# Patient Record
Sex: Female | Born: 1945 | Race: White | Marital: Married | Smoking: Former smoker
Health system: Southern US, Community
[De-identification: ages and names within clinical notes are randomized; demographics above are authoritative.]

## PROBLEM LIST (undated history)

## (undated) DIAGNOSIS — Z8489 Family history of other specified conditions: Secondary | ICD-10-CM

## (undated) DIAGNOSIS — F32A Depression, unspecified: Secondary | ICD-10-CM

## (undated) DIAGNOSIS — N3281 Overactive bladder: Secondary | ICD-10-CM

## (undated) DIAGNOSIS — F419 Anxiety disorder, unspecified: Secondary | ICD-10-CM

## (undated) DIAGNOSIS — K219 Gastro-esophageal reflux disease without esophagitis: Secondary | ICD-10-CM

## (undated) DIAGNOSIS — G4700 Insomnia, unspecified: Secondary | ICD-10-CM

## (undated) DIAGNOSIS — G4733 Obstructive sleep apnea (adult) (pediatric): Secondary | ICD-10-CM

## (undated) DIAGNOSIS — E039 Hypothyroidism, unspecified: Secondary | ICD-10-CM

## (undated) DIAGNOSIS — E559 Vitamin D deficiency, unspecified: Secondary | ICD-10-CM

## (undated) DIAGNOSIS — I10 Essential (primary) hypertension: Secondary | ICD-10-CM

## (undated) DIAGNOSIS — M1990 Unspecified osteoarthritis, unspecified site: Secondary | ICD-10-CM

## (undated) DIAGNOSIS — E785 Hyperlipidemia, unspecified: Secondary | ICD-10-CM

## (undated) HISTORY — DX: Anxiety disorder, unspecified: F41.9

## (undated) HISTORY — PX: EYE SURGERY: SHX253

## (undated) HISTORY — DX: Obstructive sleep apnea (adult) (pediatric): G47.33

## (undated) HISTORY — DX: Unspecified osteoarthritis, unspecified site: M19.90

## (undated) HISTORY — DX: Vitamin D deficiency, unspecified: E55.9

## (undated) HISTORY — DX: Hyperlipidemia, unspecified: E78.5

## (undated) HISTORY — DX: Essential (primary) hypertension: I10

## (undated) HISTORY — DX: Gastro-esophageal reflux disease without esophagitis: K21.9

## (undated) HISTORY — DX: Insomnia, unspecified: G47.00

## (undated) HISTORY — PX: COLONOSCOPY: SHX174

## (undated) HISTORY — PX: OTHER SURGICAL HISTORY: SHX169

---

## 1979-03-24 HISTORY — PX: TUBAL LIGATION: SHX77

## 1998-03-23 DIAGNOSIS — I10 Essential (primary) hypertension: Secondary | ICD-10-CM | POA: Insufficient documentation

## 1998-06-21 ENCOUNTER — Other Ambulatory Visit: Admission: RE | Admit: 1998-06-21 | Discharge: 1998-06-21 | Payer: Self-pay | Admitting: Obstetrics and Gynecology

## 1999-09-15 ENCOUNTER — Other Ambulatory Visit: Admission: RE | Admit: 1999-09-15 | Discharge: 1999-09-15 | Payer: Self-pay | Admitting: Obstetrics and Gynecology

## 2000-12-28 ENCOUNTER — Other Ambulatory Visit: Admission: RE | Admit: 2000-12-28 | Discharge: 2000-12-28 | Payer: Self-pay | Admitting: Family Medicine

## 2005-12-21 DIAGNOSIS — E782 Mixed hyperlipidemia: Secondary | ICD-10-CM | POA: Insufficient documentation

## 2005-12-21 DIAGNOSIS — E785 Hyperlipidemia, unspecified: Secondary | ICD-10-CM

## 2005-12-21 HISTORY — DX: Hyperlipidemia, unspecified: E78.5

## 2006-05-06 DIAGNOSIS — G4733 Obstructive sleep apnea (adult) (pediatric): Secondary | ICD-10-CM | POA: Insufficient documentation

## 2006-05-13 ENCOUNTER — Emergency Department: Payer: Self-pay | Admitting: Emergency Medicine

## 2006-05-14 ENCOUNTER — Other Ambulatory Visit: Payer: Self-pay

## 2006-05-14 IMAGING — CT CT CHEST W/ CM
2 of 3 series · 17 of 31 positions shown, 20 images · IV contrast (APPLIED)
Comparison: none

REASON FOR EXAM: Fever, Nonproductive Cough LE swelling
COMMENTS:

[Series 4: soft tissue · axial · 0.71mm/px · z∈[-317,-98]mm · 8 of 95 slices shown]
[im 11/95  mediastinal]
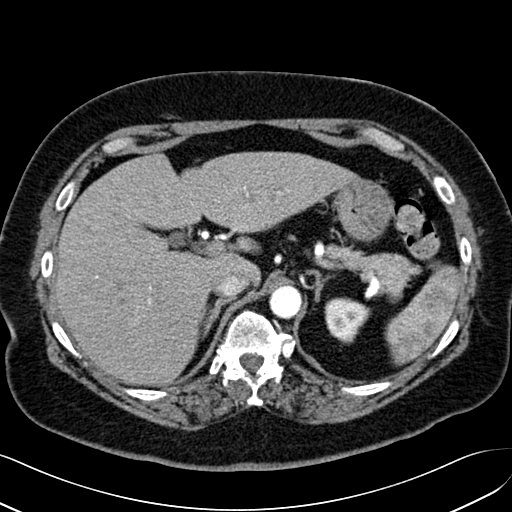
[im 21/95  mediastinal]
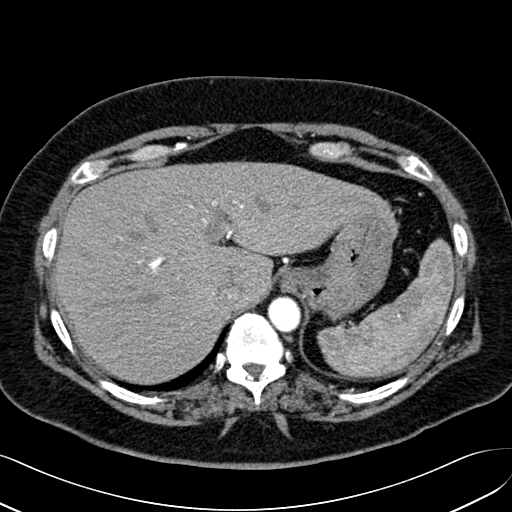
[im 32/95  mediastinal]
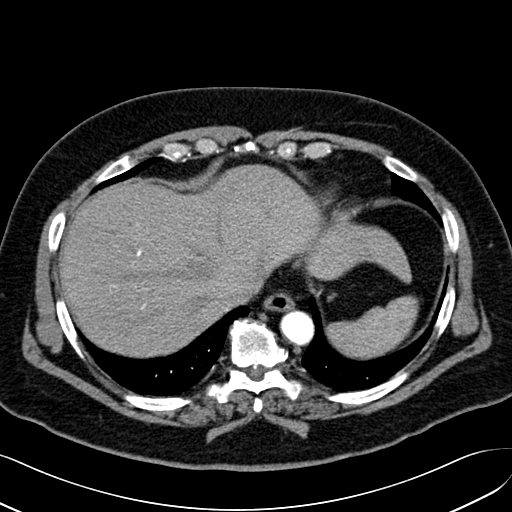
[im 42/95  mediastinal]
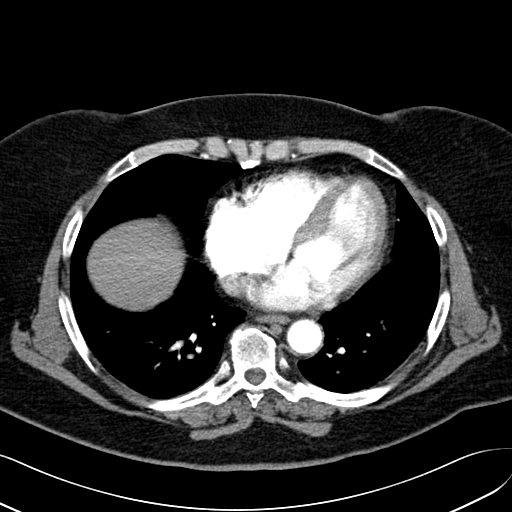
[im 53/95  mediastinal]
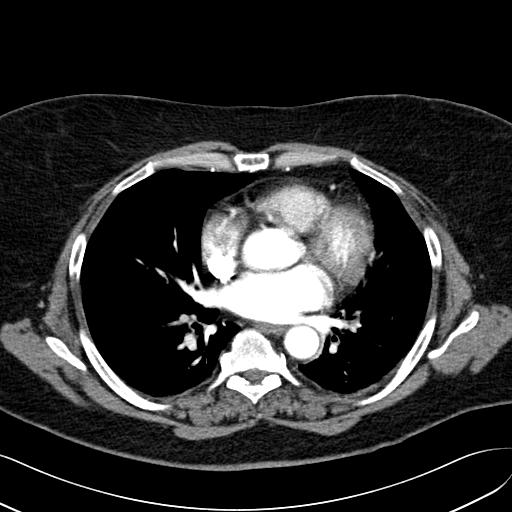
[im 63/95  mediastinal]
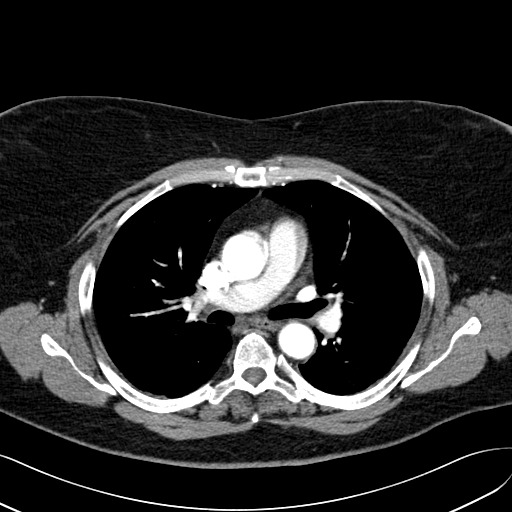
[im 74/95  mediastinal]
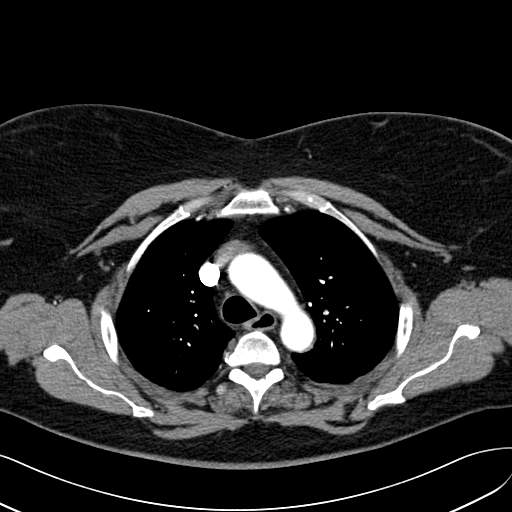
[im 84/95  mediastinal]
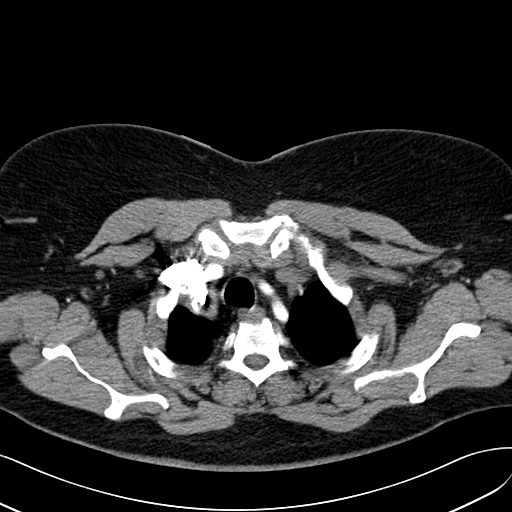

[Series 5: lung windows · axial · 0.71mm/px · z∈[-311,-98]mm · 9 of 93 slices shown, 12 images]
[im 11/93  mediastinal]
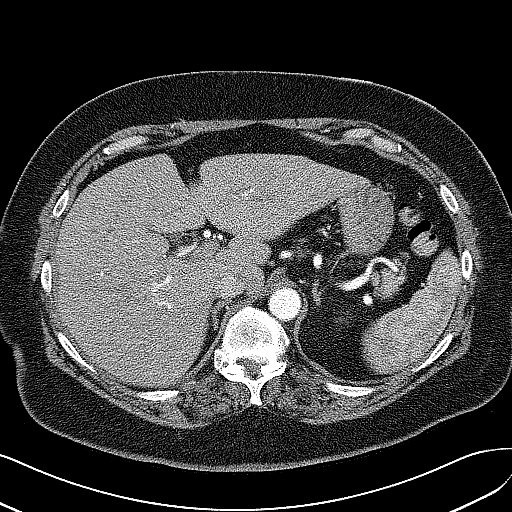
[im 11/93  lung]
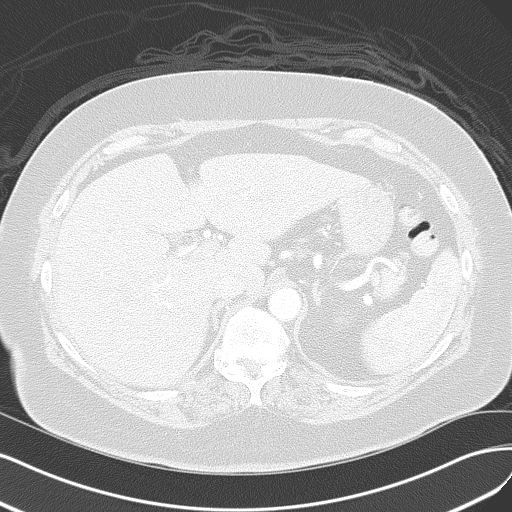
[im 21/93  lung]
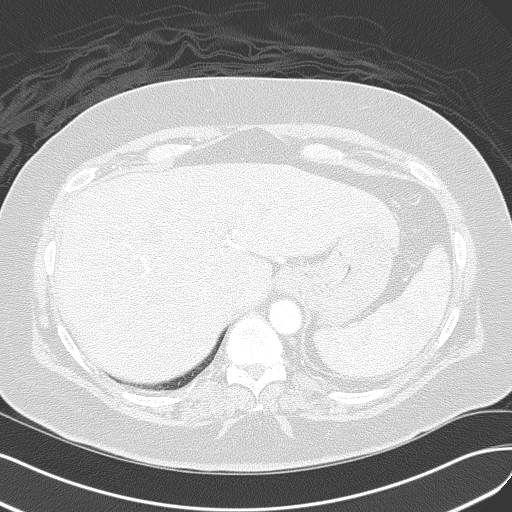
[im 31/93  lung]
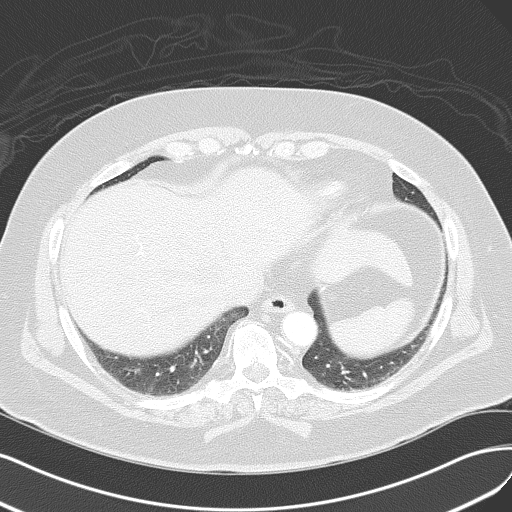
[im 41/93  lung]
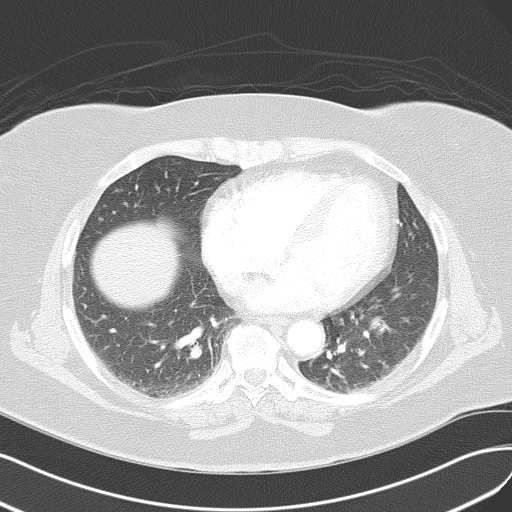
[im 43/93  mediastinal]
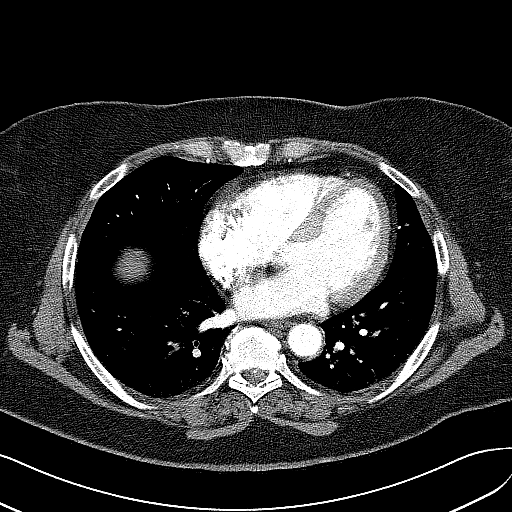
[im 43/93  lung]
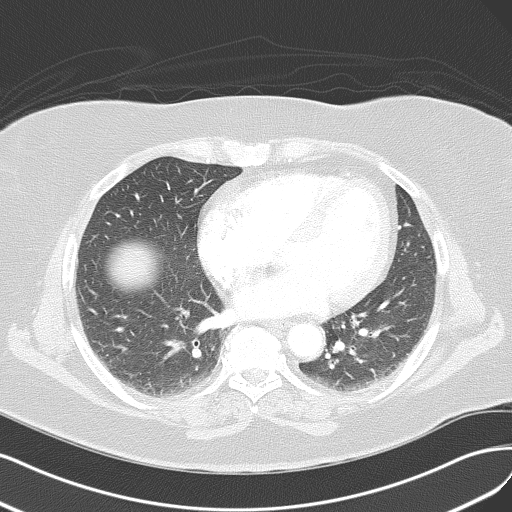
[im 52/93  lung]
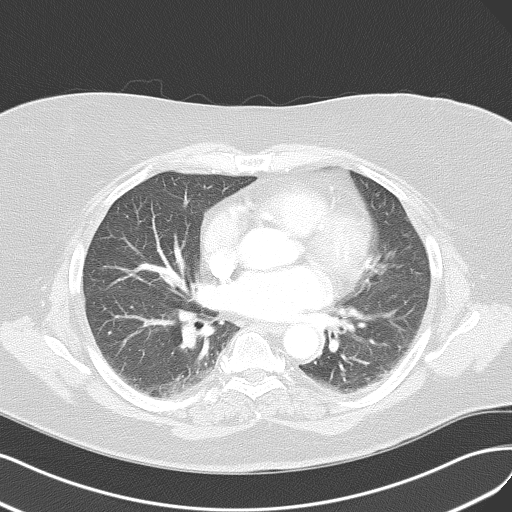
[im 62/93  lung]
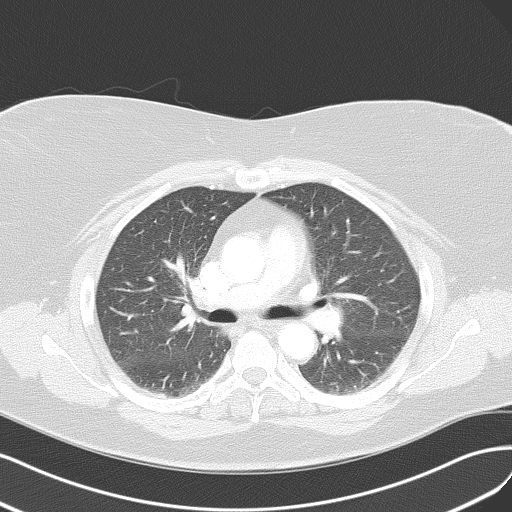
[im 72/93  lung]
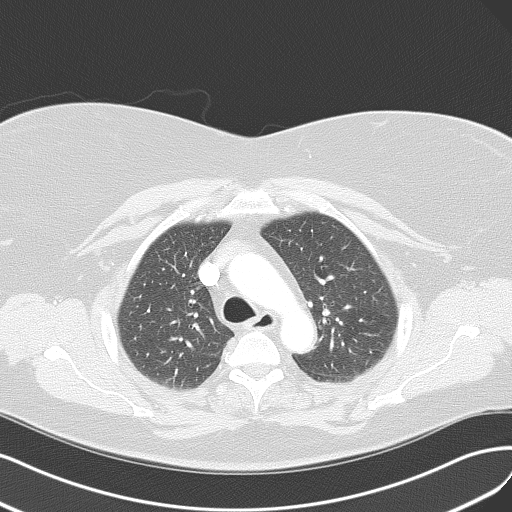
[im 82/93  mediastinal]
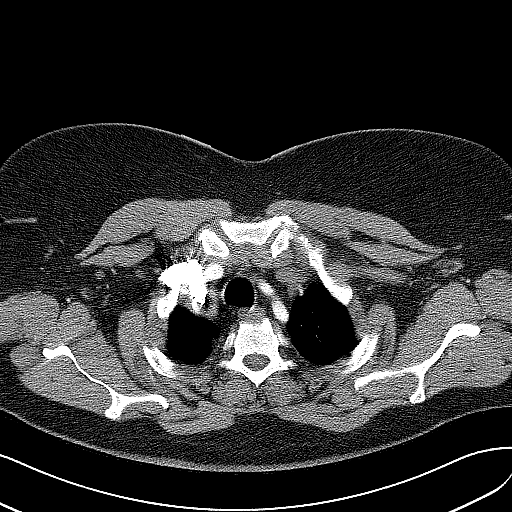
[im 82/93  lung]
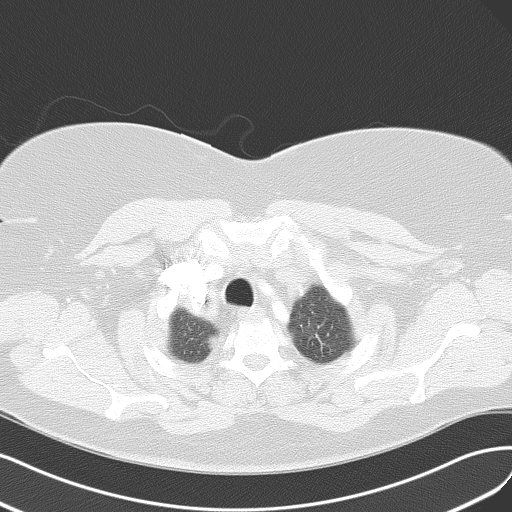

[17 of 31 positions shown; findings below may reference images not displayed]

PROCEDURE:     CT  - CT CHEST (FOR PE) W  - [DATE]  [DATE]

RESULT:     Emergent IV contrast-enhanced CT scan of the chest for pulmonary
embolism is performed. The study had to be repeated because of somewhat less
than optimal contrast bolus. The repeated images show normal opacification
of the pulmonary arteries. There is no pleural effusion. There is no
pericardial effusion. The upper abdominal viscera appear to be grossly
normal. The adrenal glands are not completely included. There is no
infiltrate, nodule or pneumothorax.
IMPRESSION: 1. No PE.
2. The thoracic aorta is unremarkable.

## 2006-05-14 IMAGING — US US EXTREM LOW VENOUS*L*
1 series · 17 of 24 positions shown · non-contrast
Comparison: none

REASON FOR EXAM: left leg pain
COMMENTS:

[Series 1: us extrem low venous*left* · 17 of 25 slices shown]
[im 1/25]
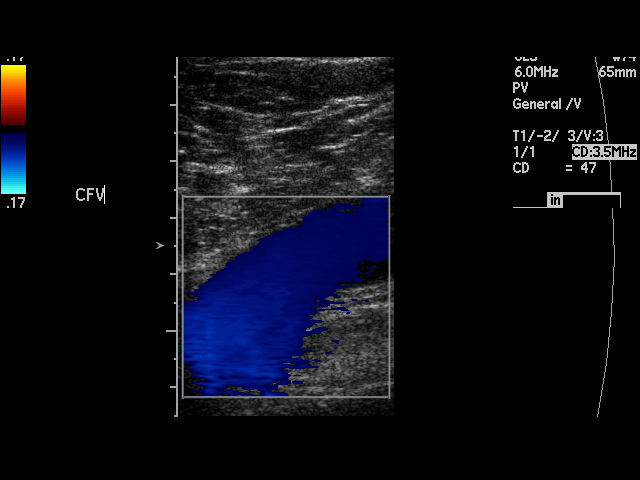
[im 3/25]
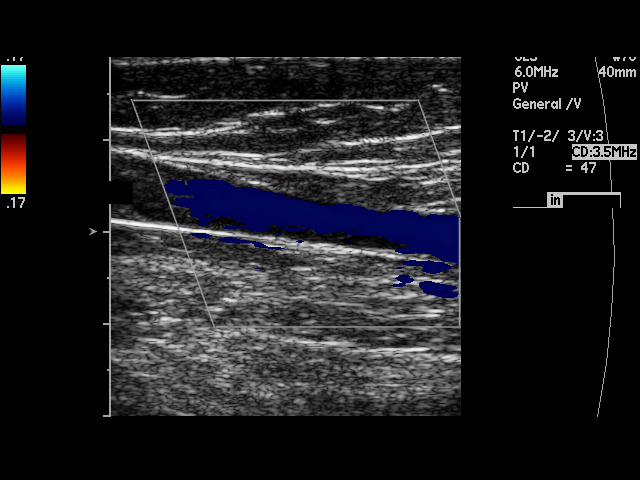
[im 4/25]
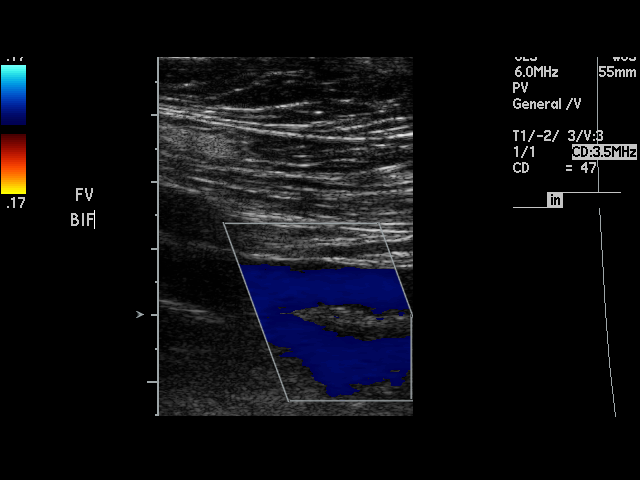
[im 5/25]
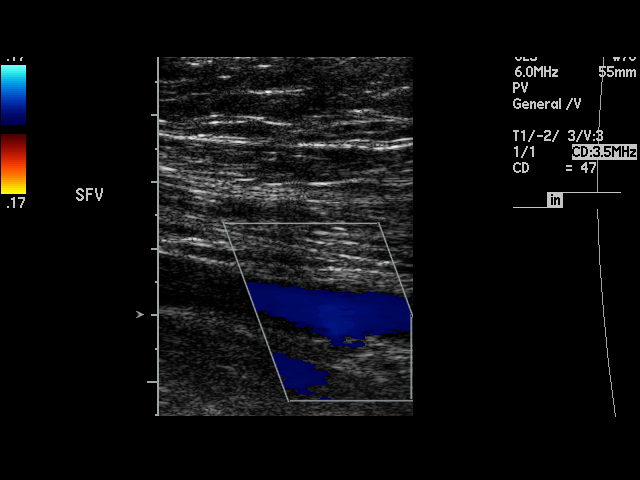
[im 7/25]
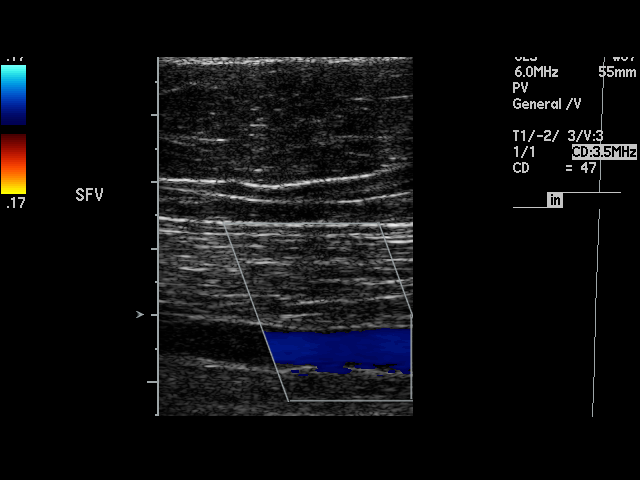
[im 8/25]
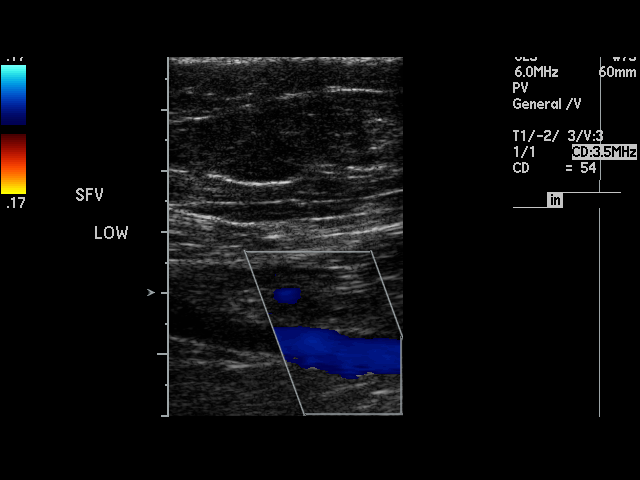
[im 10/25]
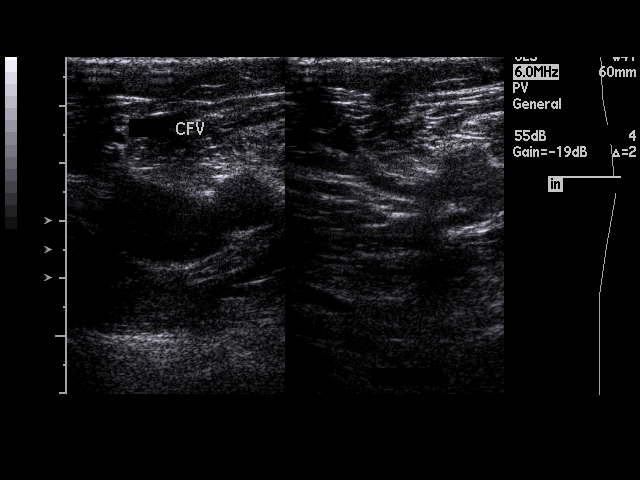
[im 11/25]
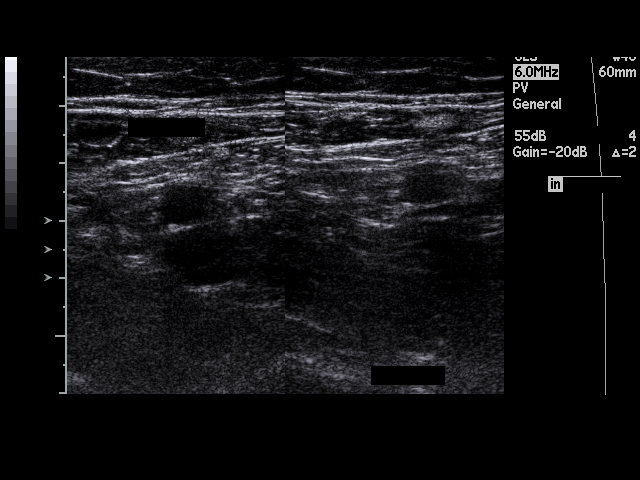
[im 13/25]
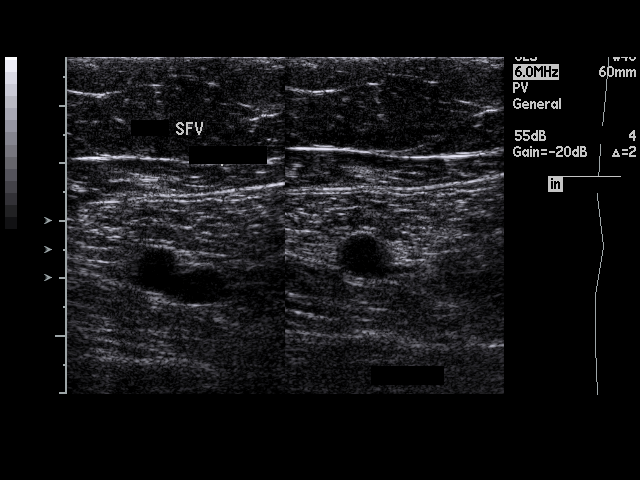
[im 14/25]
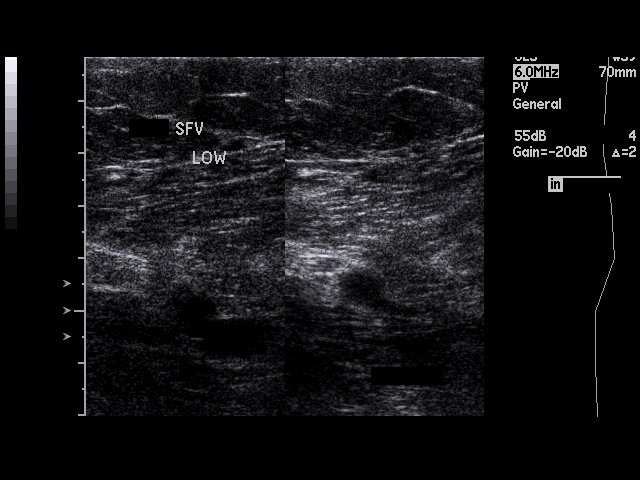
[im 15/25]
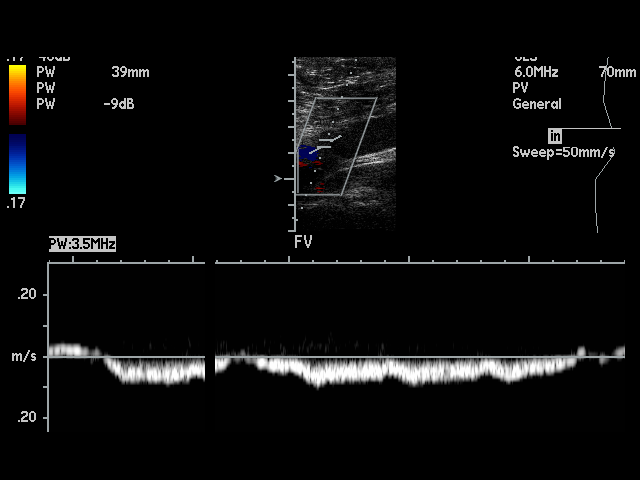
[im 17/25]
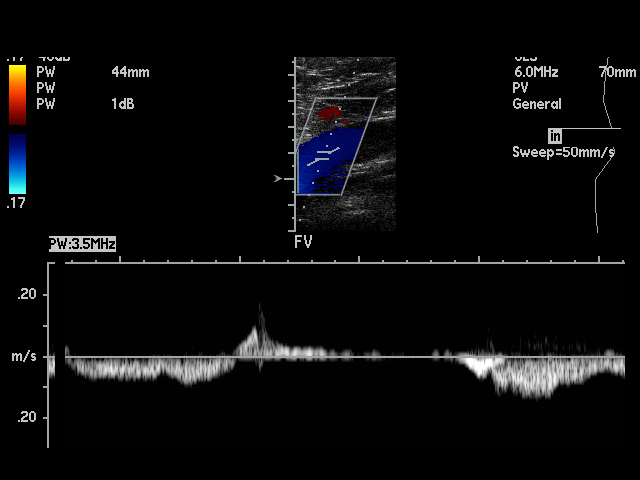
[im 18/25]
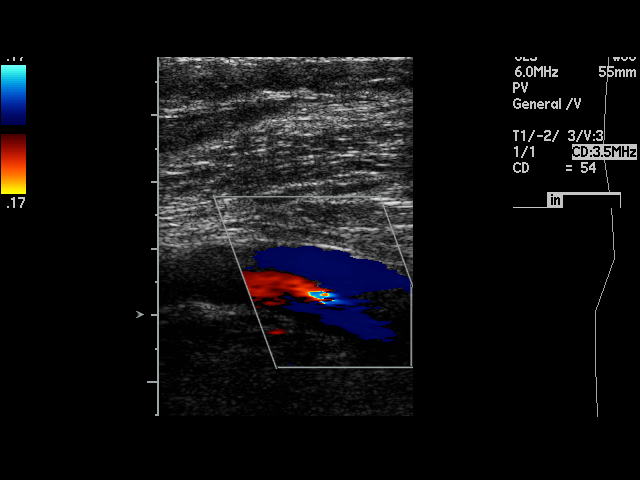
[im 20/25]
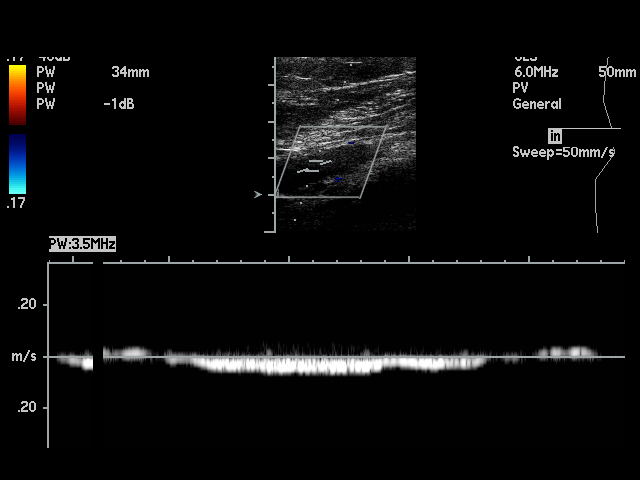
[im 21/25]
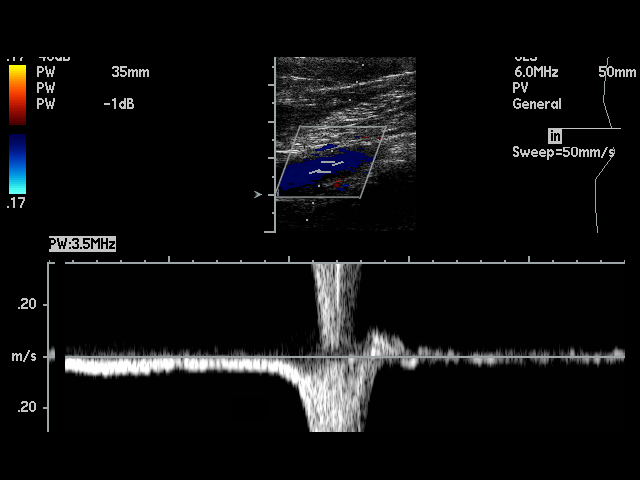
[im 22/25]
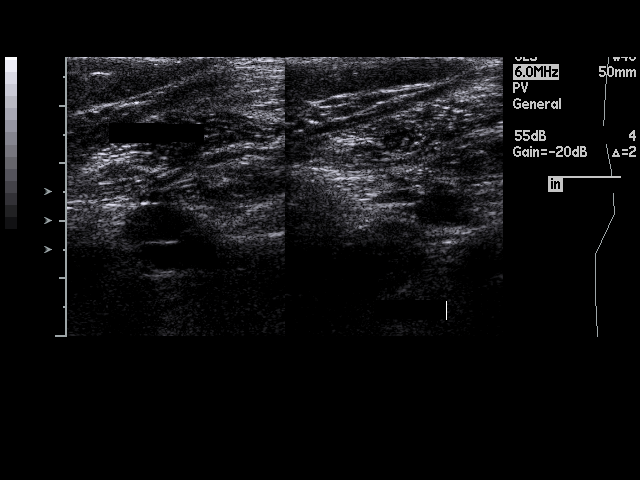
[im 25/25]
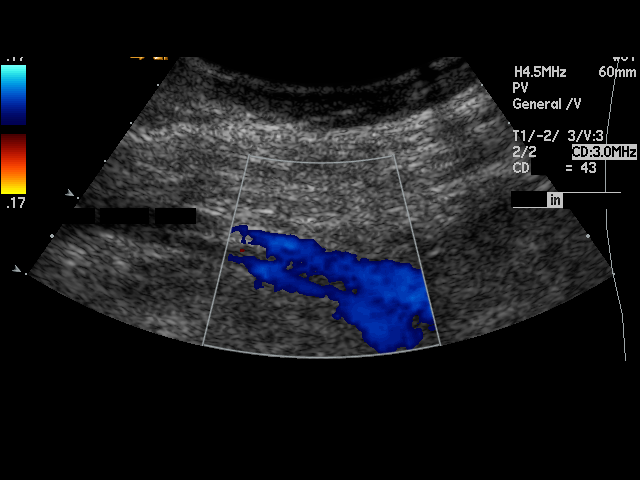

[17 of 24 positions shown; findings below may reference images not displayed]

PROCEDURE:     US  - US DOPPLER LOW EXTR LEFT  - [DATE]  [DATE]

RESULT:     Duplex Doppler interrogation of the deep venous system of the
left leg from the inguinal to the popliteal region shows normal
compressibility with a normal color and spectral Doppler appearance. There
are no findings to suggest a diagnosis of left lower extremity DVT.
IMPRESSION: 1. No DVT in the left lower extremity.

## 2007-02-09 DIAGNOSIS — E669 Obesity, unspecified: Secondary | ICD-10-CM | POA: Insufficient documentation

## 2007-02-12 DIAGNOSIS — Z8249 Family history of ischemic heart disease and other diseases of the circulatory system: Secondary | ICD-10-CM | POA: Insufficient documentation

## 2007-04-05 ENCOUNTER — Ambulatory Visit: Payer: Self-pay | Admitting: Family Medicine

## 2007-08-23 ENCOUNTER — Ambulatory Visit: Payer: Self-pay | Admitting: Family Medicine

## 2007-08-23 IMAGING — CT CT NECK-CHEST W/ CM
2 series · 10 of 14 positions shown, 12 images · non-contrast
Comparison: none

REASON FOR EXAM: right supraclavical nodule  HX axillary nodules on mammo
COMMENTS:

PROCEDURE:     CT  - CT NECK AND CHEST W  - [DATE]  [DATE]
RESULT:     Comparison: Chest CT on [DATE].
TECHNIQUE: CT examination of the neck and chest was performed after
intravenous administration of 80 ml of [UB] nonionic contrast.
Collimation is 3 mm.

[Series 2: soft tissue · axial · 0.69mm/px · z∈[+406,+748]mm · 7 of 153 slices shown, 9 images]
[im 20/153  soft-tissue]
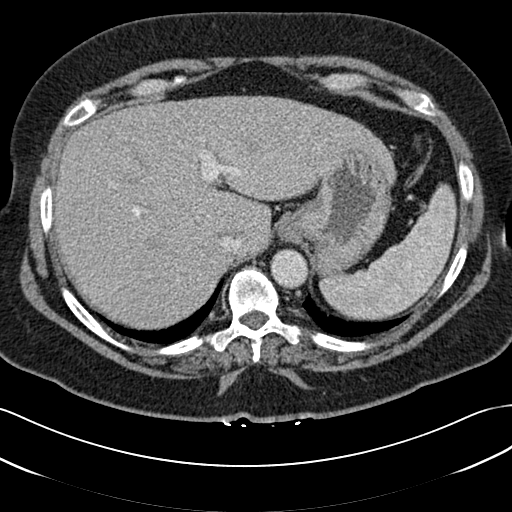
[im 20/153  bone]
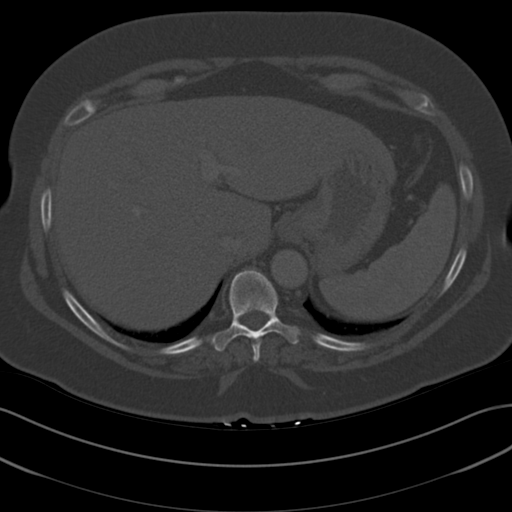
[im 39/153  bone]
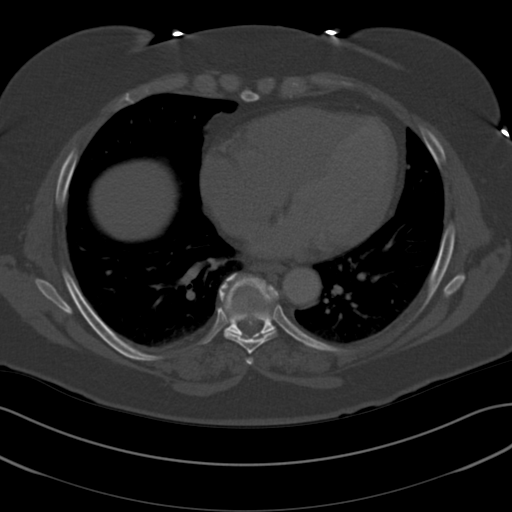
[im 58/153  bone]
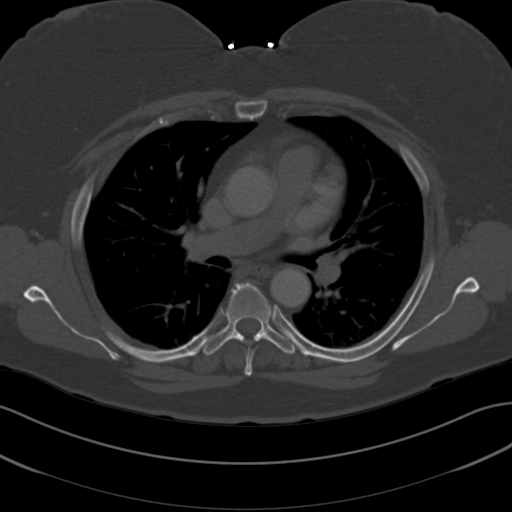
[im 77/153  bone]
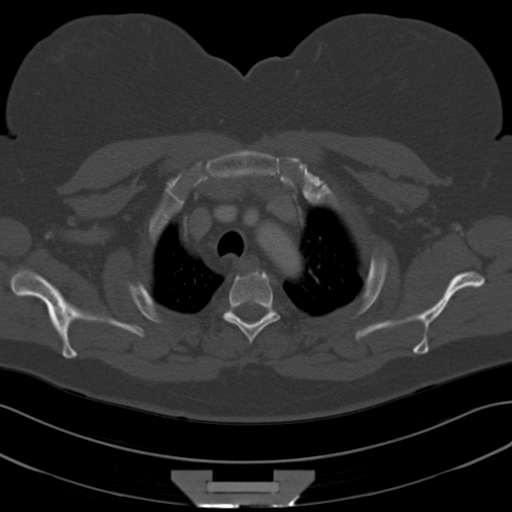
[im 96/153  soft-tissue]
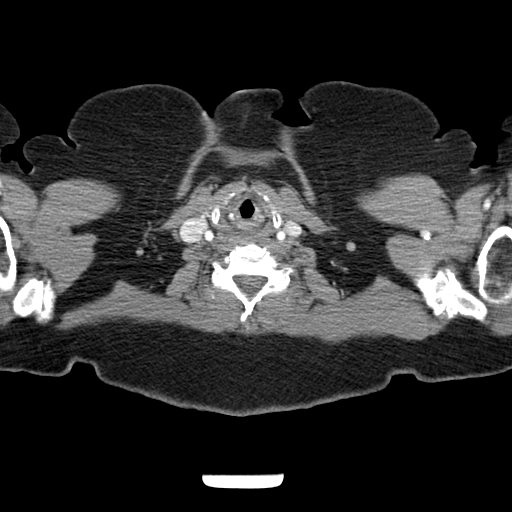
[im 96/153  bone]
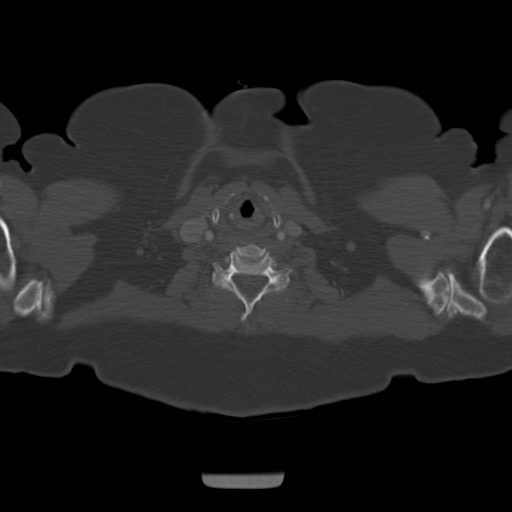
[im 115/153  bone]
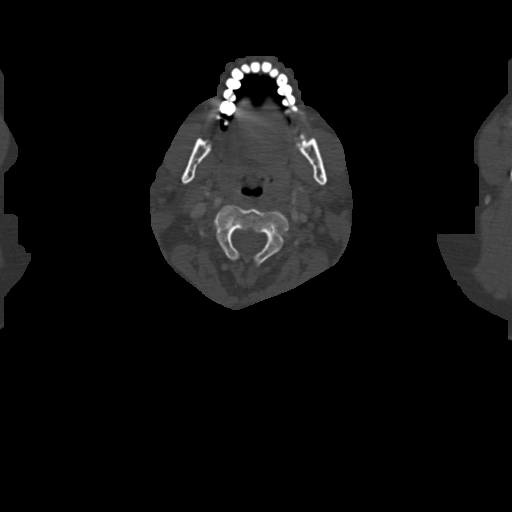
[im 134/153  bone]
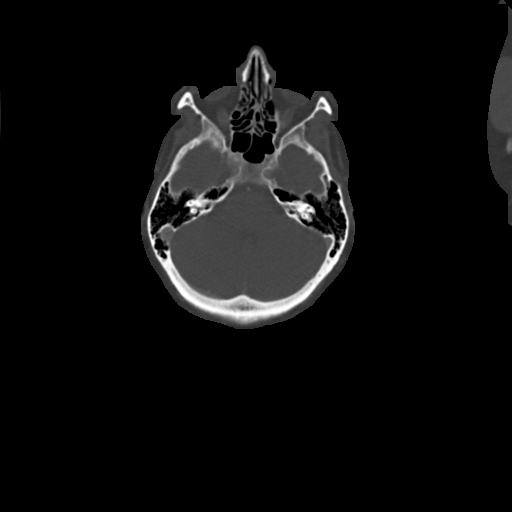

[Series 4: lung windows · axial · 0.69mm/px · z∈[+417,+537]mm · 3 of 82 slices shown]
[im 21/82  bone]
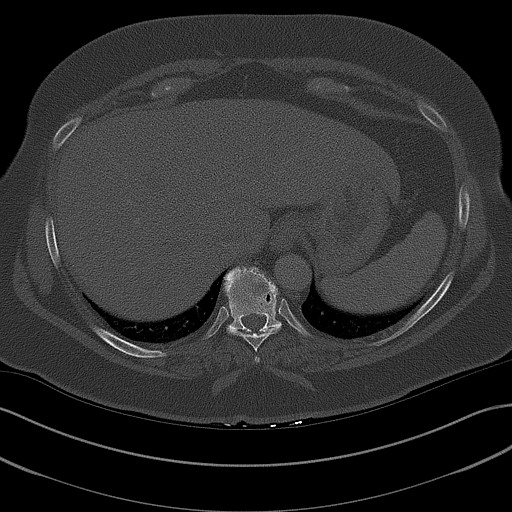
[im 41/82  bone]
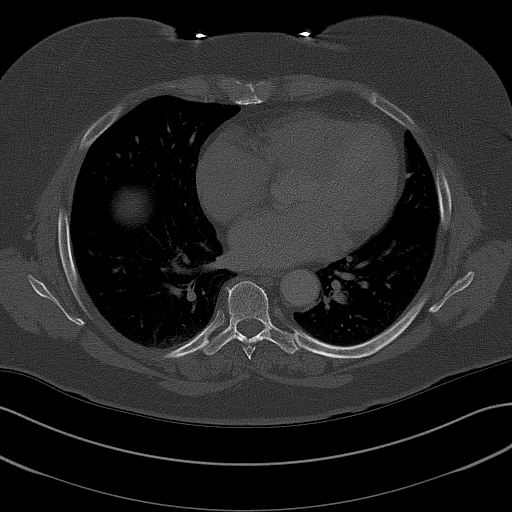
[im 61/82  bone]
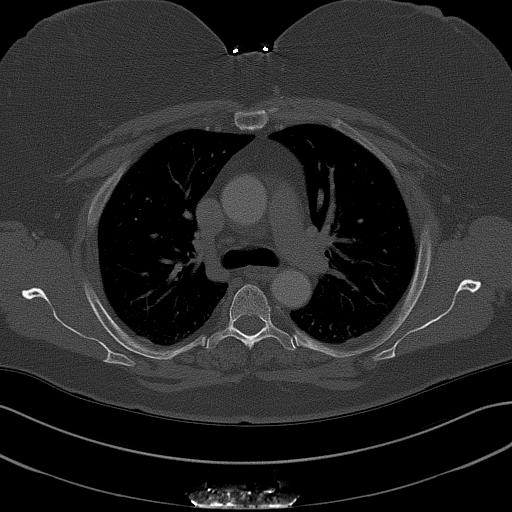

[10 of 14 positions shown; findings below may reference images not displayed]

FINDINGS: Neck:

No neck mass is seen. The naso and oropharyngeal mucosal spaces are
unremarkable.  There is no abnormal fluid collection.There are no enlarged
neck lymph nodes. The paranasal sinuses and mastoid air cells are
unremarkable.

Visualized portions of the carotid arteries and internal jugular veins are
patent.

Chest:

The lungs are clear. There is no pleural or pericardial effusion. There is
no pneumothorax. There are no enlarged mediastinal, hilar, or axillary lymph
nodes.

No grossly suspicious lytic or blastic osseous lesion is seen. Mild to
moderate degenerative changes are seen involving the visualized cervical and
thoracic spine as well as the bilateral AC joints.
IMPRESSION: 1. No neck or thoracic lymphadenopathy. No neck mass is seen.

2. Mild to moderate degenerative changes are seen involving the visualized
cervical and thoracic spine. Multilevel posterior spurring involving the mid
to lower thoracic spine likely causes some canal narrowing.

## 2009-04-17 ENCOUNTER — Ambulatory Visit: Payer: Self-pay

## 2010-06-14 ENCOUNTER — Ambulatory Visit: Payer: Self-pay | Admitting: Family Medicine

## 2010-06-14 IMAGING — CR DG SHOULDER 3+V*R*
1 series · 4 of 4 positions shown · non-contrast
Comparison: none

REASON FOR EXAM: pain in rt shld after fall
COMMENTS:

PROCEDURE:     DXR - DXR SHOULDER RIGHT COMPLETE  - [DATE] [DATE]
RESULT:     Comparison: None.

[Series 1: view not recorded · 0.17mm/px · 4 of 4 slices shown]
[im 1/4]
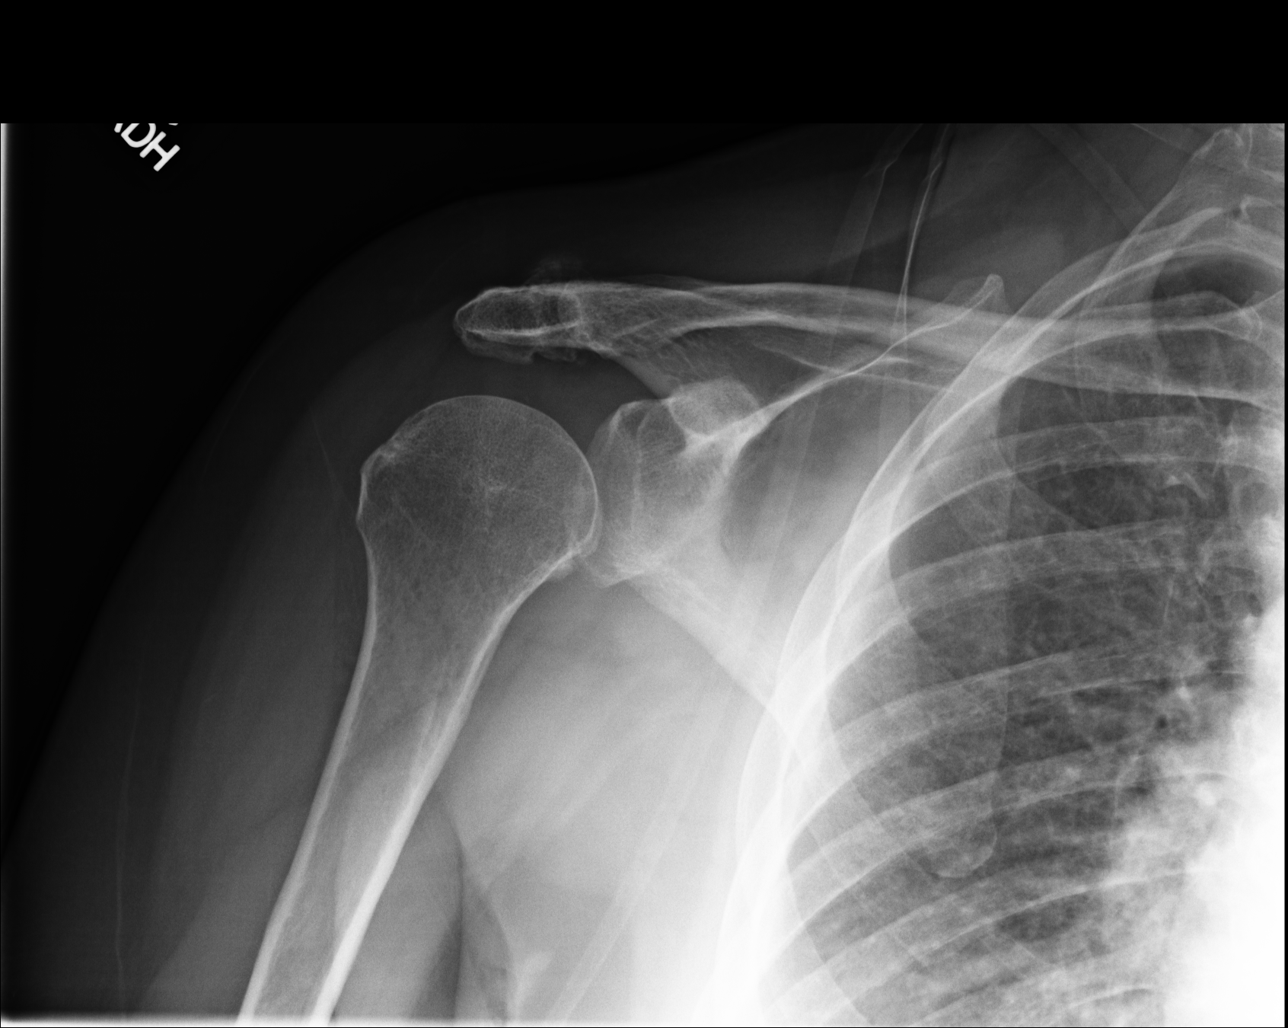
[im 2/4]
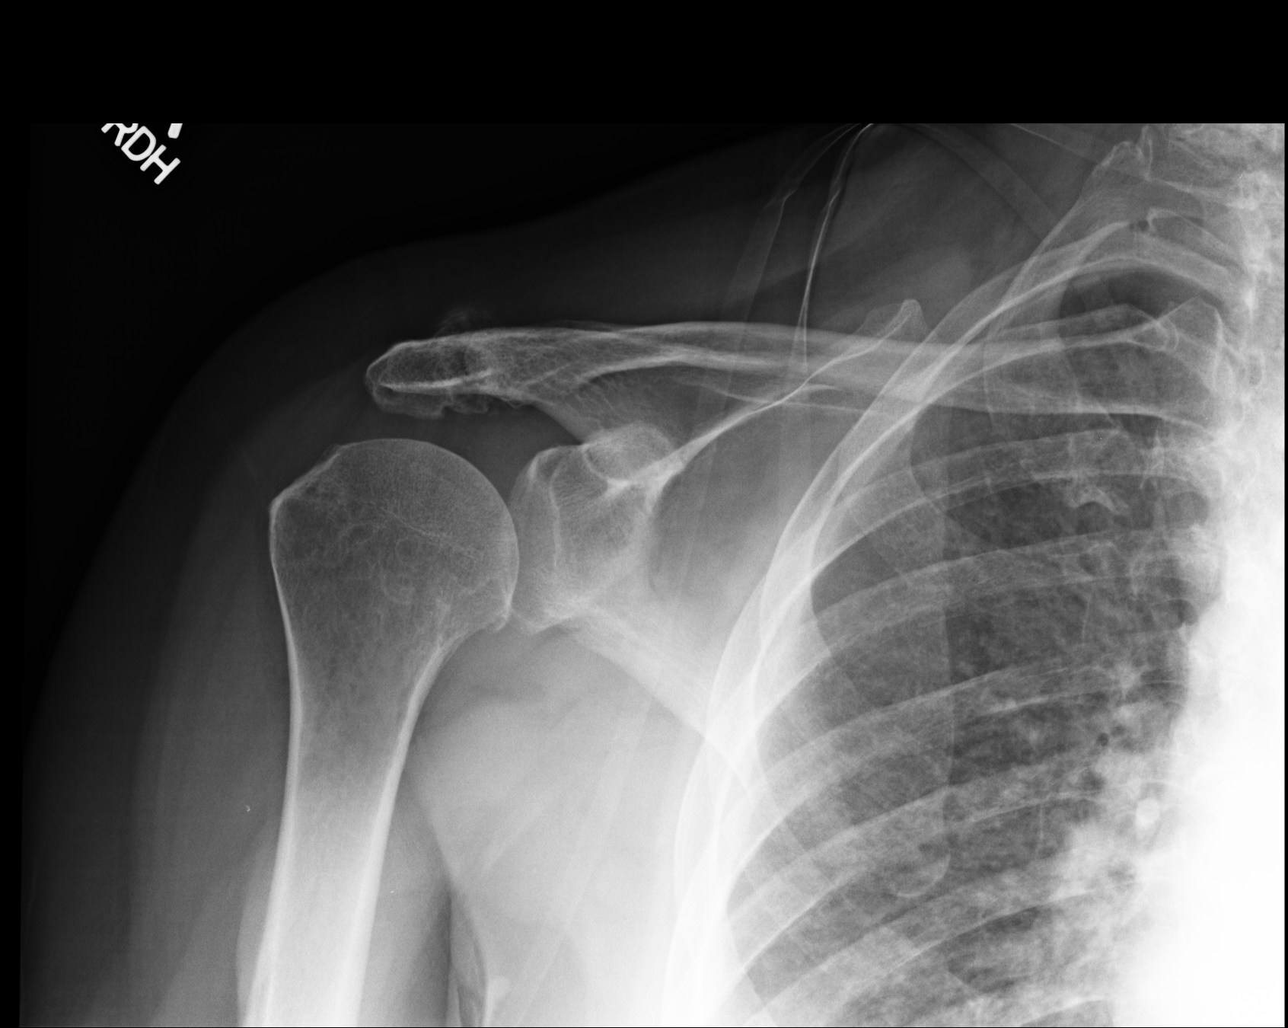
[im 3/4]
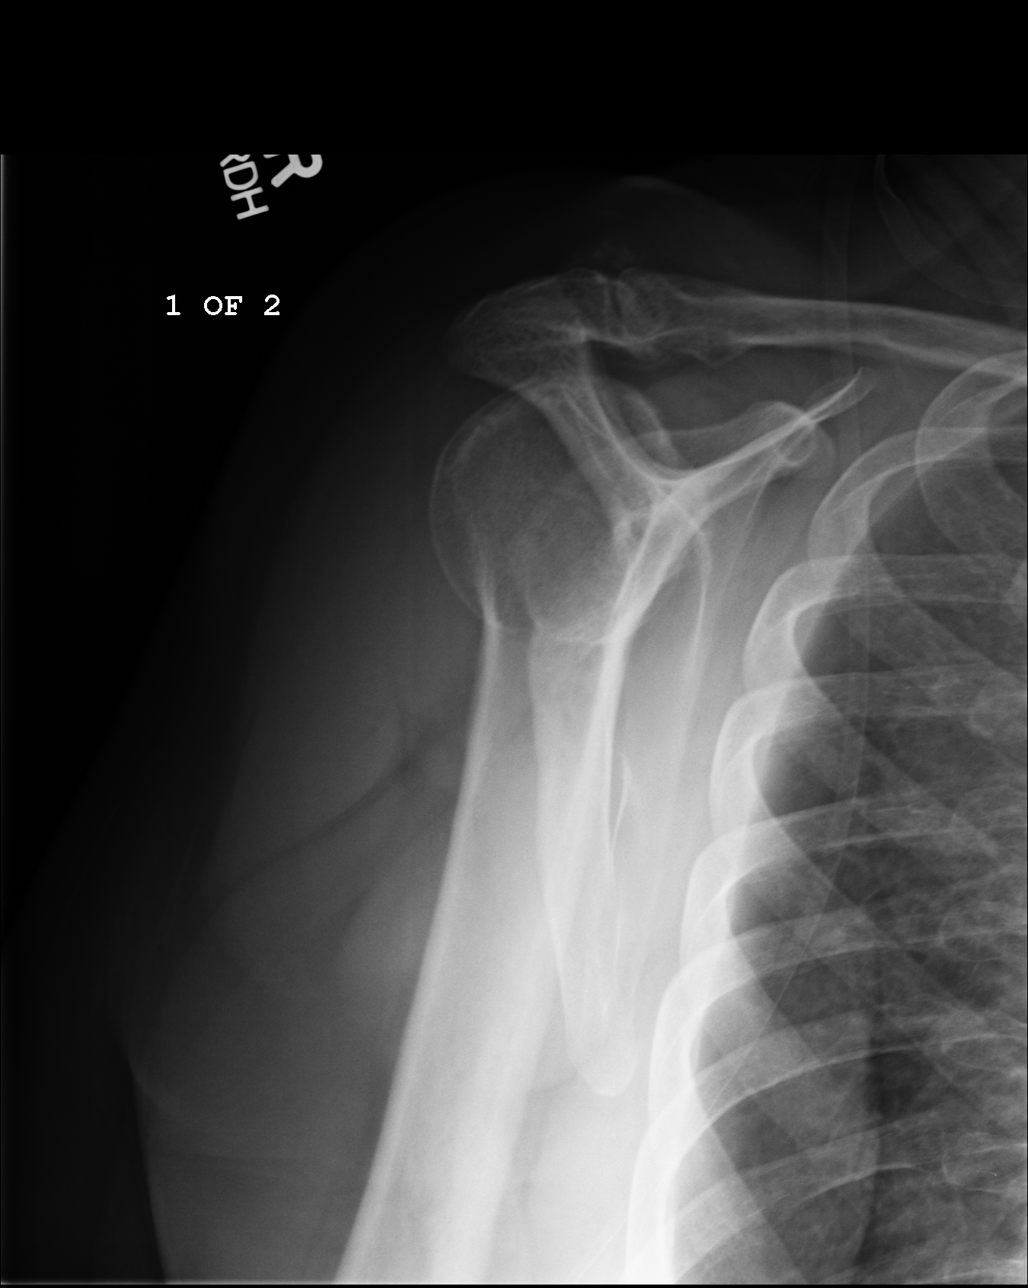
[im 4/4]
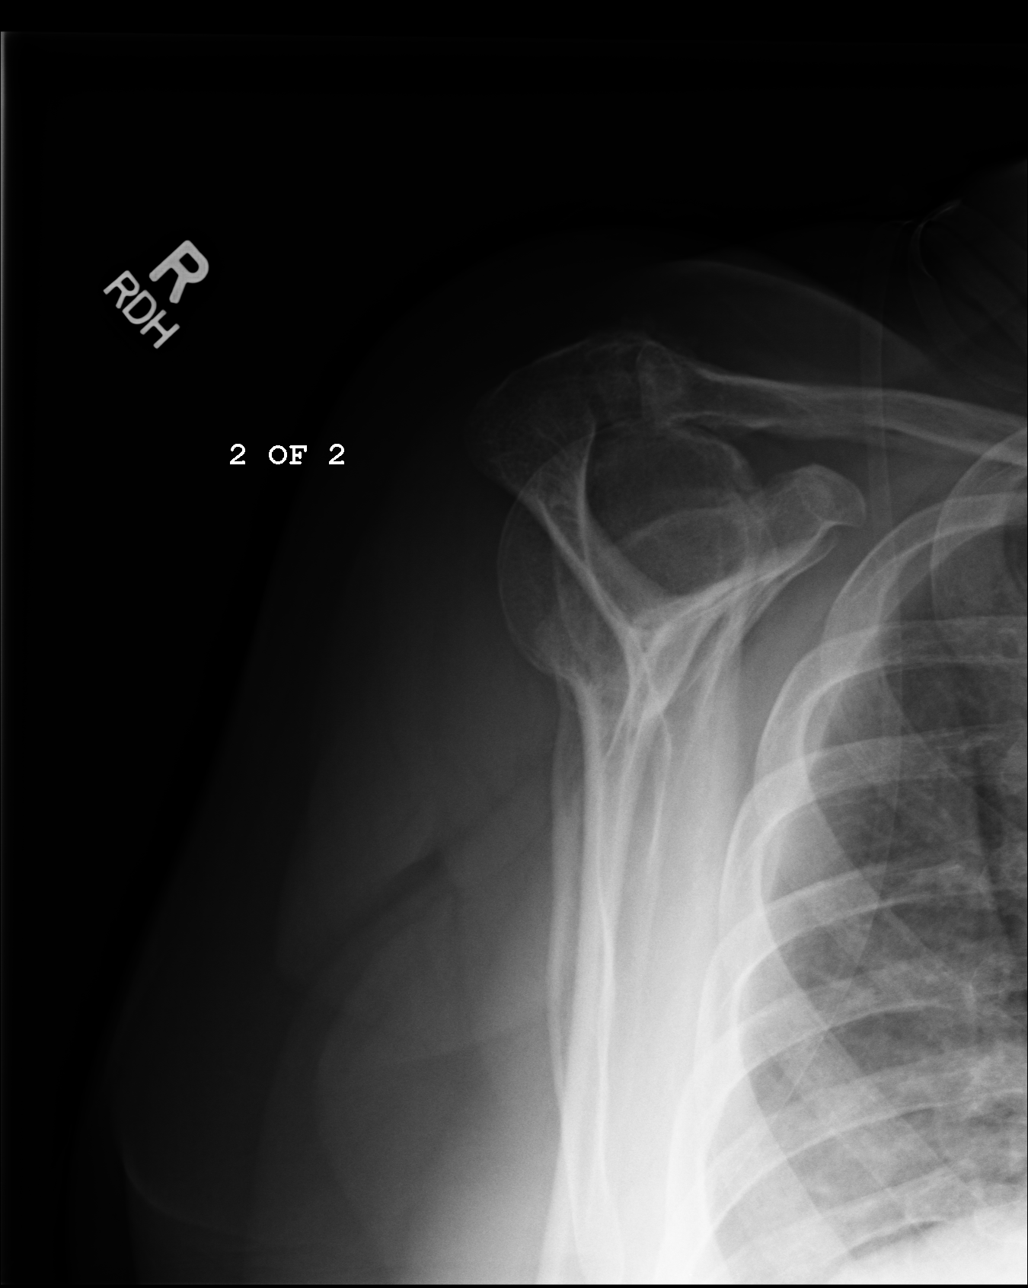

[4 of 4 positions shown; findings below may reference images not displayed]

FINDINGS: No acute fracture or dislocation. There is mild degenerative change of the
acromioclavicular and glenohumeral joints.
IMPRESSION: No acute fracture.

## 2010-07-15 ENCOUNTER — Ambulatory Visit: Payer: Self-pay | Admitting: Ophthalmology

## 2010-08-12 ENCOUNTER — Ambulatory Visit: Payer: Self-pay | Admitting: Ophthalmology

## 2010-12-11 ENCOUNTER — Ambulatory Visit: Payer: Self-pay

## 2011-05-01 ENCOUNTER — Ambulatory Visit: Payer: Self-pay | Admitting: Family Medicine

## 2012-05-02 ENCOUNTER — Inpatient Hospital Stay: Payer: Self-pay | Admitting: Internal Medicine

## 2012-05-02 LAB — COMPREHENSIVE METABOLIC PANEL
Albumin: 3.4 g/dL (ref 3.4–5.0)
Alkaline Phosphatase: 100 U/L (ref 50–136)
Anion Gap: 8 (ref 7–16)
Bilirubin,Total: 0.6 mg/dL (ref 0.2–1.0)
Creatinine: 0.69 mg/dL (ref 0.60–1.30)
EGFR (Non-African Amer.): >60
Glucose: 94 mg/dL (ref 65–99)
Osmolality: 275 (ref 275–301)
Potassium: 4 mmol/L (ref 3.5–5.1)
SGOT(AST): 27 U/L (ref 15–37)
SGPT (ALT): 28 U/L (ref 12–78)
Sodium: 137 mmol/L (ref 136–145)
Total Protein: 7.6 g/dL (ref 6.4–8.2)

## 2012-05-02 LAB — URINALYSIS, COMPLETE
Bilirubin,UR: NEGATIVE
Glucose,UR: NEGATIVE mg/dL (ref 0–75)
Ketone: NEGATIVE
RBC,UR: 385 /HPF (ref 0–5)
Squamous Epithelial: 6
WBC UR: 1596 /HPF (ref 0–5)

## 2012-05-02 LAB — CBC
MCH: 32.5 pg (ref 26.0–34.0)
MCHC: 34.2 g/dL (ref 32.0–36.0)
Platelet: 183 10*3/uL (ref 150–440)
RDW: 13.8 % (ref 11.5–14.5)

## 2012-05-02 LAB — RAPID INFLUENZA A&B ANTIGENS

## 2012-05-02 LAB — URIC ACID: Uric Acid: 3.7 mg/dL (ref 2.6–6.0)

## 2012-05-02 IMAGING — CR DG CHEST 2V
1 series · 2 of 2 positions shown · non-contrast
Comparison: none

REASON FOR EXAM: cough
COMMENTS:   May transport without cardiac monitor

PROCEDURE:     DXR - DXR CHEST PA (OR AP) AND LATERAL  - [DATE]  [DATE]
RESULT:     Comparison: None.

[Series 1: ap · 0.17mm/px · 2 of 2 slices shown]
[im 1/2]
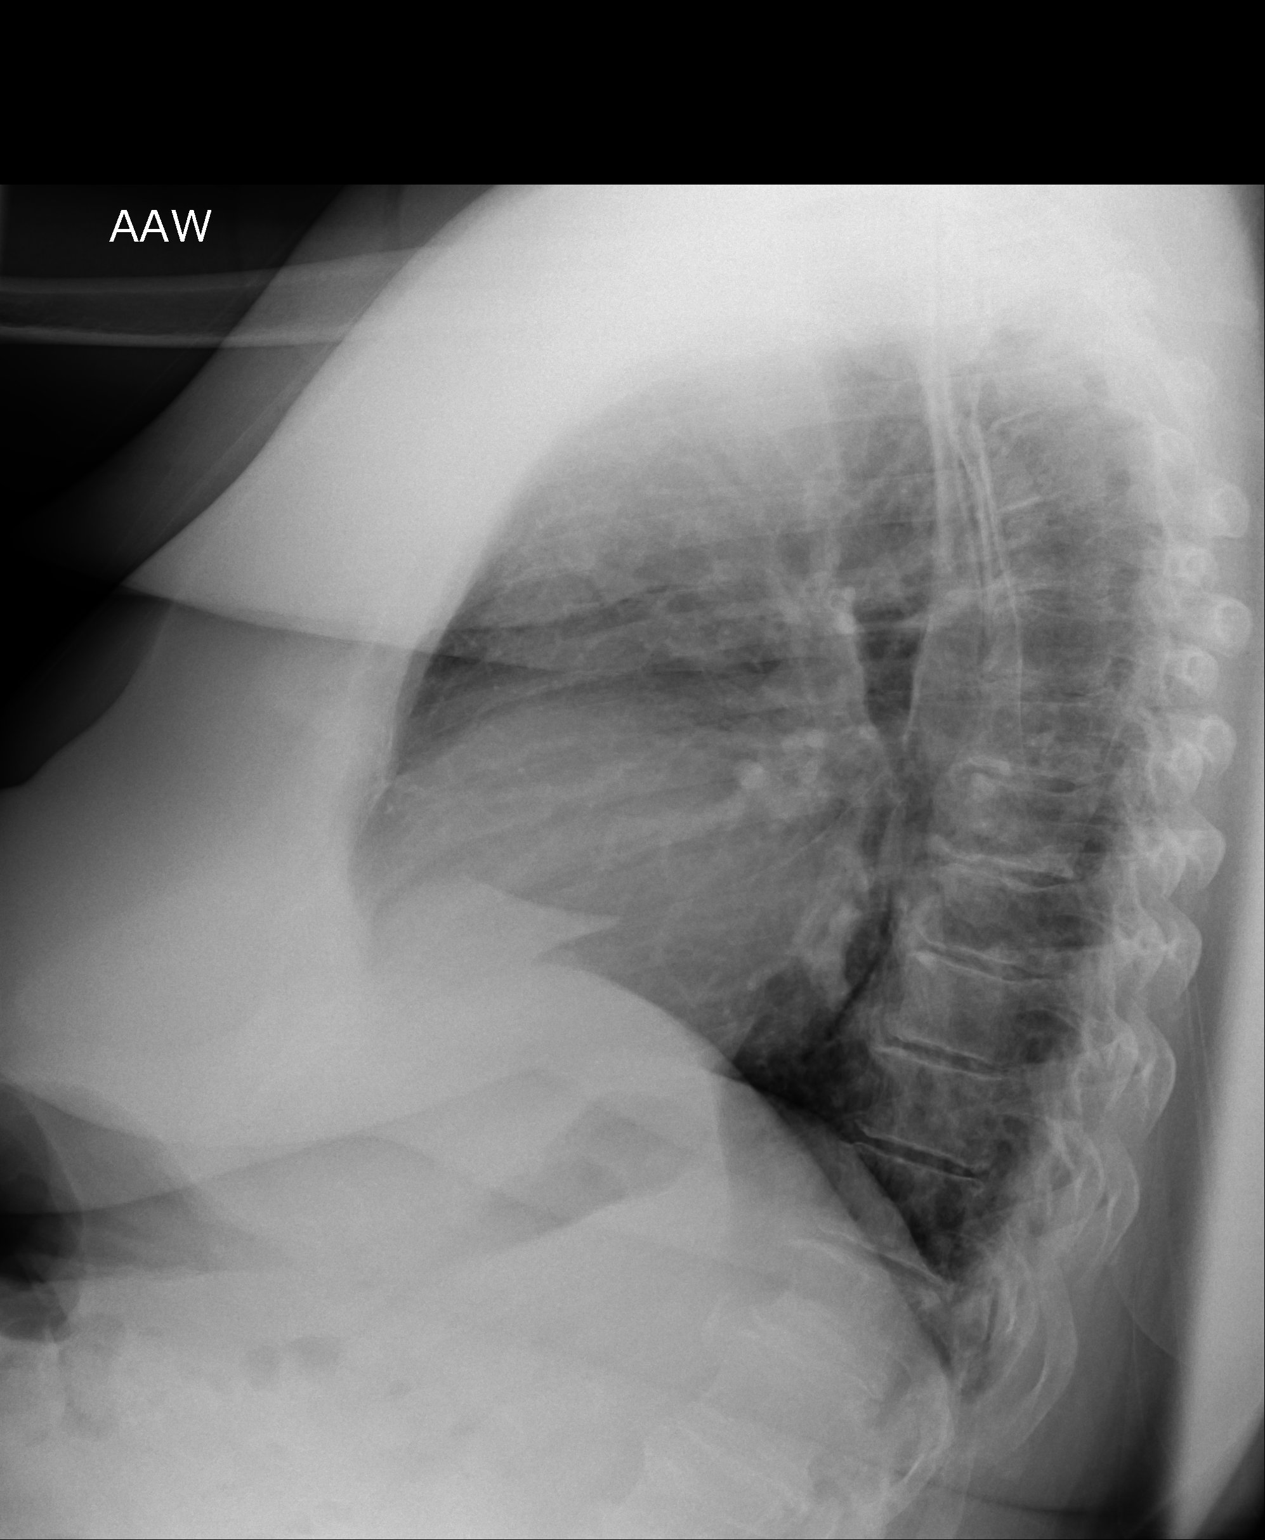
[im 2/2]
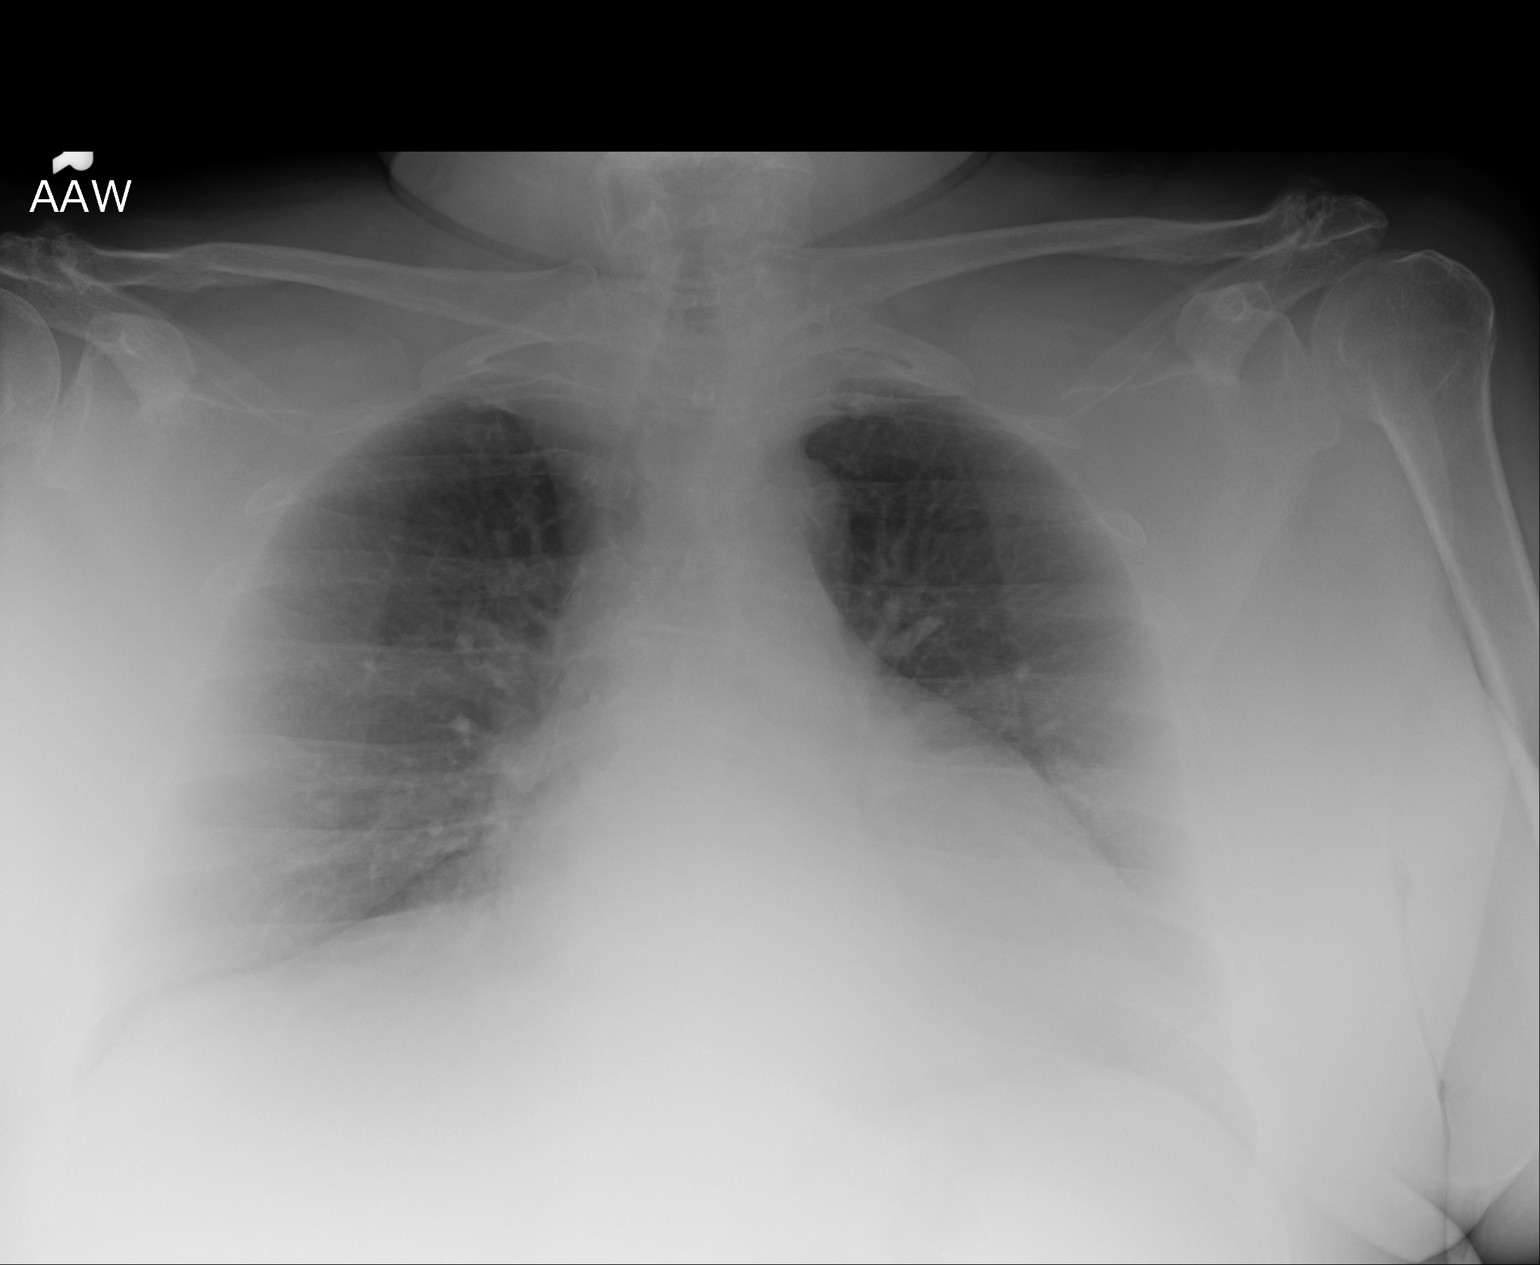

[2 of 2 positions shown; findings below may reference images not displayed]

FINDINGS: The heart is enlarged. The lung volumes are low. This is a relatively
lordotic view. No focal pulmonary opacities.
IMPRESSION: Slightly limited examination. Otherwise, no acute cardiopulmonary disease.

[REDACTED]

## 2012-05-02 IMAGING — CR DG KNEE COMPLETE 4+V*R*
1 series · 4 of 4 positions shown · non-contrast
Comparison: none

REASON FOR EXAM: severe knee pain
COMMENTS:

PROCEDURE:     DXR - DXR KNEE RT COMP WITH OBLIQUES  - [DATE]  [DATE]
RESULT:     Comparison: None.

[Series 1: oblique · 0.17mm/px · 4 of 4 slices shown]
[im 1/4]
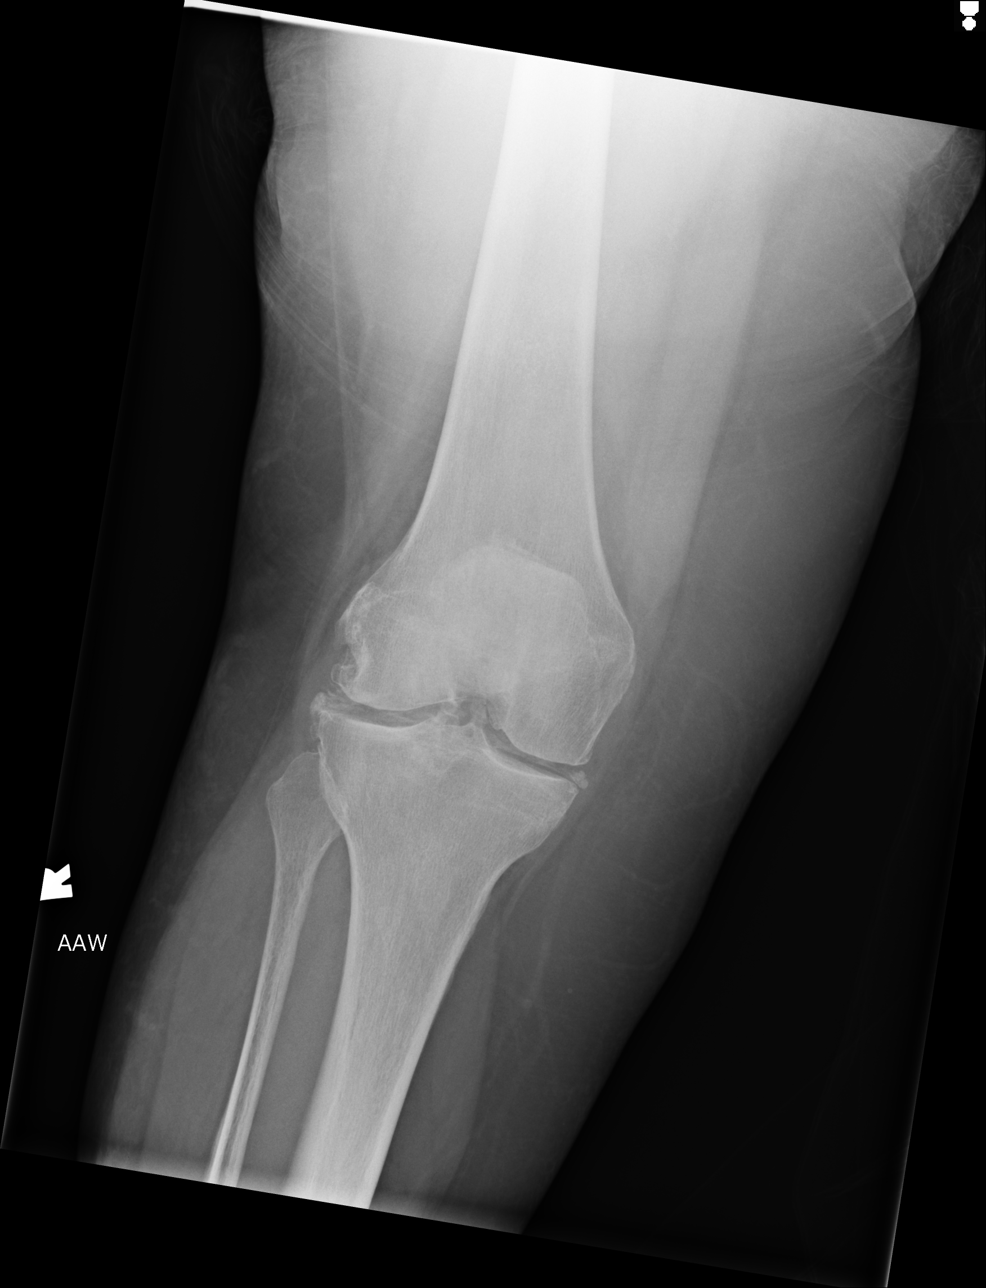
[im 2/4]
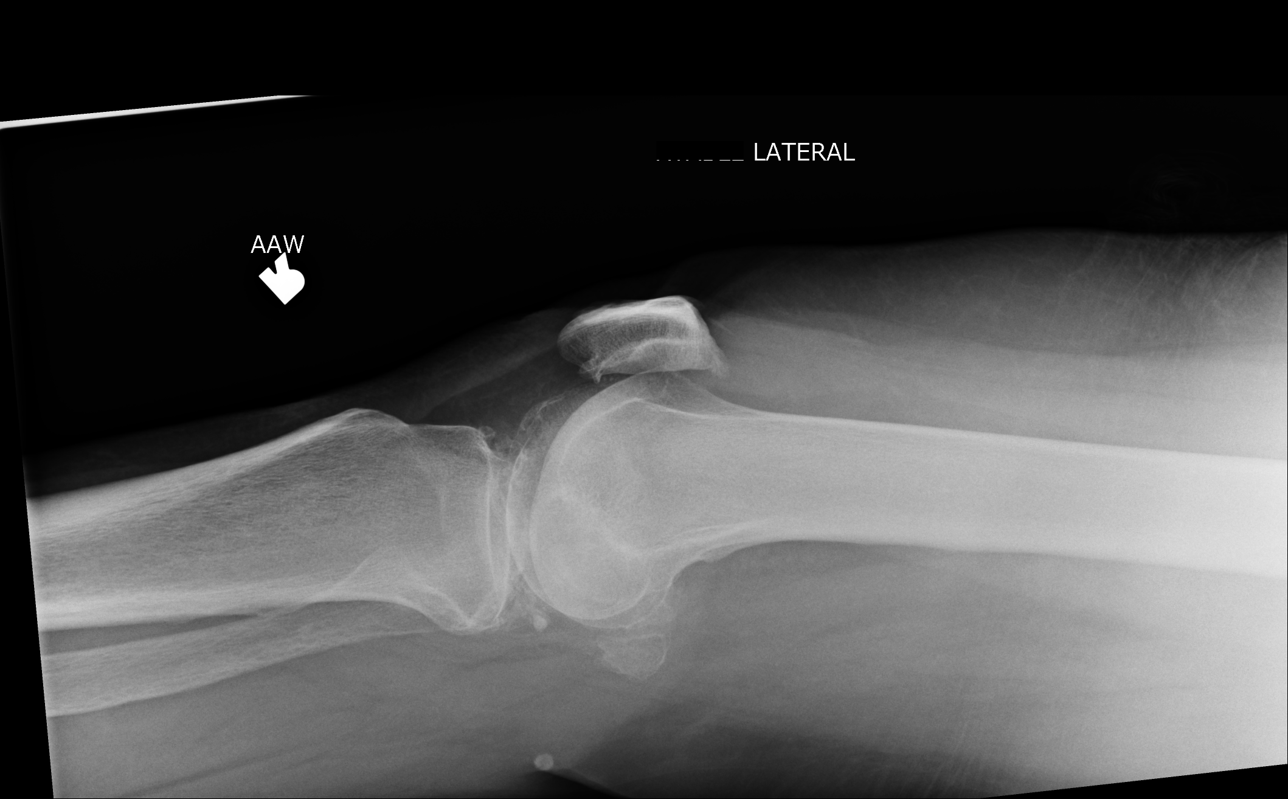
[im 3/4]
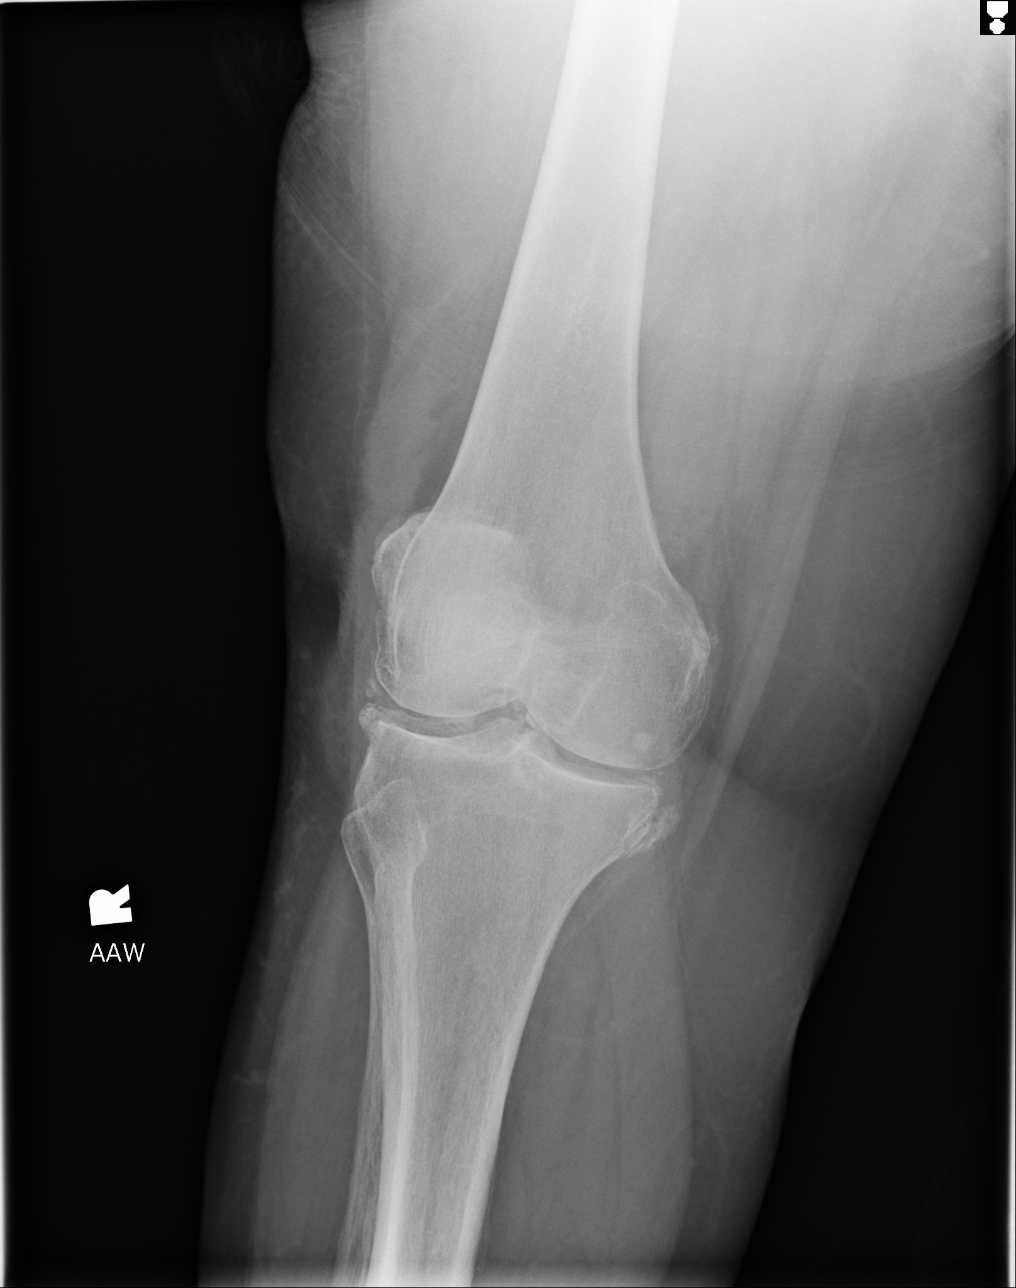
[im 4/4]
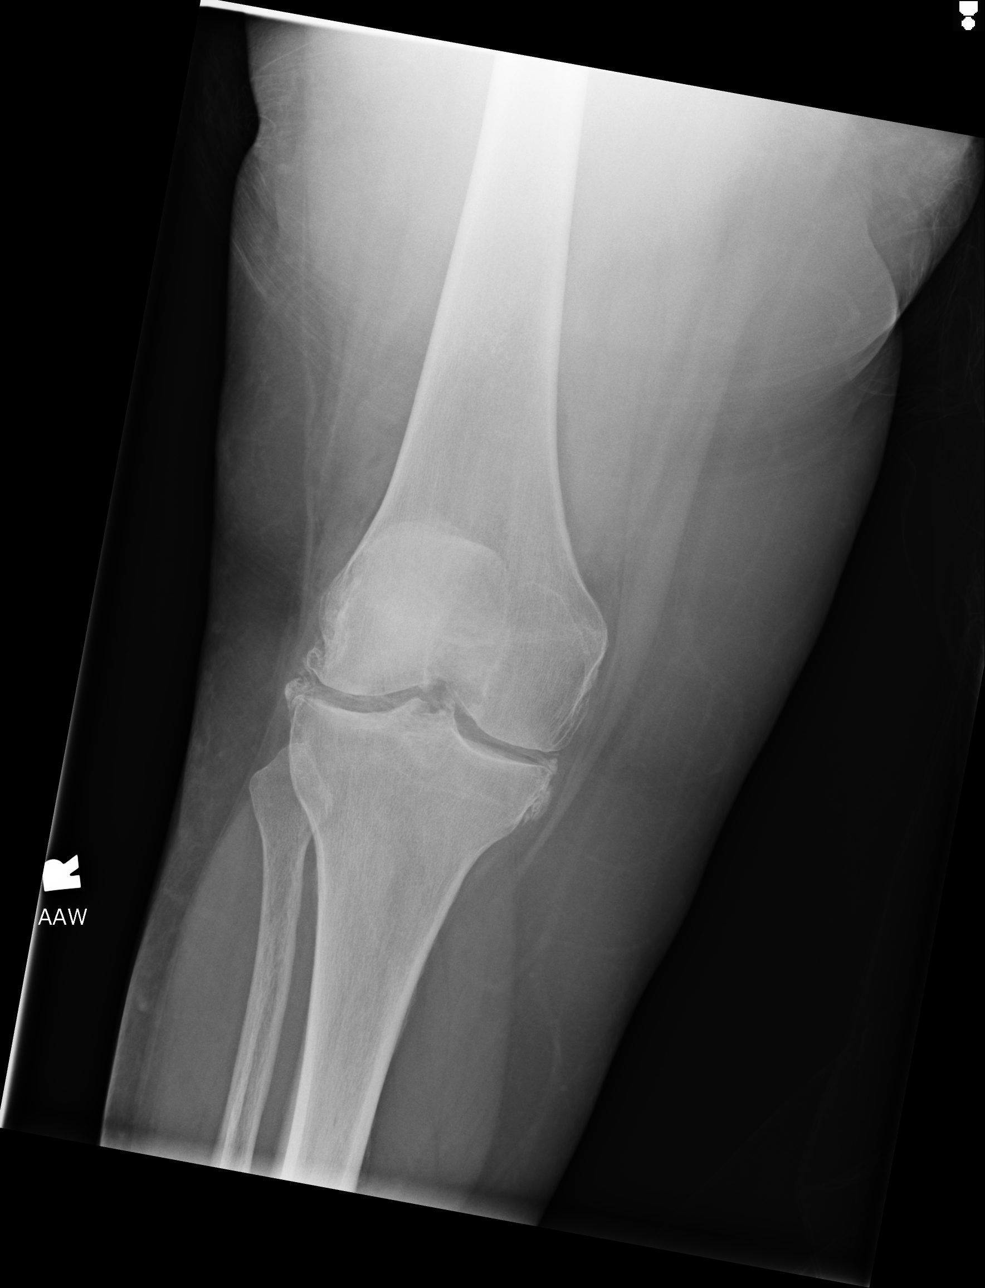

[4 of 4 positions shown; findings below may reference images not displayed]

FINDINGS: There are findings of chondrocalcinosis in the medial and lateral
compartments. Mild degenerative change in the lateral and patellofemoral
compartments. A small knee effusion is present. Ossification adjacent to the
medial tibia plateau is likely sequela of old prior trauma.
IMPRESSION: Degenerative change in the lateral and patellofemoral compartments, with
findings most commonly seen with CPPD arthropathy.

[REDACTED]

## 2012-05-02 IMAGING — US US EXTREM LOW VENOUS*R*
1 series · 14 of 24 positions shown · non-contrast
Comparison: none

REASON FOR EXAM: right leg pain
COMMENTS:

[Series 1: us extrem low venous*right* · 0.12mm/px · 14 of 26 slices shown]
[im 1/26]
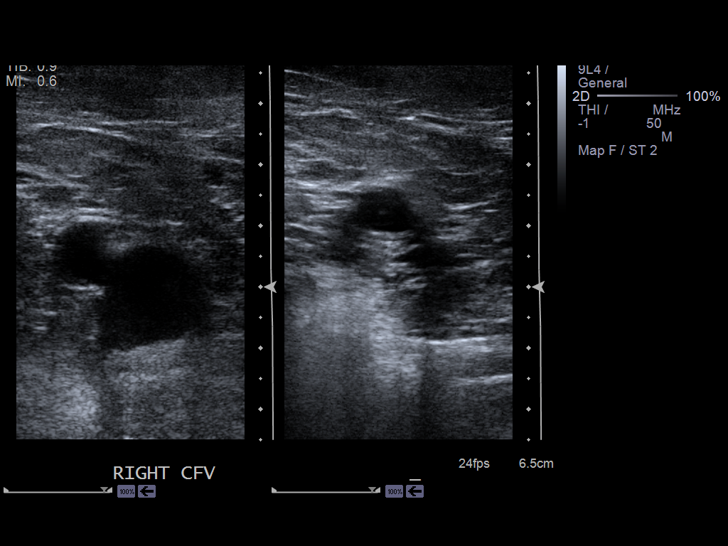
[im 3/26]
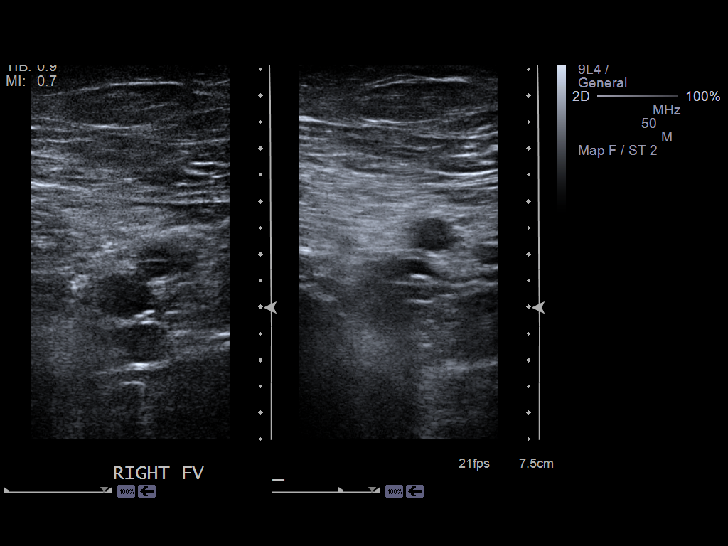
[im 5/26]
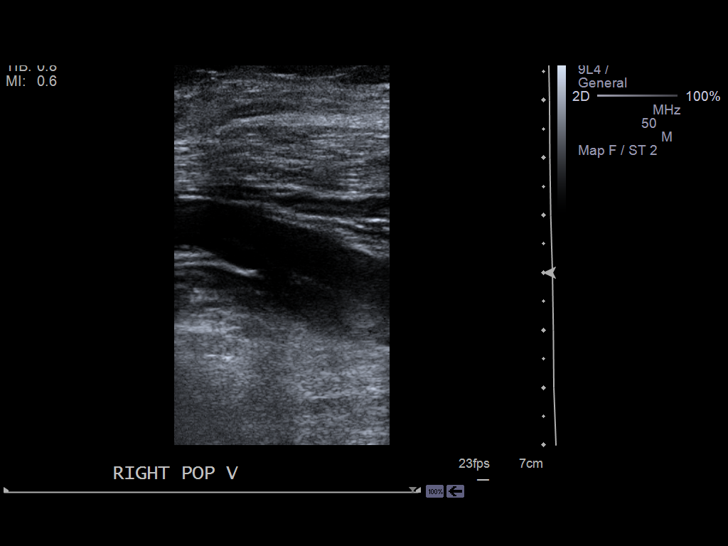
[im 7/26]
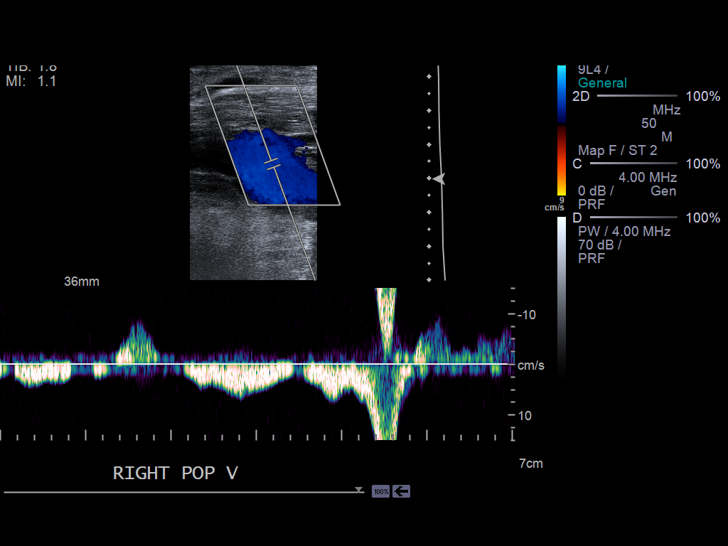
[im 8/26]
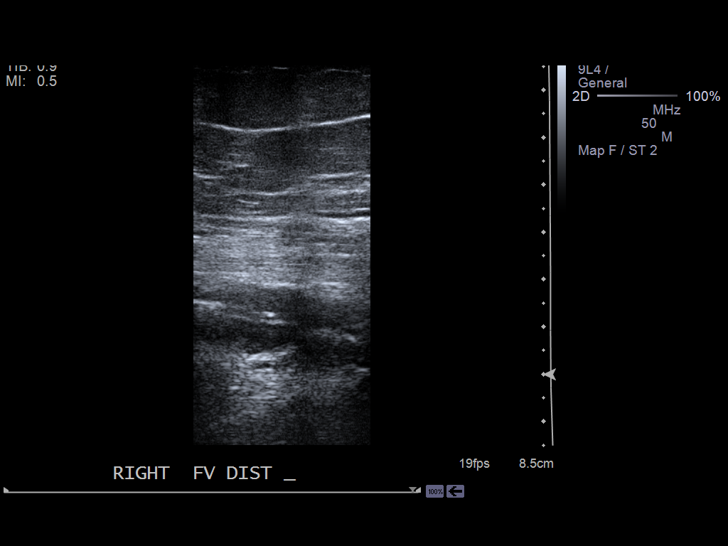
[im 10/26]
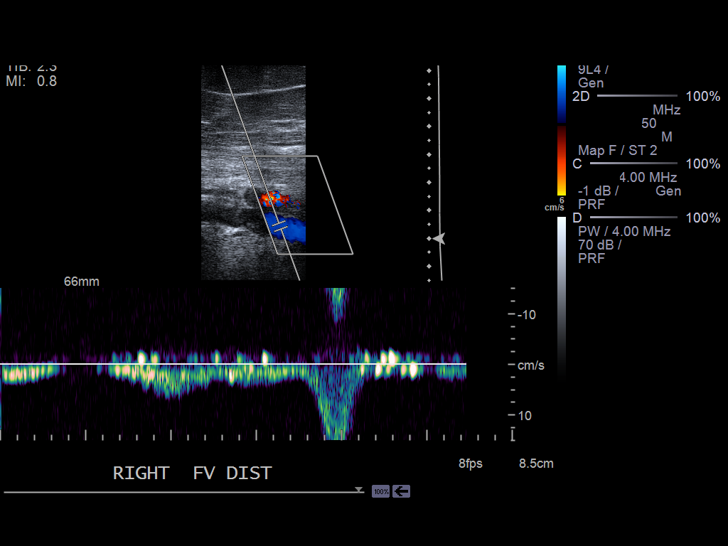
[im 12/26]
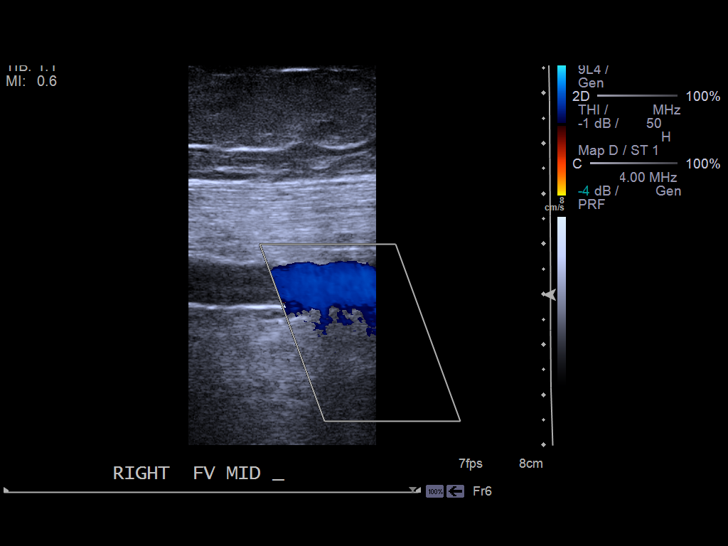
[im 14/26]
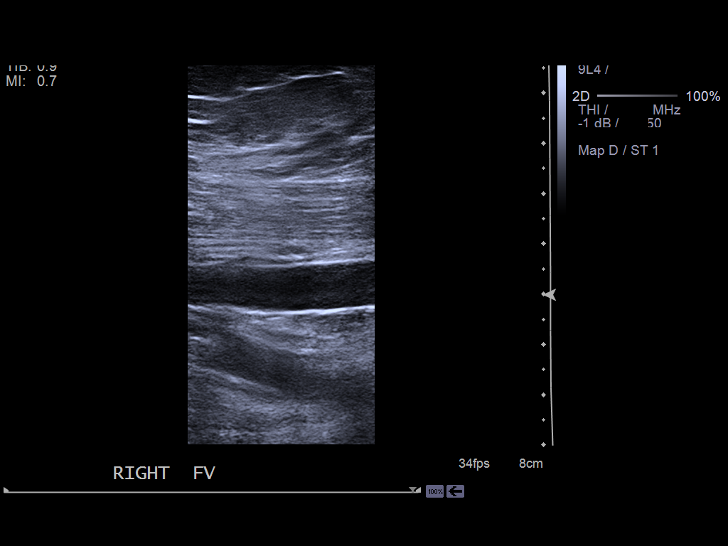
[im 16/26]
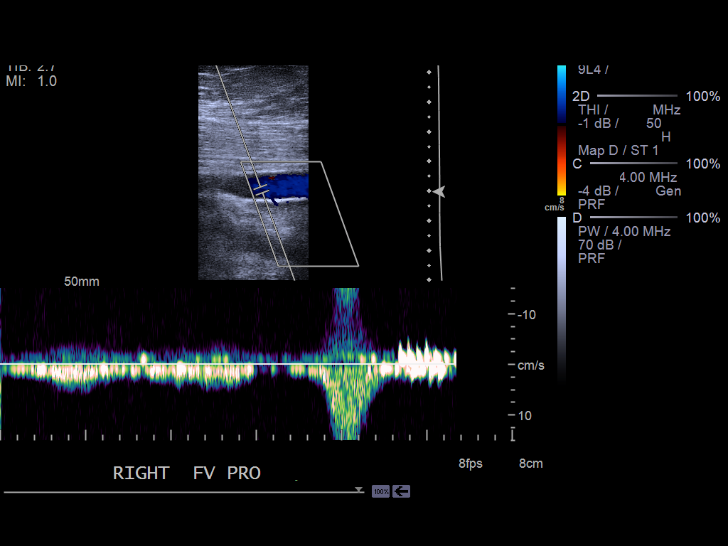
[im 18/26]
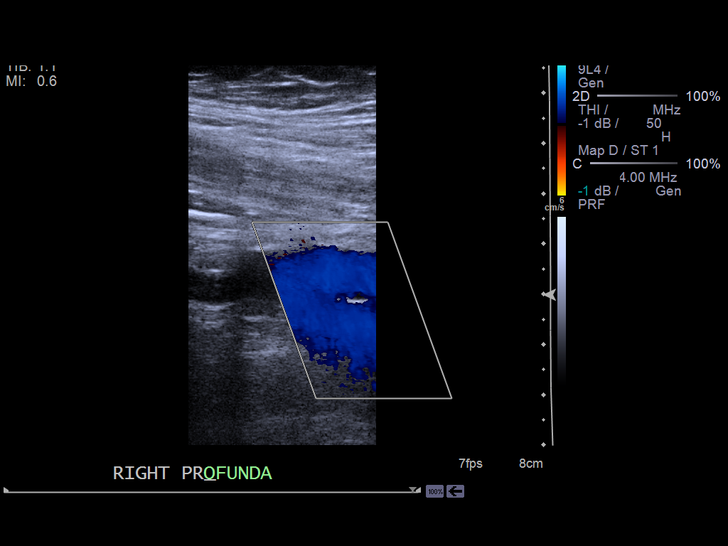
[im 20/26]
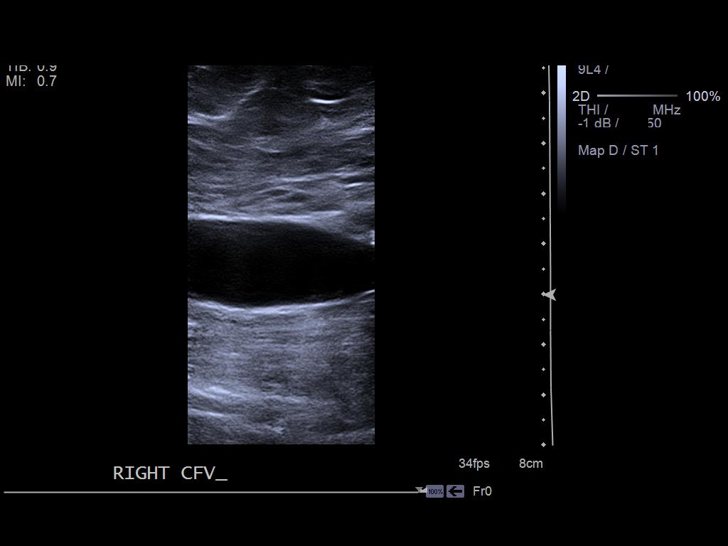
[im 21/26]
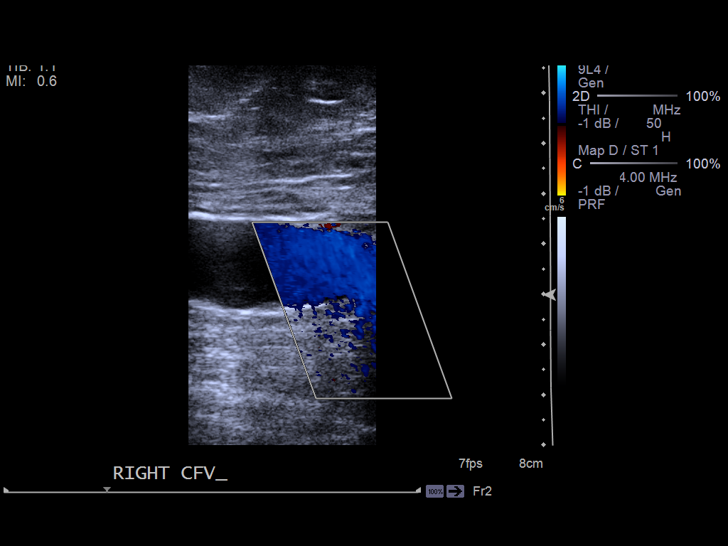
[im 23/26]
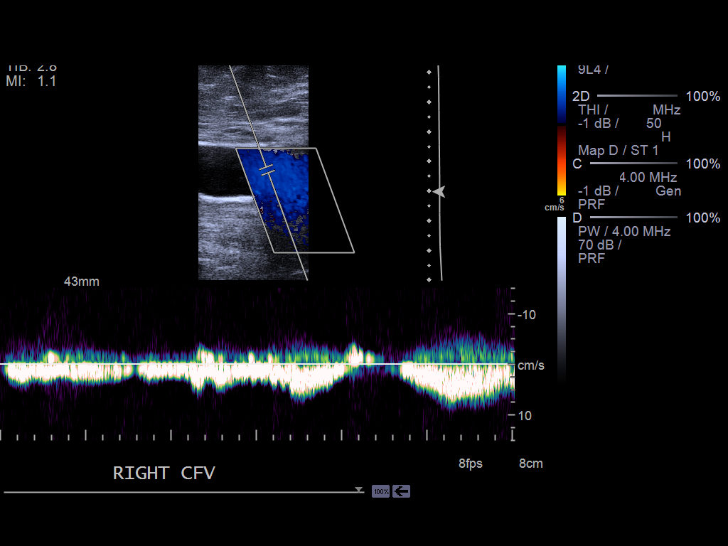
[im 26/26]
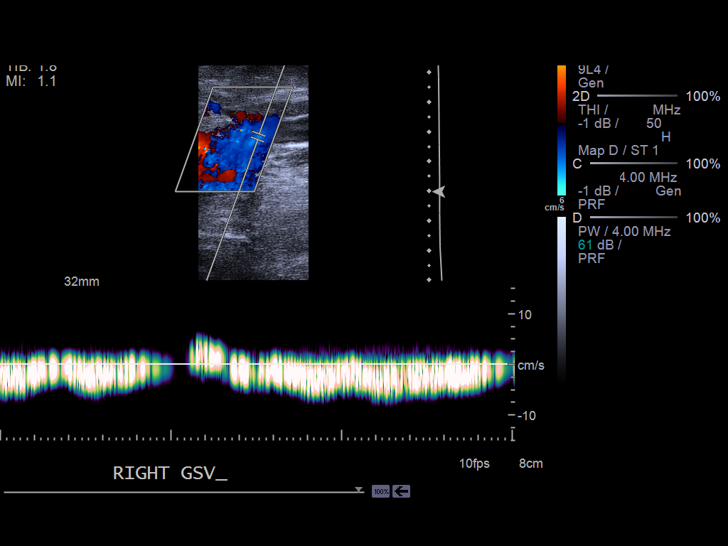

[14 of 24 positions shown; findings below may reference images not displayed]

PROCEDURE:     US  - US DOPPLER LOW EXTR RIGHT  - [DATE]  [DATE]

RESULT:     Comparison: None

Technique and findings: Multiple longitudinal and transverse grayscale as
well as color and spectral Doppler images of the right lower extremity veins
were obtained from the common femoral veins through the popliteal veins.

The right common femoral, femoral, and popliteal veins are patent,
demonstrating normal color-flow and compressibility. No intraluminal
thrombus is identified.  There is normal respiratory variation and
augmentation demonstrated at all vein levels.
IMPRESSION: No evidence of DVT in the right lower extremity.

[REDACTED]

## 2012-05-03 LAB — BASIC METABOLIC PANEL
BUN: 14 mg/dL (ref 7–18)
Calcium, Total: 9 mg/dL (ref 8.5–10.1)
Chloride: 103 mmol/L (ref 98–107)
EGFR (Non-African Amer.): >60
Glucose: 158 mg/dL — ABNORMAL HIGH (ref 65–99)
Osmolality: 279 (ref 275–301)
Potassium: 3.7 mmol/L (ref 3.5–5.1)

## 2012-05-03 LAB — CBC WITH DIFFERENTIAL/PLATELET
Basophil %: 0.1 %
Eosinophil #: 0 10*3/uL (ref 0.0–0.7)
Eosinophil %: 0 %
HCT: 42.3 % (ref 35.0–47.0)
HGB: 14.3 g/dL (ref 12.0–16.0)
Lymphocyte %: 4.7 %
MCH: 32.1 pg (ref 26.0–34.0)
MCHC: 33.8 g/dL (ref 32.0–36.0)
Monocyte #: 0.4 x10 3/mm (ref 0.2–0.9)
Monocyte %: 3 %
Platelet: 191 10*3/uL (ref 150–440)

## 2012-05-04 LAB — URINE CULTURE

## 2012-07-28 ENCOUNTER — Ambulatory Visit: Payer: Self-pay | Admitting: Family Medicine

## 2012-07-28 IMAGING — MG MM CAD SCREENING MAMMO
1 series · 4 of 4 positions shown · non-contrast
Comparison: [DATE], [DATE], [DATE].

REASON FOR EXAM: SCR MAMMO NO ORDER
COMMENTS:

PROCEDURE:     MAM - MAM DGTL SCRN MAM NO ORDER W/CAD  - [DATE]  [DATE]
RESULT:

[R CC · right · 4 of 4 slices shown]
[im 1/4]
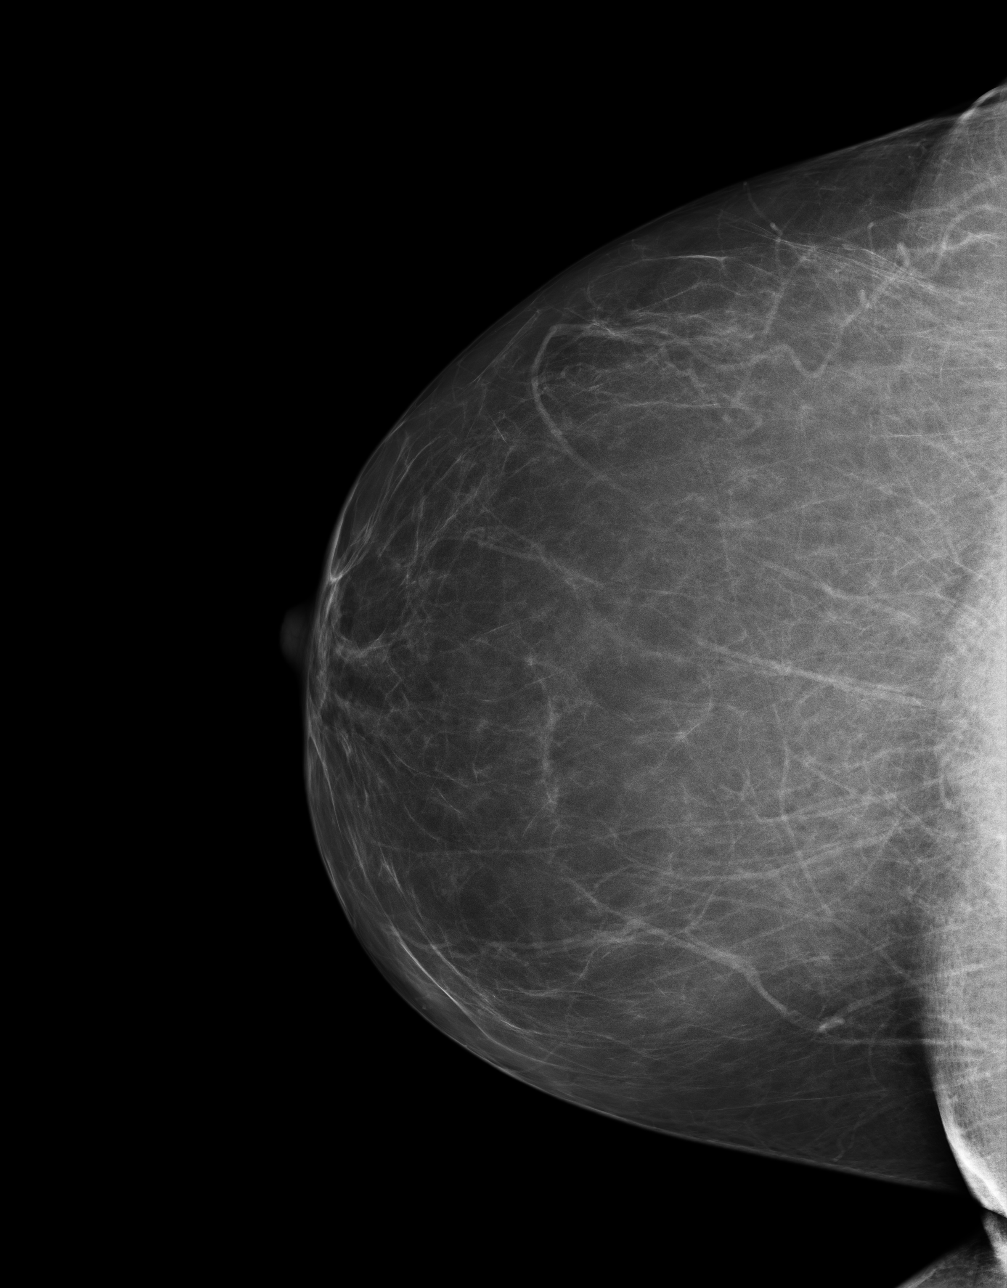
[im 2/4]
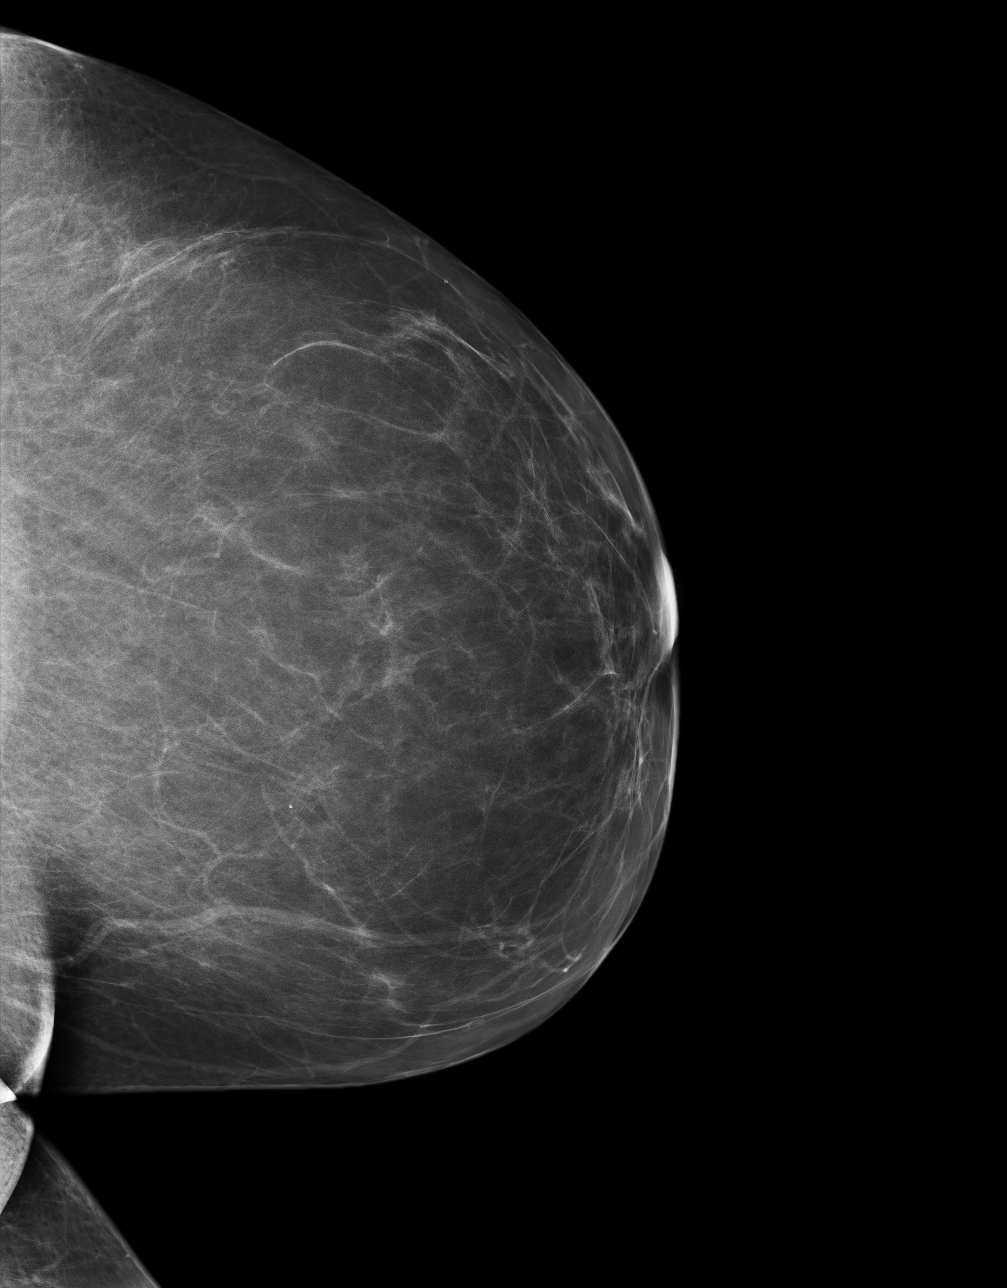
[im 3/4]
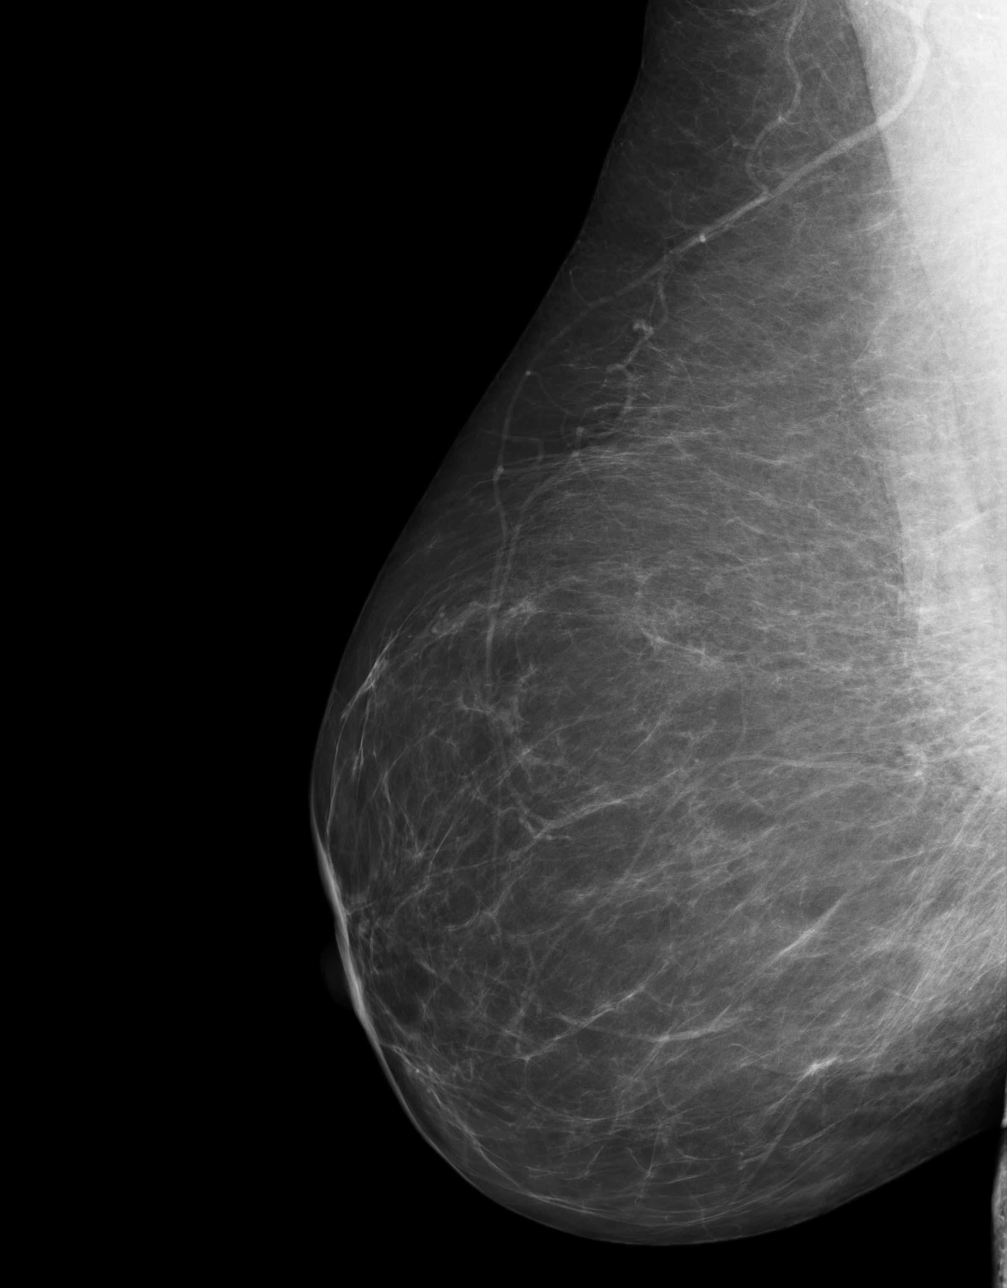
[im 4/4]
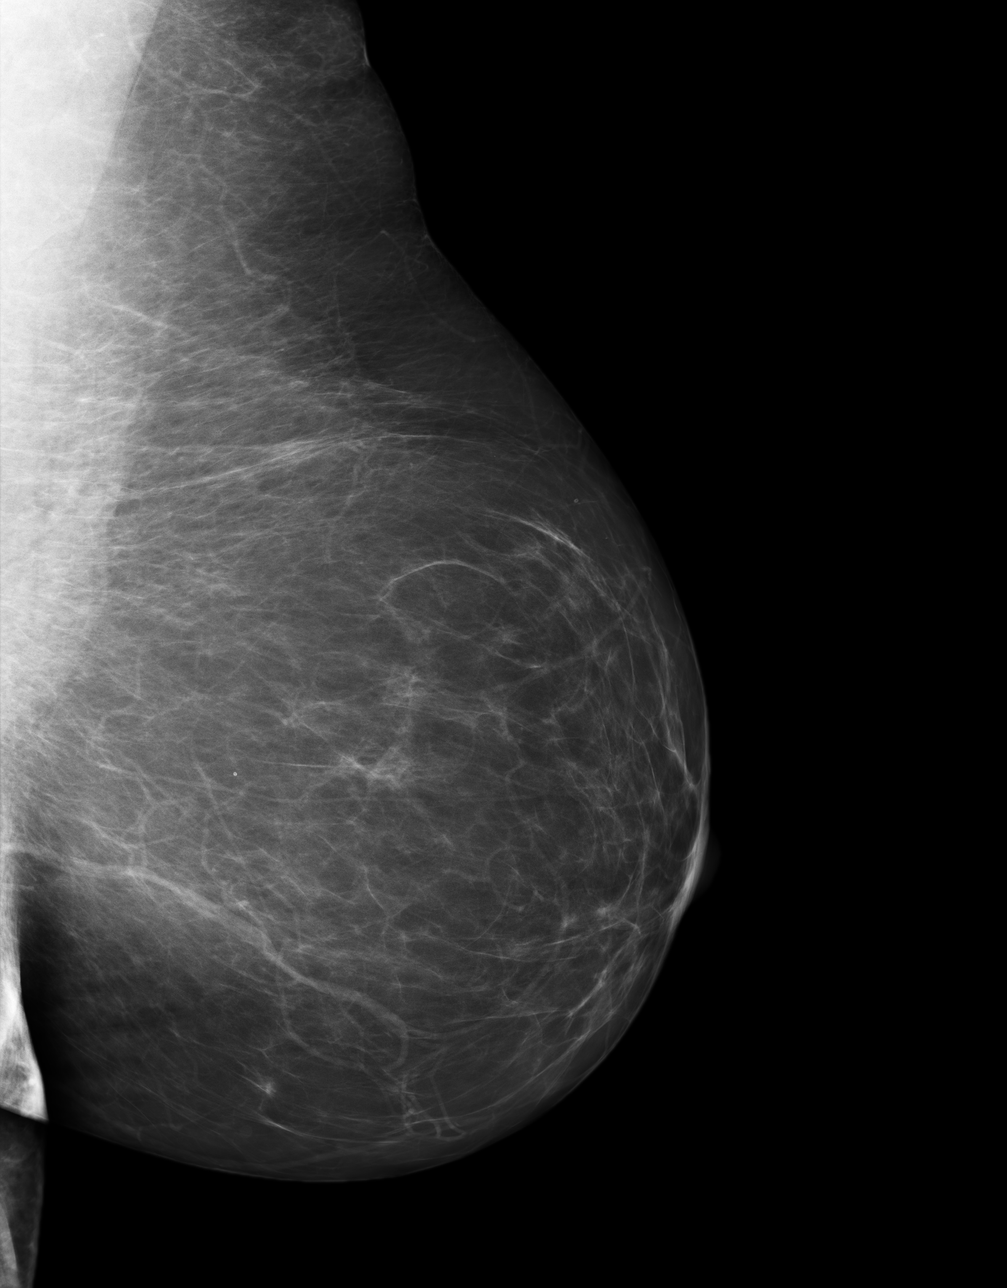

[4 of 4 positions shown; findings below may reference images not displayed]

FINDINGS: The breast tissue is almost entirely fatty. No suspicious masses or
calcifications are identified. No areas of architectural distortion.
IMPRESSION: 1.     BI-RADS: Category 1 Negative.
2.     Recommend continued annual screening mammography.

BREAST COMPOSITION:  The breast composition is ALMOST ENTIRELY FATTY
(glandular tissue is less than 25%).

Thank you for this opportunity to contribute to the care of your patient.

A NEGATIVE MAMMOGRAM REPORT DOES NOT PRECLUDE BIOPSY OR OTHER EVALUATION OF
A CLINICALLY PALPABLE OR OTHERWISE SUSPICIOUS MASS OR LESION. BREAST CANCER
MAY NOT BE DETECTED BY MAMMOGRAPHY IN UP TO 10% OF CASES.

## 2012-08-01 ENCOUNTER — Ambulatory Visit: Payer: Self-pay | Admitting: Family Medicine

## 2013-03-20 ENCOUNTER — Ambulatory Visit: Payer: Self-pay | Admitting: Unknown Physician Specialty

## 2013-03-20 LAB — HM COLONOSCOPY

## 2013-03-21 LAB — PATHOLOGY REPORT

## 2013-07-27 LAB — CBC AND DIFFERENTIAL
HCT: 42 % (ref 36–46)
HEMOGLOBIN: 13.7 g/dL (ref 12.0–16.0)
Platelets: 201 10*3/uL (ref 150–399)
WBC: 6.4 10^3/mL

## 2013-07-27 LAB — BASIC METABOLIC PANEL
BUN: 16 mg/dL (ref 4–21)
Creatinine: 0.8 mg/dL (ref 0.5–1.1)
GLUCOSE: 94 mg/dL
POTASSIUM: 4.6 mmol/L (ref 3.4–5.3)
SODIUM: 140 mmol/L (ref 137–147)

## 2013-07-27 LAB — LIPID PANEL
Cholesterol: 190 mg/dL (ref 0–200)
HDL: 41 mg/dL (ref 35–70)
LDL Cholesterol: 95 mg/dL
Triglycerides: 270 mg/dL — AB (ref 40–160)

## 2013-07-27 LAB — HEPATIC FUNCTION PANEL
ALT: 19 U/L (ref 7–35)
AST: 21 U/L (ref 13–35)

## 2014-01-18 LAB — TSH: TSH: 3.16 u[IU]/mL (ref <=5.90)

## 2014-04-04 LAB — HEMOGLOBIN A1C: HEMOGLOBIN A1C: 5.6 % (ref 4.0–6.0)

## 2014-07-13 NOTE — H&P (Signed)
PATIENT NAME:  Robin Espinoza, Robin Espinoza MR#:  [id number] DATE OF BIRTH:  1945/03/25  DATE OF ADMISSION:  05/02/2012  PRIMARY CARE PHYSICIAN: Dr. Venia Minks.   CHIEF COMPLAINT: Not feeling well.   HISTORY OF PRESENT ILLNESS: This is a 69 year old female, who states that she had the flu last week for 8 days, not feeling well with fever, cough and wheeze. Se slept for a week. She did not get any better. Yesterday, she developed right knee pain and unable to walk. Normally she takes meloxicam for arthritis and she is unable to put any weight down or move her right knee since Sunday. 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 The ER physician tried to do a joint aspiration, but no fluid was removed. The patient was also a little confused asking the same questions over and over again. The patient is not the best historian.   PAST MEDICAL HISTORY: Obesity, hypertension, hyperlipidemia, hypothyroidism and sleep apnea.   PAST SURGICAL HISTORY: Cataracts, eyelid lift and tubal ligation.   ALLERGIES: No known drug allergies.   MEDICATIONS: As per prescription writer includes Xanax 0.5 mg 1 to 2 tablets twice a day as needed for anxiety, aspirin 81 mg daily, Centrum Silver 1 tablet daily, Lexapro 10 mg daily, levothyroxine 75 mcg daily, meloxicam 15 mg daily, metoprolol succinate extended release 100 mg daily. The patient also takes pravastatin, unknown dose.   SOCIAL HISTORY: No smoking. No drug use. Occasionally drinks a wine spritzer. 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   FAMILY HISTORY: Mother 4 and healthy. Father died at 70 of an MI and diabetes and was a smoker.   REVIEW OF SYSTEMS:   CONSTITUTIONAL: Positive for fever. No chills or sweats. Positive for fatigue. No weight gain. No weight loss.  EYES: No blurry vision.  EARS, NOSE, MOUTH, AND THROAT: No hearing loss.  Positive for postnasal drip and runny nose. No sore throat. No difficulty swallowing.  CARDIOVASCULAR: No chest pain. Positive for palpitations.  RESPIRATORY: Positive for coughing, nonproductive. Wheeze. No hemoptysis.  GASTROINTESTINAL: No nausea. No vomiting. No abdominal pain. Positive for diarrhea the other day, but resolved. No constipation. No bright red blood per rectum. No melena.  GENITOURINARY: No burning on urination or hematuria.  MUSCULOSKELETAL: Positive for right knee pain.  INTEGUMENTARY: Positive for rash and itching on neck and head.  NEUROLOGIC: No fainting or blackouts.  PSYCHIATRIC: On medication for anxiety and depression.  ENDOCRINE: Positive for hypothyroidism.  HEMATOLOGIC/LYMPHATIC: No anemia.   PHYSICAL EXAMINATION:  VITAL SIGNS: Temperature 100.1, pulse 72, respirations 20, blood pressure 131/68, pulse oximetry 94% on room air.  GENERAL: No respiratory distress.  EYES: Conjunctivae and lids normal. Pupils equal, round, and reactive to light. Extraocular muscles intact. No nystagmus.  EARS, NOSE, MOUTH, AND THROAT: Tympanic membrane on the right no erythema. Tympanic membrane the left blocked by wax. Nasal mucosa: No erythema. Throat: No erythema. No exudate seen. Lips and gums no lesions.  NECK: No JVD. No bruits. No lymphadenopathy. No thyromegaly. No thyroid nodules palpated.  RESPIRATORY: Decreased breath sounds bilaterally. Positive wheeze bilateral bases.  CARDIOVASCULAR: S1, S2 normal. No gallops, rubs, or murmurs heard. Carotid upstroke 2+ bilaterally. No bruits assessed. Pulses 1+ bilaterally. Trace edema of the lower extremity.  ABDOMEN: Soft, nontender. No organomegaly/splenomegaly. Normoactive bowel sounds. No masses felt.  LYMPHATIC: No lymph nodes in the neck.  MUSCULOSKELETAL: Trace edema.  No clubbing. No cyanosis. Tried to move right knee and the patient screams in pain and resists. The patient is unable to straight leg raise with the right leg. The  patient states that the pain is more posterior leg. When I palpate, I am feeling popliteal pulses.  SKIN: Positive erythematous rash around the neck.  NEUROLOGIC: Cranial nerves II through XII grossly intact. Unable to straight leg raise. Deep tendon reflexes 1+. Sensation intact to light touch lower extremities.  PSYCHIATRIC: The patient is oriented to person and place, but does ask the same questions over and over again.   LABORATORY, DIAGNOSTIC, AND RADIOLOGICAL DATA: Urinalysis: 3+ leukocyte esterase, 3+ blood. Ultrasound of the right lower extremity, negative for DVT. White blood cell count 12.0, hemoglobin and hematocrit 13.8 and 40.3, platelet count 183. Glucose 94, BUN 16, creatinine 0.69, sodium 137, potassium 4.0, chloride 102, CO2 27, calcium 8.7. Liver function tests normal range. Sedimentation rate 38. Chest x-ray no cardiopulmonary disease. Right knee degenerative changes. 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   ASSESSMENT AND PLAN:  1.  Encephalopathy, possibly secondary to infection. Will continue to monitor. Check neuro checks q. 4 hours.  2.  Systemic inflammatory response syndrome with a fever and leukocytosis. Found to have a urinary tract infection and asthmatic bronchitis. Will give Solu-Medrol 125 mg intravenous x 1, 40 mg intravenous q. 8 hours, Rocephin and Zithromax and nebulizer treatments. Will send off a urine culture.  3.  Right knee pain, unable to ambulate. Case discussed with Dr. Joie Bimler of orthopedics, who will evaluate the patient. If this is pseudogout, steroids should help. Less likely infection of the joint with unable to aspirate by ER physician. No joint effusion. Will get physical therapy evaluation.  4.  Anxiety and depression. Continue Lexapro and Xanax.  5.  Hypothyroidism. Continue levothyroxine.  6.  Rash on the neck. Solu-Medrol should help, but can take Benadryl p.r.n.  7.  Hypertension. Continue metoprolol.  8.  Hyperlipidemia. Continue  pravastatin.  9.  Sleep apnea, on CPAP at night.   TIME SPENT ON ADMISSION: 55 minutes.   CODE STATUS: The patient is a DNR.  I will also send off for a flu swab. At this point, we will hold off on Tamiflu since the patient had symptoms for over 8 days now.   ____________________________ Tana Conch. Leslye Peer, MD rjw:aw D: 05/02/2012 12:49:38 ET T: 05/02/2012 13:23:22 ET JOB#: [id number]  cc: Tana Conch. Leslye Peer, MD, <Dictator> Jerrell Belfast, MD Marisue Brooklyn MD ELECTRONICALLY SIGNED 05/11/2012 14:41

## 2014-07-13 NOTE — Consult Note (Signed)
Brief Consult Note: Diagnosis: Right knee pain.   Comments: Patient states that right knee pain began on Saturday.  She notes that she had similar pain 8 years ago and had a UTI.  She explains that treatment for UTI resolved her knee pain.  She advised me that she currently has a UTI and feels that this may be responsible for flare.  No injury.  No change in activity.  Right Knee: No obvious effusion of knee.  No erythema.  No warmth.  No lesions other than needle stick from ED attempt at aspiration.  Patella not ballotable.  ROM 0-40 degrees with severe pain.  + medial and lateral joint line TTP.  Xrays:  DJD of right knee with evidence of chondrocalcinosis and soft tissue mineralization.  Radiograph looks like CPPD knee.  May have an acute flare of inflammation although not a large effusion.  Would recommend WBAT with PT, pain control to improve motion.  Discussed with Dr. Earleen Newport - may benefit from steroids.  Possible rheum consult as outpatient.  Electronic Signatures: Dawayne Patricia (MD)  (Signed 10-Feb-14 18:16)  Authored: Brief Consult Note   Last Updated: 10-Feb-14 18:16 by Dawayne Patricia (MD)

## 2014-07-13 NOTE — Discharge Summary (Signed)
PATIENT NAME:  Robin Espinoza, Robin Espinoza MR#:  [id number] DATE OF BIRTH:  06-25-1945  DATE OF ADMISSION:  05/02/2012 DATE OF DISCHARGE:  05/04/2012  FINAL DIAGNOSES: 1.  Encephalopathy.  2.  Systemic inflammatory response syndrome with bronchitis with asthmatic component and also urinary tract infection.  3.  Right knee pain, likely pseudogout.  4.  Anxiety and depression.  5.  Hypothyroidism.  6.  Rash.  7.  Hypertension.  8.  Hyperlipidemia.  9.  Sleep apnea.  MEDICATIONS ON DISCHARGE: Include: Levothyroxine 75 mcg daily, Xanax 0.5 mg 1 to 2 tablets twice a day, meloxicam 15 mg daily, metoprolol succinate 100 mg extended release daily, Lexapro 10 mg daily, aspirin 81 mg daily, Centrum Silver with minerals 1 tablet daily, Lovastatin 40 mg daily, prednisone 5 mg 4 tabs day one, 3 tabs day two, 2 tabs day three, 1 tab day four and five, cefuroxime 500 mg every 12 hours for 7 days, azithromycin 250 mg daily for 2 more days.   DIET: Regular consistency. Low sodium diet.   ACTIVITY: Activity as tolerated.   REFERRAL: Home health.  FOLLOW-UP:  Dr. Venia Minks in 1 to 2 weeks.   REASON FOR ADMISSION: The patient was admitted 05/02/2012 not feeling well. She had been sleeping for 8 days prior to coming in fever, cough and wheeze, right knee pain and unable to walk. She was admitted to the hospital for encephalopathy, systemic inflammatory response syndrome, fever and leukocytosis, right knee pain, anxiety and depression. She was seen in consultation by Dr. Joie Bimler of Orthopedics.  LABORATORY AND RADIOLOGICAL DATA: Included blood cultures that were negative.   Right knee x-ray:  Degenerative changes, most common CPP arthropathy.   Chest x-ray: No acute cardiopulmonary disease.   Uric acid 3.7. Sedimentation rate 38, glucose 94, BUN 16, creatinine 0.69, sodium 137, potassium 4.0, chloride 102, CO2 of 27, calcium 8.7. Liver function tests normal range. White blood cell count 12.0, hemoglobin and hematocrit  13.8 and 40.3, platelet count of 183.   Ultrasound of the right lower extremity showed no deep vein thrombosis.   Urine culture grew out E. coli, pan-sensitive. Urinalysis 3+ leukocyte esterase. Influenza A and B were negative.   HOSPITAL COURSE PER PROBLEM LIST:  1.  For the patient's encephalopathy, I believe this is secondary to infection. This has improved.  2.  Systemic inflammatory response syndrome, bronchitis with an asthmatic component and urinary tract infection. The patient was started on Rocephin and Zithromax and switched over to Ceftin and Zithromax upon discharge. The patient will complete the course. The patient's lungs were clear upon discharge. Urinary tract infection will continue to be treated as outpatient also.  3.  Right knee pain, likely this is pseudogout. An attempt was tried to aspirate in the ER by ER physician. The patient was seen in consultation by Dr. Joie Bimler. The patient did walk around with physical therapy and range of motion improved during the hospitalization. I did give the patient a prescription for a walker and we set up home health physical therapy. 4.  For the patient's anxiety and depression, she is on Xanax and Lexapro. 5.  For the patient's hypothyroidism, she is on levothyroxine. 6.  For the rash on the neck, that had improved with IV Solu-Medrol.  7.  For the patient's hypertension, blood pressure was elevated during the hospitalization likely secondary to the prednisone. She will continue her metoprolol at home. Blood pressure was variable, also based on mood, as low as 117/68 and as high as  179/92.  8.  For her hyperlipidemia, she is on lovastatin.  9.  For her sleep apnea, she is on CPAP.   TIME SPENT ON DISCHARGE: 35 minutes    ____________________________ Tana Conch. Leslye Peer, MD rjw:cc D: 05/04/2012 14:33:34 ET T: 05/04/2012 21:29:06 ET JOB#: [id number]  cc: Tana Conch. Leslye Peer, MD, <Dictator> Marisue Brooklyn MD ELECTRONICALLY SIGNED  05/11/2012 14:41

## 2014-09-03 ENCOUNTER — Other Ambulatory Visit: Payer: Self-pay | Admitting: Family Medicine

## 2014-09-03 DIAGNOSIS — E039 Hypothyroidism, unspecified: Secondary | ICD-10-CM

## 2014-09-03 DIAGNOSIS — G4700 Insomnia, unspecified: Secondary | ICD-10-CM

## 2014-09-03 NOTE — Telephone Encounter (Signed)
Last refill Synthroid 07/12/2014. Last TSH 01/18/2014- 3.160. Last refill Trazodone 06/16/2014. LOV 04/04/2014. Renaldo Fiddler, CMA

## 2014-09-21 ENCOUNTER — Other Ambulatory Visit: Payer: Self-pay | Admitting: Family Medicine

## 2014-09-21 DIAGNOSIS — G4700 Insomnia, unspecified: Secondary | ICD-10-CM | POA: Insufficient documentation

## 2014-10-14 ENCOUNTER — Other Ambulatory Visit: Payer: Self-pay | Admitting: Family Medicine

## 2014-10-14 DIAGNOSIS — F419 Anxiety disorder, unspecified: Secondary | ICD-10-CM

## 2014-10-15 DIAGNOSIS — F419 Anxiety disorder, unspecified: Secondary | ICD-10-CM | POA: Insufficient documentation

## 2014-10-15 NOTE — Telephone Encounter (Signed)
Last OV 03/2014  Thanks,   -Laura  

## 2014-11-13 ENCOUNTER — Other Ambulatory Visit: Payer: Self-pay | Admitting: Family Medicine

## 2014-11-13 DIAGNOSIS — E039 Hypothyroidism, unspecified: Secondary | ICD-10-CM | POA: Insufficient documentation

## 2014-11-13 NOTE — Telephone Encounter (Signed)
Last OV 03/2014  Thanks,   -Laura  

## 2014-12-01 ENCOUNTER — Ambulatory Visit (INDEPENDENT_AMBULATORY_CARE_PROVIDER_SITE_OTHER): Payer: PPO | Admitting: Family Medicine

## 2014-12-01 VITALS — BP 118/78 | HR 68 | Temp 98.1°F | Resp 16

## 2014-12-01 DIAGNOSIS — H6123 Impacted cerumen, bilateral: Secondary | ICD-10-CM | POA: Diagnosis not present

## 2014-12-01 DIAGNOSIS — Z23 Encounter for immunization: Secondary | ICD-10-CM

## 2014-12-01 NOTE — Progress Notes (Signed)
Patient ID: Robin Espinoza, female   DOB: 1946-03-11, 69 y.o.   MRN: [id number]        Patient: Robin Espinoza Female    DOB: 14-Aug-1945   69 y.o.   MRN: [id number] Visit Date: 12/01/2014  Today's Provider: Wilhemena Durie, MD   Chief Complaint  Patient presents with  . Cerumen Impaction   Subjective:    HPI   Cerumen Impaction: Patient presents with diminished hearing for the past 1 day.  There is a prior history of cerumen impaction.  The patient is using ear drops to loosen wax immediately prior to this visit.      Allergies not on file Previous Medications   LEVOTHYROXINE (SYNTHROID, LEVOTHROID) 100 MCG TABLET    TAKE 1 TABLET BY MOUTH EVERY DAY   TRAZODONE (DESYREL) 50 MG TABLET    TAKE 1 TABLET BY MOUTH EVERY DAY   VENLAFAXINE XR (EFFEXOR-XR) 75 MG 24 HR CAPSULE    TAKE ONE CAPSULE BY MOUTH ONCE A DAY    Review of Systems  HENT: Positive for hearing loss. Negative for congestion, dental problem, drooling, ear discharge, ear pain, facial swelling, mouth sores, nosebleeds, postnasal drip, rhinorrhea, sinus pressure, sneezing, sore throat, tinnitus, trouble swallowing and voice change.   Eyes: Negative.   Respiratory: Negative.   Cardiovascular: Negative.   Neurological: Negative.   Hematological: Negative.   Psychiatric/Behavioral: Negative.     Social History  Substance Use Topics  . Smoking status: Not on file  . Smokeless tobacco: Not on file  . Alcohol Use: Not on file   Objective:   There were no vitals taken for this visit.  Physical Exam  Constitutional: She is oriented to person, place, and time. She appears well-developed and well-nourished.  HENT:  Head: Normocephalic and atraumatic.  Right Ear: External ear normal.  Left Ear: External ear normal.  Nose: Nose normal.  Eyes: Conjunctivae are normal.  Neck: Neck supple.  Cardiovascular: Normal rate, regular rhythm and normal heart sounds.   Pulmonary/Chest: Effort normal and breath sounds  normal.  Abdominal: Soft.  Neurological: She is alert and oriented to person, place, and time.  Skin: Skin is warm and dry.  Psychiatric: She has a normal mood and affect. Her behavior is normal. Judgment and thought content normal.        Assessment & Plan:     1. Cerumen impaction, bilateral Cleared--hearing back to normal. 2.Obesity      Wilhemena Durie, MD  [facility]

## 2014-12-03 DIAGNOSIS — Z23 Encounter for immunization: Secondary | ICD-10-CM | POA: Diagnosis not present

## 2014-12-03 NOTE — Addendum Note (Signed)
Addended by: Ashley Royalty E on: 12/03/2014 09:59 AM   Modules accepted: Orders

## 2014-12-07 ENCOUNTER — Encounter: Payer: PPO | Admitting: Physician Assistant

## 2015-01-09 ENCOUNTER — Other Ambulatory Visit: Payer: Self-pay | Admitting: Family Medicine

## 2015-01-09 DIAGNOSIS — K219 Gastro-esophageal reflux disease without esophagitis: Secondary | ICD-10-CM

## 2015-02-28 ENCOUNTER — Encounter: Payer: PPO | Admitting: Physician Assistant

## 2015-02-28 DIAGNOSIS — E559 Vitamin D deficiency, unspecified: Secondary | ICD-10-CM | POA: Insufficient documentation

## 2015-02-28 DIAGNOSIS — R6889 Other general symptoms and signs: Secondary | ICD-10-CM | POA: Insufficient documentation

## 2015-02-28 DIAGNOSIS — E7800 Pure hypercholesterolemia, unspecified: Secondary | ICD-10-CM | POA: Insufficient documentation

## 2015-03-04 ENCOUNTER — Encounter: Payer: Self-pay | Admitting: Physician Assistant

## 2015-03-04 ENCOUNTER — Ambulatory Visit (INDEPENDENT_AMBULATORY_CARE_PROVIDER_SITE_OTHER): Payer: PPO | Admitting: Physician Assistant

## 2015-03-04 ENCOUNTER — Other Ambulatory Visit: Payer: Self-pay | Admitting: Family Medicine

## 2015-03-04 VITALS — BP 160/88 | HR 67 | Temp 98.9°F | Resp 16 | Ht 65.5 in | Wt 260.4 lb

## 2015-03-04 DIAGNOSIS — R7309 Other abnormal glucose: Secondary | ICD-10-CM | POA: Diagnosis not present

## 2015-03-04 DIAGNOSIS — E559 Vitamin D deficiency, unspecified: Secondary | ICD-10-CM | POA: Diagnosis not present

## 2015-03-04 DIAGNOSIS — E7800 Pure hypercholesterolemia, unspecified: Secondary | ICD-10-CM | POA: Diagnosis not present

## 2015-03-04 DIAGNOSIS — E039 Hypothyroidism, unspecified: Secondary | ICD-10-CM | POA: Diagnosis not present

## 2015-03-04 DIAGNOSIS — G4700 Insomnia, unspecified: Secondary | ICD-10-CM | POA: Diagnosis not present

## 2015-03-04 DIAGNOSIS — Z0000 Encounter for general adult medical examination without abnormal findings: Secondary | ICD-10-CM

## 2015-03-04 DIAGNOSIS — I10 Essential (primary) hypertension: Secondary | ICD-10-CM | POA: Diagnosis not present

## 2015-03-04 DIAGNOSIS — Z1239 Encounter for other screening for malignant neoplasm of breast: Secondary | ICD-10-CM | POA: Diagnosis not present

## 2015-03-04 MED ORDER — ZOLPIDEM TARTRATE ER 12.5 MG PO TBCR: 12.5000 mg | EXTENDED_RELEASE_TABLET | Freq: Every evening | ORAL | Status: DC | PRN

## 2015-03-04 MED ORDER — TRAZODONE HCL 50 MG PO TABS: 50.0000 mg | ORAL_TABLET | Freq: Every day | ORAL | Status: DC

## 2015-03-04 NOTE — Progress Notes (Addendum)
Patient: Robin Espinoza, Female    DOB: [birth year], 69 y.o.   MRN: [id number] Visit Date: 03/04/2015  Today's Provider: Mar Daring, PA-C   Chief Complaint  Patient presents with  . Medicare Wellness   Subjective:    Annual wellness visit Robin Espinoza is a 69 y.o. female. She feels fairly well, per patient feeling more tired than usual. She reports not exercising. She reports she is sleeping fairly well. Takes medication to help with the sleep. Per patient is awaking up twice during the night. She is curious if she has become used to the trazodone since she has been taking it for so long. Her last Pap smear was in March 2014 and was normal. She is not due for Pap smear until next year. She has not had any abnormal Pap smears and denies any abnormal bleeding. There is no personal or family history of cervical, ovarian or uterine cancer. Last mammogram was done last year reported normal. Does perform monthly self breast exams. There is no family history of breast cancer. She has had a colonoscopy 03/20/2013. It was reported overall normal with few diverticula found and internal hemorrhoids. This was done by Dr. Vira Agar. She is to repeat colonoscopy in 5-10 years. There is no family history of colon cancer. She is also had her bone mineral scan done 08/01/2012. This was her second bone scan. She was found to have mild osteopenia. This had worsened since her previous bone scan which was normal. May consider repeating bone scan next year if patient desires.  Last PCP: 07/20/2013 Pap: 06/17/2012 Normal, HPV Negative Colonoscopy: 03/20/2013-diverticula and internal hemorrhoids. Dr. Casimer Leek in 5-10 years BMD: 08/01/2012- Osteopenia in femur bilaterally; all other areas WNL.  Repeat in 2-3 years per Medicare protocol -----------------------------------------------------------   Review of Systems  Constitutional: Positive for activity change (decreased activity level compared  to previous) and fatigue.  HENT: Negative.   Eyes: Negative.   Respiratory: Positive for apnea (Cpap).   Cardiovascular: Negative.   Gastrointestinal: Negative.   Endocrine: Negative.   Genitourinary: Negative.   Musculoskeletal: Negative.   Skin: Negative.   Allergic/Immunologic: Negative.   Neurological: Negative.   Hematological: Negative.   Psychiatric/Behavioral: Negative.     Social History   Social History  . Marital Status: Married    Spouse Name: N/A  . Number of Children: N/A  . Years of Education: N/A   Occupational History  . Not on file.   Social History Main Topics  . Smoking status: Former Research scientist (life sciences)  . Smokeless tobacco: Not on file     Comment: in college  . Alcohol Use: Yes     Comment: Drinks a glass of wine 1-3 glasses a month  . Drug Use: No  . Sexual Activity: Not on file   Other Topics Concern  . Not on file   Social History Narrative    Patient Active Problem List   Diagnosis Date Noted  . Other general symptoms and signs 02/28/2015  . Pure hypercholesterolemia 02/28/2015  . Avitaminosis D 02/28/2015  . Need for influenza vaccination 12/03/2014  . Hypothyroidism 11/13/2014  . Anxiety disorder 10/15/2014  . Insomnia 09/21/2014  . Acid reflux 11/30/2007  . Arthritis, degenerative 09/16/2007  . Fam hx-ischem heart disease 02/12/2007  . Adiposity 02/09/2007  . Obstructive apnea 05/06/2006  . 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 BP (high blood pressure) 03/23/1998    Past Surgical History  Procedure Laterality Date  .  Tubal ligation  1981    Her family history includes Diabetes in her father.    Previous Medications   ASPIRIN 81 MG TABLET    Take by mouth.   CLOTRIMAZOLE-BETAMETHASONE (LOTRISONE) CREAM    CLOTRIMAZOLE-BETAMETHASONE, 1-0.05% (External Cream)  Apply bid to affected area for 0 days  Quantity: 30.00;  Refills: 1   Ordered :08-Dec-2013  Margarita Rana MD;  Started 08-Dec-2013 Active  Comments: DX: 112.9   CVS VITAMIN D3 1000 UNITS CAPSULE    Take 1,000 Units by mouth daily.   LEVOTHYROXINE (SYNTHROID, LEVOTHROID) 100 MCG TABLET    TAKE 1 TABLET BY MOUTH EVERY DAY   LOVASTATIN (MEVACOR) 40 MG TABLET    Take by mouth.   MELOXICAM (MOBIC) 15 MG TABLET    Take 15 mg by mouth daily.   METOPROLOL SUCCINATE (TOPROL-XL) 100 MG 24 HR TABLET    Take 100 mg by mouth daily.   MULTIPLE VITAMIN TABLET       NON FORMULARY    CPAP   OMEPRAZOLE (PRILOSEC) 20 MG CAPSULE    1 CAPSULE DR, ORAL, DAILY   TRAZODONE (DESYREL) 50 MG TABLET    Take 1 tablet (50 mg total) by mouth daily.   VENLAFAXINE XR (EFFEXOR-XR) 75 MG 24 HR CAPSULE    TAKE ONE CAPSULE BY MOUTH ONCE A DAY   Audit-C Alcohol Use Screening  Question Answer Points  How often do you have alcoholic drink? monthly 1  On days you do drink alcohol, how many drinks do you typically consume? 1-2 0  How oftey will you drink 6 or more in a total? never 0  Total Score:  1   A score of 3 or more in women, and 4 or more in men indicates increased risk for alcohol abuse, EXCEPT if all of the points are from question 1.  Patient Care Team: Margarita Rana, MD as PCP - General (Family Medicine)     Objective:   Vitals: BP 160/88 mmHg  Pulse 67  Temp(Src) 98.9 F (37.2 C) (Oral)  Resp 16  Ht 5' 5.5" (1.664 m)  Wt 260 lb 6.4 oz (118.117 kg)  BMI 42.66 kg/m2  Physical Exam  Constitutional: She is oriented to person, place, and time. She appears well-developed and well-nourished. No distress.  HENT:  Head: Normocephalic and atraumatic.  Right Ear: External ear normal.  Left Ear: External ear normal.  Nose: Nose normal.  Mouth/Throat: Oropharynx is clear and moist. No oropharyngeal exudate.  Eyes: Conjunctivae and EOM are normal. Pupils are equal, round, and reactive to light. Right eye exhibits no discharge. Left eye exhibits no discharge. No scleral icterus.  Neck: Normal range of motion. Neck supple. No JVD present. No tracheal  deviation present. No thyromegaly present.  Cardiovascular: Normal rate, regular rhythm, normal heart sounds and intact distal pulses.  Exam reveals no gallop and no friction rub.   No murmur heard. Pulmonary/Chest: Effort normal and breath sounds normal. No respiratory distress. She has no wheezes. She has no rales. She exhibits no tenderness. Right breast exhibits no inverted nipple, no mass, no nipple discharge, no skin change and no tenderness. Left breast exhibits no inverted nipple, no mass, no nipple discharge, no skin change and no tenderness. Breasts are symmetrical.  Abdominal: Soft. Bowel sounds are normal. She exhibits no distension and no mass. There is no tenderness. There is no rebound and no guarding.  Genitourinary:  Deferred until 2017  Musculoskeletal: Normal range of motion. She exhibits no edema or  tenderness.  Lymphadenopathy:    She has no cervical adenopathy.  Neurological: She is alert and oriented to person, place, and time.  Skin: Skin is warm and dry. No rash noted. She is not diaphoretic.  Psychiatric: She has a normal mood and affect. Her behavior is normal. Judgment and thought content normal.  Vitals reviewed.   Activities of Daily Living In your present state of health, do you have any difficulty performing the following activities: 03/04/2015  Hearing? N  Vision? Y  Difficulty concentrating or making decisions? N  Walking or climbing stairs? Y  Dressing or bathing? N  Doing errands, shopping? N    Fall Risk Assessment Fall Risk  03/04/2015  Falls in the past year? No     Depression Screen PHQ 2/9 Scores 03/04/2015  PHQ - 2 Score 0    Cognitive Testing - 6-CIT  Correct? Score   What year is it? yes 0 0 or 4  What month is it? yes 0 0 or 3  Memorize:    Pia Mau,  [birth year],  [place],      What time is it? (within 1 hour) yes 0 0 or 3  Count backwards from 20 yes 0 0, 2, or 4  Name the months of the year yes 0 0, 2, or 4  Repeat  name & address above yes 0 0, 2, 4, 6, 8, or 10       TOTAL SCORE  0/28   Interpretation:  Normal  Normal (0-7) Abnormal (8-28)       Assessment & Plan:     Annual Wellness Visit  Reviewed patient's Family Medical History Reviewed and updated list of patient's medical providers Assessment of cognitive impairment was done Assessed patient's functional ability Established a written schedule for health screening [place] Completed and Reviewed  1. Medicare annual wellness visit, subsequent  2. Insomnia Has been worsening while using trazodone 50 mg. She states she has been on this for a while and feels that she may be becoming used to this medication. Discussed different types of sleep aids. She has tried melatonin as well as Zoloft. Neither worked well. She is interested in trying Ambien at this time. She will call the office if she has any adverse effects with this medication or if it makes her too drowsy. If it does make her too drowsy we may decrease the dose to the 6.25 mg versus possibly changing to another sleep aid. If she does well with the Ambien she may call and we will refill as necessary. - zolpidem (AMBIEN CR) 12.5 MG CR tablet; Take 1 tablet (12.5 mg total) by mouth at bedtime as needed for sleep.  Dispense: 30 tablet; Refill: 0  3. Breast cancer screening Breast exam today in the office was normal. She does perform self breast exams monthly. Screening mammogram was ordered as below. Information for [facility] breast clinic was given to patient so that she may call to schedule her mammogram at her convenience. We'll follow-up pending mammogram results or in one year if mammogram is normal. - MM DIGITAL SCREENING BILATERAL; Future  4. Essential hypertension Blood pressure was slightly elevated today in the office but patient states that she had rush to get here. Previous blood pressure readings were normal. She is to continue her current medication  regimen. I will check labs as below and follow-up pending lab results. If labs are stable and within normal limits I will follow-up with her  in one year for her repeat annual physical exam. - CBC with Differential/Platelet - Comprehensive metabolic panel  5. Hypothyroidism, unspecified hypothyroidism type Stable on current dose of 100 g of levothyroxine. We'll check TSH level as below. I will follow-up with her pending these lab results. If labs are stable I will follow-up with her in one year for her repeat annual physical exam and she will continue her current dose of levothyroxine. - TSH  *TSH was elevated at 6.370 so we will increase levothyroxine dose to 16mcg.  We will recheck TSH in 6 months.  6. Avitaminosis D Currently stable on vitamin D supplement 1000 units daily.  7. Pure hypercholesterolemia History of this and controlled on lovastatin 40mg . I will check her cholesterol level as below and follow-up pending lab results. If labs are stable she will continue her current dose of lovastatin and I will follow-up with her in one year. - Lipid panel  8. Elevated hemoglobin A1c History of borderline elevated hemoglobin A1c of 5.6. I will recheck her hemoglobin A1c level and follow-up pending results. If labs are within normal limits and stable I will follow-up with her in one year for her repeat annual physical exam. - Hemoglobin A1c   Exercise Activities and Dietary recommendations Goals    None      Immunization History  Administered Date(s) Administered  . Influenza, High Dose Seasonal PF 12/03/2014  . Pneumococcal Polysaccharide-23 03/20/2011  . Td 03/10/2004    Health Maintenance  Topic Date Due  . Hepatitis C Screening  August 30, 1945  . MAMMOGRAM  02/18/1995  . COLONOSCOPY  02/18/1995  . ZOSTAVAX  02/17/2005  . DEXA SCAN  02/17/2010  . PNA vac Low Risk Adult (2 of 2 - PCV13) 03/19/2012  . TETANUS/TDAP  03/10/2014  . INFLUENZA VACCINE  10/22/2015        Discussed health benefits of physical activity, and encouraged her to engage in regular exercise appropriate for her age and condition.    ------------------------------------------------------------------------------------------------------------

## 2015-03-04 NOTE — Patient Instructions (Signed)

## 2015-03-04 NOTE — Addendum Note (Signed)
Addended by: Ashley Royalty E on: 03/04/2015 09:39 AM   Modules accepted: Orders

## 2015-03-05 ENCOUNTER — Telehealth: Payer: Self-pay

## 2015-03-05 LAB — COMPREHENSIVE METABOLIC PANEL
ALK PHOS: 115 IU/L (ref 39–117)
ALT: 26 IU/L (ref 0–32)
AST: 24 IU/L (ref 0–40)
Albumin/Globulin Ratio: 1.9 (ref 1.1–2.5)
Albumin: 4.3 g/dL (ref 3.5–4.8)
BILIRUBIN TOTAL: 0.4 mg/dL (ref 0.0–1.2)
BUN/Creatinine Ratio: 20 (ref 11–26)
BUN: 16 mg/dL (ref 8–27)
CHLORIDE: 98 mmol/L (ref 96–106)
CO2: 24 mmol/L (ref 18–29)
CREATININE: 0.82 mg/dL (ref 0.57–1.00)
Calcium: 9.4 mg/dL (ref 8.7–10.3)
GFR calc Af Amer: 84 mL/min/{1.73_m2} (ref >59)
GFR calc non Af Amer: 73 mL/min/{1.73_m2} (ref >59)
GLUCOSE: 89 mg/dL (ref 65–99)
Globulin, Total: 2.3 g/dL (ref 1.5–4.5)
Potassium: 4.5 mmol/L (ref 3.5–5.2)
Sodium: 141 mmol/L (ref 134–144)
Total Protein: 6.6 g/dL (ref 6.0–8.5)

## 2015-03-05 LAB — CBC WITH DIFFERENTIAL/PLATELET
BASOS ABS: 0 10*3/uL (ref 0.0–0.2)
Basos: 0 %
EOS (ABSOLUTE): 0.1 10*3/uL (ref 0.0–0.4)
Eos: 1 %
Hematocrit: 44.4 % (ref 34.0–46.6)
Hemoglobin: 15.2 g/dL (ref 11.1–15.9)
IMMATURE GRANS (ABS): 0 10*3/uL (ref 0.0–0.1)
Immature Granulocytes: 0 %
LYMPHS: 21 %
Lymphocytes Absolute: 2.1 10*3/uL (ref 0.7–3.1)
MCH: 32.8 pg (ref 26.6–33.0)
MCHC: 34.2 g/dL (ref 31.5–35.7)
MCV: 96 fL (ref 79–97)
MONOCYTES: 9 %
Monocytes Absolute: 0.9 10*3/uL (ref 0.1–0.9)
NEUTROS ABS: 6.6 10*3/uL (ref 1.4–7.0)
Neutrophils: 69 %
PLATELETS: 246 10*3/uL (ref 150–379)
RBC: 4.64 x10E6/uL (ref 3.77–5.28)
RDW: 14 % (ref 12.3–15.4)
WBC: 9.7 10*3/uL (ref 3.4–10.8)

## 2015-03-05 LAB — LIPID PANEL
Chol/HDL Ratio: 4.4 ratio units (ref 0.0–4.4)
Cholesterol, Total: 202 mg/dL — ABNORMAL HIGH (ref 100–199)
HDL: 46 mg/dL (ref >39)
LDL CALC: 108 mg/dL — AB (ref 0–99)
Triglycerides: 239 mg/dL — ABNORMAL HIGH (ref 0–149)
VLDL CHOLESTEROL CAL: 48 mg/dL — AB (ref 5–40)

## 2015-03-05 LAB — HEMOGLOBIN A1C
ESTIMATED AVERAGE GLUCOSE: 128 mg/dL
HEMOGLOBIN A1C: 6.1 % — AB (ref 4.8–5.6)

## 2015-03-05 LAB — TSH: TSH: 6.37 u[IU]/mL — AB (ref 0.450–4.500)

## 2015-03-05 MED ORDER — LEVOTHYROXINE SODIUM 112 MCG PO TABS: 112.0000 ug | ORAL_TABLET | Freq: Every day | ORAL | Status: DC

## 2015-03-05 NOTE — Telephone Encounter (Signed)
-----   Message from Mar Daring, [place] sent at 03/05/2015  9:09 AM EST ----- All labs are within normal limits and stable.  Cholesterol levels are still slightly elevated as is HgBA1c.  Cholesterol levels however have improved since previous blood work. Continue to try to eat a heart healthy diet.  You would benefit in many ways if you would try to add some physical activity.  Even starting at 10-15 minutes 4-5 days per week is better than nothing.  Also your TSH level is elevated at 6.370 which could be causing your fatigue.  I will increase levothyroxine to 129mcg.  This will be sent to your pharmacy.  We will then recheck TSH in 6 months. Thanks! -JB

## 2015-03-05 NOTE — Addendum Note (Signed)
Addended by: Mar Daring on: 03/05/2015 09:11 AM   Modules accepted: Orders, Medications

## 2015-03-05 NOTE — Telephone Encounter (Signed)
Patient advised as directed below. Patient voice understanding.  Thanks,  -Joseline

## 2015-03-10 ENCOUNTER — Other Ambulatory Visit: Payer: Self-pay | Admitting: Family Medicine

## 2015-03-10 DIAGNOSIS — M1990 Unspecified osteoarthritis, unspecified site: Secondary | ICD-10-CM

## 2015-03-10 DIAGNOSIS — I10 Essential (primary) hypertension: Secondary | ICD-10-CM

## 2015-03-31 ENCOUNTER — Other Ambulatory Visit: Payer: Self-pay | Admitting: Family Medicine

## 2015-04-11 ENCOUNTER — Other Ambulatory Visit: Payer: Self-pay | Admitting: Family Medicine

## 2015-04-11 DIAGNOSIS — F419 Anxiety disorder, unspecified: Secondary | ICD-10-CM

## 2015-06-25 ENCOUNTER — Telehealth: Payer: Self-pay | Admitting: Family Medicine

## 2015-06-25 NOTE — Telephone Encounter (Signed)
Usually needs recent ov with notes regarding compliance etc before new machine. Thanks.

## 2015-06-25 NOTE — Telephone Encounter (Signed)
Pt needs new RX for he C Pap machine.  She will use [facility].  She ask that we fax it to them.  She did not have the fax number,  Her call back is [phone]  Thanks, Con Memos

## 2015-06-25 NOTE — Telephone Encounter (Signed)
Advised pt and made OV. Renaldo Fiddler, CMA

## 2015-07-01 ENCOUNTER — Ambulatory Visit: Payer: Self-pay | Admitting: Family Medicine

## 2015-07-08 ENCOUNTER — Other Ambulatory Visit: Payer: Self-pay | Admitting: Family Medicine

## 2015-07-08 DIAGNOSIS — E559 Vitamin D deficiency, unspecified: Secondary | ICD-10-CM

## 2015-07-17 ENCOUNTER — Other Ambulatory Visit: Payer: Self-pay | Admitting: Family Medicine

## 2015-07-17 DIAGNOSIS — F419 Anxiety disorder, unspecified: Secondary | ICD-10-CM

## 2015-09-06 ENCOUNTER — Other Ambulatory Visit: Payer: Self-pay | Admitting: Physician Assistant

## 2015-09-06 DIAGNOSIS — E039 Hypothyroidism, unspecified: Secondary | ICD-10-CM

## 2015-09-09 ENCOUNTER — Other Ambulatory Visit: Payer: Self-pay | Admitting: Family Medicine

## 2015-09-09 DIAGNOSIS — I10 Essential (primary) hypertension: Secondary | ICD-10-CM

## 2015-09-09 DIAGNOSIS — G4700 Insomnia, unspecified: Secondary | ICD-10-CM

## 2015-09-09 DIAGNOSIS — M1990 Unspecified osteoarthritis, unspecified site: Secondary | ICD-10-CM

## 2015-10-11 ENCOUNTER — Other Ambulatory Visit: Payer: Self-pay | Admitting: Family Medicine

## 2015-10-11 DIAGNOSIS — K219 Gastro-esophageal reflux disease without esophagitis: Secondary | ICD-10-CM

## 2016-01-10 ENCOUNTER — Other Ambulatory Visit: Payer: Self-pay | Admitting: Family Medicine

## 2016-01-10 DIAGNOSIS — E559 Vitamin D deficiency, unspecified: Secondary | ICD-10-CM

## 2016-02-03 ENCOUNTER — Telehealth: Payer: Self-pay | Admitting: Physician Assistant

## 2016-02-03 NOTE — Telephone Encounter (Signed)
error 

## 2016-02-06 ENCOUNTER — Encounter: Payer: Self-pay | Admitting: Physician Assistant

## 2016-02-06 ENCOUNTER — Ambulatory Visit (INDEPENDENT_AMBULATORY_CARE_PROVIDER_SITE_OTHER): Payer: PPO | Admitting: Physician Assistant

## 2016-02-06 VITALS — BP 110/82 | HR 82 | Temp 98.8°F | Resp 16

## 2016-02-06 DIAGNOSIS — E782 Mixed hyperlipidemia: Secondary | ICD-10-CM

## 2016-02-06 DIAGNOSIS — E559 Vitamin D deficiency, unspecified: Secondary | ICD-10-CM

## 2016-02-06 DIAGNOSIS — G4733 Obstructive sleep apnea (adult) (pediatric): Secondary | ICD-10-CM

## 2016-02-06 DIAGNOSIS — R7309 Other abnormal glucose: Secondary | ICD-10-CM | POA: Diagnosis not present

## 2016-02-06 DIAGNOSIS — R5383 Other fatigue: Secondary | ICD-10-CM | POA: Diagnosis not present

## 2016-02-06 DIAGNOSIS — T148XXA Other injury of unspecified body region, initial encounter: Secondary | ICD-10-CM | POA: Diagnosis not present

## 2016-02-06 DIAGNOSIS — E039 Hypothyroidism, unspecified: Secondary | ICD-10-CM

## 2016-02-06 MED ORDER — CEPHALEXIN 500 MG PO CAPS: 500.0000 mg | ORAL_CAPSULE | Freq: Two times a day (BID) | ORAL | 0 refills | Status: DC

## 2016-02-06 NOTE — Patient Instructions (Signed)
Exercising to Lose Weight Introduction Exercising can help you to lose weight. In order to lose weight through exercise, you need to do vigorous-intensity exercise. You can tell that you are exercising with vigorous intensity if you are breathing very hard and fast and cannot hold a conversation while exercising. Moderate-intensity exercise helps to maintain your current weight. You can tell that you are exercising at a moderate level if you have a higher heart rate and faster breathing, but you are still able to hold a conversation. How often should I exercise? Choose an activity that you enjoy and set realistic goals. Your health care provider can help you to make an activity plan that works for you. Exercise regularly as directed by your health care provider. This may include:  Doing resistance training twice each week, such as:  Push-ups.  Sit-ups.  Lifting weights.  Using resistance bands.  Doing a given intensity of exercise for a given amount of time. Choose from these options:  150 minutes of moderate-intensity exercise every week.  75 minutes of vigorous-intensity exercise every week.  A mix of moderate-intensity and vigorous-intensity exercise every week. Children, pregnant women, people who are out of shape, people who are overweight, and older adults may need to consult a health care provider for individual recommendations. If you have any sort of medical condition, be sure to consult your health care provider before starting a new exercise program. What are some activities that can help me to lose weight?  Walking at a rate of at least 4.5 miles an hour.  Jogging or running at a rate of 5 miles per hour.  Biking at a rate of at least 10 miles per hour.  Lap swimming.  Roller-skating or in-line skating.  Cross-country skiing.  Vigorous competitive sports, such as football, basketball, and soccer.  Jumping rope.  Aerobic dancing. How can I be more active in my  day-to-day activities?  Use the stairs instead of the elevator.  Take a walk during your lunch break.  If you drive, park your car farther away from work or school.  If you take public transportation, get off one stop early and walk the rest of the way.  Make all of your phone calls while standing up and walking around.  Get up, stretch, and walk around every 30 minutes throughout the day. What guidelines should I follow while exercising?  Do not exercise so much that you hurt yourself, feel dizzy, or get very short of breath.  Consult your health care provider prior to starting a new exercise program.  Wear comfortable clothes and shoes with good support.  Drink plenty of water while you exercise to prevent dehydration or heat stroke. Body water is lost during exercise and must be replaced.  Work out until you breathe faster and your heart beats faster. This information is not intended to replace advice given to you by your health care provider. Make sure you discuss any questions you have with your health care provider. Document Released: 04/11/2010 Document Revised: 08/15/2015 Document Reviewed: 08/10/2013  2017 Elsevier  

## 2016-02-06 NOTE — Progress Notes (Signed)
Patient: Robin Espinoza Female    DOB: 1945-03-26   70 y.o.   MRN: [id number] Visit Date: 02/06/2016  Today's Provider: Mar Daring, PA-C   Chief Complaint  Patient presents with  . Follow-up    sleep apnea   Subjective:    HPI Sleep Apnea: Patient is requesting a order for a new CPAP machine. Patient reports that her pressure is set at 17. Patient reports fatigue, and worsening depression for the last few months. Patient also c/o weight gain and pain all over body. Patient reports she sleeps about 8 hours of sleep but awakes to feel unrested. Patient reports that her humidifier has been broken for several years and does have dry mouth in the morning. She is also starting to get sores on the back of her head and bridge of her nose from her mask not fitting well. She does continue to use her CPAP nightly and if she ever takes a nap.      No Known Allergies   Current Outpatient Prescriptions:  .  CVS D3 1000 units capsule, TAKE ONE CAPSULE BY MOUTH EVERY DAY, Disp: 90 capsule, Rfl: 1 .  levothyroxine (SYNTHROID, LEVOTHROID) 112 MCG tablet, TAKE 1 TABLET (112 MCG TOTAL) BY MOUTH DAILY BEFORE BREAKFAST., Disp: 90 tablet, Rfl: 3 .  lovastatin (MEVACOR) 40 MG tablet, TAKE 1 TABLET BY MOUTH AT BEDTIME, Disp: 180 tablet, Rfl: 2 .  meloxicam (MOBIC) 15 MG tablet, TAKE 1 TABLET BY MOUTH EVERY DAY, Disp: 90 tablet, Rfl: 1 .  metoprolol succinate (TOPROL-XL) 100 MG 24 hr tablet, TAKE 1 TABLET BY MOUTH EVERY DAY, Disp: 90 tablet, Rfl: 1 .  NON FORMULARY, CPAP, Disp: , Rfl:  .  omeprazole (PRILOSEC) 20 MG capsule, TAKE ONE CAPSULE BY MOUTH DAILY, Disp: 90 capsule, Rfl: 1 .  traZODone (DESYREL) 50 MG tablet, TAKE 1 TABLET BY MOUTH ONCE A DAY, Disp: 90 tablet, Rfl: 1 .  venlafaxine XR (EFFEXOR-XR) 75 MG 24 hr capsule, TAKE ONE CAPSULE BY MOUTH ONCE A DAY, Disp: 90 capsule, Rfl: 1 .  FLUZONE HIGH-DOSE 0.5 ML SUSY, TO BE ADMINISTERED BY PHARMACIST FOR IMMUNIZATION, Disp: , Rfl:  0  Review of Systems  Constitutional: Positive for activity change, fatigue and unexpected weight change.  Respiratory: Positive for apnea.   Cardiovascular: Negative for chest pain, palpitations and leg swelling.  Gastrointestinal: Negative for abdominal pain and nausea.  Musculoskeletal: Positive for arthralgias, back pain and neck pain.  Skin: Positive for wound.  Neurological: Positive for headaches.  Psychiatric/Behavioral: Positive for dysphoric mood. The patient is nervous/anxious.     Social History  Substance Use Topics  . Smoking status: Former Research scientist (life sciences)  . Smokeless tobacco: Not on file     Comment: in college  . Alcohol use Yes     Comment: Drinks a glass of wine 1-3 glasses a month   Objective:   BP 110/82 (BP Location: Left Arm, Patient Position: Sitting, Cuff Size: Large)   Pulse 82   Temp 98.8 F (37.1 C) (Oral)   Resp 16   SpO2 95%   Physical Exam  Constitutional: She appears well-developed and well-nourished. No distress.  Neck: Normal range of motion. Neck supple. No JVD present. No tracheal deviation present. No thyromegaly present.  Cardiovascular: Normal rate, regular rhythm and normal heart sounds.  Exam reveals no gallop and no friction rub.   No murmur heard. Pulmonary/Chest: Effort normal and breath sounds normal. No respiratory distress. She has no wheezes.  She has no rales.  Lymphadenopathy:    She has no cervical adenopathy.  Skin: She is not diaphoretic.     Vitals reviewed.       Assessment & Plan:      1. Obstructive apnea CPAP machine is out of date and start to malfunction. Patient is starting to notice that she is not waking up feeling well rested any longer. Humidifier has been broken for a long time per patient. Mask is starting to cause delusions on patient's neck and face. I will order a new CPAP machine and supplies. She is to call the office if she continues to have worsening depression and fatigue symptoms even after getting a new  CPAP machine.  2. Hypothyroidism, unspecified type Will check labs as below and f/u pending results. - Thyroid Panel With TSH  2. Fatigue, unspecified type Most likely secondary to not sleeping well due to malfunctioning CPAP machine. I will check labs as below to rule out any other causes as well. If labs are stable we will update CPAP machine and then I will see her back in approximate 4-6 weeks when she gets a new machine to see if symptoms are improving. - CBC w/Diff/Platelet - Comprehensive Metabolic Panel (CMET)  3. Combined fat and carbohydrate induced hyperlipemia Will check labs as below and f/u pending results. - Lipid panel - Comprehensive Metabolic Panel (CMET)  4. Avitaminosis D History of low vitamin D. Will check labs as below. - Vitamin D (25 hydroxy)  5. Elevated hemoglobin A1c Will check labs as below and f/u pending results. - HgB A1c  6. Abrasion I will give Keflex as below for the wound on the back of the head being that it is not healing and has some surrounding cellulitis. She is to call the office if this does not heal. - cephALEXin (KEFLEX) 500 MG capsule; Take 1 capsule (500 mg total) by mouth 2 (two) times daily.  Dispense: 14 capsule; Refill: 0       Mar Daring, PA-C  [facility]

## 2016-02-07 ENCOUNTER — Telehealth: Payer: Self-pay

## 2016-02-07 LAB — LIPID PANEL
CHOLESTEROL TOTAL: 167 mg/dL (ref 100–199)
Chol/HDL Ratio: 5.2 ratio units — ABNORMAL HIGH (ref 0.0–4.4)
HDL: 32 mg/dL — AB (ref >39)
LDL Calculated: 75 mg/dL (ref 0–99)
Triglycerides: 301 mg/dL — ABNORMAL HIGH (ref 0–149)
VLDL CHOLESTEROL CAL: 60 mg/dL — AB (ref 5–40)

## 2016-02-07 LAB — CBC WITH DIFFERENTIAL/PLATELET
BASOS: 0 %
Basophils Absolute: 0 10*3/uL (ref 0.0–0.2)
EOS (ABSOLUTE): 0.2 10*3/uL (ref 0.0–0.4)
Eos: 3 %
Hematocrit: 42.8 % (ref 34.0–46.6)
Hemoglobin: 14.7 g/dL (ref 11.1–15.9)
IMMATURE GRANS (ABS): 0 10*3/uL (ref 0.0–0.1)
IMMATURE GRANULOCYTES: 0 %
LYMPHS: 19 %
Lymphocytes Absolute: 1 10*3/uL (ref 0.7–3.1)
MCH: 31 pg (ref 26.6–33.0)
MCHC: 34.3 g/dL (ref 31.5–35.7)
MCV: 90 fL (ref 79–97)
MONOCYTES: 10 %
Monocytes Absolute: 0.6 10*3/uL (ref 0.1–0.9)
NEUTROS PCT: 68 %
Neutrophils Absolute: 3.6 10*3/uL (ref 1.4–7.0)
PLATELETS: 171 10*3/uL (ref 150–379)
RBC: 4.74 x10E6/uL (ref 3.77–5.28)
RDW: 13.9 % (ref 12.3–15.4)
WBC: 5.4 10*3/uL (ref 3.4–10.8)

## 2016-02-07 LAB — COMPREHENSIVE METABOLIC PANEL
A/G RATIO: 1.4 (ref 1.2–2.2)
ALBUMIN: 3.9 g/dL (ref 3.5–4.8)
ALT: 25 IU/L (ref 0–32)
AST: 28 IU/L (ref 0–40)
Alkaline Phosphatase: 106 IU/L (ref 39–117)
BILIRUBIN TOTAL: 0.3 mg/dL (ref 0.0–1.2)
BUN / CREAT RATIO: 20 (ref 12–28)
BUN: 16 mg/dL (ref 8–27)
CHLORIDE: 100 mmol/L (ref 96–106)
CO2: 23 mmol/L (ref 18–29)
Calcium: 8.9 mg/dL (ref 8.7–10.3)
Creatinine, Ser: 0.79 mg/dL (ref 0.57–1.00)
GFR calc non Af Amer: 76 mL/min/{1.73_m2} (ref >59)
GFR, EST AFRICAN AMERICAN: 88 mL/min/{1.73_m2} (ref >59)
Globulin, Total: 2.7 g/dL (ref 1.5–4.5)
Glucose: 90 mg/dL (ref 65–99)
POTASSIUM: 4.5 mmol/L (ref 3.5–5.2)
SODIUM: 139 mmol/L (ref 134–144)
TOTAL PROTEIN: 6.6 g/dL (ref 6.0–8.5)

## 2016-02-07 LAB — HEMOGLOBIN A1C
Est. average glucose Bld gHb Est-mCnc: 117 mg/dL
Hgb A1c MFr Bld: 5.7 % — ABNORMAL HIGH (ref 4.8–5.6)

## 2016-02-07 LAB — THYROID PANEL WITH TSH
Free Thyroxine Index: 2.5 (ref 1.2–4.9)
T3 UPTAKE RATIO: 29 % (ref 24–39)
T4, Total: 8.7 ug/dL (ref 4.5–12.0)
TSH: 3.64 u[IU]/mL (ref 0.450–4.500)

## 2016-02-07 LAB — VITAMIN D 25 HYDROXY (VIT D DEFICIENCY, FRACTURES): VIT D 25 HYDROXY: 25.2 ng/mL — AB (ref 30.0–100.0)

## 2016-02-07 NOTE — Telephone Encounter (Signed)
-----   Message from Mar Daring, [place] sent at 02/07/2016  1:48 PM EST ----- Thyroid is stable and WNL currently. Blood counts normal triglycerides are elevated from last year indicating dietary changes. Make sure to eat a healthy diet with limiting carbohydrates and fatty foods. Also tried to increase physical activity. Continue cholesterol-lowering medication vitamin D is borderline low at 25 if you are taking the vitamin D supplement that is listed increase vitamin D to 2000 international units daily instead of the 1000 and we have documented. Hemoglobin A1c improved to 5.7 from 6.1

## 2016-02-07 NOTE — Telephone Encounter (Signed)
Patient was advised. KW 

## 2016-02-10 ENCOUNTER — Telehealth: Payer: Self-pay | Admitting: Physician Assistant

## 2016-02-10 DIAGNOSIS — F329 Major depressive disorder, single episode, unspecified: Secondary | ICD-10-CM

## 2016-02-10 DIAGNOSIS — F32A Depression, unspecified: Secondary | ICD-10-CM

## 2016-02-10 MED ORDER — VENLAFAXINE HCL ER 75 MG PO CP24: 150.0000 mg | ORAL_CAPSULE | Freq: Every day | ORAL | 1 refills | Status: DC

## 2016-02-10 NOTE — Telephone Encounter (Signed)
Pt stated that she still isn't feeling well and she thinks it is due to her CPAP machine. Pt stated that she would like to speak to [facility] about her CPAP machine. Please advise. Thanks TNP

## 2016-02-10 NOTE — Telephone Encounter (Signed)
Pt returned call. Thanks TNP °

## 2016-02-10 NOTE — Telephone Encounter (Signed)
Called and spoke with patient. She is still having decreased energy, severe fatigue, arthralgias and myalgias. She does report having some increased depression symptoms as well. She doesn't want to do anything besides come home and sleep. Labs were fairly unremarkable. Will increase Venlafaxine to 150mg  daily. I will see her back at scheduled visit.

## 2016-02-10 NOTE — Telephone Encounter (Signed)
Called patient and left VM to return call.

## 2016-02-12 ENCOUNTER — Telehealth: Payer: Self-pay | Admitting: Physician Assistant

## 2016-02-12 NOTE — Telephone Encounter (Signed)
Please advise. KW 

## 2016-02-12 NOTE — Telephone Encounter (Signed)
Spoke with patient. She is interested in possibly testing for EBV to see if it may have been reactivated. Patient is going to wait out the weekend and call if symptoms are no better by Monday.

## 2016-02-12 NOTE — Telephone Encounter (Signed)
Pt would like Sonia Baller to call her back about her prior virus.  Would not elaborate  [phone]  Thanks Con Memos

## 2016-02-18 DIAGNOSIS — G4733 Obstructive sleep apnea (adult) (pediatric): Secondary | ICD-10-CM | POA: Diagnosis not present

## 2016-02-19 DIAGNOSIS — Z961 Presence of intraocular lens: Secondary | ICD-10-CM | POA: Diagnosis not present

## 2016-02-20 ENCOUNTER — Other Ambulatory Visit: Payer: Self-pay | Admitting: Physician Assistant

## 2016-02-20 DIAGNOSIS — T148XXA Other injury of unspecified body region, initial encounter: Secondary | ICD-10-CM

## 2016-03-04 ENCOUNTER — Other Ambulatory Visit: Payer: Self-pay | Admitting: Family Medicine

## 2016-03-04 DIAGNOSIS — I10 Essential (primary) hypertension: Secondary | ICD-10-CM

## 2016-03-05 ENCOUNTER — Other Ambulatory Visit: Payer: Self-pay | Admitting: Family Medicine

## 2016-03-05 DIAGNOSIS — M1990 Unspecified osteoarthritis, unspecified site: Secondary | ICD-10-CM

## 2016-03-06 ENCOUNTER — Other Ambulatory Visit: Payer: Self-pay | Admitting: Family Medicine

## 2016-03-06 DIAGNOSIS — G4700 Insomnia, unspecified: Secondary | ICD-10-CM

## 2016-03-06 NOTE — Telephone Encounter (Signed)
Please review-aa 

## 2016-03-10 ENCOUNTER — Ambulatory Visit (INDEPENDENT_AMBULATORY_CARE_PROVIDER_SITE_OTHER): Payer: PPO | Admitting: Physician Assistant

## 2016-03-10 ENCOUNTER — Encounter: Payer: Self-pay | Admitting: Physician Assistant

## 2016-03-10 VITALS — BP 150/88 | HR 68 | Temp 97.7°F | Resp 16 | Ht 66.0 in | Wt 263.4 lb

## 2016-03-10 DIAGNOSIS — H6123 Impacted cerumen, bilateral: Secondary | ICD-10-CM

## 2016-03-10 DIAGNOSIS — Z1231 Encounter for screening mammogram for malignant neoplasm of breast: Secondary | ICD-10-CM

## 2016-03-10 DIAGNOSIS — Z0000 Encounter for general adult medical examination without abnormal findings: Secondary | ICD-10-CM | POA: Diagnosis not present

## 2016-03-10 DIAGNOSIS — Z1239 Encounter for other screening for malignant neoplasm of breast: Secondary | ICD-10-CM

## 2016-03-10 NOTE — Progress Notes (Signed)
Patient: Robin Espinoza, Female    DOB: 1946/02/03, 70 y.o.   MRN: [id number] Visit Date: 03/10/2016  Today's Provider: Mar Daring, PA-C   No chief complaint on file.  Subjective:    Annual wellness visit Robin Espinoza is a 70 y.o. female. She feels fairly well. She reports exercising none. She reports she is sleeping fairly well.  Last CPE:03/04/15 Colonoscopy: 03/20/13-Diverticula and Internal Hemorrhoids.Repeat 5-10 years BMD:08/01/2012-Osteopenia in femur bilaterally.Repeat in 2-3 years. -----------------------------------------------------------   Review of Systems  Constitutional: Positive for fatigue.  HENT: Negative.   Eyes: Negative.   Respiratory: Negative.   Cardiovascular: Negative.   Gastrointestinal: Negative.   Endocrine: Negative.   Genitourinary: Negative.   Musculoskeletal: Negative.   Skin: Negative.   Allergic/Immunologic: Negative.   Neurological: Negative.   Hematological: Negative.   Psychiatric/Behavioral: The patient is nervous/anxious (Improving with the increased Effexor).     Social History   Social History  . Marital status: Married    Spouse name: N/A  . Number of children: N/A  . Years of education: N/A   Occupational History  . Not on file.   Social History Main Topics  . Smoking status: Former Research scientist (life sciences)  . Smokeless tobacco: Not on file     Comment: in college  . Alcohol use Yes     Comment: Drinks a glass of wine 1-3 glasses a month  . Drug use: No  . Sexual activity: Not on file   Other Topics Concern  . Not on file   Social History Narrative  . No narrative on file    Past Medical History:  Diagnosis Date  . Anxiety   . GERD (gastroesophageal reflux disease)   . Hyperlipidemia 12/21/2005  . Hypertension   . Insomnia   . Obstructive sleep apnea   . Osteoarthrosis   . Vitamin D deficiency      Patient Active Problem List   Diagnosis Date Noted  . Avitaminosis D 02/28/2015  . Hypothyroidism  11/13/2014  . Anxiety disorder 10/15/2014  . Insomnia 09/21/2014  . Acid reflux 11/30/2007  . Arthritis, degenerative 09/16/2007  . Fam hx-ischem heart disease 02/12/2007  . Adiposity 02/09/2007  . Obstructive apnea 05/06/2006  . 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 BP (high blood pressure) 03/23/1998    Past Surgical History:  Procedure Laterality Date  . TUBAL LIGATION  1981    Her family history includes Diabetes in her father.      Current Outpatient Prescriptions:  .  cephALEXin (KEFLEX) 500 MG capsule, Take 1 capsule (500 mg total) by mouth 2 (two) times daily., Disp: 14 capsule, Rfl: 0 .  [employer] D3 1000 units capsule, TAKE ONE CAPSULE BY MOUTH EVERY DAY, Disp: 90 capsule, Rfl: 1 .  levothyroxine (SYNTHROID, LEVOTHROID) 112 MCG tablet, TAKE 1 TABLET (112 MCG TOTAL) BY MOUTH DAILY BEFORE BREAKFAST., Disp: 90 tablet, Rfl: 3 .  lovastatin (MEVACOR) 40 MG tablet, TAKE 1 TABLET BY MOUTH AT BEDTIME, Disp: 180 tablet, Rfl: 2 .  meloxicam (MOBIC) 15 MG tablet, TAKE 1 TABLET BY MOUTH EVERY DAY, Disp: 90 tablet, Rfl: 1 .  metoprolol succinate (TOPROL-XL) 100 MG 24 hr tablet, TAKE 1 TABLET BY MOUTH EVERY DAY, Disp: 90 tablet, Rfl: 1 .  NON FORMULARY, CPAP, Disp: , Rfl:  .  omeprazole (PRILOSEC) 20 MG capsule, TAKE ONE CAPSULE BY MOUTH DAILY, Disp: 90 capsule, Rfl: 1 .  traZODone (DESYREL) 50 MG tablet, TAKE 1 TABLET BY MOUTH  ONCE A DAY, Disp: 90 tablet, Rfl: 1 .  venlafaxine XR (EFFEXOR-XR) 75 MG 24 hr capsule, Take 2 capsules (150 mg total) by mouth daily with breakfast., Disp: 180 capsule, Rfl: 1  Patient Care Team: Mar Daring, PA-C as PCP - General (Family Medicine)     Objective:   Vitals: BP (!) 150/88 (BP Location: Right Wrist, Patient Position: Sitting, Cuff Size: Normal)   Pulse 68   Temp 97.7 F (36.5 C) (Oral)   Resp 16   Ht 5\' 6"  (1.676 m)   Wt 263 lb 6.4 oz (119.5 kg)   BMI 42.51 kg/m   Physical Exam  Constitutional: She is  oriented to person, place, and time. She appears well-developed and well-nourished. No distress.  HENT:  Head: Normocephalic and atraumatic.  Right Ear: Hearing, tympanic membrane, external ear and ear canal normal.  Left Ear: Hearing, tympanic membrane, external ear and ear canal normal.  Nose: Nose normal.  Mouth/Throat: Uvula is midline, oropharynx is clear and moist and mucous membranes are normal. No oropharyngeal exudate.  Cerumen impaction was noted bilaterally. Ear lavage was performed bilaterally and was successful. Tympanic membranes were visualized and were normal.  Eyes: Conjunctivae and EOM are normal. Pupils are equal, round, and reactive to light. Right eye exhibits no discharge. Left eye exhibits no discharge. No scleral icterus.  Neck: Normal range of motion. Neck supple. No JVD present. No tracheal deviation present. No thyromegaly present.  Cardiovascular: Normal rate, regular rhythm, normal heart sounds and intact distal pulses.  Exam reveals no gallop and no friction rub.   No murmur heard. Pulmonary/Chest: Effort normal and breath sounds normal. No respiratory distress. She has no wheezes. She has no rales. She exhibits no tenderness.  Abdominal: Soft. Bowel sounds are normal. She exhibits no distension and no mass. There is no tenderness. There is no rebound and no guarding.  Musculoskeletal: Normal range of motion. She exhibits no edema or tenderness.  Lymphadenopathy:    She has no cervical adenopathy.  Neurological: She is alert and oriented to person, place, and time.  Skin: Skin is warm and dry. No rash noted. She is not diaphoretic.  Psychiatric: She has a normal mood and affect. Her behavior is normal. Judgment and thought content normal.  Vitals reviewed.   Activities of Daily Living In your present state of health, do you have any difficulty performing the following activities: 03/10/2016  Hearing? N  Vision? N  Difficulty concentrating or making decisions?  N  Walking or climbing stairs? Y  Dressing or bathing? N  Doing errands, shopping? N  Some recent data might be hidden    Fall Risk Assessment Fall Risk  03/10/2016 03/04/2015  Falls in the past year? No No     Depression Screen PHQ 2/9 Scores 03/10/2016 03/04/2015  PHQ - 2 Score 0 0    Cognitive Testing - 6-CIT  Correct? Score   What year is it? yes 0 0 or 4  What month is it? yes 0 0 or 3  Memorize:    Pia Mau,  41,  [place],      What time is it? (within 1 hour) yes 0 0 or 3  Count backwards from 20 yes 0 0, 2, or 4  Name the months of the year yes 0 0, 2, or 4  Repeat name & address above no 3 0, 2, 4, 6, 8, or 10       TOTAL SCORE  3/28  Interpretation:  Normal  Normal (0-7) Abnormal (8-28)   Audit-C Alcohol Use Screening  Question Answer Points  How often do you have alcoholic drink? 1 times monthly 1  On days you do drink alcohol, how many drinks do you typically consume? 1-2 0  How oftey will you drink 6 or more in a total? never 0  Total Score:  1   A score of 3 or more in women, and 4 or more in men indicates increased risk for alcohol abuse, EXCEPT if all of the points are from question 1.   Assessment & Plan:     Annual Wellness Visit  Reviewed patient's Family Medical History Reviewed and updated list of patient's medical providers Assessment of cognitive impairment was done Assessed patient's functional ability Established a written schedule for health screening [place] Completed and Reviewed  Exercise Activities and Dietary recommendations Goals    None      Immunization History  Administered Date(s) Administered  . Influenza, High Dose Seasonal PF 12/03/2014  . Influenza-Unspecified 01/11/2016  . Pneumococcal Polysaccharide-23 03/20/2011  . Td 03/10/2004    Health Maintenance  Topic Date Due  . Hepatitis C Screening  1945/12/27  . MAMMOGRAM  02/18/1995  . COLONOSCOPY  02/18/1995  .  ZOSTAVAX  02/17/2005  . PNA vac Low Risk Adult (2 of 2 - PCV13) 03/19/2012  . TETANUS/TDAP  03/10/2014  . INFLUENZA VACCINE  Completed  . DEXA SCAN  Completed     Discussed health benefits of physical activity, and encouraged her to engage in regular exercise appropriate for her age and condition.    1. Medicare annual wellness visit, subsequent Normal physical exam today. Labs were done in November and reviewed again today with the patient. I will see her back in 6 months for her chronic disease follow-up.  2. Breast cancer screening Breast exam deferred today. Mammogram was ordered as below. Information for [facility] breast clinic was given to patient so that she may call and schedule her mammogram at her convenience. - MM Digital Screening; Future  3. Bilateral impacted cerumen Ear lavage was successful. Tympanic membranes were visualized following lavage and were normal. - Ear Lavage  The entirety of the information documented in the History of Present Illness, Review of Systems and Physical Exam were personally obtained by me. Portions of this information were initially documented by Lyndel Pleasure, CMA and reviewed by me for thoroughness and accuracy.  This office note has been dictated so please excuse any grammatical or typographical errors. ------------------------------------------------------------------------------------------------------------    Mar Daring, PA-C  [facility]

## 2016-03-10 NOTE — Patient Instructions (Signed)

## 2016-03-13 ENCOUNTER — Telehealth: Payer: Self-pay | Admitting: Physician Assistant

## 2016-03-13 ENCOUNTER — Other Ambulatory Visit: Payer: Self-pay

## 2016-03-13 DIAGNOSIS — F329 Major depressive disorder, single episode, unspecified: Secondary | ICD-10-CM

## 2016-03-13 DIAGNOSIS — F32A Depression, unspecified: Secondary | ICD-10-CM

## 2016-03-13 MED ORDER — VENLAFAXINE HCL ER 75 MG PO CP24: 150.0000 mg | ORAL_CAPSULE | Freq: Every day | ORAL | 5 refills | Status: DC

## 2016-03-13 NOTE — Telephone Encounter (Signed)
Needed 30 day instead of 90 day refill of Effexor

## 2016-03-13 NOTE — Telephone Encounter (Signed)
Error

## 2016-03-13 NOTE — Telephone Encounter (Signed)
error 

## 2016-03-18 ENCOUNTER — Other Ambulatory Visit: Payer: Self-pay | Admitting: Physician Assistant

## 2016-03-18 DIAGNOSIS — K219 Gastro-esophageal reflux disease without esophagitis: Secondary | ICD-10-CM

## 2016-03-18 NOTE — Telephone Encounter (Signed)
Had wellness with you 03/10/2016. Renaldo Fiddler, CMA

## 2016-03-19 ENCOUNTER — Telehealth: Payer: Self-pay

## 2016-03-19 DIAGNOSIS — G4733 Obstructive sleep apnea (adult) (pediatric): Secondary | ICD-10-CM | POA: Diagnosis not present

## 2016-03-19 DIAGNOSIS — E559 Vitamin D deficiency, unspecified: Secondary | ICD-10-CM

## 2016-03-19 MED ORDER — CHOLECALCIFEROL 25 MCG (1000 UT) PO CAPS: 2000.0000 [IU] | ORAL_CAPSULE | Freq: Every day | ORAL | 1 refills | Status: DC

## 2016-03-19 NOTE — Telephone Encounter (Signed)
Refill sent.

## 2016-03-19 NOTE — Telephone Encounter (Signed)
Patient is requesting a new Rx for Vitamin D. Per Pharmacy comments patient states she is doubling dose. New Rx request to reflect the change in therapy.  Thanks,  -Kendyl Bissonnette

## 2016-04-10 ENCOUNTER — Other Ambulatory Visit: Payer: Self-pay | Admitting: Family Medicine

## 2016-04-10 DIAGNOSIS — F419 Anxiety disorder, unspecified: Secondary | ICD-10-CM

## 2016-04-10 NOTE — Telephone Encounter (Signed)
Please review-aa 

## 2016-04-19 DIAGNOSIS — G4733 Obstructive sleep apnea (adult) (pediatric): Secondary | ICD-10-CM | POA: Diagnosis not present

## 2016-05-20 DIAGNOSIS — G4733 Obstructive sleep apnea (adult) (pediatric): Secondary | ICD-10-CM | POA: Diagnosis not present

## 2016-05-26 DIAGNOSIS — L820 Inflamed seborrheic keratosis: Secondary | ICD-10-CM | POA: Diagnosis not present

## 2016-05-26 DIAGNOSIS — D2272 Melanocytic nevi of left lower limb, including hip: Secondary | ICD-10-CM | POA: Diagnosis not present

## 2016-05-26 DIAGNOSIS — L821 Other seborrheic keratosis: Secondary | ICD-10-CM | POA: Diagnosis not present

## 2016-05-26 DIAGNOSIS — L538 Other specified erythematous conditions: Secondary | ICD-10-CM | POA: Diagnosis not present

## 2016-05-26 DIAGNOSIS — R58 Hemorrhage, not elsewhere classified: Secondary | ICD-10-CM | POA: Diagnosis not present

## 2016-05-26 DIAGNOSIS — L570 Actinic keratosis: Secondary | ICD-10-CM | POA: Diagnosis not present

## 2016-05-26 DIAGNOSIS — D225 Melanocytic nevi of trunk: Secondary | ICD-10-CM | POA: Diagnosis not present

## 2016-05-26 DIAGNOSIS — D2261 Melanocytic nevi of right upper limb, including shoulder: Secondary | ICD-10-CM | POA: Diagnosis not present

## 2016-05-26 DIAGNOSIS — X32XXXA Exposure to sunlight, initial encounter: Secondary | ICD-10-CM | POA: Diagnosis not present

## 2016-05-26 DIAGNOSIS — D1801 Hemangioma of skin and subcutaneous tissue: Secondary | ICD-10-CM | POA: Diagnosis not present

## 2016-06-08 ENCOUNTER — Other Ambulatory Visit: Payer: Self-pay | Admitting: Physician Assistant

## 2016-06-08 DIAGNOSIS — E559 Vitamin D deficiency, unspecified: Secondary | ICD-10-CM

## 2016-06-08 NOTE — Telephone Encounter (Signed)
Last ov 03/10/16 Last filled 03/19/16 Please review. Thank you. sd

## 2016-06-17 DIAGNOSIS — G4733 Obstructive sleep apnea (adult) (pediatric): Secondary | ICD-10-CM | POA: Diagnosis not present

## 2016-07-18 DIAGNOSIS — G4733 Obstructive sleep apnea (adult) (pediatric): Secondary | ICD-10-CM | POA: Diagnosis not present

## 2016-08-17 DIAGNOSIS — G4733 Obstructive sleep apnea (adult) (pediatric): Secondary | ICD-10-CM | POA: Diagnosis not present

## 2016-08-30 ENCOUNTER — Other Ambulatory Visit: Payer: Self-pay | Admitting: Physician Assistant

## 2016-08-30 DIAGNOSIS — M1990 Unspecified osteoarthritis, unspecified site: Secondary | ICD-10-CM

## 2016-08-30 DIAGNOSIS — G4700 Insomnia, unspecified: Secondary | ICD-10-CM

## 2016-09-05 ENCOUNTER — Other Ambulatory Visit: Payer: Self-pay | Admitting: Physician Assistant

## 2016-09-05 DIAGNOSIS — F329 Major depressive disorder, single episode, unspecified: Secondary | ICD-10-CM

## 2016-09-05 DIAGNOSIS — F32A Depression, unspecified: Secondary | ICD-10-CM

## 2016-09-11 ENCOUNTER — Other Ambulatory Visit: Payer: Self-pay | Admitting: Physician Assistant

## 2016-09-11 DIAGNOSIS — E039 Hypothyroidism, unspecified: Secondary | ICD-10-CM

## 2016-09-11 DIAGNOSIS — I10 Essential (primary) hypertension: Secondary | ICD-10-CM

## 2016-09-11 DIAGNOSIS — K219 Gastro-esophageal reflux disease without esophagitis: Secondary | ICD-10-CM

## 2016-09-17 DIAGNOSIS — G4733 Obstructive sleep apnea (adult) (pediatric): Secondary | ICD-10-CM | POA: Diagnosis not present

## 2016-10-17 DIAGNOSIS — G4733 Obstructive sleep apnea (adult) (pediatric): Secondary | ICD-10-CM | POA: Diagnosis not present

## 2016-10-20 ENCOUNTER — Other Ambulatory Visit: Payer: Self-pay | Admitting: Physician Assistant

## 2016-10-20 DIAGNOSIS — E559 Vitamin D deficiency, unspecified: Secondary | ICD-10-CM

## 2016-10-20 NOTE — Telephone Encounter (Signed)
Last ov 03/10/16  Last filled 06/08/16 Please review. Thank you. sd

## 2016-11-02 DIAGNOSIS — G4733 Obstructive sleep apnea (adult) (pediatric): Secondary | ICD-10-CM | POA: Diagnosis not present

## 2016-11-06 ENCOUNTER — Encounter: Payer: Self-pay | Admitting: Physician Assistant

## 2016-11-17 DIAGNOSIS — G4733 Obstructive sleep apnea (adult) (pediatric): Secondary | ICD-10-CM | POA: Diagnosis not present

## 2016-12-18 DIAGNOSIS — G4733 Obstructive sleep apnea (adult) (pediatric): Secondary | ICD-10-CM | POA: Diagnosis not present

## 2017-01-17 DIAGNOSIS — G4733 Obstructive sleep apnea (adult) (pediatric): Secondary | ICD-10-CM | POA: Diagnosis not present

## 2017-01-19 ENCOUNTER — Other Ambulatory Visit: Payer: Self-pay | Admitting: Physician Assistant

## 2017-01-19 DIAGNOSIS — G4700 Insomnia, unspecified: Secondary | ICD-10-CM

## 2017-02-02 ENCOUNTER — Telehealth: Payer: Self-pay | Admitting: Physician Assistant

## 2017-02-02 ENCOUNTER — Telehealth: Payer: Self-pay

## 2017-02-02 NOTE — Telephone Encounter (Signed)
LMTCB and schdeule AWV with NHA and physical with PCP. -MM

## 2017-02-02 NOTE — Telephone Encounter (Signed)
Pt is requesting an order for CPAP machine sent to [facility] so they will cover the new machine she has. Please advise. Thanks TNP

## 2017-02-03 NOTE — Telephone Encounter (Signed)
LMTCB to schedule a face to face OV for new cpap order.

## 2017-02-03 NOTE — Telephone Encounter (Signed)
She is going to need a face to face appt for this if I am not mistaken. Patient needs to be seen within 6 months for face to face for prescriptions to be valid. She was last seen 03/10/16 and seen 02/06/16 for OSA.

## 2017-02-05 ENCOUNTER — Ambulatory Visit: Payer: PPO | Admitting: Physician Assistant

## 2017-02-08 ENCOUNTER — Ambulatory Visit: Payer: PPO | Admitting: Physician Assistant

## 2017-02-17 DIAGNOSIS — G4733 Obstructive sleep apnea (adult) (pediatric): Secondary | ICD-10-CM | POA: Diagnosis not present

## 2017-02-21 ENCOUNTER — Other Ambulatory Visit: Payer: Self-pay | Admitting: Physician Assistant

## 2017-02-21 DIAGNOSIS — F32A Depression, unspecified: Secondary | ICD-10-CM

## 2017-02-21 DIAGNOSIS — F329 Major depressive disorder, single episode, unspecified: Secondary | ICD-10-CM

## 2017-02-27 ENCOUNTER — Other Ambulatory Visit: Payer: Self-pay | Admitting: Physician Assistant

## 2017-02-27 DIAGNOSIS — M1990 Unspecified osteoarthritis, unspecified site: Secondary | ICD-10-CM

## 2017-03-01 ENCOUNTER — Other Ambulatory Visit: Payer: Self-pay | Admitting: Physician Assistant

## 2017-03-01 DIAGNOSIS — K219 Gastro-esophageal reflux disease without esophagitis: Secondary | ICD-10-CM

## 2017-03-01 DIAGNOSIS — I10 Essential (primary) hypertension: Secondary | ICD-10-CM

## 2017-03-01 DIAGNOSIS — E039 Hypothyroidism, unspecified: Secondary | ICD-10-CM

## 2017-03-05 ENCOUNTER — Other Ambulatory Visit: Payer: Self-pay

## 2017-03-08 ENCOUNTER — Ambulatory Visit (INDEPENDENT_AMBULATORY_CARE_PROVIDER_SITE_OTHER): Payer: PPO

## 2017-03-08 VITALS — BP 170/100 | HR 68 | Temp 98.8°F | Ht 66.0 in | Wt 278.0 lb

## 2017-03-08 DIAGNOSIS — Z23 Encounter for immunization: Secondary | ICD-10-CM | POA: Diagnosis not present

## 2017-03-08 DIAGNOSIS — Z0000 Encounter for general adult medical examination without abnormal findings: Secondary | ICD-10-CM

## 2017-03-08 NOTE — Patient Instructions (Signed)
Robin Espinoza , Thank you for taking time to come for your Medicare Wellness Visit. I appreciate your ongoing commitment to your health goals. Please review the following plan we discussed and let me know if I can assist you in the future.   Screening recommendations/referrals: Colonoscopy: up to date Mammogram: pt to schedule this by the end of 2018 or beginning of 2019 Bone Density: up to date Recommended yearly ophthalmology/optometry visit for glaucoma screening and checkup Recommended yearly dental visit for hygiene and checkup  Vaccinations: Influenza vaccine: up to date Pneumococcal vaccine: completed today Tdap vaccine: declined Shingles vaccine: pt is receiving this from pharmacy  Advanced directives: Please bring a copy of your POA (Power of [place]) and/or Living Will to your next appointment.   Conditions/risks identified: Obesity- recommend cutting portion sizes in half and eating 3 Bonini meals a day with 2 healthy snack a day.   Next appointment: 03/11/17 @ 3:00 PM   Preventive Care 65 Years and Older, Female Preventive care refers to lifestyle choices and visits with your health care provider that can promote health and wellness. What does preventive care include?  A yearly physical exam. This is also called an annual well check.  Dental exams once or twice a year.  Routine eye exams. Ask your health care provider how often you should have your eyes checked.  Personal lifestyle choices, including:  Daily care of your teeth and gums.  Regular physical activity.  Eating a healthy diet.  Avoiding tobacco and drug use.  Limiting alcohol use.  Practicing safe sex.  Taking low-dose aspirin every day.  Taking vitamin and mineral supplements as recommended by your health care provider. What happens during an annual well check? The services and screenings done by your health care provider during your annual well check will depend on your age, overall health,  lifestyle risk factors, and family history of disease. Counseling  Your health care provider may ask you questions about your:  Alcohol use.  Tobacco use.  Drug use.  Emotional well-being.  Home and relationship well-being.  Sexual activity.  Eating habits.  History of falls.  Memory and ability to understand (cognition).  Work and work Statistician.  Reproductive health. Screening  You may have the following tests or measurements:  Height, weight, and BMI.  Blood pressure.  Lipid and cholesterol levels. These may be checked every 5 years, or more frequently if you are over 48 years old.  Skin check.  Lung cancer screening. You may have this screening every year starting at age 45 if you have a 30-pack-year history of smoking and currently smoke or have quit within the past 15 years.  Fecal occult blood test (FOBT) of the stool. You may have this test every year starting at age 82.  Flexible sigmoidoscopy or colonoscopy. You may have a sigmoidoscopy every 5 years or a colonoscopy every 10 years starting at age 52.  Hepatitis C blood test.  Hepatitis B blood test.  Sexually transmitted disease (STD) testing.  Diabetes screening. This is done by checking your blood sugar (glucose) after you have not eaten for a while (fasting). You may have this done every 1-3 years.  Bone density scan. This is done to screen for osteoporosis. You may have this done starting at age 64.  Mammogram. This may be done every 1-2 years. Talk to your health care provider about how often you should have regular mammograms. Talk with your health care provider about your test results, treatment options, and  if necessary, the need for more tests. Vaccines  Your health care provider may recommend certain vaccines, such as:  Influenza vaccine. This is recommended every year.  Tetanus, diphtheria, and acellular pertussis (Tdap, Td) vaccine. You may need a Td booster every 10 years.  Zoster  vaccine. You may need this after age 60.  Pneumococcal 13-valent conjugate (PCV13) vaccine. One dose is recommended after age 9.  Pneumococcal polysaccharide (PPSV23) vaccine. One dose is recommended after age 73. Talk to your health care provider about which screenings and vaccines you need and how often you need them. This information is not intended to replace advice given to you by your health care provider. Make sure you discuss any questions you have with your health care provider. Document Released: 04/05/2015 Document Revised: 11/27/2015 Document Reviewed: 01/08/2015 Elsevier Interactive Patient Education  2017 [place] Prevention in the Home Falls can cause injuries. They can happen to people of all ages. There are many things you can do to make your home safe and to help prevent falls. What can I do on the outside of my home?  Regularly fix the edges of walkways and driveways and fix any cracks.  Remove anything that might make you trip as you walk through a door, such as a raised step or threshold.  Trim any bushes or trees on the path to your home.  Use bright outdoor lighting.  Clear any walking paths of anything that might make someone trip, such as rocks or tools.  Regularly check to see if handrails are loose or broken. Make sure that both sides of any steps have handrails.  Any raised decks and porches should have guardrails on the edges.  Have any leaves, snow, or ice cleared regularly.  Use sand or salt on walking paths during winter.  Clean up any spills in your garage right away. This includes oil or grease spills. What can I do in the bathroom?  Use night lights.  Install grab bars by the toilet and in the tub and shower. Do not use towel bars as grab bars.  Use non-skid mats or decals in the tub or shower.  If you need to sit down in the shower, use a plastic, non-slip stool.  Keep the floor dry. Clean up any water that spills on the  floor as soon as it happens.  Remove soap buildup in the tub or shower regularly.  Attach bath mats securely with double-sided non-slip rug tape.  Do not have throw rugs and other things on the floor that can make you trip. What can I do in the bedroom?  Use night lights.  Make sure that you have a light by your bed that is easy to reach.  Do not use any sheets or blankets that are too big for your bed. They should not hang down onto the floor.  Have a firm chair that has side arms. You can use this for support while you get dressed.  Do not have throw rugs and other things on the floor that can make you trip. What can I do in the kitchen?  Clean up any spills right away.  Avoid walking on wet floors.  Keep items that you use a lot in easy-to-reach places.  If you need to reach something above you, use a strong step stool that has a grab bar.  Keep electrical cords out of the way.  Do not use floor polish or wax that makes floors slippery. If you  must use wax, use non-skid floor wax.  Do not have throw rugs and other things on the floor that can make you trip. What can I do with my stairs?  Do not leave any items on the stairs.  Make sure that there are handrails on both sides of the stairs and use them. Fix handrails that are broken or loose. Make sure that handrails are as long as the stairways.  Check any carpeting to make sure that it is firmly attached to the stairs. Fix any carpet that is loose or worn.  Avoid having throw rugs at the top or bottom of the stairs. If you do have throw rugs, attach them to the floor with carpet tape.  Make sure that you have a light switch at the top of the stairs and the bottom of the stairs. If you do not have them, ask someone to add them for you. What else can I do to help prevent falls?  Wear shoes that:  Do not have high heels.  Have rubber bottoms.  Are comfortable and fit you well.  Are closed at the toe. Do not wear  sandals.  If you use a stepladder:  Make sure that it is fully opened. Do not climb a closed stepladder.  Make sure that both sides of the stepladder are locked into place.  Ask someone to hold it for you, if possible.  Clearly mark and make sure that you can see:  Any grab bars or handrails.  First and last steps.  Where the edge of each step is.  Use tools that help you move around (mobility aids) if they are needed. These include:  Canes.  Walkers.  Scooters.  Crutches.  Turn on the lights when you go into a dark area. Replace any light bulbs as soon as they burn out.  Set up your furniture so you have a clear path. Avoid moving your furniture around.  If any of your floors are uneven, fix them.  If there are any pets around you, be aware of where they are.  Review your medicines with your doctor. Some medicines can make you feel dizzy. This can increase your chance of falling. Ask your doctor what other things that you can do to help prevent falls. This information is not intended to replace advice given to you by your health care provider. Make sure you discuss any questions you have with your health care provider. Document Released: 01/03/2009 Document Revised: 08/15/2015 Document Reviewed: 04/13/2014 Elsevier Interactive Patient Education  2017 [employer].

## 2017-03-08 NOTE — Progress Notes (Signed)
Subjective:   Robin Espinoza is a 71 y.o. female who presents for Medicare Annual (Subsequent) preventive examination.  Review of Systems:  N/A  Cardiac Risk Factors include: advanced age (>27men, >13 women);hypertension;obesity (BMI >30kg/m2)     Objective:     Vitals: BP (!) 170/100 (BP Location: Left Arm)   Pulse 68   Temp 98.8 F (37.1 C) (Oral)   Ht 5\' 6"  (1.676 m)   Wt 278 lb (126.1 kg)   BMI 44.87 kg/m   Body mass index is 44.87 kg/m.  Advanced Directives 03/08/2017  Does Patient Have a Medical Advance Directive? Yes    Tobacco Social History   Tobacco Use  Smoking Status Former Smoker  Smokeless Tobacco Never Used  Tobacco Comment   in college     Counseling given: Not Answered Comment: in college   Clinical Intake:  Pre-visit preparation completed: Yes  Pain : No/denies pain Pain Score: 0-No pain     Nutritional Status: BMI > 30  Obese Nutritional Risks: None Diabetes: No  How often do you need to have someone help you when you read instructions, pamphlets, or other written materials from your doctor or pharmacy?: 1 - Never  Interpreter Needed?: No  Information entered by :: [facility], LPN  Past Medical History:  Diagnosis Date  . Anxiety   . GERD (gastroesophageal reflux disease)   . Hyperlipidemia 12/21/2005  . Hypertension   . Insomnia   . Obstructive sleep apnea   . Osteoarthrosis   . Vitamin D deficiency    Past Surgical History:  Procedure Laterality Date  . TUBAL LIGATION  1981   Family History  Problem Relation Age of Onset  . Diabetes Father    Social History   Socioeconomic History  . Marital status: Married    Spouse name: None  . Number of children: 3  . Years of education: None  . Highest education level: None  Social Needs  . Financial resource strain: Not hard at all  . Food insecurity - worry: Never true  . Food insecurity - inability: Never true  . Transportation needs - medical: No  .  Transportation needs - non-medical: No  Occupational History  . Occupation: retired  Tobacco Use  . Smoking status: Former Research scientist (life sciences)  . Smokeless tobacco: Never Used  . Tobacco comment: in college  Substance and Sexual Activity  . Alcohol use: Yes    Frequency: Never    Comment: maybe 1 drink a month  . Drug use: No  . Sexual activity: None  Other Topics Concern  . None  Social History Narrative  . None    Outpatient Encounter Medications as of 03/08/2017  Medication Sig  . FLUZONE HIGH-DOSE 0.5 ML injection TO BE ADMINISTERED BY PHARMACIST FOR IMMUNIZATION  . levothyroxine (SYNTHROID, LEVOTHROID) 112 MCG tablet TAKE 1 TABLET (112 MCG TOTAL) BY MOUTH DAILY BEFORE BREAKFAST.  Marland Kitchen lovastatin (MEVACOR) 40 MG tablet TAKE 1 TABLET BY MOUTH AT BEDTIME  . meloxicam (MOBIC) 15 MG tablet TAKE 1 TABLET BY MOUTH EVERY DAY  . metoprolol succinate (TOPROL-XL) 100 MG 24 hr tablet TAKE 1 TABLET BY MOUTH EVERY DAY  . NON FORMULARY CPAP  . omeprazole (PRILOSEC) 20 MG capsule TAKE ONE CAPSULE BY MOUTH EVERY DAY  . traZODone (DESYREL) 50 MG tablet TAKE 1 TABLET BY MOUTH ONCE A DAY  . venlafaxine XR (EFFEXOR-XR) 75 MG 24 hr capsule TAKE 2 CAPSULES (150 MG TOTAL) BY MOUTH DAILY WITH BREAKFAST.  . [facility] D3 1000  units capsule TAKE ONE CAPSULE BY MOUTH EVERY DAY (Patient not taking: Reported on 03/08/2017)   No facility-administered encounter medications on file as of 03/08/2017.     Activities of Daily Living In your present state of health, do you have any difficulty performing the following activities: 03/08/2017 03/10/2016  Hearing? N N  Vision? N N  Difficulty concentrating or making decisions? Y N  Walking or climbing stairs? N Y  Dressing or bathing? N N  Doing errands, shopping? N N  Preparing Food and eating ? N -  Using the Toilet? N -  In the past six months, have you accidently leaked urine? Y -  Comment occasionally, does wear protection occasionally -  Do you have problems with loss of  bowel control? N -  Managing your Medications? N -  Managing your Finances? N -  Housekeeping or managing your Housekeeping? N -  Some recent data might be hidden    Patient Care Team: Mar Daring, PA-C as PCP - General (Family Medicine) Birder Robson, MD as Referring Physician (Ophthalmology)    Assessment:   This is a routine wellness examination for Nabeeha.  Exercise Activities and Dietary recommendations Current Exercise Habits: The patient does not participate in regular exercise at present, Exercise limited by: orthopedic condition(s)(knee pains)  Goals    . DIET - REDUCE PORTION SIZE     Recommend cutting portion sizes in half and eating 3 [place] meals a day with 2 healthy snack a day.        Fall Risk Fall Risk  03/08/2017 03/10/2016 03/04/2015  Falls in the past year? No No No   Is the patient's home free of loose throw rugs in walkways, pet beds, electrical cords, etc? no      Grab bars in the bathroom? yes      Handrails on the stairs?   no      Adequate lighting?   yes  Timed Get Up and Go performed: N/A  Depression Screen PHQ 2/9 Scores 03/08/2017 03/10/2016 03/04/2015  PHQ - 2 Score 0 0 0     Cognitive Function     6CIT Screen 03/08/2017  What Year? 0 points  What month? 0 points  What time? 0 points  Count back from 20 0 points  Months in reverse 0 points  Repeat phrase 2 points  Total Score 2    Immunization History  Administered Date(s) Administered  . Influenza, High Dose Seasonal PF 12/03/2014  . Influenza-Unspecified 01/11/2016, 01/19/2017  . Pneumococcal Conjugate-13 03/08/2017  . Pneumococcal Polysaccharide-23 03/20/2011  . Td 03/10/2004    Qualifies for Shingles Vaccine? Pt has received one Shingrix and will receive second dose at pharmacy.   Screening Tests Health Maintenance  Topic Date Due  . Hepatitis C Screening  Feb 28, 1946  . MAMMOGRAM  02/18/1995  . TETANUS/TDAP  03/10/2014  . COLONOSCOPY  03/21/2023  .  INFLUENZA VACCINE  Completed  . DEXA SCAN  Completed  . PNA vac Low Risk Adult  Completed    Cancer Screenings: Lung: Low Dose CT Chest recommended if Age 2-80 years, 30 pack-year currently smoking OR have quit w/in 15years. Patient does not qualify. Breast:  Up to date on Mammogram? No , pt to set this up by the end of 2018 and beginning of 2019. Up to date of Bone Density/Dexa? Yes Colorectal: up to date  Additional Screenings:  Hepatitis B/HIV/Syphillis: pt declined labs  Hepatitis C Screening: pt would like this done at next OV  with PCP on 03/11/17.     Plan:  I have personally reviewed and addressed the Medicare Annual Wellness questionnaire and have noted the following in the patient's chart:  A. Medical and social history B. Use of alcohol, tobacco or illicit drugs  C. Current medications and supplements D. Functional ability and status E.  Nutritional status F.  Physical activity G. Advance directives H. List of other physicians I.  Hospitalizations, surgeries, and ER visits in previous 12 months J.  [place] such as hearing and vision if needed, cognitive and depression L. Referrals and appointments - none  In addition, I have reviewed and discussed with patient certain preventive protocols, quality metrics, and best practice recommendations. A written personalized care plan for preventive services as well as general preventive health recommendations were provided to patient.  See attached scanned questionnaire for additional information.   Signed,  Fabio Neighbors, LPN Nurse Health Advisor   Nurse Recommendations: Pt would like the Hepatitis C lab added to her additional blood work that will be ordered on 03/11/17. Pt declined the tetanus vaccine today. Pt plans to have her mammogram completed by the end of 2018 or beginning of 2019. Order still on file.

## 2017-03-11 ENCOUNTER — Ambulatory Visit (INDEPENDENT_AMBULATORY_CARE_PROVIDER_SITE_OTHER): Payer: PPO | Admitting: Physician Assistant

## 2017-03-11 ENCOUNTER — Encounter: Payer: Self-pay | Admitting: Physician Assistant

## 2017-03-11 VITALS — BP 158/100 | HR 71 | Temp 98.3°F | Resp 16 | Ht 66.0 in | Wt 278.0 lb

## 2017-03-11 DIAGNOSIS — F5101 Primary insomnia: Secondary | ICD-10-CM

## 2017-03-11 DIAGNOSIS — M170 Bilateral primary osteoarthritis of knee: Secondary | ICD-10-CM

## 2017-03-11 DIAGNOSIS — E039 Hypothyroidism, unspecified: Secondary | ICD-10-CM

## 2017-03-11 DIAGNOSIS — Z0000 Encounter for general adult medical examination without abnormal findings: Secondary | ICD-10-CM

## 2017-03-11 DIAGNOSIS — I10 Essential (primary) hypertension: Secondary | ICD-10-CM | POA: Diagnosis not present

## 2017-03-11 DIAGNOSIS — G4733 Obstructive sleep apnea (adult) (pediatric): Secondary | ICD-10-CM

## 2017-03-11 DIAGNOSIS — E782 Mixed hyperlipidemia: Secondary | ICD-10-CM | POA: Diagnosis not present

## 2017-03-11 DIAGNOSIS — Z6841 Body Mass Index (BMI) 40.0 and over, adult: Secondary | ICD-10-CM | POA: Diagnosis not present

## 2017-03-11 DIAGNOSIS — Z1159 Encounter for screening for other viral diseases: Secondary | ICD-10-CM

## 2017-03-11 DIAGNOSIS — E559 Vitamin D deficiency, unspecified: Secondary | ICD-10-CM | POA: Diagnosis not present

## 2017-03-11 MED ORDER — AMLODIPINE BESYLATE 5 MG PO TABS: 5.0000 mg | ORAL_TABLET | Freq: Every day | ORAL | 3 refills | Status: DC

## 2017-03-11 MED ORDER — TRAZODONE HCL 50 MG PO TABS: 50.0000 mg | ORAL_TABLET | Freq: Every day | ORAL | 1 refills | Status: DC

## 2017-03-11 NOTE — Patient Instructions (Signed)
Health Maintenance for Postmenopausal Women Menopause is a normal process in which your reproductive ability comes to an end. This process happens gradually over a span of months to years, usually between the ages of 22 and 9. Menopause is complete when you have missed 12 consecutive menstrual periods. It is important to talk with your health care provider about some of the most common conditions that affect postmenopausal women, such as heart disease, cancer, and bone loss (osteoporosis). Adopting a healthy lifestyle and getting preventive care can help to promote your health and wellness. Those actions can also lower your chances of developing some of these common conditions. What should I know about menopause? During menopause, you may experience a number of symptoms, such as:  Moderate-to-severe hot flashes.  Night sweats.  Decrease in sex drive.  Mood swings.  Headaches.  Tiredness.  Irritability.  Memory problems.  Insomnia.  Choosing to treat or not to treat menopausal changes is an individual decision that you make with your health care provider. What should I know about hormone replacement therapy and supplements? Hormone therapy products are effective for treating symptoms that are associated with menopause, such as hot flashes and night sweats. Hormone replacement carries certain risks, especially as you become older. If you are thinking about using estrogen or estrogen with progestin treatments, discuss the benefits and risks with your health care provider. What should I know about heart disease and stroke? Heart disease, heart attack, and stroke become more likely as you age. This may be due, in part, to the hormonal changes that your body experiences during menopause. These can affect how your body processes dietary fats, triglycerides, and cholesterol. Heart attack and stroke are both medical emergencies. There are many things that you can do to help prevent heart disease  and stroke:  Have your blood pressure checked at least every 1-2 years. High blood pressure causes heart disease and increases the risk of stroke.  If you are 53-22 years old, ask your health care provider if you should take aspirin to prevent a heart attack or a stroke.  Do not use any tobacco products, including cigarettes, chewing tobacco, or electronic cigarettes. If you need help quitting, ask your health care provider.  It is important to eat a healthy diet and maintain a healthy weight. ? Be sure to include plenty of vegetables, fruits, low-fat dairy products, and lean protein. ? Avoid eating foods that are high in solid fats, added sugars, or salt (sodium).  Get regular exercise. This is one of the most important things that you can do for your health. ? Try to exercise for at least 150 minutes each week. The type of exercise that you do should increase your heart rate and make you sweat. This is known as moderate-intensity exercise. ? Try to do strengthening exercises at least twice each week. Do these in addition to the moderate-intensity exercise.  Know your numbers.Ask your health care provider to check your cholesterol and your blood glucose. Continue to have your blood tested as directed by your health care provider.  What should I know about cancer screening? There are several types of cancer. Take the following steps to reduce your risk and to catch any cancer development as early as possible. Breast Cancer  Practice breast self-awareness. ? This means understanding how your breasts normally appear and feel. ? It also means doing regular breast self-exams. Let your health care provider know about any changes, no matter how [place].  If you are 40  or older, have a clinician do a breast exam (clinical breast exam or CBE) every year. Depending on your age, family history, and medical history, it may be recommended that you also have a yearly breast X-ray (mammogram).  If you  have a family history of breast cancer, talk with your health care provider about genetic screening.  If you are at high risk for breast cancer, talk with your health care provider about having an MRI and a mammogram every year.  Breast cancer (BRCA) gene test is recommended for women who have family members with BRCA-related cancers. Results of the assessment will determine the need for genetic counseling and BRCA1 and for BRCA2 testing. BRCA-related cancers include these types: ? Breast. This occurs in males or females. ? Ovarian. ? Tubal. This may also be called fallopian tube cancer. ? Cancer of the abdominal or pelvic lining (peritoneal cancer). ? Prostate. ? Pancreatic.  Cervical, Uterine, and Ovarian Cancer Your health care provider may recommend that you be screened regularly for cancer of the pelvic organs. These include your ovaries, uterus, and vagina. This screening involves a pelvic exam, which includes checking for microscopic changes to the surface of your cervix (Pap test).  For women ages 21-65, health care providers may recommend a pelvic exam and a Pap test every three years. For women ages 7-65, they may recommend the Pap test and pelvic exam, combined with testing for human papilloma virus (HPV), every five years. Some types of HPV increase your risk of cervical cancer. Testing for HPV may also be done on women of any age who have unclear Pap test results.  Other health care providers may not recommend any screening for nonpregnant women who are considered low risk for pelvic cancer and have no symptoms. Ask your health care provider if a screening pelvic exam is right for you.  If you have had past treatment for cervical cancer or a condition that could lead to cancer, you need Pap tests and screening for cancer for at least 20 years after your treatment. If Pap tests have been discontinued for you, your risk factors (such as having a new sexual partner) need to be  reassessed to determine if you should start having screenings again. Some women have medical problems that increase the chance of getting cervical cancer. In these cases, your health care provider may recommend that you have screening and Pap tests more often.  If you have a family history of uterine cancer or ovarian cancer, talk with your health care provider about genetic screening.  If you have vaginal bleeding after reaching menopause, tell your health care provider.  There are currently no reliable tests available to screen for ovarian cancer.  Lung Cancer Lung cancer screening is recommended for adults 8-15 years old who are at high risk for lung cancer because of a history of smoking. A yearly low-dose CT scan of the lungs is recommended if you:  Currently smoke.  Have a history of at least 30 pack-years of smoking and you currently smoke or have quit within the past 15 years. A pack-year is smoking an average of one pack of cigarettes per day for one year.  Yearly screening should:  Continue until it has been 15 years since you quit.  Stop if you develop a health problem that would prevent you from having lung cancer treatment.  Colorectal Cancer  This type of cancer can be detected and can often be prevented.  Routine colorectal cancer screening usually begins at  age 54 and continues through age 25.  If you have risk factors for colon cancer, your health care provider may recommend that you be screened at an earlier age.  If you have a family history of colorectal cancer, talk with your health care provider about genetic screening.  Your health care provider may also recommend using home test kits to check for hidden blood in your stool.  A Merta camera at the end of a tube can be used to examine your colon directly (sigmoidoscopy or colonoscopy). This is done to check for the earliest forms of colorectal cancer.  Direct examination of the colon should be repeated every  5-10 years until age 57. However, if early forms of precancerous polyps or Acklin growths are found or if you have a family history or genetic risk for colorectal cancer, you may need to be screened more often.  Skin Cancer  Check your skin from head to toe regularly.  Monitor any moles. Be sure to tell your health care provider: ? About any new moles or changes in moles, especially if there is a change in a mole's shape or color. ? If you have a mole that is larger than the size of a pencil eraser.  If any of your family members has a history of skin cancer, especially at a young age, talk with your health care provider about genetic screening.  Always use sunscreen. Apply sunscreen liberally and repeatedly throughout the day.  Whenever you are outside, protect yourself by wearing long sleeves, pants, a wide-brimmed hat, and sunglasses.  What should I know about osteoporosis? Osteoporosis is a condition in which bone destruction happens more quickly than new bone creation. After menopause, you may be at an increased risk for osteoporosis. To help prevent osteoporosis or the bone fractures that can happen because of osteoporosis, the following is recommended:  If you are 35-40 years old, get at least 1,000 mg of calcium and at least 600 mg of vitamin D per day.  If you are older than age 38 but younger than age 74, get at least 1,200 mg of calcium and at least 600 mg of vitamin D per day.  If you are older than age 25, get at least 1,200 mg of calcium and at least 800 mg of vitamin D per day.  Smoking and excessive alcohol intake increase the risk of osteoporosis. Eat foods that are rich in calcium and vitamin D, and do weight-bearing exercises several times each week as directed by your health care provider. What should I know about how menopause affects my mental health? Depression may occur at any age, but it is more common as you become older. Common symptoms of depression  include:  Low or sad mood.  Changes in sleep patterns.  Changes in appetite or eating patterns.  Feeling an overall lack of motivation or enjoyment of activities that you previously enjoyed.  Frequent crying spells.  Talk with your health care provider if you think that you are experiencing depression. What should I know about immunizations? It is important that you get and maintain your immunizations. These include:  Tetanus, diphtheria, and pertussis (Tdap) booster vaccine.  Influenza every year before the flu season begins.  Pneumonia vaccine.  Shingles vaccine.  Your health care provider may also recommend other immunizations. This information is not intended to replace advice given to you by your health care provider. Make sure you discuss any questions you have with your health care provider. Document Released: 05/01/2005  Document Revised: 09/27/2015 Document Reviewed: 12/11/2014 [employer] Interactive Patient Education  2018 [employer]  

## 2017-03-11 NOTE — Progress Notes (Signed)
Patient: Robin Espinoza, Female    DOB: April 22, 1945, 71 y.o.   MRN: [id number] Visit Date: 03/11/2017  Today's Provider: Mar Daring, PA-C   Chief Complaint  Patient presents with  . Annual Exam   Subjective:    Annual physical exam Robin Espinoza is a 71 y.o. female who presents today for health maintenance and complete physical. She feels fairly well. She reports exercising none. She reports she is sleeping well.  AWV:03/08/17 with [place] ----------------------------------------------------------------- Patient is also requesting a referral to Ortho for her knee. She reports that her knees are worsening and hurting more. She has stiffness after sitting for long periods of time. Had xrays of right knee in 2014. 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 She takes Meloxicam 15mg  daily.   She is also requesting a handicap sticker.  Review of Systems  Constitutional: Positive for activity change and fatigue.  HENT: Negative.   Eyes: Negative.   Respiratory: Positive for apnea.   Cardiovascular: Negative.   Gastrointestinal: Negative.   Endocrine: Negative.   Genitourinary: Negative.   Musculoskeletal: Positive for arthralgias, back pain, gait problem and joint swelling (occasionally knees).  Skin: Negative.   Allergic/Immunologic: Negative.   Hematological: Negative.   Psychiatric/Behavioral: Negative.     Social History      She  reports that she has quit smoking. she has never used smokeless tobacco. She reports that she drinks alcohol. She reports that she does not use drugs.       Social History   Socioeconomic History  . Marital status: Married    Spouse name: None  . Number of children: 3  . Years of education: None  . Highest education level: None  Social Needs  . Financial resource strain: Not hard at all  . Food insecurity - worry: Never true  . Food  insecurity - inability: Never true  . Transportation needs - medical: No  . Transportation needs - non-medical: No  Occupational History  . Occupation: retired  Tobacco Use  . Smoking status: Former Research scientist (life sciences)  . Smokeless tobacco: Never Used  . Tobacco comment: in college  Substance and Sexual Activity  . Alcohol use: Yes    Frequency: Never    Comment: maybe 1 drink a month  . Drug use: No  . Sexual activity: None  Other Topics Concern  . None  Social History Narrative  . None    Past Medical History:  Diagnosis Date  . Anxiety   . GERD (gastroesophageal reflux disease)   . Hyperlipidemia 12/21/2005  . Hypertension   . Insomnia   . Obstructive sleep apnea   . Osteoarthrosis   . Vitamin D deficiency      Patient Active Problem List   Diagnosis Date Noted  . Avitaminosis D 02/28/2015  . Hypothyroidism 11/13/2014  . Anxiety disorder 10/15/2014  . Insomnia 09/21/2014  . Acid reflux 11/30/2007  . Arthritis, degenerative 09/16/2007  . Fam hx-ischem heart disease 02/12/2007  . Adiposity 02/09/2007  . Obstructive apnea 05/06/2006  . 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 BP (high blood pressure) 03/23/1998    Past Surgical History:  Procedure Laterality Date  . TUBAL LIGATION  1981    Family History        Family Status  Relation Name Status  . Mother  Deceased at age 38       hypercholesterolemia  .  Father  Deceased at age 10       cause of death MI        Her family history includes Diabetes in her father.     No Known Allergies   Current Outpatient Medications:  .  [facility] D3 1000 units capsule, TAKE ONE CAPSULE BY MOUTH EVERY DAY (Patient not taking: Reported on 03/08/2017), Disp: 90 capsule, Rfl: 3 .  FLUZONE HIGH-DOSE 0.5 ML injection, TO BE ADMINISTERED BY PHARMACIST FOR IMMUNIZATION, Disp: , Rfl: 0 .  levothyroxine (SYNTHROID, LEVOTHROID) 112 MCG tablet, TAKE 1 TABLET (112 MCG TOTAL) BY MOUTH DAILY BEFORE BREAKFAST., Disp: 90  tablet, Rfl: 1 .  lovastatin (MEVACOR) 40 MG tablet, TAKE 1 TABLET BY MOUTH AT BEDTIME, Disp: 90 tablet, Rfl: 3 .  meloxicam (MOBIC) 15 MG tablet, TAKE 1 TABLET BY MOUTH EVERY DAY, Disp: 90 tablet, Rfl: 2 .  metoprolol succinate (TOPROL-XL) 100 MG 24 hr tablet, TAKE 1 TABLET BY MOUTH EVERY DAY, Disp: 90 tablet, Rfl: 1 .  NON FORMULARY, CPAP, Disp: , Rfl:  .  omeprazole (PRILOSEC) 20 MG capsule, TAKE ONE CAPSULE BY MOUTH EVERY DAY, Disp: 90 capsule, Rfl: 1 .  traZODone (DESYREL) 50 MG tablet, TAKE 1 TABLET BY MOUTH ONCE A DAY, Disp: 90 tablet, Rfl: 1 .  venlafaxine XR (EFFEXOR-XR) 75 MG 24 hr capsule, TAKE 2 CAPSULES (150 MG TOTAL) BY MOUTH DAILY WITH BREAKFAST., Disp: 60 capsule, Rfl: 5   Patient Care Team: Mar Daring, PA-C as PCP - General (Family Medicine) Birder Robson, MD as Referring Physician (Ophthalmology)      Objective:   Vitals: BP (!) 158/100 (BP Location: Left Wrist, Patient Position: Sitting, Cuff Size: Normal)   Pulse 71   Temp 98.3 F (36.8 C) (Oral)   Resp 16   Ht 5\' 6"  (1.676 m)   Wt 278 lb (126.1 kg)   BMI 44.87 kg/m    Physical Exam  Constitutional: She is oriented to person, place, and time. She appears well-developed and well-nourished. No distress.  HENT:  Head: Normocephalic and atraumatic.  Right Ear: Hearing, tympanic membrane, external ear and ear canal normal.  Left Ear: Hearing, tympanic membrane, external ear and ear canal normal.  Nose: Nose normal.  Mouth/Throat: Uvula is midline, oropharynx is clear and moist and mucous membranes are normal. No oropharyngeal exudate.  Eyes: Conjunctivae and EOM are normal. Pupils are equal, round, and reactive to light. Right eye exhibits no discharge. Left eye exhibits no discharge. No scleral icterus.  Neck: Normal range of motion. Neck supple. No JVD present. Carotid bruit is not present. No tracheal deviation present. No thyromegaly present.  Cardiovascular: Normal rate, regular rhythm, normal  heart sounds and intact distal pulses. Exam reveals no gallop and no friction rub.  No murmur heard. Pulmonary/Chest: Effort normal and breath sounds normal. No respiratory distress. She has no wheezes. She has no rales. She exhibits no tenderness.  Abdominal: Soft. Bowel sounds are normal. She exhibits no distension and no mass. There is no tenderness. There is no rebound and no guarding.  Musculoskeletal: Normal range of motion. She exhibits no edema or tenderness.  Lymphadenopathy:    She has no cervical adenopathy.  Neurological: She is alert and oriented to person, place, and time. She has normal reflexes.  Skin: Skin is warm and dry. No rash noted. She is not diaphoretic.  Psychiatric: She has a normal mood and affect. Her behavior is normal. Judgment and thought content normal.  Vitals reviewed.  Depression Screen PHQ 2/9 Scores 03/08/2017 03/10/2016 03/04/2015  PHQ - 2 Score 0 0 0      Assessment & Plan:     Routine Health Maintenance and Physical Exam  Exercise Activities and Dietary recommendations Goals    . DIET - REDUCE PORTION SIZE     Recommend cutting portion sizes in half and eating 3 Springer meals a day with 2 healthy snack a day.        Immunization History  Administered Date(s) Administered  . Influenza, High Dose Seasonal PF 12/03/2014  . Influenza-Unspecified 01/11/2016, 01/19/2017  . Pneumococcal Conjugate-13 03/08/2017  . Pneumococcal Polysaccharide-23 03/20/2011  . Td 03/10/2004    Health Maintenance  Topic Date Due  . Hepatitis C Screening  1946/01/29  . MAMMOGRAM  02/18/1995  . TETANUS/TDAP  03/10/2014  . COLONOSCOPY  03/21/2023  . INFLUENZA VACCINE  Completed  . DEXA SCAN  Completed  . PNA vac Low Risk Adult  Completed     Discussed health benefits of physical activity, and encouraged her to engage in regular exercise appropriate for her age and condition.    1. Annual physical exam Normal physical exam today. Will check labs as  below and f/u pending lab results. If labs are stable and WNL she will not need to have these rechecked for one year at her next annual physical exam. She is to call the office in the meantime if she has any acute issue, questions or concerns.  2. Primary osteoarthritis of both knees Will refer to Dr. Mack Guise as below. Suspect OA of both knees secondary to age and weight. Continue meloxicam 15mg  for now.  - Ambulatory referral to Orthopedic Surgery  3. Primary insomnia Stable. Diagnosis pulled for medication refill. Continue current medical treatment plan. - traZODone (DESYREL) 50 MG tablet; Take 1 tablet (50 mg total) by mouth daily.  Dispense: 90 tablet; Refill: 1  4. Essential hypertension Elevated today. Will add amlodipine 5mg  as below. Continue metoprolol 100mg  daily. Will check labs as below and f/u pending results. - CBC with Differential/Platelet - Comprehensive metabolic panel - Lipid panel - Hemoglobin A1c - amLODipine (NORVASC) 5 MG tablet; Take 1 tablet (5 mg total) by mouth daily.  Dispense: 90 tablet; Refill: 3  5. Hypothyroidism, unspecified type Stable on levothyroxine 165mcg. Will check labs as below and f/u pending results. - TSH  6. Combined fat and carbohydrate induced hyperlipemia Stable on lovastatin 40mg . Will check labs as below and f/u pending results. - Comprehensive metabolic panel - Lipid panel - Hemoglobin A1c  7. Avitaminosis D H/O this requiring supplementation. Not currently on supplementation. Postmenopausal.  - CBC with Differential/Platelet - Vitamin D (25 hydroxy)  8. Obstructive apnea Uses CPAP nightly with resolution of symptoms. No complaints.   9. BMI 40.0-44.9, adult [facility]) Counseled patient on healthy lifestyle modifications including dieting and exercise.  - Comprehensive metabolic panel - Lipid panel - Hemoglobin A1c  10. Need for hepatitis C screening test - Hepatitis C  antibody  --------------------------------------------------------------------    Mar Daring, PA-C  [facility]

## 2017-03-17 ENCOUNTER — Ambulatory Visit: Payer: Self-pay

## 2017-03-25 ENCOUNTER — Telehealth: Payer: Self-pay | Admitting: Physician Assistant

## 2017-03-25 NOTE — Telephone Encounter (Signed)
Pt states you had given her a handicap form she needed to take to [facility] and she has misplaced it and asking that one be left up front for her

## 2017-03-26 ENCOUNTER — Other Ambulatory Visit: Payer: Self-pay | Admitting: Physician Assistant

## 2017-03-26 DIAGNOSIS — E559 Vitamin D deficiency, unspecified: Secondary | ICD-10-CM | POA: Diagnosis not present

## 2017-03-26 DIAGNOSIS — Z6841 Body Mass Index (BMI) 40.0 and over, adult: Secondary | ICD-10-CM | POA: Diagnosis not present

## 2017-03-26 DIAGNOSIS — I10 Essential (primary) hypertension: Secondary | ICD-10-CM | POA: Diagnosis not present

## 2017-03-26 DIAGNOSIS — E039 Hypothyroidism, unspecified: Secondary | ICD-10-CM | POA: Diagnosis not present

## 2017-03-26 DIAGNOSIS — Z1159 Encounter for screening for other viral diseases: Secondary | ICD-10-CM | POA: Diagnosis not present

## 2017-03-26 NOTE — Telephone Encounter (Signed)
LM that the for is placed upfront ready for pick up.  Thanks,  -Faith Patricelli

## 2017-03-27 LAB — VITAMIN D 25 HYDROXY (VIT D DEFICIENCY, FRACTURES): Vit D, 25-Hydroxy: 24.2 ng/mL — ABNORMAL LOW (ref 30.0–100.0)

## 2017-03-27 LAB — TSH: TSH: 3.11 u[IU]/mL (ref 0.450–4.500)

## 2017-03-27 LAB — LIPID PANEL W/O CHOL/HDL RATIO
Cholesterol, Total: 171 mg/dL (ref 100–199)
HDL: 43 mg/dL (ref >39)
LDL Calculated: 89 mg/dL (ref 0–99)
TRIGLYCERIDES: 194 mg/dL — AB (ref 0–149)
VLDL CHOLESTEROL CAL: 39 mg/dL (ref 5–40)

## 2017-03-27 LAB — CBC WITH DIFFERENTIAL/PLATELET
Basophils Absolute: 0 10*3/uL (ref 0.0–0.2)
Basos: 1 %
EOS (ABSOLUTE): 0.1 10*3/uL (ref 0.0–0.4)
EOS: 2 %
HEMATOCRIT: 41.9 % (ref 34.0–46.6)
Hemoglobin: 14.3 g/dL (ref 11.1–15.9)
Immature Grans (Abs): 0 10*3/uL (ref 0.0–0.1)
Immature Granulocytes: 0 %
LYMPHS ABS: 2 10*3/uL (ref 0.7–3.1)
Lymphs: 26 %
MCH: 30.6 pg (ref 26.6–33.0)
MCHC: 34.1 g/dL (ref 31.5–35.7)
MCV: 90 fL (ref 79–97)
MONOS ABS: 0.7 10*3/uL (ref 0.1–0.9)
Monocytes: 10 %
NEUTROS ABS: 4.9 10*3/uL (ref 1.4–7.0)
Neutrophils: 61 %
Platelets: 224 10*3/uL (ref 150–379)
RBC: 4.68 x10E6/uL (ref 3.77–5.28)
RDW: 14.7 % (ref 12.3–15.4)
WBC: 7.8 10*3/uL (ref 3.4–10.8)

## 2017-03-27 LAB — COMPREHENSIVE METABOLIC PANEL
ALT: 22 IU/L (ref 0–32)
AST: 21 IU/L (ref 0–40)
Albumin/Globulin Ratio: 1.4 (ref 1.2–2.2)
Albumin: 3.9 g/dL (ref 3.5–4.8)
Alkaline Phosphatase: 117 IU/L (ref 39–117)
BUN/Creatinine Ratio: 23 (ref 12–28)
BUN: 19 mg/dL (ref 8–27)
Bilirubin Total: 0.3 mg/dL (ref 0.0–1.2)
CALCIUM: 9.1 mg/dL (ref 8.7–10.3)
CHLORIDE: 103 mmol/L (ref 96–106)
CO2: 20 mmol/L (ref 20–29)
Creatinine, Ser: 0.84 mg/dL (ref 0.57–1.00)
GFR calc non Af Amer: 70 mL/min/{1.73_m2} (ref >59)
GFR, EST AFRICAN AMERICAN: 80 mL/min/{1.73_m2} (ref >59)
Globulin, Total: 2.8 g/dL (ref 1.5–4.5)
Glucose: 108 mg/dL — ABNORMAL HIGH (ref 65–99)
Potassium: 4.8 mmol/L (ref 3.5–5.2)
Sodium: 142 mmol/L (ref 134–144)
TOTAL PROTEIN: 6.7 g/dL (ref 6.0–8.5)

## 2017-03-27 LAB — HGB A1C W/O EAG: HEMOGLOBIN A1C: 5.8 % — AB (ref 4.8–5.6)

## 2017-03-27 LAB — HEPATITIS C ANTIBODY: Hep C Virus Ab: <0.1 s/co ratio (ref 0.0–0.9)

## 2017-03-29 ENCOUNTER — Telehealth: Payer: Self-pay

## 2017-03-29 NOTE — Telephone Encounter (Signed)
-----   Message from Mar Daring, PA-C sent at 03/29/2017 10:18 AM EST ----- Cholesterol, specifically triglycerides, are much improved from labs last year. A1c is up slightly to 5.8 from 5.7 but still in prediabetic range. Limit fatty foods, carbs and sugars in diet. Vit D is still borderline low at 24. Recommend to restart OTC Vit D supplementation of 1000-2000 IU daily. All other labs are WNL and stable.

## 2017-03-29 NOTE — Telephone Encounter (Signed)
Patient advised as below. Patient verbalizes understanding and is in agreement with treatment plan.  

## 2017-04-01 DIAGNOSIS — M48061 Spinal stenosis, lumbar region without neurogenic claudication: Secondary | ICD-10-CM | POA: Diagnosis not present

## 2017-04-01 DIAGNOSIS — M170 Bilateral primary osteoarthritis of knee: Secondary | ICD-10-CM | POA: Diagnosis not present

## 2017-04-02 DIAGNOSIS — M170 Bilateral primary osteoarthritis of knee: Secondary | ICD-10-CM | POA: Diagnosis not present

## 2017-04-06 ENCOUNTER — Ambulatory Visit
Admission: RE | Admit: 2017-04-06 | Discharge: 2017-04-06 | Disposition: A | Discharge status: Home / Self Care | Payer: PPO | Source: Ambulatory Visit | Attending: Physician Assistant | Admitting: Physician Assistant

## 2017-04-06 DIAGNOSIS — Z1231 Encounter for screening mammogram for malignant neoplasm of breast: Secondary | ICD-10-CM | POA: Insufficient documentation

## 2017-04-06 DIAGNOSIS — Z1239 Encounter for other screening for malignant neoplasm of breast: Secondary | ICD-10-CM

## 2017-04-06 IMAGING — MG MM DIGITAL SCREENING BILAT W/ CAD
7 series · 7 of 7 positions shown · non-contrast
Comparison: Previous exam(s).

ACR Breast Density Category a: The breast tissue is almost entirely
fatty.

CLINICAL DATA: Screening.

EXAM:
DIGITAL SCREENING BILATERAL MAMMOGRAM WITH CAD

[L MLO (1 of 2)]
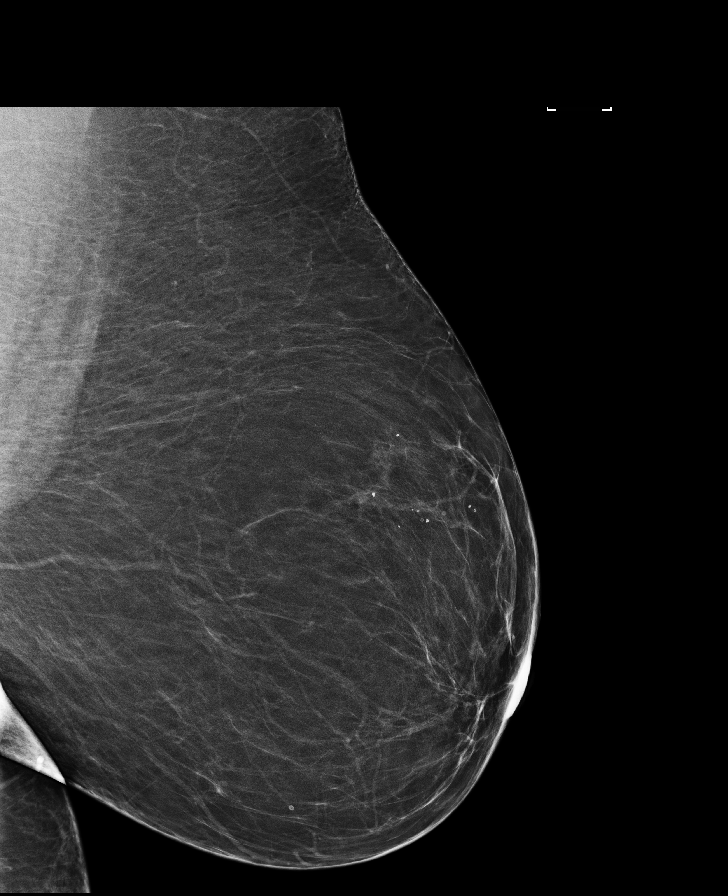

[R MLO (1 of 2)]
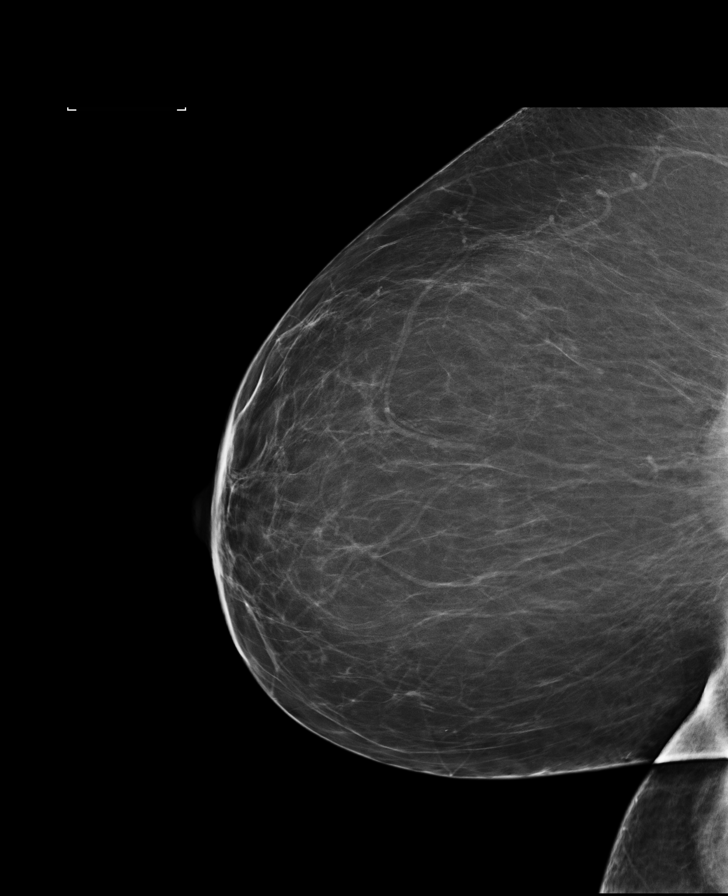

[L CC]
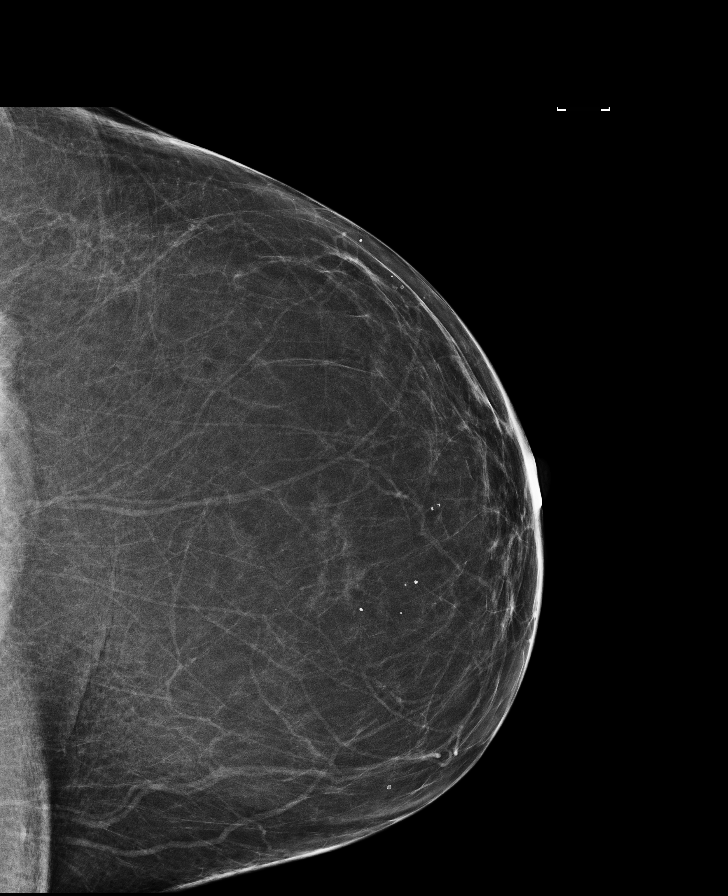

[L MLO (2 of 2)]
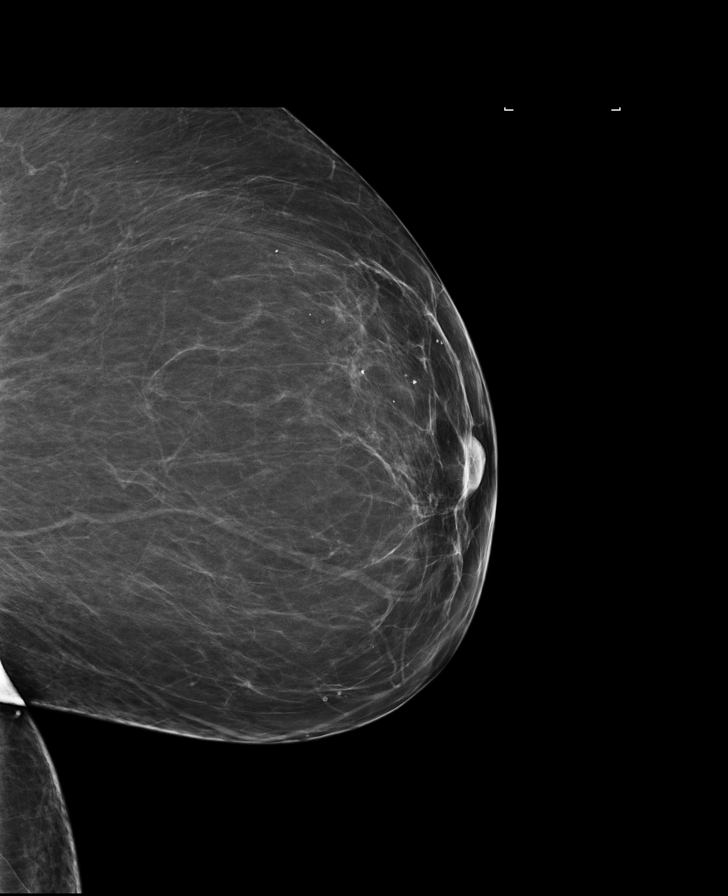

[R MLO (2 of 2)]
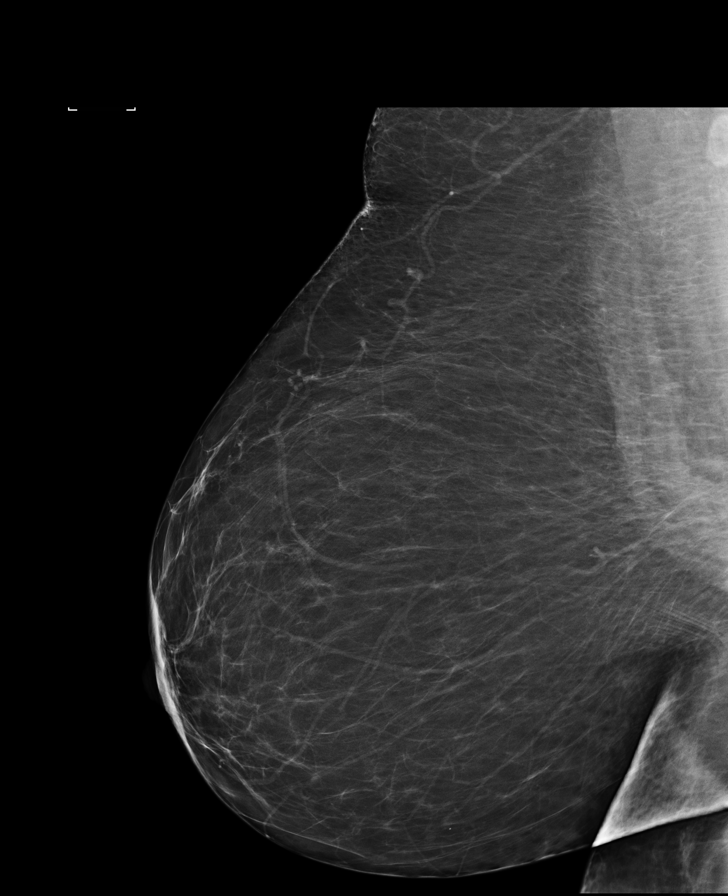

[L CV]
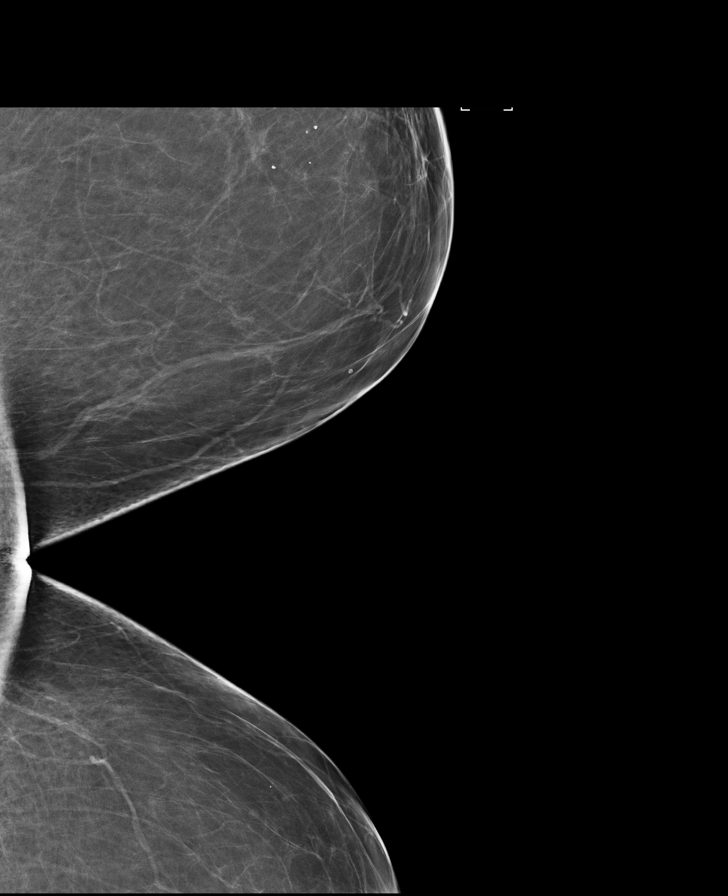

[R CC]
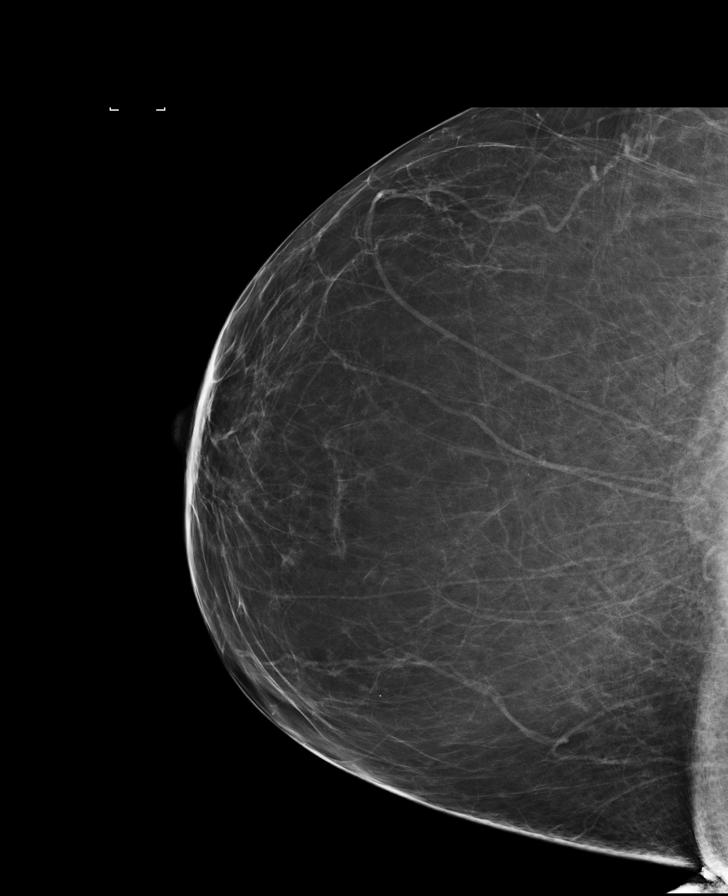

[7 of 7 positions shown; findings below may reference images not displayed]

FINDINGS: There are no findings suspicious for malignancy. Images were
processed with CAD.
IMPRESSION: No mammographic evidence of malignancy. A result letter of this
screening mammogram will be mailed directly to the patient.

RECOMMENDATION:
Screening mammogram in one year. (Code:[0V])

BI-RADS CATEGORY  1: Negative.

## 2017-04-14 ENCOUNTER — Other Ambulatory Visit: Payer: Self-pay | Admitting: Physician Assistant

## 2017-05-28 ENCOUNTER — Other Ambulatory Visit: Payer: Self-pay | Admitting: Physician Assistant

## 2017-05-28 DIAGNOSIS — I10 Essential (primary) hypertension: Secondary | ICD-10-CM

## 2017-05-28 DIAGNOSIS — E039 Hypothyroidism, unspecified: Secondary | ICD-10-CM

## 2017-05-28 DIAGNOSIS — K219 Gastro-esophageal reflux disease without esophagitis: Secondary | ICD-10-CM

## 2017-05-28 DIAGNOSIS — M1990 Unspecified osteoarthritis, unspecified site: Secondary | ICD-10-CM

## 2017-05-28 MED ORDER — METOPROLOL SUCCINATE ER 100 MG PO TB24: 100.0000 mg | ORAL_TABLET | Freq: Every day | ORAL | 3 refills | Status: DC

## 2017-05-28 MED ORDER — LEVOTHYROXINE SODIUM 112 MCG PO TABS: ORAL_TABLET | ORAL | 1 refills | Status: DC

## 2017-05-28 MED ORDER — MELOXICAM 15 MG PO TABS: 15.0000 mg | ORAL_TABLET | Freq: Every day | ORAL | 3 refills | Status: DC

## 2017-05-28 MED ORDER — OMEPRAZOLE 20 MG PO CPDR: 20.0000 mg | DELAYED_RELEASE_CAPSULE | Freq: Every day | ORAL | 3 refills | Status: DC

## 2017-05-28 NOTE — Telephone Encounter (Addendum)
[facility] for a 90-days supply for the following medications. Thanks CC  metoprolol succinate (TOPROL-XL) 100 MG 24 hr tablet   omeprazole (PRILOSEC) 20 MG capsule   meloxicam (MOBIC) 15 MG tablet

## 2017-05-28 NOTE — Telephone Encounter (Signed)
Please advise. Thanks.  

## 2017-05-28 NOTE — Telephone Encounter (Signed)
[facility] for a 90-days supply for the following medication. Thanks CC  levothyroxine (SYNTHROID, LEVOTHROID) 112 MCG tablet

## 2017-06-11 DIAGNOSIS — G4733 Obstructive sleep apnea (adult) (pediatric): Secondary | ICD-10-CM | POA: Diagnosis not present

## 2017-06-11 DIAGNOSIS — G4730 Sleep apnea, unspecified: Secondary | ICD-10-CM | POA: Diagnosis not present

## 2017-06-22 DIAGNOSIS — H26491 Other secondary cataract, right eye: Secondary | ICD-10-CM | POA: Diagnosis not present

## 2017-07-13 ENCOUNTER — Ambulatory Visit: Payer: PPO | Admitting: Physician Assistant

## 2017-07-19 ENCOUNTER — Telehealth: Payer: Self-pay | Admitting: Physician Assistant

## 2017-07-19 NOTE — Telephone Encounter (Signed)
Left message on patients personal voicemail with instructions to try mucinex dm for cough and congestion.  Tylenol prn for fever and body aches.  Also advised to try coricidin hbp if needed.  Gargle salt water for sore throat, push fluids and rest.

## 2017-07-19 NOTE — Telephone Encounter (Signed)
Cough, cong, no fever, no energy and feeling "yuck".  Pt states this has been going on for 3 days and would like to know how to treat.

## 2017-07-19 NOTE — Telephone Encounter (Signed)
Mucinex DM for cough and congestion. Tylenol prn for fever and body aches. May do coricidin HBP if needed instead of other two options. Salt water gargles for sore throat. Push fluids and rest.

## 2017-07-23 DIAGNOSIS — M170 Bilateral primary osteoarthritis of knee: Secondary | ICD-10-CM | POA: Diagnosis not present

## 2017-08-20 ENCOUNTER — Other Ambulatory Visit: Payer: Self-pay | Admitting: Physician Assistant

## 2017-08-20 DIAGNOSIS — F329 Major depressive disorder, single episode, unspecified: Secondary | ICD-10-CM

## 2017-08-20 DIAGNOSIS — F32A Depression, unspecified: Secondary | ICD-10-CM

## 2017-08-27 ENCOUNTER — Encounter: Payer: Self-pay | Admitting: Physician Assistant

## 2017-08-27 ENCOUNTER — Ambulatory Visit: Payer: PPO | Admitting: Physician Assistant

## 2017-08-27 VITALS — BP 140/80 | HR 79 | Temp 98.0°F | Resp 16

## 2017-08-27 DIAGNOSIS — H6123 Impacted cerumen, bilateral: Secondary | ICD-10-CM

## 2017-08-27 NOTE — Patient Instructions (Signed)
Earwax Buildup, Adult The ears produce a substance called earwax that helps keep bacteria out of the ear and protects the skin in the ear canal. Occasionally, earwax can build up in the ear and cause discomfort or hearing loss. What increases the risk? This condition is more likely to develop in people who:  Are female.  Are elderly.  Naturally produce more earwax.  Clean their ears often with cotton swabs.  Use earplugs often.  Use in-ear headphones often.  Wear hearing aids.  Have narrow ear canals.  Have earwax that is overly thick or sticky.  Have eczema.  Are dehydrated.  Have excess hair in the ear canal.  What are the signs or symptoms? Symptoms of this condition include:  Reduced or muffled hearing.  A feeling of fullness in the ear or feeling that the ear is plugged.  Fluid coming from the ear.  Ear pain.  Ear itch.  Ringing in the ear.  Coughing.  An obvious piece of earwax that can be seen inside the ear canal.  How is this diagnosed? This condition may be diagnosed based on:  Your symptoms.  Your medical history.  An ear exam. During the exam, your health care provider will look into your ear with an instrument called an otoscope.  You may have tests, including a hearing test. How is this treated? This condition may be treated by:  Using ear drops to soften the earwax.  Having the earwax removed by a health care provider. The health care provider may: ? Flush the ear with water. ? Use an instrument that has a loop on the end (curette). ? Use a suction device.  Surgery to remove the wax buildup. This may be done in severe cases.  Follow these instructions at home:  Take over-the-counter and prescription medicines only as told by your health care provider.  Do not put any objects, including cotton swabs, into your ear. You can clean the opening of your ear canal with a washcloth or facial tissue.  Follow instructions from your health  care provider about cleaning your ears. Do not over-clean your ears.  Drink enough fluid to keep your urine clear or pale yellow. This will help to thin the earwax.  Keep all follow-up visits as told by your health care provider. If earwax builds up in your ears often or if you use hearing aids, consider seeing your health care provider for routine, preventive ear cleanings. Ask your health care provider how often you should schedule your cleanings.  If you have hearing aids, clean them according to instructions from the manufacturer and your health care provider. Contact a health care provider if:  You have ear pain.  You develop a fever.  You have blood, pus, or other fluid coming from your ear.  You have hearing loss.  You have ringing in your ears that does not go away.  Your symptoms do not improve with treatment.  You feel like the room is spinning (vertigo). Summary  Earwax can build up in the ear and cause discomfort or hearing loss.  The most common symptoms of this condition include reduced or muffled hearing and a feeling of fullness in the ear or feeling that the ear is plugged.  This condition may be diagnosed based on your symptoms, your medical history, and an ear exam.  This condition may be treated by using ear drops to soften the earwax or by having the earwax removed by a health care provider.  Do   not put any objects, including cotton swabs, into your ear. You can clean the opening of your ear canal with a washcloth or facial tissue. This information is not intended to replace advice given to you by your health care provider. Make sure you discuss any questions you have with your health care provider. Document Released: 04/16/2004 Document Revised: 05/20/2016 Document Reviewed: 05/20/2016 Elsevier Interactive Patient Education  2018 Elsevier Inc[employer]  

## 2017-08-27 NOTE — Progress Notes (Signed)
Patient: Robin Espinoza Female    DOB: 05-01-1945   72 y.o.   MRN: [id number] Visit Date: 08/27/2017  Today's Provider: Mar Daring, PA-C   Chief Complaint  Patient presents with  . Ear Fullness   Subjective:    HPI Patient here today with c/o right ear stopped up. No pain.     No Known Allergies   Current Outpatient Medications:  .  amLODipine (NORVASC) 5 MG tablet, Take 1 tablet (5 mg total) by mouth daily., Disp: 90 tablet, Rfl: 3 .  [facility] D3 1000 units capsule, TAKE ONE CAPSULE BY MOUTH EVERY DAY, Disp: 90 capsule, Rfl: 3 .  levothyroxine (SYNTHROID, LEVOTHROID) 112 MCG tablet, TAKE 1 TABLET (112 MCG TOTAL) BY MOUTH DAILY BEFORE BREAKFAST., Disp: 90 tablet, Rfl: 1 .  lovastatin (MEVACOR) 40 MG tablet, TAKE 1 TABLET BY MOUTH AT BEDTIME, Disp: 90 tablet, Rfl: 3 .  meloxicam (MOBIC) 15 MG tablet, Take 1 tablet (15 mg total) by mouth daily., Disp: 90 tablet, Rfl: 3 .  metoprolol succinate (TOPROL-XL) 100 MG 24 hr tablet, Take 1 tablet (100 mg total) by mouth daily. Take with or immediately following a meal., Disp: 90 tablet, Rfl: 3 .  NON FORMULARY, CPAP, Disp: , Rfl:  .  omeprazole (PRILOSEC) 20 MG capsule, Take 1 capsule (20 mg total) by mouth daily., Disp: 90 capsule, Rfl: 3 .  traZODone (DESYREL) 50 MG tablet, Take 1 tablet (50 mg total) by mouth daily., Disp: 90 tablet, Rfl: 1 .  venlafaxine XR (EFFEXOR-XR) 75 MG 24 hr capsule, TAKE 2 CAPSULES (150 MG TOTAL) BY MOUTH DAILY WITH BREAKFAST., Disp: 60 capsule, Rfl: 5 .  FLUZONE HIGH-DOSE 0.5 ML injection, TO BE ADMINISTERED BY PHARMACIST FOR IMMUNIZATION, Disp: , Rfl: 0  Review of Systems  Social History   Tobacco Use  . Smoking status: Former Research scientist (life sciences)  . Smokeless tobacco: Never Used  . Tobacco comment: in college  Substance Use Topics  . Alcohol use: Yes    Frequency: Never    Comment: maybe 1 drink a month   Objective:   BP 140/80 (BP Location: Left Wrist, Patient Position: Sitting, Cuff Size: Normal)    Pulse 79   Temp 98 F (36.7 C) (Oral)   Resp 16    Physical Exam  Constitutional: She appears well-developed and well-nourished. No distress.  HENT:  Head: Normocephalic and atraumatic.  Right Ear: Hearing, tympanic membrane, external ear and ear canal normal.  Left Ear: Hearing, tympanic membrane, external ear and ear canal normal.  Nose: Nose normal.  Mouth/Throat: Uvula is midline, oropharynx is clear and moist and mucous membranes are normal. No oropharyngeal exudate.  Cerumen impaction noted bilaterally. Ear lavage performed and was successful.   Eyes: Pupils are equal, round, and reactive to light. Conjunctivae are normal. Right eye exhibits no discharge. Left eye exhibits no discharge. No scleral icterus.  Neck: Normal range of motion. Neck supple. No tracheal deviation present. No thyromegaly present.  Cardiovascular: Normal rate, regular rhythm and normal heart sounds. Exam reveals no gallop and no friction rub.  No murmur heard. Pulmonary/Chest: Effort normal and breath sounds normal. No stridor. No respiratory distress. She has no wheezes. She has no rales.  Lymphadenopathy:    She has no cervical adenopathy.  Skin: Skin is warm and dry. She is not diaphoretic.  Vitals reviewed.       Assessment & Plan:     1. Bilateral impacted cerumen Ear lavage performed bilaterally and  successful. Debrox drops in future once weekly to keep wax soft. Call if recurrence.  - Ear Lavage       Mar Daring, PA-C  [facility]

## 2017-08-30 DIAGNOSIS — L570 Actinic keratosis: Secondary | ICD-10-CM | POA: Diagnosis not present

## 2017-08-30 DIAGNOSIS — X32XXXA Exposure to sunlight, initial encounter: Secondary | ICD-10-CM | POA: Diagnosis not present

## 2017-08-30 DIAGNOSIS — D2272 Melanocytic nevi of left lower limb, including hip: Secondary | ICD-10-CM | POA: Diagnosis not present

## 2017-08-30 DIAGNOSIS — D225 Melanocytic nevi of trunk: Secondary | ICD-10-CM | POA: Diagnosis not present

## 2017-08-30 DIAGNOSIS — D2262 Melanocytic nevi of left upper limb, including shoulder: Secondary | ICD-10-CM | POA: Diagnosis not present

## 2017-08-30 DIAGNOSIS — D2261 Melanocytic nevi of right upper limb, including shoulder: Secondary | ICD-10-CM | POA: Diagnosis not present

## 2017-08-30 DIAGNOSIS — D2271 Melanocytic nevi of right lower limb, including hip: Secondary | ICD-10-CM | POA: Diagnosis not present

## 2017-09-23 ENCOUNTER — Other Ambulatory Visit: Payer: Self-pay | Admitting: Physician Assistant

## 2017-09-23 DIAGNOSIS — E559 Vitamin D deficiency, unspecified: Secondary | ICD-10-CM

## 2017-11-02 DIAGNOSIS — M170 Bilateral primary osteoarthritis of knee: Secondary | ICD-10-CM | POA: Diagnosis not present

## 2017-11-24 ENCOUNTER — Other Ambulatory Visit: Payer: Self-pay | Admitting: Physician Assistant

## 2017-11-24 DIAGNOSIS — E039 Hypothyroidism, unspecified: Secondary | ICD-10-CM

## 2018-01-05 DIAGNOSIS — M48061 Spinal stenosis, lumbar region without neurogenic claudication: Secondary | ICD-10-CM | POA: Diagnosis not present

## 2018-01-14 DIAGNOSIS — M545 Low back pain: Secondary | ICD-10-CM | POA: Diagnosis not present

## 2018-01-14 DIAGNOSIS — M25551 Pain in right hip: Secondary | ICD-10-CM | POA: Diagnosis not present

## 2018-02-12 ENCOUNTER — Other Ambulatory Visit: Payer: Self-pay | Admitting: Physician Assistant

## 2018-02-12 DIAGNOSIS — F5101 Primary insomnia: Secondary | ICD-10-CM

## 2018-02-16 ENCOUNTER — Other Ambulatory Visit: Payer: Self-pay | Admitting: Physician Assistant

## 2018-02-16 DIAGNOSIS — F32A Depression, unspecified: Secondary | ICD-10-CM

## 2018-02-16 DIAGNOSIS — F329 Major depressive disorder, single episode, unspecified: Secondary | ICD-10-CM

## 2018-02-19 ENCOUNTER — Other Ambulatory Visit: Payer: Self-pay | Admitting: Physician Assistant

## 2018-02-19 DIAGNOSIS — I10 Essential (primary) hypertension: Secondary | ICD-10-CM

## 2018-03-09 ENCOUNTER — Other Ambulatory Visit: Payer: Self-pay | Admitting: Physician Assistant

## 2018-03-09 ENCOUNTER — Encounter: Payer: PPO | Admitting: Physician Assistant

## 2018-03-09 ENCOUNTER — Ambulatory Visit: Payer: PPO

## 2018-03-09 DIAGNOSIS — E039 Hypothyroidism, unspecified: Secondary | ICD-10-CM

## 2018-03-18 ENCOUNTER — Ambulatory Visit: Payer: PPO

## 2018-03-21 ENCOUNTER — Ambulatory Visit: Payer: Self-pay

## 2018-03-28 ENCOUNTER — Ambulatory Visit (INDEPENDENT_AMBULATORY_CARE_PROVIDER_SITE_OTHER): Payer: PPO

## 2018-03-28 VITALS — BP 132/80 | HR 71 | Temp 99.6°F | Ht 66.0 in | Wt 283.6 lb

## 2018-03-28 DIAGNOSIS — Z0000 Encounter for general adult medical examination without abnormal findings: Secondary | ICD-10-CM | POA: Diagnosis not present

## 2018-03-28 NOTE — Progress Notes (Signed)
Subjective:   Robin Espinoza is a 73 y.o. female who presents for Medicare Annual (Subsequent) preventive examination.  Review of Systems:  N/A  Cardiac Risk Factors include: advanced age (>66men, >32 women);dyslipidemia;hypertension;obesity (BMI >30kg/m2)     Objective:     Vitals: BP 132/80 (BP Location: Right Arm)   Pulse 71   Temp 99.6 F (37.6 C) (Oral)   Ht 5\' 6"  (1.676 m)   Wt 283 lb 9.6 oz (128.6 kg)   BMI 45.77 kg/m   Body mass index is 45.77 kg/m.  Advanced Directives 03/28/2018 03/08/2017  Does Patient Have a Medical Advance Directive? No Yes  Would patient like information on creating a medical advance directive? Yes (MAU/Ambulatory/Procedural Areas - Information given) -    Tobacco Social History   Tobacco Use  Smoking Status Former Smoker  Smokeless Tobacco Never Used  Tobacco Comment   in college     Counseling given: Not Answered Comment: in college   Clinical Intake:  Pre-visit preparation completed: Yes  Pain : No/denies pain Pain Score: 0-No pain     Nutritional Status: BMI > 30  Obese Nutritional Risks: None Diabetes: No  How often do you need to have someone help you when you read instructions, pamphlets, or other written materials from your doctor or pharmacy?: 1 - Never  Interpreter Needed?: No  Information entered by :: [facility], LPN  Past Medical History:  Diagnosis Date  . Anxiety   . GERD (gastroesophageal reflux disease)   . Hyperlipidemia 12/21/2005  . Hypertension   . Insomnia   . Obstructive sleep apnea   . Osteoarthrosis   . Vitamin D deficiency    Past Surgical History:  Procedure Laterality Date  . Cortisone injections in back    . TUBAL LIGATION  1981   Family History  Problem Relation Age of Onset  . Diabetes Father    Social History   Socioeconomic History  . Marital status: Married    Spouse name: Not on file  . Number of children: 3  . Years of education: Not on file  . Highest education  level: Master's degree (e.g., MA, MS, MEng, MEd, MSW, MBA)  Occupational History  . Occupation: retired  Scientific laboratory technician  . Financial resource strain: Not hard at all  . Food insecurity:    Worry: Never true    Inability: Never true  . Transportation needs:    Medical: No    Non-medical: No  Tobacco Use  . Smoking status: Former Research scientist (life sciences)  . Smokeless tobacco: Never Used  . Tobacco comment: in college  Substance and Sexual Activity  . Alcohol use: Yes    Frequency: Never    Comment: rarely 1 drink  . Drug use: No  . Sexual activity: Not on file  Lifestyle  . Physical activity:    Days per week: 0 days    Minutes per session: 0 min  . Stress: To some extent  Relationships  . Social connections:    Talks on phone: More than three times a week    Gets together: More than three times a week    Attends religious service: 1 to 4 times per year    Active member of club or organization: Yes    Attends meetings of clubs or organizations: More than 4 times per year    Relationship status: Married  Other Topics Concern  . Not on file  Social History Narrative  . Not on file    Outpatient Encounter  Medications as of 03/28/2018  Medication Sig  . amLODipine (NORVASC) 5 MG tablet TAKE 1 TABLET BY MOUTH EVERY DAY  . [facility] D3 1000 units capsule TAKE ONE CAPSULE BY MOUTH EVERY DAY  . levothyroxine (SYNTHROID, LEVOTHROID) 112 MCG tablet TAKE 1 TABLET (112 MCG TOTAL) BY MOUTH DAILY BEFORE BREAKFAST.  Marland Kitchen lovastatin (MEVACOR) 40 MG tablet TAKE 1 TABLET BY MOUTH AT BEDTIME  . meloxicam (MOBIC) 15 MG tablet Take 1 tablet (15 mg total) by mouth daily.  . metoprolol succinate (TOPROL-XL) 100 MG 24 hr tablet Take 1 tablet (100 mg total) by mouth daily. Take with or immediately following a meal.  . NON FORMULARY CPAP  . omeprazole (PRILOSEC) 20 MG capsule Take 1 capsule (20 mg total) by mouth daily.  . traZODone (DESYREL) 50 MG tablet TAKE 1 TABLET BY MOUTH EVERY DAY  . venlafaxine XR (EFFEXOR-XR) 75 MG  24 hr capsule TAKE 2 CAPSULES (150 MG TOTAL) BY MOUTH DAILY WITH BREAKFAST.  Marland Kitchen FLUZONE HIGH-DOSE 0.5 ML injection TO BE ADMINISTERED BY PHARMACIST FOR IMMUNIZATION   No facility-administered encounter medications on file as of 03/28/2018.     Activities of Daily Living In your present state of health, do you have any difficulty performing the following activities: 03/28/2018  Hearing? N  Vision? N  Difficulty concentrating or making decisions? N  Walking or climbing stairs? Y  Comment Due to back pains.   Dressing or bathing? N  Doing errands, shopping? N  Preparing Food and eating ? N  Using the Toilet? N  In the past six months, have you accidently leaked urine? Y  Comment Often with urges, does not wear protection unless going out.   Do you have problems with loss of bowel control? N  Managing your Medications? N  Managing your Finances? N  Housekeeping or managing your Housekeeping? N  Some recent data might be hidden    Patient Care Team: Mar Daring, PA-C as PCP - General (Family Medicine) Birder Robson, MD as Referring Physician (Ophthalmology) Alcus Dad as Physician Assistant    Assessment:   This is a routine wellness examination for [name].  Exercise Activities and Dietary recommendations Current Exercise Habits: The patient does not participate in regular exercise at present, Exercise limited by: orthopedic condition(s)  Goals    . DIET - REDUCE PORTION SIZE     Recommend cutting portion sizes in half and eating 3 Madani meals a day with 2 healthy snack a day.        Fall Risk Fall Risk  03/28/2018 03/08/2017 03/10/2016 03/04/2015  Falls in the past year? 0 No No No  Number falls in past yr: 0 - - -  Injury with Fall? 0 - - -   FALL RISK PREVENTION PERTAINING TO THE HOME:  Any stairs in or around the home WITH handrails? No  Home free of loose throw rugs in walkways, pet beds, electrical cords, etc? Yes  Adequate lighting in your home  to reduce risk of falls? Yes   ASSISTIVE DEVICES UTILIZED TO PREVENT FALLS:  Life alert? No  Use of a cane, walker or w/c? No  Grab bars in the bathroom? Yes  Shower chair or bench in shower? No  Elevated toilet seat or a handicapped toilet? Yes    TIMED UP AND GO:  Was the test performed? No .    Depression Screen PHQ 2/9 Scores 03/28/2018 03/28/2018 03/08/2017 03/10/2016  PHQ - 2 Score 1 1 0 0  PHQ- 9  Score 7 - - -     Cognitive Function     6CIT Screen 03/28/2018 03/08/2017  What Year? 0 points 0 points  What month? 0 points 0 points  What time? 0 points 0 points  Count back from 20 0 points 0 points  Months in reverse 0 points 0 points  Repeat phrase 0 points 2 points  Total Score 0 2    Immunization History  Administered Date(s) Administered  . Influenza, High Dose Seasonal PF 12/03/2014  . Influenza-Unspecified 01/11/2016, 01/19/2017  . Pneumococcal Conjugate-13 03/08/2017  . Pneumococcal Polysaccharide-23 03/20/2011  . Td 03/10/2004    Qualifies for Shingles Vaccine? Yes . Due for Shingrix. Education has been provided regarding the importance of this vaccine. Pt has been advised to call insurance company to determine out of pocket expense. Advised may also receive vaccine at local pharmacy or Health Dept. Verbalized acceptance and understanding.  Tdap: Although this vaccine is not a covered service during a Wellness Exam, does the patient still wish to receive this vaccine today?  No .  Education has been provided regarding the importance of this vaccine. Advised may receive this vaccine at local pharmacy or Health Dept. Aware to provide a copy of the vaccination record if obtained from local pharmacy or Health Dept. Verbalized acceptance and understanding.  Flu Vaccine: Up to date  Pneumococcal Vaccine: Up to date   Screening Tests Health Maintenance  Topic Date Due  . TETANUS/TDAP  03/10/2014  . DEXA SCAN  08/01/2017  . MAMMOGRAM  04/07/2019  .  COLONOSCOPY  03/21/2023  . INFLUENZA VACCINE  Completed  . Hepatitis C Screening  Completed  . PNA vac Low Risk Adult  Completed    Cancer Screenings:  Colorectal Screening: Completed 03/20/13. Repeat every 10 years.  Mammogram: Completed 04/06/17.   Bone Density: Completed 08/01/12. Results reflect OSTEOPENIA. Repeat every 5 years. Ordered today. Pt aware the office will call re: appt.  Lung Cancer Screening: (Low Dose CT Chest recommended if Age 97-80 years, 30 pack-year currently smoking OR have quit w/in 15years.) does not qualify.    Additional Screening:  Hepatitis C Screening: Up to date  Vision Screening: Recommended annual ophthalmology exams for early detection of glaucoma and other disorders of the eye.  Dental Screening: Recommended annual dental exams for proper oral hygiene  Community Resource Referral:  CRR required this visit?  No      Plan:  I have personally reviewed and addressed the Medicare Annual Wellness questionnaire and have noted the following in the patient's chart:  A. Medical and social history B. Use of alcohol, tobacco or illicit drugs  C. Current medications and supplements D. Functional ability and status E.  Nutritional status F.  Physical activity G. Advance directives H. List of other physicians I.  Hospitalizations, surgeries, and ER visits in previous 12 months J.  [place] such as hearing and vision if needed, cognitive and depression L. Referrals and appointments - none  In addition, I have reviewed and discussed with patient certain preventive protocols, quality metrics, and best practice recommendations. A written personalized care plan for preventive services as well as general preventive health recommendations were provided to patient.  See attached scanned questionnaire for additional information.   Signed,  Fabio Neighbors, LPN Nurse Health Advisor   Nurse Recommendations: Pt declined the tetanus vaccine  today.

## 2018-03-28 NOTE — Patient Instructions (Addendum)
[name] , Thank you for taking time to come for your Medicare Wellness Visit. I appreciate your ongoing commitment to your health goals. Please review the following plan we discussed and let me know if I can assist you in the future.   Screening recommendations/referrals: Colonoscopy: Up to date, due 02/2023 Mammogram: Up to date, due 03/2019 Bone Density: Ordered today. Pt aware that office will contact her to set up apt. Recommended yearly ophthalmology/optometry visit for glaucoma screening and checkup Recommended yearly dental visit for hygiene and checkup  Vaccinations: Influenza vaccine: Up to date Pneumococcal vaccine: Completed series Tdap vaccine: Pt declines today.  Shingles vaccine: Pt declines today.     Advanced directives: Advance directive discussed with you today. I have provided a copy for you to complete at home and have notarized. Once this is complete please bring a copy in to our office so we can scan it into your chart.  Conditions/risks identified: Obesity- recommend to continue cutting portion size in half and eating 3 [place] meals a day with two healthy snacks in between.   Next appointment: 04/15/18 @ 2:00 PM with [name].    Preventive Care 85 Years and Older, Female Preventive care refers to lifestyle choices and visits with your health care provider that can promote health and wellness. What does preventive care include?  A yearly physical exam. This is also called an annual well check.  Dental exams once or twice a year.  Routine eye exams. Ask your health care provider how often you should have your eyes checked.  Personal lifestyle choices, including:  Daily care of your teeth and gums.  Regular physical activity.  Eating a healthy diet.  Avoiding tobacco and drug use.  Limiting alcohol use.  Practicing safe sex.  Taking low-dose aspirin every day.  Taking vitamin and mineral supplements as recommended by your health care  provider. What happens during an annual well check? The services and screenings done by your health care provider during your annual well check will depend on your age, overall health, lifestyle risk factors, and family history of disease. Counseling  Your health care provider may ask you questions about your:  Alcohol use.  Tobacco use.  Drug use.  Emotional well-being.  Home and relationship well-being.  Sexual activity.  Eating habits.  History of falls.  Memory and ability to understand (cognition).  Work and work Statistician.  Reproductive health. Screening  You may have the following tests or measurements:  Height, weight, and BMI.  Blood pressure.  Lipid and cholesterol levels. These may be checked every 5 years, or more frequently if you are over 32 years old.  Skin check.  Lung cancer screening. You may have this screening every year starting at age 39 if you have a 30-pack-year history of smoking and currently smoke or have quit within the past 15 years.  Fecal occult blood test (FOBT) of the stool. You may have this test every year starting at age 44.  Flexible sigmoidoscopy or colonoscopy. You may have a sigmoidoscopy every 5 years or a colonoscopy every 10 years starting at age 73.  Hepatitis C blood test.  Hepatitis B blood test.  Sexually transmitted disease (STD) testing.  Diabetes screening. This is done by checking your blood sugar (glucose) after you have not eaten for a while (fasting). You may have this done every 1-3 years.  Bone density scan. This is done to screen for osteoporosis. You may have this done starting at age 15.  Mammogram. This may be done every 1-2 years. Talk to your health care provider about how often you should have regular mammograms. Talk with your health care provider about your test results, treatment options, and if necessary, the need for more tests. Vaccines  Your health care provider may recommend certain  vaccines, such as:  Influenza vaccine. This is recommended every year.  Tetanus, diphtheria, and acellular pertussis (Tdap, Td) vaccine. You may need a Td booster every 10 years.  Zoster vaccine. You may need this after age 59.  Pneumococcal 13-valent conjugate (PCV13) vaccine. One dose is recommended after age 22.  Pneumococcal polysaccharide (PPSV23) vaccine. One dose is recommended after age 69. Talk to your health care provider about which screenings and vaccines you need and how often you need them. This information is not intended to replace advice given to you by your health care provider. Make sure you discuss any questions you have with your health care provider. Document Released: 04/05/2015 Document Revised: 11/27/2015 Document Reviewed: 01/08/2015 Elsevier Interactive Patient Education  2017 [place] Prevention in the Home Falls can cause injuries. They can happen to people of all ages. There are many things you can do to make your home safe and to help prevent falls. What can I do on the outside of my home?  Regularly fix the edges of walkways and driveways and fix any cracks.  Remove anything that might make you trip as you walk through a door, such as a raised step or threshold.  Trim any bushes or trees on the path to your home.  Use bright outdoor lighting.  Clear any walking paths of anything that might make someone trip, such as rocks or tools.  Regularly check to see if handrails are loose or broken. Make sure that both sides of any steps have handrails.  Any raised decks and porches should have guardrails on the edges.  Have any leaves, snow, or ice cleared regularly.  Use sand or salt on walking paths during winter.  Clean up any spills in your garage right away. This includes oil or grease spills. What can I do in the bathroom?  Use night lights.  Install grab bars by the toilet and in the tub and shower. Do not use towel bars as grab  bars.  Use non-skid mats or decals in the tub or shower.  If you need to sit down in the shower, use a plastic, non-slip stool.  Keep the floor dry. Clean up any water that spills on the floor as soon as it happens.  Remove soap buildup in the tub or shower regularly.  Attach bath mats securely with double-sided non-slip rug tape.  Do not have throw rugs and other things on the floor that can make you trip. What can I do in the bedroom?  Use night lights.  Make sure that you have a light by your bed that is easy to reach.  Do not use any sheets or blankets that are too big for your bed. They should not hang down onto the floor.  Have a firm chair that has side arms. You can use this for support while you get dressed.  Do not have throw rugs and other things on the floor that can make you trip. What can I do in the kitchen?  Clean up any spills right away.  Avoid walking on wet floors.  Keep items that you use a lot in easy-to-reach places.  If you need to reach  something above you, use a strong step stool that has a grab bar.  Keep electrical cords out of the way.  Do not use floor polish or wax that makes floors slippery. If you must use wax, use non-skid floor wax.  Do not have throw rugs and other things on the floor that can make you trip. What can I do with my stairs?  Do not leave any items on the stairs.  Make sure that there are handrails on both sides of the stairs and use them. Fix handrails that are broken or loose. Make sure that handrails are as long as the stairways.  Check any carpeting to make sure that it is firmly attached to the stairs. Fix any carpet that is loose or worn.  Avoid having throw rugs at the top or bottom of the stairs. If you do have throw rugs, attach them to the floor with carpet tape.  Make sure that you have a light switch at the top of the stairs and the bottom of the stairs. If you do not have them, ask someone to add them for  you. What else can I do to help prevent falls?  Wear shoes that:  Do not have high heels.  Have rubber bottoms.  Are comfortable and fit you well.  Are closed at the toe. Do not wear sandals.  If you use a stepladder:  Make sure that it is fully opened. Do not climb a closed stepladder.  Make sure that both sides of the stepladder are locked into place.  Ask someone to hold it for you, if possible.  Clearly mark and make sure that you can see:  Any grab bars or handrails.  First and last steps.  Where the edge of each step is.  Use tools that help you move around (mobility aids) if they are needed. These include:  Canes.  Walkers.  Scooters.  Crutches.  Turn on the lights when you go into a dark area. Replace any light bulbs as soon as they burn out.  Set up your furniture so you have a clear path. Avoid moving your furniture around.  If any of your floors are uneven, fix them.  If there are any pets around you, be aware of where they are.  Review your medicines with your doctor. Some medicines can make you feel dizzy. This can increase your chance of falling. Ask your doctor what other things that you can do to help prevent falls. This information is not intended to replace advice given to you by your health care provider. Make sure you discuss any questions you have with your health care provider. Document Released: 01/03/2009 Document Revised: 08/15/2015 Document Reviewed: 04/13/2014 Elsevier Interactive Patient Education  2017 [employer].

## 2018-04-13 NOTE — Progress Notes (Signed)
Patient: SHAHLA BETSILL, Female    DOB: 04-13-1945, 73 y.o.   MRN: [id number] Visit Date: 04/15/2018  Today's Provider: Mar Daring, PA-C   Chief Complaint  Patient presents with  . Annual Exam   Subjective:     Complete Physical MIAMARIE MOLL is a 73 y.o. female. She feels poorly. She reports exercising none. She reports she is sleeping well.  She reports arthralgias all over. Recently diagnosed with spinal stenosis at [facility]. 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  She also complains of worsening urge incontinence. Uses panty liners as needed. Wants to discuss treatment options.  -----------------------------------------------------------   Review of Systems  Constitutional: Negative.   HENT: Negative.   Eyes: Negative.   Respiratory: Positive for apnea.   Cardiovascular: Negative.   Gastrointestinal: Negative.   Endocrine: Positive for heat intolerance.  Genitourinary: Positive for enuresis and urgency. Negative for dysuria and frequency.  Musculoskeletal: Positive for arthralgias, back pain, myalgias and neck pain.  Skin: Negative.   Allergic/Immunologic: Negative.   Neurological: Negative.   Hematological: Negative.   Psychiatric/Behavioral: Negative.     Social History   Socioeconomic History  . Marital status: Married    Spouse name: Not on file  . Number of children: 3  . Years of education: Not on file  . Highest education level: Master's degree (e.g., MA, MS, MEng, MEd, MSW, MBA)  Occupational History  . Occupation: retired  Scientific laboratory technician  . Financial resource strain: Not hard at all  . Food insecurity:    Worry: Never true    Inability: Never true  . Transportation needs:    Medical: No    Non-medical: No  Tobacco Use  . Smoking status: Former Research scientist (life sciences)  . Smokeless tobacco: Never Used  . Tobacco comment: in college  Substance and Sexual Activity  . Alcohol use: Yes    Frequency: Never    Comment:  rarely 1 drink  . Drug use: No  . Sexual activity: Not on file  Lifestyle  . Physical activity:    Days per week: 0 days    Minutes per session: 0 min  . Stress: To some extent  Relationships  . Social connections:    Talks on phone: More than three times a week    Gets together: More than three times a week    Attends religious service: 1 to 4 times per year    Active member of club or organization: Yes    Attends meetings of clubs or organizations: More than 4 times per year    Relationship status: Married  . Intimate partner violence:    Fear of current or ex partner: No    Emotionally abused: No    Physically abused: No    Forced sexual activity: No  Other Topics Concern  . Not on file  Social History Narrative  . Not on file    Past Medical History:  Diagnosis Date  . Anxiety   . GERD (gastroesophageal reflux disease)   . Hyperlipidemia 12/21/2005  . Hypertension   . Insomnia   . Obstructive sleep apnea   . Osteoarthrosis   . Vitamin D deficiency      Patient Active Problem List   Diagnosis Date Noted  . Avitaminosis D 02/28/2015  . Hypothyroidism 11/13/2014  . Anxiety disorder 10/15/2014  . Insomnia 09/21/2014  . Acid reflux 11/30/2007  . Arthritis, degenerative 09/16/2007  . Fam hx-ischem heart disease 02/12/2007  .  Adiposity 02/09/2007  . Obstructive apnea 05/06/2006  . 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 BP (high blood pressure) 03/23/1998    Past Surgical History:  Procedure Laterality Date  . Cortisone injections in back    . TUBAL LIGATION  1981    Her family history includes Diabetes in her father.      Current Outpatient Medications:  .  amLODipine (NORVASC) 5 MG tablet, TAKE 1 TABLET BY MOUTH EVERY DAY, Disp: 90 tablet, Rfl: 2 .  [employer] D3 1000 units capsule, TAKE ONE CAPSULE BY MOUTH EVERY DAY, Disp: 90 capsule, Rfl: 3 .  levothyroxine (SYNTHROID, LEVOTHROID) 112 MCG tablet, TAKE 1 TABLET (112 MCG TOTAL) BY  MOUTH DAILY BEFORE BREAKFAST., Disp: 90 tablet, Rfl: 1 .  lovastatin (MEVACOR) 40 MG tablet, TAKE 1 TABLET BY MOUTH AT BEDTIME, Disp: 90 tablet, Rfl: 3 .  meloxicam (MOBIC) 15 MG tablet, Take 1 tablet (15 mg total) by mouth daily., Disp: 90 tablet, Rfl: 3 .  metoprolol succinate (TOPROL-XL) 100 MG 24 hr tablet, Take 1 tablet (100 mg total) by mouth daily. Take with or immediately following a meal., Disp: 90 tablet, Rfl: 3 .  NON FORMULARY, CPAP, Disp: , Rfl:  .  omeprazole (PRILOSEC) 20 MG capsule, Take 1 capsule (20 mg total) by mouth daily., Disp: 90 capsule, Rfl: 3 .  traZODone (DESYREL) 50 MG tablet, TAKE 1 TABLET BY MOUTH EVERY DAY, Disp: 90 tablet, Rfl: 1 .  venlafaxine XR (EFFEXOR-XR) 75 MG 24 hr capsule, TAKE 2 CAPSULES (150 MG TOTAL) BY MOUTH DAILY WITH BREAKFAST., Disp: 180 capsule, Rfl: 0 .  FLUZONE HIGH-DOSE 0.5 ML injection, TO BE ADMINISTERED BY PHARMACIST FOR IMMUNIZATION, Disp: , Rfl: 0  Patient Care Team: Mar Daring, PA-C as PCP - General (Family Medicine) Birder Robson, MD as Referring Physician (Ophthalmology) Genice Rouge, PA-C as Physician Assistant     Objective:   Vitals: BP (!) 143/89 (BP Location: Left Leg, Cuff Size: Large)   Pulse 68   Resp 16   SpO2 96%   Physical Exam Vitals signs reviewed.  Constitutional:      General: She is not in acute distress.    Appearance: She is well-developed. She is not diaphoretic.  HENT:     Head: Normocephalic and atraumatic.     Right Ear: Ear canal and external ear normal. There is impacted cerumen.     Left Ear: Ear canal and external ear normal. There is impacted cerumen.     Nose: Nose normal.     Mouth/Throat:     Mouth: Mucous membranes are moist.     Pharynx: No oropharyngeal exudate.  Eyes:     General: No scleral icterus.       Right eye: No discharge.        Left eye: No discharge.     Conjunctiva/sclera: Conjunctivae normal.     Pupils: Pupils are equal, round, and reactive to light.    Neck:     Musculoskeletal: Normal range of motion and neck supple.     Thyroid: No thyromegaly.     Vascular: No carotid bruit or JVD.     Trachea: No tracheal deviation.  Cardiovascular:     Rate and Rhythm: Normal rate and regular rhythm.     Heart sounds: Normal heart sounds. No murmur. No friction rub. No gallop.   Pulmonary:     Effort: Pulmonary effort is normal. No respiratory distress.     Breath sounds: Normal breath sounds.  No wheezing or rales.  Chest:     Chest wall: No tenderness.  Abdominal:     General: Bowel sounds are normal. There is no distension.     Palpations: Abdomen is soft. There is no mass.     Tenderness: There is no abdominal tenderness. There is no guarding or rebound.  Musculoskeletal: Normal range of motion.        General: No tenderness.  Lymphadenopathy:     Cervical: No cervical adenopathy.  Skin:    General: Skin is warm and dry.     Findings: No rash.  Neurological:     Mental Status: She is alert and oriented to person, place, and time.  Psychiatric:        Behavior: Behavior normal.        Thought Content: Thought content normal.        Judgment: Judgment normal.     Activities of Daily Living In your present state of health, do you have any difficulty performing the following activities: 03/28/2018  Hearing? N  Vision? N  Difficulty concentrating or making decisions? N  Walking or climbing stairs? Y  Comment Due to back pains.   Dressing or bathing? N  Doing errands, shopping? N  Preparing Food and eating ? N  Using the Toilet? N  In the past six months, have you accidently leaked urine? Y  Comment Often with urges, does not wear protection unless going out.   Do you have problems with loss of bowel control? N  Managing your Medications? N  Managing your Finances? N  Housekeeping or managing your Housekeeping? N  Some recent data might be hidden    Fall Risk Assessment Fall Risk  03/28/2018 03/08/2017 03/10/2016 03/04/2015   Falls in the past year? 0 No No No  Number falls in past yr: 0 - - -  Injury with Fall? 0 - - -     Depression Screen PHQ 2/9 Scores 03/28/2018 03/28/2018 03/08/2017 03/10/2016  PHQ - 2 Score 1 1 0 0  PHQ- 9 Score 7 - - -    6CIT Screen 03/28/2018  What Year? 0 points  What month? 0 points  What time? 0 points  Count back from 20 0 points  Months in reverse 0 points  Repeat phrase 0 points  Total Score 0     Assessment & Plan:    Annual Physical Reviewed patient's Family Medical History Reviewed and updated list of patient's medical providers Assessment of cognitive impairment was done Assessed patient's functional ability Established a written schedule for health screening [place] Completed and Reviewed  Exercise Activities and Dietary recommendations Goals    . DIET - REDUCE PORTION SIZE     Recommend cutting portion sizes in half and eating 3 Schone meals a day with 2 healthy snack a day.        Immunization History  Administered Date(s) Administered  . Influenza, High Dose Seasonal PF 12/03/2014  . Influenza-Unspecified 01/11/2016, 01/19/2017  . Pneumococcal Conjugate-13 03/08/2017  . Pneumococcal Polysaccharide-23 03/20/2011  . Td 03/10/2004    Health Maintenance  Topic Date Due  . TETANUS/TDAP  03/10/2014  . DEXA SCAN  08/01/2017  . MAMMOGRAM  04/07/2019  . COLONOSCOPY  03/21/2023  . INFLUENZA VACCINE  Completed  . Hepatitis C Screening  Completed  . PNA vac Low Risk Adult  Completed     Discussed health benefits of physical activity, and encouraged her to engage in regular exercise appropriate for  her age and condition.    1. Annual physical exam Normal physical exam today. Will check labs as below and f/u pending lab results. If labs are stable and WNL she will not need to have these rechecked for one year at her next annual physical exam. She is to call the office in the meantime if she has any acute issue, questions or  concerns.  2. Breast cancer screening There is no family history of breast cancer. She does perform regular self breast exams. Mammogram was ordered as below. Information for [facility] Breast clinic was given to patient so she may schedule her mammogram at her convenience. - MM 3D SCREEN BREAST BILATERAL; Future  3. BMI 40.0-44.9, adult [facility]) Patient refused weight today. Counseled patient on healthy lifestyle modifications including dieting and exercise.  - CBC w/Diff/Platelet - Comprehensive Metabolic Panel (CMET) - Lipid Profile - HgB A1c  4. Primary osteoarthritis, unspecified site Requesting to change meloxicam. Will try etodolac as below. Call if worsening symptoms or not as well controlled. [facility] is following her as well.  - etodolac (LODINE) 500 MG tablet; Take 1 tablet (500 mg total) by mouth 2 (two) times daily.  Dispense: 180 tablet; Refill: 1  5. Essential hypertension Stable. Continue amlodipien 5mg  and metoprolol 100mg  daily. Will check labs as below and f/u pending results. - CBC w/Diff/Platelet - Comprehensive Metabolic Panel (CMET) - Lipid Profile - HgB A1c  6. Hypothyroidism due to acquired atrophy of thyroid Stable on levothyroxine 153mcg. Will check labs as below and f/u pending results. - CBC w/Diff/Platelet - TSH  7. Combined fat and carbohydrate induced hyperlipemia Stable on lovastatin 40mg . Will check labs as below and f/u pending results. - CBC w/Diff/Platelet - Comprehensive Metabolic Panel (CMET) - Lipid Profile - HgB A1c  8. Avitaminosis D H/O this and taking Vit D3 OTC 1000 IU daily, also postmenopausal. Will check labs as below and f/u pending results. - CBC w/Diff/Platelet - Comprehensive Metabolic Panel (CMET) - Vitamin D (25 hydroxy)  9. Urge incontinence Worsening. Discussed medications, pelvic floor therapy, pessary, and surgical options. Patient declines all. Discussed timed toileting with going to the bathroom every 2 hours and  decreasing fluid intake a couple of hours before bed time. Call if worsening or if treatment desired.   ------------------------------------------------------------------------------------------------------------    Mar Daring, PA-C  [facility]

## 2018-04-15 ENCOUNTER — Encounter: Payer: Self-pay | Admitting: Physician Assistant

## 2018-04-15 ENCOUNTER — Ambulatory Visit (INDEPENDENT_AMBULATORY_CARE_PROVIDER_SITE_OTHER): Payer: PPO | Admitting: Physician Assistant

## 2018-04-15 VITALS — BP 143/89 | HR 68 | Resp 16

## 2018-04-15 DIAGNOSIS — N3941 Urge incontinence: Secondary | ICD-10-CM

## 2018-04-15 DIAGNOSIS — E034 Atrophy of thyroid (acquired): Secondary | ICD-10-CM

## 2018-04-15 DIAGNOSIS — E782 Mixed hyperlipidemia: Secondary | ICD-10-CM | POA: Diagnosis not present

## 2018-04-15 DIAGNOSIS — M1991 Primary osteoarthritis, unspecified site: Secondary | ICD-10-CM | POA: Diagnosis not present

## 2018-04-15 DIAGNOSIS — I10 Essential (primary) hypertension: Secondary | ICD-10-CM

## 2018-04-15 DIAGNOSIS — Z0000 Encounter for general adult medical examination without abnormal findings: Secondary | ICD-10-CM

## 2018-04-15 DIAGNOSIS — E559 Vitamin D deficiency, unspecified: Secondary | ICD-10-CM | POA: Diagnosis not present

## 2018-04-15 DIAGNOSIS — Z6841 Body Mass Index (BMI) 40.0 and over, adult: Secondary | ICD-10-CM | POA: Insufficient documentation

## 2018-04-15 DIAGNOSIS — Z1239 Encounter for other screening for malignant neoplasm of breast: Secondary | ICD-10-CM

## 2018-04-15 MED ORDER — ETODOLAC 500 MG PO TABS: 500.0000 mg | ORAL_TABLET | Freq: Two times a day (BID) | ORAL | 1 refills | Status: DC

## 2018-04-15 NOTE — Patient Instructions (Signed)
Health Maintenance After Age 74 After age 74, you are at a higher risk for certain long-term diseases and infections as well as injuries from falls. Falls are a major cause of broken bones and head injuries in people who are older than age 74. Getting regular preventive care can help to keep you healthy and well. Preventive care includes getting regular testing and making lifestyle changes as recommended by your health care provider. Talk with your health care provider about:  Which screenings and tests you should have. A screening is a test that checks for a disease when you have no symptoms.  A diet and exercise plan that is right for you. What should I know about screenings and tests to prevent falls? Screening and testing are the best ways to find a health problem early. Early diagnosis and treatment give you the best chance of managing medical conditions that are common after age 74. Certain conditions and lifestyle choices may make you more likely to have a fall. Your health care provider may recommend:  Regular vision checks. Poor vision and conditions such as cataracts can make you more likely to have a fall. If you wear glasses, make sure to get your prescription updated if your vision changes.  Medicine review. Work with your health care provider to regularly review all of the medicines you are taking, including over-the-counter medicines. Ask your health care provider about any side effects that may make you more likely to have a fall. Tell your health care provider if any medicines that you take make you feel dizzy or sleepy.  Osteoporosis screening. Osteoporosis is a condition that causes the bones to get weaker. This can make the bones weak and cause them to break more easily.  Blood pressure screening. Blood pressure changes and medicines to control blood pressure can make you feel dizzy.  Strength and balance checks. Your health care provider may recommend certain tests to check your  strength and balance while standing, walking, or changing positions.  Foot health exam. Foot pain and numbness, as well as not wearing proper footwear, can make you more likely to have a fall.  Depression screening. You may be more likely to have a fall if you have a fear of falling, feel emotionally low, or feel unable to do activities that you used to do.  Alcohol use screening. Using too much alcohol can affect your balance and may make you more likely to have a fall. What actions can I take to lower my risk of falls? General instructions  Talk with your health care provider about your risks for falling. Tell your health care provider if: ? You fall. Be sure to tell your health care provider about all falls, even ones that seem minor. ? You feel dizzy, sleepy, or off-balance.  Take over-the-counter and prescription medicines only as told by your health care provider. These include any supplements.  Eat a healthy diet and maintain a healthy weight. A healthy diet includes low-fat dairy products, low-fat (lean) meats, and fiber from whole grains, beans, and lots of fruits and vegetables. Home safety  Remove any tripping hazards, such as rugs, cords, and clutter.  Install safety equipment such as grab bars in bathrooms and safety rails on stairs.  Keep rooms and walkways well-lit. Activity   Follow a regular exercise program to stay fit. This will help you maintain your balance. Ask your health care provider what types of exercise are appropriate for you.  If you need a cane or   walker, use it as recommended by your health care provider.  Wear supportive shoes that have nonskid soles. Lifestyle  Do not drink alcohol if your health care provider tells you not to drink.  If you drink alcohol, limit how much you have: ? 0-1 drink a day for women. ? 0-2 drinks a day for men.  Be aware of how much alcohol is in your drink. In the [place] one drink equals one typical bottle of beer (12  oz), one-half glass of wine (5 oz), or one shot of hard liquor (1 oz).  Do not use any products that contain nicotine or tobacco, such as cigarettes and e-cigarettes. If you need help quitting, ask your health care provider. Summary  Having a healthy lifestyle and getting preventive care can help to protect your health and wellness after age 74.  Screening and testing are the best way to find a health problem early and help you avoid having a fall. Early diagnosis and treatment give you the best chance for managing medical conditions that are more common for people who are older than age 74.  Falls are a major cause of broken bones and head injuries in people who are older than age 74. Take precautions to prevent a fall at home.  Work with your health care provider to learn what changes you can make to improve your health and wellness and to prevent falls. This information is not intended to replace advice given to you by your health care provider. Make sure you discuss any questions you have with your health care provider. Document Released: 01/20/2017 Document Revised: 01/20/2017 Document Reviewed: 01/20/2017 Elsevier Interactive Patient Education  2019 Elsevier Inc.  

## 2018-04-19 DIAGNOSIS — E559 Vitamin D deficiency, unspecified: Secondary | ICD-10-CM | POA: Diagnosis not present

## 2018-04-19 DIAGNOSIS — I10 Essential (primary) hypertension: Secondary | ICD-10-CM | POA: Diagnosis not present

## 2018-04-19 DIAGNOSIS — E782 Mixed hyperlipidemia: Secondary | ICD-10-CM | POA: Diagnosis not present

## 2018-04-19 DIAGNOSIS — E034 Atrophy of thyroid (acquired): Secondary | ICD-10-CM | POA: Diagnosis not present

## 2018-04-19 DIAGNOSIS — Z6841 Body Mass Index (BMI) 40.0 and over, adult: Secondary | ICD-10-CM | POA: Diagnosis not present

## 2018-04-20 ENCOUNTER — Telehealth: Payer: Self-pay

## 2018-04-20 LAB — VITAMIN D 25 HYDROXY (VIT D DEFICIENCY, FRACTURES): Vit D, 25-Hydroxy: 20.3 ng/mL — ABNORMAL LOW (ref 30.0–100.0)

## 2018-04-20 LAB — CBC WITH DIFFERENTIAL/PLATELET
Basophils Absolute: 0 10*3/uL (ref 0.0–0.2)
Basos: 1 %
EOS (ABSOLUTE): 0.1 10*3/uL (ref 0.0–0.4)
Eos: 1 %
HEMATOCRIT: 43.1 % (ref 34.0–46.6)
Hemoglobin: 14.3 g/dL (ref 11.1–15.9)
Immature Grans (Abs): 0 10*3/uL (ref 0.0–0.1)
Immature Granulocytes: 1 %
Lymphocytes Absolute: 1.6 10*3/uL (ref 0.7–3.1)
Lymphs: 21 %
MCH: 31.5 pg (ref 26.6–33.0)
MCHC: 33.2 g/dL (ref 31.5–35.7)
MCV: 95 fL (ref 79–97)
Monocytes Absolute: 0.8 10*3/uL (ref 0.1–0.9)
Monocytes: 10 %
NEUTROS ABS: 5.1 10*3/uL (ref 1.4–7.0)
Neutrophils: 66 %
Platelets: 203 10*3/uL (ref 150–450)
RBC: 4.54 x10E6/uL (ref 3.77–5.28)
RDW: 12.9 % (ref 11.7–15.4)
WBC: 7.6 10*3/uL (ref 3.4–10.8)

## 2018-04-20 LAB — LIPID PANEL
Chol/HDL Ratio: 3.9 ratio (ref 0.0–4.4)
Cholesterol, Total: 170 mg/dL (ref 100–199)
HDL: 44 mg/dL (ref >39)
LDL Calculated: 97 mg/dL (ref 0–99)
Triglycerides: 146 mg/dL (ref 0–149)
VLDL Cholesterol Cal: 29 mg/dL (ref 5–40)

## 2018-04-20 LAB — COMPREHENSIVE METABOLIC PANEL
ALT: 15 IU/L (ref 0–32)
AST: 14 IU/L (ref 0–40)
Albumin/Globulin Ratio: 1.4 (ref 1.2–2.2)
Albumin: 3.9 g/dL (ref 3.7–4.7)
Alkaline Phosphatase: 116 IU/L (ref 39–117)
BUN / CREAT RATIO: 20 (ref 12–28)
BUN: 16 mg/dL (ref 8–27)
Bilirubin Total: 0.3 mg/dL (ref 0.0–1.2)
CO2: 21 mmol/L (ref 20–29)
Calcium: 9.1 mg/dL (ref 8.7–10.3)
Chloride: 104 mmol/L (ref 96–106)
Creatinine, Ser: 0.8 mg/dL (ref 0.57–1.00)
GFR calc Af Amer: 85 mL/min/{1.73_m2} (ref >59)
GFR calc non Af Amer: 73 mL/min/{1.73_m2} (ref >59)
Globulin, Total: 2.7 g/dL (ref 1.5–4.5)
Glucose: 96 mg/dL (ref 65–99)
Potassium: 4.4 mmol/L (ref 3.5–5.2)
Sodium: 140 mmol/L (ref 134–144)
Total Protein: 6.6 g/dL (ref 6.0–8.5)

## 2018-04-20 LAB — HEMOGLOBIN A1C
Est. average glucose Bld gHb Est-mCnc: 117 mg/dL
Hgb A1c MFr Bld: 5.7 % — ABNORMAL HIGH (ref 4.8–5.6)

## 2018-04-20 LAB — TSH: TSH: 2.17 u[IU]/mL (ref 0.450–4.500)

## 2018-04-20 MED ORDER — VITAMIN D (ERGOCALCIFEROL) 1.25 MG (50000 UNIT) PO CAPS: 50000.0000 [IU] | ORAL_CAPSULE | ORAL | 0 refills | Status: DC

## 2018-04-20 NOTE — Addendum Note (Signed)
Addended by: Mar Daring on: 04/20/2018 08:05 AM   Modules accepted: Orders

## 2018-04-20 NOTE — Telephone Encounter (Signed)
Viewed by Delaney Meigs on 04/20/2018 8:51 AM

## 2018-04-20 NOTE — Telephone Encounter (Signed)
-----   Message from Mar Daring, [place] sent at 04/20/2018  8:04 AM EST ----- Blood count is normal. Kidney and liver function normal. Cholesterol normal. Vit D is low. Will send in high dose Vit D to take once weekly. Once completed transition to OTC vit D 1000-2000 IU daily. Thyroid is normal. A1c is down to 5.7, was 5.8. Continue healthy lifestyle modifications.

## 2018-04-27 ENCOUNTER — Other Ambulatory Visit: Payer: Self-pay | Admitting: Physician Assistant

## 2018-05-04 DIAGNOSIS — M170 Bilateral primary osteoarthritis of knee: Secondary | ICD-10-CM | POA: Diagnosis not present

## 2018-05-18 ENCOUNTER — Other Ambulatory Visit: Payer: Self-pay | Admitting: Physician Assistant

## 2018-05-18 DIAGNOSIS — F32A Depression, unspecified: Secondary | ICD-10-CM

## 2018-05-18 DIAGNOSIS — F329 Major depressive disorder, single episode, unspecified: Secondary | ICD-10-CM

## 2018-05-18 DIAGNOSIS — I10 Essential (primary) hypertension: Secondary | ICD-10-CM

## 2018-05-25 ENCOUNTER — Other Ambulatory Visit: Payer: Self-pay | Admitting: Physician Assistant

## 2018-05-25 DIAGNOSIS — M1990 Unspecified osteoarthritis, unspecified site: Secondary | ICD-10-CM

## 2018-06-17 ENCOUNTER — Other Ambulatory Visit: Payer: Self-pay | Admitting: Physician Assistant

## 2018-06-17 DIAGNOSIS — K219 Gastro-esophageal reflux disease without esophagitis: Secondary | ICD-10-CM

## 2018-07-10 ENCOUNTER — Other Ambulatory Visit: Payer: Self-pay | Admitting: Physician Assistant

## 2018-07-10 DIAGNOSIS — E559 Vitamin D deficiency, unspecified: Secondary | ICD-10-CM

## 2018-08-16 ENCOUNTER — Other Ambulatory Visit: Payer: Self-pay | Admitting: Physician Assistant

## 2018-08-16 DIAGNOSIS — F5101 Primary insomnia: Secondary | ICD-10-CM

## 2018-08-16 NOTE — Telephone Encounter (Signed)
Please review

## 2018-08-24 DIAGNOSIS — M545 Low back pain: Secondary | ICD-10-CM | POA: Diagnosis not present

## 2018-08-24 DIAGNOSIS — M170 Bilateral primary osteoarthritis of knee: Secondary | ICD-10-CM | POA: Diagnosis not present

## 2018-08-25 DIAGNOSIS — H35373 Puckering of macula, bilateral: Secondary | ICD-10-CM | POA: Diagnosis not present

## 2018-08-25 DIAGNOSIS — H26492 Other secondary cataract, left eye: Secondary | ICD-10-CM | POA: Diagnosis not present

## 2018-08-26 DIAGNOSIS — M545 Low back pain: Secondary | ICD-10-CM | POA: Diagnosis not present

## 2018-08-26 DIAGNOSIS — M25562 Pain in left knee: Secondary | ICD-10-CM | POA: Diagnosis not present

## 2018-08-26 DIAGNOSIS — M25561 Pain in right knee: Secondary | ICD-10-CM | POA: Diagnosis not present

## 2018-08-31 DIAGNOSIS — D2262 Melanocytic nevi of left upper limb, including shoulder: Secondary | ICD-10-CM | POA: Diagnosis not present

## 2018-08-31 DIAGNOSIS — D2261 Melanocytic nevi of right upper limb, including shoulder: Secondary | ICD-10-CM | POA: Diagnosis not present

## 2018-08-31 DIAGNOSIS — D2272 Melanocytic nevi of left lower limb, including hip: Secondary | ICD-10-CM | POA: Diagnosis not present

## 2018-08-31 DIAGNOSIS — L821 Other seborrheic keratosis: Secondary | ICD-10-CM | POA: Diagnosis not present

## 2018-08-31 DIAGNOSIS — L304 Erythema intertrigo: Secondary | ICD-10-CM | POA: Diagnosis not present

## 2018-08-31 DIAGNOSIS — D225 Melanocytic nevi of trunk: Secondary | ICD-10-CM | POA: Diagnosis not present

## 2018-08-31 DIAGNOSIS — D2271 Melanocytic nevi of right lower limb, including hip: Secondary | ICD-10-CM | POA: Diagnosis not present

## 2018-09-01 ENCOUNTER — Telehealth: Payer: Self-pay

## 2018-09-01 DIAGNOSIS — M25561 Pain in right knee: Secondary | ICD-10-CM | POA: Diagnosis not present

## 2018-09-01 DIAGNOSIS — M25562 Pain in left knee: Secondary | ICD-10-CM | POA: Diagnosis not present

## 2018-09-01 DIAGNOSIS — M545 Low back pain: Secondary | ICD-10-CM | POA: Diagnosis not present

## 2018-09-01 NOTE — Telephone Encounter (Signed)
Patient called requesting an order for CPAP supplies. She says her old supplies are worn out and she needs them replaced.  She needs an order for full face mask, tube and reservoir faxed to [facility]. Fax # : [phone]) [phone]. She states Huey Romans has tried reaching out to our office, but hasn't heard back.  Phone number to [place]: [phone]. Please advise.

## 2018-09-01 NOTE — Telephone Encounter (Signed)
Please advise 

## 2018-09-02 NOTE — Telephone Encounter (Signed)
Written Rx given to Josie to fax 

## 2018-09-05 NOTE — Telephone Encounter (Signed)
Faxed order to [facility] today. Faxed to [phone]

## 2018-09-06 DIAGNOSIS — G4733 Obstructive sleep apnea (adult) (pediatric): Secondary | ICD-10-CM | POA: Diagnosis not present

## 2018-09-08 DIAGNOSIS — M25562 Pain in left knee: Secondary | ICD-10-CM | POA: Diagnosis not present

## 2018-09-08 DIAGNOSIS — M545 Low back pain: Secondary | ICD-10-CM | POA: Diagnosis not present

## 2018-09-08 DIAGNOSIS — M25561 Pain in right knee: Secondary | ICD-10-CM | POA: Diagnosis not present

## 2018-09-15 DIAGNOSIS — M25562 Pain in left knee: Secondary | ICD-10-CM | POA: Diagnosis not present

## 2018-09-15 DIAGNOSIS — M25561 Pain in right knee: Secondary | ICD-10-CM | POA: Diagnosis not present

## 2018-09-15 DIAGNOSIS — M545 Low back pain: Secondary | ICD-10-CM | POA: Diagnosis not present

## 2018-09-20 DIAGNOSIS — M545 Low back pain: Secondary | ICD-10-CM | POA: Diagnosis not present

## 2018-09-20 DIAGNOSIS — M25561 Pain in right knee: Secondary | ICD-10-CM | POA: Diagnosis not present

## 2018-09-20 DIAGNOSIS — M25562 Pain in left knee: Secondary | ICD-10-CM | POA: Diagnosis not present

## 2018-09-27 DIAGNOSIS — M25561 Pain in right knee: Secondary | ICD-10-CM | POA: Diagnosis not present

## 2018-09-27 DIAGNOSIS — M545 Low back pain: Secondary | ICD-10-CM | POA: Diagnosis not present

## 2018-09-27 DIAGNOSIS — M25562 Pain in left knee: Secondary | ICD-10-CM | POA: Diagnosis not present

## 2018-09-30 ENCOUNTER — Other Ambulatory Visit: Payer: Self-pay | Admitting: Physician Assistant

## 2018-09-30 DIAGNOSIS — E559 Vitamin D deficiency, unspecified: Secondary | ICD-10-CM

## 2018-10-06 ENCOUNTER — Ambulatory Visit: Payer: PPO | Admitting: Physician Assistant

## 2018-10-06 ENCOUNTER — Ambulatory Visit
Admission: RE | Admit: 2018-10-06 | Discharge: 2018-10-06 | Disposition: A | Discharge status: Home / Self Care | Payer: PPO | Source: Ambulatory Visit | Attending: Physician Assistant | Admitting: Physician Assistant

## 2018-10-06 ENCOUNTER — Encounter: Payer: Self-pay | Admitting: Physician Assistant

## 2018-10-06 ENCOUNTER — Other Ambulatory Visit: Payer: Self-pay

## 2018-10-06 ENCOUNTER — Ambulatory Visit
Admission: RE | Admit: 2018-10-06 | Discharge: 2018-10-06 | Disposition: A | Discharge status: Home / Self Care | Payer: PPO | Source: Ambulatory Visit | Attending: Oncology | Admitting: Oncology

## 2018-10-06 ENCOUNTER — Ambulatory Visit (INDEPENDENT_AMBULATORY_CARE_PROVIDER_SITE_OTHER): Payer: PPO | Admitting: Physician Assistant

## 2018-10-06 VITALS — BP 152/93 | HR 63 | Temp 98.4°F | Resp 16

## 2018-10-06 DIAGNOSIS — R238 Other skin changes: Secondary | ICD-10-CM

## 2018-10-06 DIAGNOSIS — M25572 Pain in left ankle and joints of left foot: Secondary | ICD-10-CM

## 2018-10-06 DIAGNOSIS — M7989 Other specified soft tissue disorders: Secondary | ICD-10-CM | POA: Diagnosis not present

## 2018-10-06 DIAGNOSIS — M25473 Effusion, unspecified ankle: Secondary | ICD-10-CM | POA: Diagnosis not present

## 2018-10-06 IMAGING — CR LEFT ANKLE COMPLETE - 3+ VIEW
1 series · 3 of 3 positions shown · non-contrast
Comparison: None.

CLINICAL DATA: Pain and anterior swelling, 1 week duration.
Question gout.

EXAM:
LEFT ANKLE COMPLETE - 3+ VIEW

[Series 1: dg ankle complete left · 0.14mm/px · 3 of 3 slices shown]
[im 1/3]
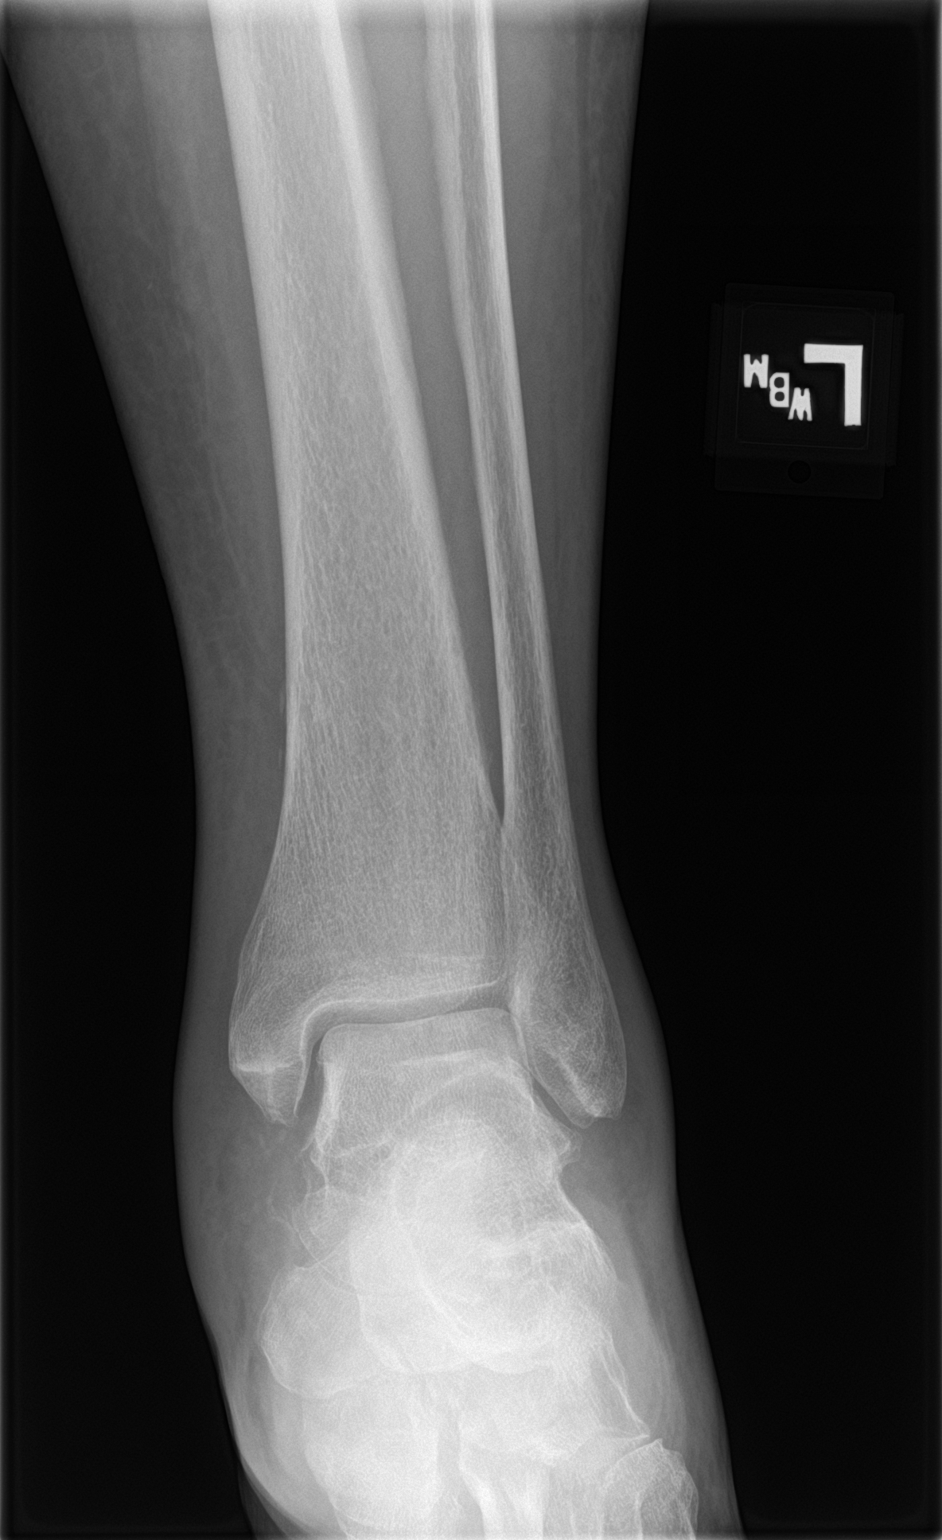
[im 2/3]
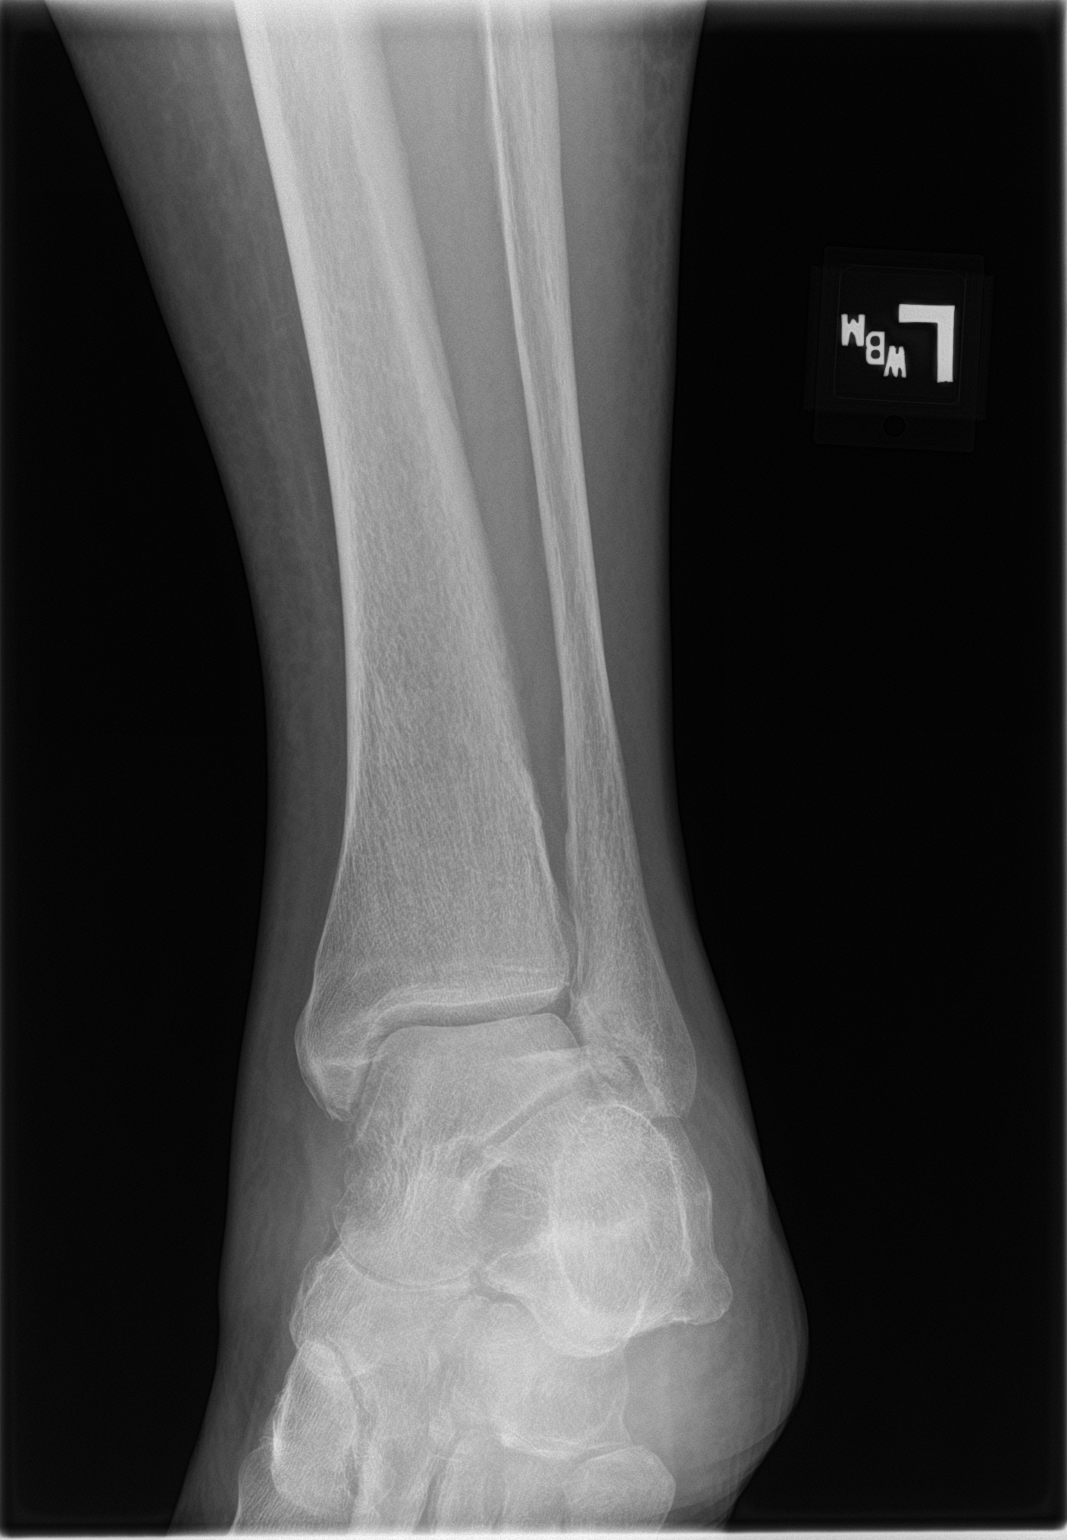
[im 3/3]
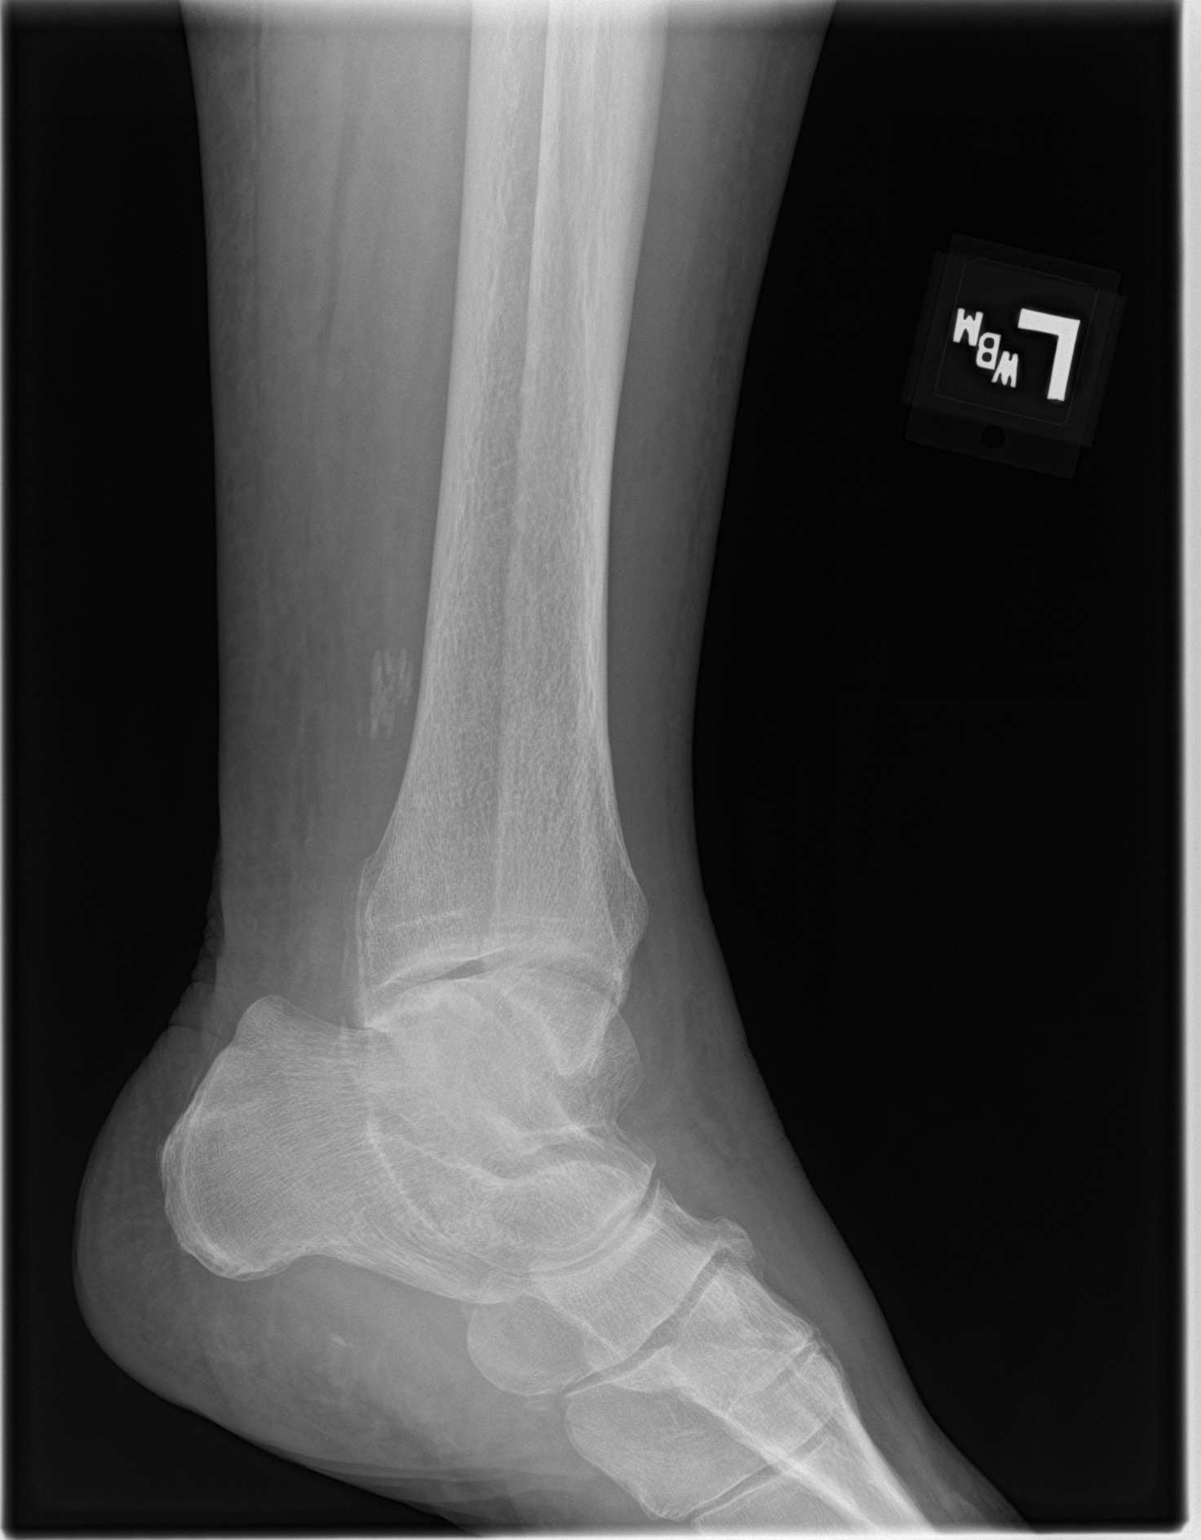

[3 of 3 positions shown; findings below may reference images not displayed]

FINDINGS: Soft tissue swelling particularly medial by radiography. No evidence
of fracture or dislocation. No sign of bone erosion to specifically
suggest gout. Soft tissue involvement could be present. Soft tissue
calcification posterior to the distal tibial diaphysis of uncertain
nature. This could be chronic muscle or tendon calcification but
does not look like ordinary vascular calcification.
IMPRESSION: No specific sign of gout. Soft tissue swelling, particularly medial.

## 2018-10-06 MED ORDER — METHYLPREDNISOLONE 4 MG PO TBPK: ORAL_TABLET | ORAL | 0 refills | Status: DC

## 2018-10-06 NOTE — Progress Notes (Signed)
Patient: Robin Espinoza Female    DOB: [birth year]   73 y.o.   MRN: [id number] Visit Date: 10/06/2018  Today's Provider: Mar Daring, PA-C   No chief complaint on file.  Subjective:    I,Joseline E. Rosas,RMA am acting as a Education administrator for [employer], PA-C.  HPI Patient here today with c/o pain, left foot/ankle. This started last Thursday but worsening daily. Reports no injury. She does have a history of gout many years ago. Does report tenderness and sensitivity to touch. No fevers or drainage.   No Known Allergies   Current Outpatient Medications:  .  amLODipine (NORVASC) 5 MG tablet, TAKE 1 TABLET BY MOUTH EVERY DAY, Disp: 90 tablet, Rfl: 2 .  etodolac (LODINE) 500 MG tablet, Take 1 tablet (500 mg total) by mouth 2 (two) times daily., Disp: 180 tablet, Rfl: 1 .  levothyroxine (SYNTHROID, LEVOTHROID) 112 MCG tablet, TAKE 1 TABLET (112 MCG TOTAL) BY MOUTH DAILY BEFORE BREAKFAST., Disp: 90 tablet, Rfl: 1 .  lovastatin (MEVACOR) 40 MG tablet, TAKE 1 TABLET BY MOUTH AT BEDTIME, Disp: 90 tablet, Rfl: 3 .  metoprolol succinate (TOPROL-XL) 100 MG 24 hr tablet, TAKE 1 TABLET (100 MG TOTAL) BY MOUTH DAILY. TAKE WITH OR IMMEDIATELY FOLLOWING A MEAL., Disp: 90 tablet, Rfl: 3 .  NON FORMULARY, CPAP, Disp: , Rfl:  .  omeprazole (PRILOSEC) 20 MG capsule, TAKE 1 CAPSULE BY MOUTH EVERY DAY, Disp: 90 capsule, Rfl: 3 .  traZODone (DESYREL) 50 MG tablet, TAKE 1 TABLET BY MOUTH EVERY DAY, Disp: 90 tablet, Rfl: 1 .  venlafaxine XR (EFFEXOR-XR) 75 MG 24 hr capsule, TAKE 2 CAPSULES (150 MG TOTAL) BY MOUTH DAILY WITH BREAKFAST., Disp: 180 capsule, Rfl: 1 .  Vitamin D, Ergocalciferol, (DRISDOL) 1.25 MG (50000 UT) CAPS capsule, TAKE 1 CAPSULE (50,000 UNITS TOTAL) BY MOUTH EVERY 7 (SEVEN) DAYS., Disp: 12 capsule, Rfl: 1 .  CVS D3 1000 units capsule, TAKE ONE CAPSULE BY MOUTH EVERY DAY (Patient not taking: Reported on 10/06/2018), Disp: 90 capsule, Rfl: 3 .  FLUZONE HIGH-DOSE 0.5 ML injection,  TO BE ADMINISTERED BY PHARMACIST FOR IMMUNIZATION, Disp: , Rfl: 0  Review of Systems  Constitutional: Negative for chills, fatigue and fever.  HENT: Negative.   Respiratory: Negative.   Cardiovascular: Negative.   Gastrointestinal: Negative.   Musculoskeletal: Positive for arthralgias and joint swelling.  Skin: Positive for color change.  Neurological: Negative for weakness and numbness.    Social History   Tobacco Use  . Smoking status: Former Research scientist (life sciences)  . Smokeless tobacco: Never Used  . Tobacco comment: in college  Substance Use Topics  . Alcohol use: Yes    Frequency: Never    Comment: rarely 1 drink      Objective:   BP (!) 152/93 (BP Location: Left Arm, Patient Position: Sitting, Cuff Size: Large)   Pulse 63   Temp 98.4 F (36.9 C) (Oral)   Resp 16  Vitals:   10/06/18 1133  BP: (!) 152/93  Pulse: 63  Resp: 16  Temp: 98.4 F (36.9 C)  TempSrc: Oral     Physical Exam Vitals signs reviewed.  Constitutional:      General: She is not in acute distress.    Appearance: Normal appearance. She is well-developed. She is obese. She is not ill-appearing or diaphoretic.  HENT:     Head: Normocephalic and atraumatic.  Neck:     Musculoskeletal: Normal range of motion and neck supple.  Thyroid: No thyromegaly.     Vascular: No JVD.     Trachea: No tracheal deviation.  Cardiovascular:     Rate and Rhythm: Normal rate and regular rhythm.     Pulses: Normal pulses.     Heart sounds: Normal heart sounds. No murmur. No friction rub. No gallop.   Pulmonary:     Effort: Pulmonary effort is normal. No respiratory distress.     Breath sounds: Normal breath sounds. No wheezing or rales.  Musculoskeletal:     Left ankle: She exhibits decreased range of motion and swelling. She exhibits normal pulse. Tenderness. Medial malleolus tenderness found. Achilles tendon normal.     Right lower leg: Edema (trace pitting edema) present.     Left lower leg: Edema (trace pitting  edema) present.       Feet:  Lymphadenopathy:     Cervical: No cervical adenopathy.  Neurological:     Mental Status: She is alert.      No results found for any visits on 10/06/18.     Assessment & Plan    1. Acute left ankle pain DDx: gout, septic arthritis, arthritic flare. Will check labs as below and f/u pending results. Also will get imaging. Medrol dose pak sent for acute symptoms. RICE conservative management advised as well. Call if worsening. - CBC w/Diff/Platelet - Basic Metabolic Panel (BMET) - Uric acid - DG Ankle Complete Left; Future - methylPREDNISolone (MEDROL) 4 MG TBPK tablet; 6 day taper; take as directed on package instructions  Dispense: 21 tablet; Refill: 0  2. Redness and swelling of ankle See above medical treatment plan. - CBC w/Diff/Platelet - Basic Metabolic Panel (BMET) - Uric acid - DG Ankle Complete Left; Future - methylPREDNISolone (MEDROL) 4 MG TBPK tablet; 6 day taper; take as directed on package instructions  Dispense: 21 tablet; Refill: 0    Mar Daring, PA-C  [facility]

## 2018-10-06 NOTE — Patient Instructions (Signed)
Gout  Gout is a condition that causes painful swelling of the joints. Gout is a type of inflammation of the joints (arthritis). This condition is caused by having too much uric acid in the body. Uric acid is a chemical that forms when the body breaks down substances called purines. Purines are important for building body proteins. When the body has too much uric acid, sharp crystals can form and build up inside the joints. This causes pain and swelling. Gout attacks can happen quickly and may be very painful (acute gout). Over time, the attacks can affect more joints and become more frequent (chronic gout). Gout can also cause uric acid to build up under the skin and inside the kidneys. What are the causes? This condition is caused by too much uric acid in your blood. This can happen because:  Your kidneys do not remove enough uric acid from your blood. This is the most common cause.  Your body makes too much uric acid. This can happen with some cancers and cancer treatments. It can also occur if your body is breaking down too many red blood cells (hemolytic anemia).  You eat too many foods that are high in purines. These foods include organ meats and some seafood. Alcohol, especially beer, is also high in purines. A gout attack may be triggered by trauma or stress. What increases the risk? You are more likely to develop this condition if you:  Have a family history of gout.  Are female and middle-aged.  Are female and have gone through menopause.  Are obese.  Frequently drink alcohol, especially beer.  Are dehydrated.  Lose weight too quickly.  Have an organ transplant.  Have lead poisoning.  Take certain medicines, including aspirin, cyclosporine, diuretics, levodopa, and niacin.  Have kidney disease.  Have a skin condition called psoriasis. What are the signs or symptoms? An attack of acute gout happens quickly. It usually occurs in just one joint. The most common place is  the big toe. Attacks often start at night. Other joints that may be affected include joints of the feet, ankle, knee, fingers, wrist, or elbow. Symptoms of this condition may include:  Severe pain.  Warmth.  Swelling.  Stiffness.  Tenderness. The affected joint may be very painful to touch.  Shiny, red, or purple skin.  Chills and fever. Chronic gout may cause symptoms more frequently. More joints may be involved. You may also have white or yellow lumps (tophi) on your hands or feet or in other areas near your joints. How is this diagnosed? This condition is diagnosed based on your symptoms, medical history, and physical exam. You may have tests, such as:  Blood tests to measure uric acid levels.  Removal of joint fluid with a thin needle (aspiration) to look for uric acid crystals.  X-rays to look for joint damage. How is this treated? Treatment for this condition has two phases: treating an acute attack and preventing future attacks. Acute gout treatment may include medicines to reduce pain and swelling, including:  NSAIDs.  Steroids. These are strong anti-inflammatory medicines that can be taken by mouth (orally) or injected into a joint.  Colchicine. This medicine relieves pain and swelling when it is taken soon after an attack. It can be given by mouth or through an IV. Preventive treatment may include:  Daily use of smaller doses of NSAIDs or colchicine.  Use of a medicine that reduces uric acid levels in your blood.  Changes to your diet. You may   need to see a dietitian about what to eat and drink to prevent gout. Follow these instructions at home: During a gout attack   If directed, put ice on the affected area: ? Put ice in a plastic bag. ? Place a towel between your skin and the bag. ? Leave the ice on for 20 minutes, 2-3 times a day.  Raise (elevate) the affected joint above the level of your heart as often as possible.  Rest the joint as much as possible.  If the affected joint is in your leg, you may be given crutches to use.  Follow instructions from your health care provider about eating or drinking restrictions. Avoiding future gout attacks  Follow a low-purine diet as told by your dietitian or health care provider. Avoid foods and drinks that are high in purines, including liver, kidney, anchovies, asparagus, herring, mushrooms, mussels, and beer.  Maintain a healthy weight or lose weight if you are overweight. If you want to lose weight, talk with your health care provider. It is important that you do not lose weight too quickly.  Start or maintain an exercise program as told by your health care provider. Eating and drinking  Drink enough fluids to keep your urine pale yellow.  If you drink alcohol: ? Limit how much you use to:  0-1 drink a day for women.  0-2 drinks a day for men. ? Be aware of how much alcohol is in your drink. In the [place] one drink equals one 12 oz bottle of beer (355 mL) one 5 oz glass of wine (148 mL), or one 1 oz glass of hard liquor (44 mL). General instructions  Take over-the-counter and prescription medicines only as told by your health care provider.  Do not drive or use heavy machinery while taking prescription pain medicine.  Return to your normal activities as told by your health care provider. Ask your health care provider what activities are safe for you.  Keep all follow-up visits as told by your health care provider. This is important. Contact a health care provider if you have:  Another gout attack.  Continuing symptoms of a gout attack after 10 days of treatment.  Side effects from your medicines.  Chills or a fever.  Burning pain when you urinate.  Pain in your lower back or belly. Get help right away if you:  Have severe or uncontrolled pain.  Cannot urinate. Summary  Gout is painful swelling of the joints caused by inflammation.  The most common site of pain is the big  toe, but it can affect other joints in the body.  Medicines and dietary changes can help to prevent and treat gout attacks. This information is not intended to replace advice given to you by your health care provider. Make sure you discuss any questions you have with your health care provider. Document Released: 03/06/2000 Document Revised: 09/29/2017 Document Reviewed: 09/29/2017 Elsevier Patient Education  2020 Elsevier Inc.  

## 2018-10-07 ENCOUNTER — Telehealth: Payer: Self-pay

## 2018-10-07 LAB — CBC WITH DIFFERENTIAL/PLATELET
Basophils Absolute: 0.1 10*3/uL (ref 0.0–0.2)
Basos: 1 %
EOS (ABSOLUTE): 0.1 10*3/uL (ref 0.0–0.4)
Eos: 1 %
Hematocrit: 43.1 % (ref 34.0–46.6)
Hemoglobin: 14.5 g/dL (ref 11.1–15.9)
Immature Grans (Abs): 0 10*3/uL (ref 0.0–0.1)
Immature Granulocytes: 0 %
Lymphocytes Absolute: 1.9 10*3/uL (ref 0.7–3.1)
Lymphs: 18 %
MCH: 31.3 pg (ref 26.6–33.0)
MCHC: 33.6 g/dL (ref 31.5–35.7)
MCV: 93 fL (ref 79–97)
Monocytes Absolute: 0.9 10*3/uL (ref 0.1–0.9)
Monocytes: 9 %
Neutrophils Absolute: 7.6 10*3/uL — ABNORMAL HIGH (ref 1.4–7.0)
Neutrophils: 71 %
Platelets: 235 10*3/uL (ref 150–450)
RBC: 4.64 x10E6/uL (ref 3.77–5.28)
RDW: 13.2 % (ref 11.7–15.4)
WBC: 10.6 10*3/uL (ref 3.4–10.8)

## 2018-10-07 LAB — BASIC METABOLIC PANEL
BUN/Creatinine Ratio: 17 (ref 12–28)
BUN: 14 mg/dL (ref 8–27)
CO2: 23 mmol/L (ref 20–29)
Calcium: 9 mg/dL (ref 8.7–10.3)
Chloride: 101 mmol/L (ref 96–106)
Creatinine, Ser: 0.83 mg/dL (ref 0.57–1.00)
GFR calc Af Amer: 81 mL/min/{1.73_m2} (ref >59)
GFR calc non Af Amer: 70 mL/min/{1.73_m2} (ref >59)
Glucose: 94 mg/dL (ref 65–99)
Potassium: 4.5 mmol/L (ref 3.5–5.2)
Sodium: 141 mmol/L (ref 134–144)

## 2018-10-07 LAB — URIC ACID: Uric Acid: 3.9 mg/dL (ref 2.5–7.1)

## 2018-10-07 NOTE — Telephone Encounter (Signed)
-----   Message from Mar Daring, [place] sent at 10/07/2018  8:30 AM EDT ----- Soft tissue swelling noted on the medial ankle where it was red and tender to touch. No fractures or bony abnormality.

## 2018-10-07 NOTE — Telephone Encounter (Signed)
Patient advised as directed below. 

## 2018-10-07 NOTE — Telephone Encounter (Signed)
-----   Message from Mar Daring, [place] sent at 10/07/2018  8:31 AM EDT ----- Labs are all normal. I do not think this is gout as your uric acid level is normal. You may have injured the area and not realized since there was soft tissue swelling noted on the xray. The prednisone should help. If symptoms worsen or fail to resolve please call the office.

## 2018-10-09 ENCOUNTER — Other Ambulatory Visit: Payer: Self-pay | Admitting: Physician Assistant

## 2018-10-09 DIAGNOSIS — M1991 Primary osteoarthritis, unspecified site: Secondary | ICD-10-CM

## 2018-10-31 ENCOUNTER — Telehealth: Payer: Self-pay

## 2018-10-31 ENCOUNTER — Telehealth: Payer: Self-pay | Admitting: Physician Assistant

## 2018-10-31 NOTE — Telephone Encounter (Signed)
Patient scheduled.

## 2018-10-31 NOTE — Telephone Encounter (Signed)
Encounter error

## 2018-10-31 NOTE — Telephone Encounter (Signed)
If she has no other symptoms she can come in

## 2018-10-31 NOTE — Telephone Encounter (Signed)
Ears popping - stopped up - about week.  Has had this in the past.  Pt wanting to come in to get them looked at and cleaned out.  Please call pt back and advise/give an appt.  Thanks, [employer]

## 2018-10-31 NOTE — Telephone Encounter (Signed)
Please advise 

## 2018-11-01 NOTE — Progress Notes (Signed)
Patient: Robin Espinoza Female    DOB: 13-Oct-1945   73 y.o.   MRN: [id number] Visit Date: 11/02/2018  Today's Provider: Mar Daring, PA-C   Chief Complaint  Patient presents with  . Ear Fullness   Subjective:     HPI   Patient states both her ears are stopped up. Also having vaginal irritation. Thinks it may be yeast infection due to sweating.  No Known Allergies   Current Outpatient Medications:  .  amLODipine (NORVASC) 5 MG tablet, TAKE 1 TABLET BY MOUTH EVERY DAY, Disp: 90 tablet, Rfl: 2 .  etodolac (LODINE) 500 MG tablet, TAKE 1 TABLET BY MOUTH TWICE A DAY, Disp: 180 tablet, Rfl: 1 .  levothyroxine (SYNTHROID, LEVOTHROID) 112 MCG tablet, TAKE 1 TABLET (112 MCG TOTAL) BY MOUTH DAILY BEFORE BREAKFAST., Disp: 90 tablet, Rfl: 1 .  lovastatin (MEVACOR) 40 MG tablet, TAKE 1 TABLET BY MOUTH AT BEDTIME, Disp: 90 tablet, Rfl: 3 .  metoprolol succinate (TOPROL-XL) 100 MG 24 hr tablet, TAKE 1 TABLET (100 MG TOTAL) BY MOUTH DAILY. TAKE WITH OR IMMEDIATELY FOLLOWING A MEAL., Disp: 90 tablet, Rfl: 3 .  NON FORMULARY, CPAP, Disp: , Rfl:  .  omeprazole (PRILOSEC) 20 MG capsule, TAKE 1 CAPSULE BY MOUTH EVERY DAY, Disp: 90 capsule, Rfl: 3 .  traZODone (DESYREL) 50 MG tablet, TAKE 1 TABLET BY MOUTH EVERY DAY, Disp: 90 tablet, Rfl: 1 .  venlafaxine XR (EFFEXOR-XR) 75 MG 24 hr capsule, TAKE 2 CAPSULES (150 MG TOTAL) BY MOUTH DAILY WITH BREAKFAST., Disp: 180 capsule, Rfl: 1 .  Vitamin D, Ergocalciferol, (DRISDOL) 1.25 MG (50000 UT) CAPS capsule, TAKE 1 CAPSULE (50,000 UNITS TOTAL) BY MOUTH EVERY 7 (SEVEN) DAYS., Disp: 12 capsule, Rfl: 1 .  [facility] D3 1000 units capsule, TAKE ONE CAPSULE BY MOUTH EVERY DAY (Patient not taking: Reported on 10/06/2018), Disp: 90 capsule, Rfl: 3 .  FLUZONE HIGH-DOSE 0.5 ML injection, TO BE ADMINISTERED BY PHARMACIST FOR IMMUNIZATION, Disp: , Rfl: 0 .  methylPREDNISolone (MEDROL) 4 MG TBPK tablet, 6 day taper; take as directed on package instructions (Patient  not taking: Reported on 11/02/2018), Disp: 21 tablet, Rfl: 0  Review of Systems  Constitutional: Negative for appetite change, chills, fatigue and fever.  HENT: Positive for hearing loss.   Respiratory: Negative for chest tightness and shortness of breath.   Cardiovascular: Negative for chest pain and palpitations.  Gastrointestinal: Negative for abdominal pain, nausea and vomiting.  Neurological: Negative for dizziness and weakness.    Social History   Tobacco Use  . Smoking status: Former Research scientist (life sciences)  . Smokeless tobacco: Never Used  . Tobacco comment: in college  Substance Use Topics  . Alcohol use: Yes    Frequency: Never    Comment: rarely 1 drink      Objective:   BP (!) 143/96 (BP Location: Right Arm, Patient Position: Sitting, Cuff Size: Large)   Pulse 96   Temp 98.2 F (36.8 C) (Oral)   Resp 16   SpO2 96%  Vitals:   11/02/18 1344  BP: (!) 143/96  Pulse: 96  Resp: 16  Temp: 98.2 F (36.8 C)  TempSrc: Oral  SpO2: 96%     Physical Exam Vitals signs reviewed.  Constitutional:      General: She is not in acute distress.    Appearance: Normal appearance. She is well-developed. She is obese. She is not ill-appearing or diaphoretic.  HENT:     Head: Normocephalic and atraumatic.  Right Ear: Hearing, tympanic membrane, ear canal and external ear normal. There is impacted cerumen.     Left Ear: Hearing, tympanic membrane, ear canal and external ear normal. There is impacted cerumen.     Ears:     Comments: Lavage performed bilaterally and was successful    Nose: Nose normal.     Mouth/Throat:     Mouth: Mucous membranes are moist.     Pharynx: Uvula midline. No oropharyngeal exudate.  Eyes:     General: No scleral icterus.       Right eye: No discharge.        Left eye: No discharge.     Extraocular Movements: Extraocular movements intact.     Conjunctiva/sclera: Conjunctivae normal.     Pupils: Pupils are equal, round, and reactive to light.  Neck:      Musculoskeletal: Normal range of motion and neck supple.     Thyroid: No thyromegaly.     Trachea: No tracheal deviation.  Cardiovascular:     Rate and Rhythm: Normal rate and regular rhythm.     Heart sounds: Normal heart sounds. No murmur. No friction rub. No gallop.   Pulmonary:     Effort: Pulmonary effort is normal. No respiratory distress.     Breath sounds: Normal breath sounds. No stridor. No wheezing or rales.  Lymphadenopathy:     Cervical: No cervical adenopathy.  Skin:    General: Skin is warm and dry.  Neurological:     Mental Status: She is alert.      No results found for any visits on 11/02/18.     Assessment & Plan    1. Bilateral impacted cerumen Lavage successful bilaterally. Advised to use debrox at home to keep wax soft. Call if recurs.  - Ear Lavage  2. Vaginal yeast infection Gets yeast infections with heat and humidity due to body habitus. Diflucan given as below. Advised to hold Lovastatin while taking Diflucan.  - fluconazole (DIFLUCAN) 150 MG tablet; Take 1 tablet (150 mg total) by mouth once for 1 dose. May repeat in 48-72 hrs if needed  Dispense: 2 tablet; Refill: 0     Mar Daring, PA-C  [facility]

## 2018-11-02 ENCOUNTER — Ambulatory Visit (INDEPENDENT_AMBULATORY_CARE_PROVIDER_SITE_OTHER): Payer: PPO | Admitting: Physician Assistant

## 2018-11-02 ENCOUNTER — Encounter: Payer: Self-pay | Admitting: Physician Assistant

## 2018-11-02 ENCOUNTER — Other Ambulatory Visit: Payer: Self-pay

## 2018-11-02 VITALS — BP 143/96 | HR 96 | Temp 98.2°F | Resp 16

## 2018-11-02 DIAGNOSIS — B373 Candidiasis of vulva and vagina: Secondary | ICD-10-CM | POA: Diagnosis not present

## 2018-11-02 DIAGNOSIS — H6123 Impacted cerumen, bilateral: Secondary | ICD-10-CM | POA: Diagnosis not present

## 2018-11-02 DIAGNOSIS — B3731 Acute candidiasis of vulva and vagina: Secondary | ICD-10-CM

## 2018-11-02 MED ORDER — FLUCONAZOLE 150 MG PO TABS: 150.0000 mg | ORAL_TABLET | Freq: Once | ORAL | 0 refills | Status: AC

## 2018-11-02 NOTE — Patient Instructions (Signed)
Earwax Buildup, Adult The ears produce a substance called earwax that helps keep bacteria out of the ear and protects the skin in the ear canal. Occasionally, earwax can build up in the ear and cause discomfort or hearing loss. What increases the risk? This condition is more likely to develop in people who:  Are female.  Are elderly.  Naturally produce more earwax.  Clean their ears often with cotton swabs.  Use earplugs often.  Use in-ear headphones often.  Wear hearing aids.  Have narrow ear canals.  Have earwax that is overly thick or sticky.  Have eczema.  Are dehydrated.  Have excess hair in the ear canal. What are the signs or symptoms? Symptoms of this condition include:  Reduced or muffled hearing.  A feeling of fullness in the ear or feeling that the ear is plugged.  Fluid coming from the ear.  Ear pain.  Ear itch.  Ringing in the ear.  Coughing.  An obvious piece of earwax that can be seen inside the ear canal. How is this diagnosed? This condition may be diagnosed based on:  Your symptoms.  Your medical history.  An ear exam. During the exam, your health care provider will look into your ear with an instrument called an otoscope. You may have tests, including a hearing test. How is this treated? This condition may be treated by:  Using ear drops to soften the earwax.  Having the earwax removed by a health care provider. The health care provider may: ? Flush the ear with water. ? Use an instrument that has a loop on the end (curette). ? Use a suction device.  Surgery to remove the wax buildup. This may be done in severe cases. Follow these instructions at home:   Take over-the-counter and prescription medicines only as told by your health care provider.  Do not put any objects, including cotton swabs, into your ear. You can clean the opening of your ear canal with a washcloth or facial tissue.  Follow instructions from your health care  provider about cleaning your ears. Do not over-clean your ears.  Drink enough fluid to keep your urine clear or pale yellow. This will help to thin the earwax.  Keep all follow-up visits as told by your health care provider. If earwax builds up in your ears often or if you use hearing aids, consider seeing your health care provider for routine, preventive ear cleanings. Ask your health care provider how often you should schedule your cleanings.  If you have hearing aids, clean them according to instructions from the manufacturer and your health care provider. Contact a health care provider if:  You have ear pain.  You develop a fever.  You have blood, pus, or other fluid coming from your ear.  You have hearing loss.  You have ringing in your ears that does not go away.  Your symptoms do not improve with treatment.  You feel like the room is spinning (vertigo). Summary  Earwax can build up in the ear and cause discomfort or hearing loss.  The most common symptoms of this condition include reduced or muffled hearing and a feeling of fullness in the ear or feeling that the ear is plugged.  This condition may be diagnosed based on your symptoms, your medical history, and an ear exam.  This condition may be treated by using ear drops to soften the earwax or by having the earwax removed by a health care provider.  Do not put any   objects, including cotton swabs, into your ear. You can clean the opening of your ear canal with a washcloth or facial tissue. This information is not intended to replace advice given to you by your health care provider. Make sure you discuss any questions you have with your health care provider. Document Released: 04/16/2004 Document Revised: 02/19/2017 Document Reviewed: 05/20/2016 Elsevier Patient Education  2020 Elsevier Inc.  

## 2018-11-09 ENCOUNTER — Telehealth: Payer: Self-pay | Admitting: Physician Assistant

## 2018-11-09 DIAGNOSIS — B372 Candidiasis of skin and nail: Secondary | ICD-10-CM

## 2018-11-09 NOTE — Telephone Encounter (Signed)
Pt only took 1 of the 2 pills.  Pt wants to know if she needs to go ahead and take it?  Pt also needs a topical vaginal cream called into:  [id number] [address], [place] [phone] (Phone) [phone] (Fax)   Please advise.  Thanks, [employer]

## 2018-11-09 NOTE — Telephone Encounter (Signed)
Please Review

## 2018-11-09 NOTE — Telephone Encounter (Signed)
Per patient she cannot remember the name she said it has been years since it was prescribed.  The only one I found in [facility] is the Clotrimazole-Betamethasone 1-0.05% but she said to just send the one that you feel is best.

## 2018-11-09 NOTE — Telephone Encounter (Signed)
Is she wanting nystatin or terazol cream for yeast? Is that the vaginal cream she means?  Yes she can take the second diflucan.

## 2018-11-10 MED ORDER — CLOTRIMAZOLE-BETAMETHASONE 1-0.05 % EX CREA: 1.0000 "application " | TOPICAL_CREAM | Freq: Two times a day (BID) | CUTANEOUS | 0 refills | Status: DC

## 2018-11-10 NOTE — Telephone Encounter (Signed)
Patient was advised.  

## 2018-11-10 NOTE — Telephone Encounter (Signed)
Sent in the clotrimazole-betamethasone since that is what was on file

## 2018-11-12 ENCOUNTER — Other Ambulatory Visit: Payer: Self-pay | Admitting: Physician Assistant

## 2018-11-12 DIAGNOSIS — I10 Essential (primary) hypertension: Secondary | ICD-10-CM

## 2018-11-12 DIAGNOSIS — E039 Hypothyroidism, unspecified: Secondary | ICD-10-CM

## 2018-11-19 ENCOUNTER — Other Ambulatory Visit: Payer: Self-pay | Admitting: Physician Assistant

## 2018-11-19 DIAGNOSIS — F32A Depression, unspecified: Secondary | ICD-10-CM

## 2018-11-19 DIAGNOSIS — F329 Major depressive disorder, single episode, unspecified: Secondary | ICD-10-CM

## 2018-12-21 DIAGNOSIS — M25562 Pain in left knee: Secondary | ICD-10-CM | POA: Diagnosis not present

## 2018-12-21 DIAGNOSIS — M25561 Pain in right knee: Secondary | ICD-10-CM | POA: Diagnosis not present

## 2018-12-21 DIAGNOSIS — M170 Bilateral primary osteoarthritis of knee: Secondary | ICD-10-CM | POA: Diagnosis not present

## 2019-02-09 ENCOUNTER — Other Ambulatory Visit: Payer: Self-pay | Admitting: Physician Assistant

## 2019-02-09 DIAGNOSIS — F5101 Primary insomnia: Secondary | ICD-10-CM

## 2019-02-21 ENCOUNTER — Telehealth: Payer: Self-pay | Admitting: Physician Assistant

## 2019-02-21 NOTE — Telephone Encounter (Signed)
Pt stated she has not been able to hold her bladder. She has to rush to the bathroom or she will have an accident. She would like to know what Robin Espinoza recommends to help with this and would prefer not to come to the office if possible.

## 2019-02-21 NOTE — Telephone Encounter (Signed)
From [facility] 

## 2019-02-21 NOTE — Telephone Encounter (Signed)
Requires appt, virtual ok

## 2019-02-22 NOTE — Progress Notes (Signed)
Patient: Robin Espinoza Female    DOB: 11-07-1945   73 y.o.   MRN: [id number] Visit Date: 02/23/2019  Today's Provider: Mar Daring, PA-C   No chief complaint on file.  Subjective:     Virtual Visit via Video Note  I connected with SHREYA GUSE on 02/23/19 at 10:40 AM EST by a video enabled telemedicine application and verified that I am speaking with the correct person using two identifiers.  Location: Patient: Home Provider: [facility]   I discussed the limitations of evaluation and management by telemedicine and the availability of in person appointments. The patient expressed understanding and agreed to proceed.  Urinary Frequency  This is a new problem. The problem has been gradually worsening. There has been no fever. Associated symptoms include frequency and urgency. Pertinent negatives include no chills, discharge, flank pain, hematuria, hesitancy, nausea, sweats or vomiting.    Patient reports her issue is more incontinence. She has issues with urine leaking when she feels she has to go to the restroom. She states she will feel the need to use the restroom and will leak as she is trying to get to the restroom. Does have a few nighttime awakenings as well.    No Known Allergies   Current Outpatient Medications:  .  amLODipine (NORVASC) 5 MG tablet, TAKE 1 TABLET BY MOUTH EVERY DAY, Disp: 90 tablet, Rfl: 2 .  clotrimazole-betamethasone (LOTRISONE) cream, Apply 1 application topically 2 (two) times daily., Disp: 30 g, Rfl: 0 .  etodolac (LODINE) 500 MG tablet, TAKE 1 TABLET BY MOUTH TWICE A DAY, Disp: 180 tablet, Rfl: 1 .  levothyroxine (SYNTHROID) 112 MCG tablet, TAKE 1 TABLET (112 MCG TOTAL) BY MOUTH DAILY BEFORE BREAKFAST., Disp: 90 tablet, Rfl: 1 .  lovastatin (MEVACOR) 40 MG tablet, TAKE 1 TABLET BY MOUTH AT BEDTIME, Disp: 90 tablet, Rfl: 3 .  metoprolol succinate (TOPROL-XL) 100 MG 24 hr tablet, TAKE 1 TABLET (100 MG TOTAL) BY MOUTH DAILY. TAKE WITH OR  IMMEDIATELY FOLLOWING A MEAL., Disp: 90 tablet, Rfl: 3 .  NON FORMULARY, CPAP, Disp: , Rfl:  .  omeprazole (PRILOSEC) 20 MG capsule, TAKE 1 CAPSULE BY MOUTH EVERY DAY, Disp: 90 capsule, Rfl: 3 .  traZODone (DESYREL) 50 MG tablet, TAKE 1 TABLET BY MOUTH EVERY DAY, Disp: 90 tablet, Rfl: 1 .  venlafaxine XR (EFFEXOR-XR) 75 MG 24 hr capsule, TAKE 2 CAPSULES (150 MG TOTAL) BY MOUTH DAILY WITH BREAKFAST., Disp: 180 capsule, Rfl: 1 .  Vitamin D, Ergocalciferol, (DRISDOL) 1.25 MG (50000 UT) CAPS capsule, TAKE 1 CAPSULE (50,000 UNITS TOTAL) BY MOUTH EVERY 7 (SEVEN) DAYS., Disp: 12 capsule, Rfl: 1 .  CVS D3 1000 units capsule, TAKE ONE CAPSULE BY MOUTH EVERY DAY (Patient not taking: Reported on 10/06/2018), Disp: 90 capsule, Rfl: 3 .  FLUZONE HIGH-DOSE 0.5 ML injection, TO BE ADMINISTERED BY PHARMACIST FOR IMMUNIZATION, Disp: , Rfl: 0 .  methylPREDNISolone (MEDROL) 4 MG TBPK tablet, 6 day taper; take as directed on package instructions (Patient not taking: Reported on 11/02/2018), Disp: 21 tablet, Rfl: 0  Review of Systems  Constitutional: Negative.  Negative for chills.  Gastrointestinal: Negative.  Negative for nausea and vomiting.  Genitourinary: Positive for enuresis, frequency and urgency. Negative for decreased urine volume, difficulty urinating, dyspareunia, dysuria, flank pain, genital sores, hematuria, hesitancy, menstrual problem, pelvic pain, vaginal bleeding, vaginal discharge and vaginal pain.    Social History   Tobacco Use  . Smoking status: Former Research scientist (life sciences)  .  Smokeless tobacco: Never Used  . Tobacco comment: in college  Substance Use Topics  . Alcohol use: Yes    Frequency: Never    Comment: rarely 1 drink      Objective:   Temp 97.6 F (36.4 C) (Oral)  Vitals:   02/23/19 1016  Temp: 97.6 F (36.4 C)  TempSrc: Oral  There is no height or weight on file to calculate BMI.   Physical Exam Vitals signs reviewed.  Constitutional:      General: She is not in acute distress.  Pulmonary:     Effort: No respiratory distress.  Neurological:     Mental Status: She is alert.      No results found for any visits on 02/23/19.     Assessment & Plan    1. OAB (overactive bladder) Discussed conservative therapy with kegel exercises, timed toileting exercises (going to the bathroom every 2 hours) and medications. She is going to try the exercises and toileting habits but would also like to try oxybutynin. This was sent in as below. Patient has CPE on 04/03/19 and can f/u then. Call if having side effects or not effective.  - oxybutynin (DITROPAN-XL) 10 MG 24 hr tablet; Take 1 tablet (10 mg total) by mouth at bedtime.  Dispense: 90 tablet; Refill: 0  2. Urge incontinence of urine See above medical treatment plan. - oxybutynin (DITROPAN-XL) 10 MG 24 hr tablet; Take 1 tablet (10 mg total) by mouth at bedtime.  Dispense: 90 tablet; Refill: 0   I discussed the assessment and treatment plan with the patient. The patient was provided an opportunity to ask questions and all were answered. The patient agreed with the plan and demonstrated an understanding of the instructions.   The patient was advised to call back or seek an in-person evaluation if the symptoms worsen or if the condition fails to improve as anticipated.  I provided 12 minutes of non-face-to-face time during this encounter.    Mar Daring, PA-C  [facility]

## 2019-02-22 NOTE — Telephone Encounter (Signed)
Patient scheduled for a My Chart visit on 02/23/2019

## 2019-02-23 ENCOUNTER — Encounter: Payer: Self-pay | Admitting: Physician Assistant

## 2019-02-23 ENCOUNTER — Telehealth (INDEPENDENT_AMBULATORY_CARE_PROVIDER_SITE_OTHER): Payer: PPO | Admitting: Physician Assistant

## 2019-02-23 VITALS — Temp 97.6°F

## 2019-02-23 DIAGNOSIS — N3941 Urge incontinence: Secondary | ICD-10-CM

## 2019-02-23 DIAGNOSIS — N3281 Overactive bladder: Secondary | ICD-10-CM | POA: Diagnosis not present

## 2019-02-23 MED ORDER — OXYBUTYNIN CHLORIDE ER 10 MG PO TB24: 10.0000 mg | ORAL_TABLET | Freq: Every day | ORAL | 0 refills | Status: DC

## 2019-02-23 NOTE — Patient Instructions (Signed)
Urinary Incontinence  Urinary incontinence refers to a condition in which a person is unable to control where and when to pass urine. A person with this condition will urinate when he or she does not mean to (involuntarily). What are the causes? This condition may be caused by:  Medicines.  Infections.  Constipation.  Overactive bladder muscles.  Weak bladder muscles.  Weak pelvic floor muscles. These muscles provide support for the bladder, intestine, and, in women, the uterus.  Enlarged prostate in men. The prostate is a gland near the bladder. When it gets too big, it can pinch the urethra. With the urethra blocked, the bladder can weaken and lose the ability to empty properly.  Surgery.  Emotional factors, such as anxiety, stress, or post-traumatic stress disorder (PTSD).  Pelvic organ prolapse. This happens in women when organs shift out of place and into the vagina. This shift can prevent the bladder and urethra from working properly. What increases the risk? The following factors may make you more likely to develop this condition:  Older age.  Obesity and physical inactivity.  Pregnancy and childbirth.  Menopause.  Diseases that affect the nerves or spinal cord (neurological diseases).  Long-term (chronic) coughing. This can increase pressure on the bladder and pelvic floor muscles. What are the signs or symptoms? Symptoms may vary depending on the type of urinary incontinence you have. They include:  A sudden urge to urinate, but passing urine involuntarily before you can get to a bathroom (urge incontinence).  Suddenly passing urine with any activity that forces urine to pass, such as coughing, laughing, exercise, or sneezing (stress incontinence).  Needing to urinate often, but urinating only a Bramblett amount, or constantly dribbling urine (overflow incontinence).  Urinating because you cannot get to the bathroom in time due to a physical disability, such as  arthritis or injury, or communication and thinking problems, such as Alzheimer disease (functional incontinence). How is this diagnosed? This condition may be diagnosed based on:  Your medical history.  A physical exam.  Tests, such as: ? Urine tests. ? X-rays of your kidney and bladder. ? Ultrasound. ? CT scan. ? Cystoscopy. In this procedure, a health care provider inserts a tube with a light and camera (cystoscope) through the urethra and into the bladder in order to check for problems. ? Urodynamic testing. These tests assess how well the bladder, urethra, and sphincter can store and release urine. There are different types of urodynamic tests, and they vary depending on what the test is measuring. To help diagnose your condition, your health care provider may recommend that you keep a log of when you urinate and how much you urinate. How is this treated? Treatment for this condition depends on the type of incontinence that you have and its cause. Treatment may include:  Lifestyle changes, such as: ? Quitting smoking. ? Maintaining a healthy weight. ? Staying active. Try to get 150 minutes of moderate-intensity exercise every week. Ask your health care provider which activities are safe for you. ? Eating a healthy diet.  Avoid high-fat foods, like fried foods.  Avoid refined carbohydrates like white bread and white rice.  Limit how much alcohol and caffeine you drink.  Increase your fiber intake. Foods such as fresh fruits, vegetables, beans, and whole grains are healthy sources of fiber.  Pelvic floor muscle exercises.  Bladder training, such as lengthening the amount of time between bathroom breaks, or using the bathroom at regular intervals.  Using techniques to suppress bladder urges.   This can include distraction techniques or controlled breathing exercises.  Medicines to relax the bladder muscles and prevent bladder spasms.  Medicines to help slow or prevent the  growth of a man's prostate.  Botox injections. These can help relax the bladder muscles.  Using pulses of electricity to help change bladder reflexes (electrical nerve stimulation).  For women, using a medical device to prevent urine leaks. This is a Kuznia, tampon-like, disposable device that is inserted into the urethra.  Injecting collagen or carbon beads (bulking agents) into the urinary sphincter. These can help thicken tissue and close the bladder opening.  Surgery. Follow these instructions at home: Lifestyle  Limit alcohol and caffeine. These can fill your bladder quickly and irritate it.  Keep yourself clean to help prevent odors and skin damage. Ask your doctor about special skin creams and cleansers that can protect the skin from urine.  Consider wearing pads or adult diapers. Make sure to change them regularly, and always change them right after experiencing incontinence. General instructions  Take over-the-counter and prescription medicines only as told by your health care provider.  Use the bathroom about every 3-4 hours, even if you do not feel the need to urinate. Try to empty your bladder completely every time. After urinating, wait a minute. Then try to urinate again.  Make sure you are in a relaxed position while urinating.  If your incontinence is caused by nerve problems, keep a log of the medicines you take and the times you go to the bathroom.  Keep all follow-up visits as told by your health care provider. This is important. Contact a health care provider if:  You have pain that gets worse.  Your incontinence gets worse. Get help right away if:  You have a fever or chills.  You are unable to urinate.  You have redness in your groin area or down your legs. Summary  Urinary incontinence refers to a condition in which a person is unable to control where and when to pass urine.  This condition may be caused by medicines, infection, weak bladder  muscles, weak pelvic floor muscles, enlargement of the prostate (in men), or surgery.  The following factors increase your risk for developing this condition: older age, obesity, pregnancy and childbirth, menopause, neurological diseases, and chronic coughing.  There are several types of urinary incontinence. They include urge incontinence, stress incontinence, overflow incontinence, and functional incontinence.  This condition is usually treated first with lifestyle and behavioral changes, such as quitting smoking, eating a healthier diet, and doing regular pelvic floor exercises. Other treatment options include medicines, bulking agents, medical devices, electrical nerve stimulation, or surgery. This information is not intended to replace advice given to you by your health care provider. Make sure you discuss any questions you have with your health care provider. Document Released: 04/16/2004 Document Revised: 03/19/2017 Document Reviewed: 06/18/2016 Elsevier Patient Education  [place].   Overactive Bladder, Adult  Overactive bladder refers to a condition in which a person has a sudden need to pass urine. The person may leak urine if he or she cannot get to the bathroom fast enough (urinary incontinence). A person with this condition may also wake up several times in the night to go to the bathroom. Overactive bladder is associated with poor nerve signals between your bladder and your brain. Your bladder may get the signal to empty before it is full. You may also have very sensitive muscles that make your bladder squeeze too soon. These symptoms  might interfere with daily work or social activities. What are the causes? This condition may be associated with or caused by:  Urinary tract infection.  Infection of nearby tissues, such as the prostate.  Prostate enlargement.  Surgery on the uterus or urethra.  Bladder stones, inflammation, or tumors.  Drinking too much caffeine or  alcohol.  Certain medicines, especially medicines that get rid of extra fluid in the body (diuretics).  Muscle or nerve weakness, especially from: ? A spinal cord injury. ? Stroke. ? Multiple sclerosis. ? Parkinson's disease.  Diabetes.  Constipation. What increases the risk? You may be at greater risk for overactive bladder if you:  Are an older adult.  Smoke.  Are going through menopause.  Have prostate problems.  Have a neurological disease, such as stroke, dementia, Parkinson's disease, or multiple sclerosis (MS).  Eat or drink things that irritate the bladder. These include alcohol, spicy food, and caffeine.  Are overweight or obese. What are the signs or symptoms? Symptoms of this condition include:  Sudden, strong urge to urinate.  Leaking urine.  Urinating 8 or more times a day.  Waking up to urinate 2 or more times a night. How is this diagnosed? Your health care provider may suspect overactive bladder based on your symptoms. He or she will diagnose this condition by:  A physical exam and medical history.  Blood or urine tests. You might need bladder or urine tests to help determine what is causing your overactive bladder. You might also need to see a health care provider who specializes in urinary tract problems (urologist). How is this treated? Treatment for overactive bladder depends on the cause of your condition and whether it is mild or severe. You can also make lifestyle changes at home. Options include:  Bladder training. This may include: ? Learning to control the urge to urinate by following a schedule that directs you to urinate at regular intervals (timed voiding). ? Doing Kegel exercises to strengthen your pelvic floor muscles, which support your bladder. Toning these muscles can help you control urination, even if your bladder muscles are overactive.  Special devices. This may include: ? Biofeedback, which uses sensors to help you become  aware of your body's signals. ? Electrical stimulation, which uses electrodes placed inside the body (implanted) or outside the body. These electrodes send gentle pulses of electricity to strengthen the nerves or muscles that control the bladder. ? Women may use a plastic device that fits into the vagina and supports the bladder (pessary).  Medicines. ? Antibiotics to treat bladder infection. ? Antispasmodics to stop the bladder from releasing urine at the wrong time. ? Tricyclic antidepressants to relax bladder muscles. ? Injections of botulinum toxin type A directly into the bladder tissue to relax bladder muscles.  Lifestyle changes. This may include: ? Weight loss. Talk to your health care provider about weight loss methods that would work best for you. ? Diet changes. This may include reducing how much alcohol and caffeine you consume, or drinking fluids at different times of the day. ? Not smoking. Do not use any products that contain nicotine or tobacco, such as cigarettes and e-cigarettes. If you need help quitting, ask your health care provider.  Surgery. ? A device may be implanted to help manage the nerve signals that control urination. ? An electrode may be implanted to stimulate electrical signals in the bladder. ? A procedure may be done to change the shape of the bladder. This is done only in very  severe cases. Follow these instructions at home: Lifestyle  Make any diet or lifestyle changes that are recommended by your health care provider. These may include: ? Drinking less fluid or drinking fluids at different times of the day. ? Cutting down on caffeine or alcohol. ? Doing Kegel exercises. ? Losing weight if needed. ? Eating a healthy and balanced diet to prevent constipation. This may include:  Eating foods that are high in fiber, such as fresh fruits and vegetables, whole grains, and beans.  Limiting foods that are high in fat and processed sugars, such as fried and  sweet foods. General instructions  Take over-the-counter and prescription medicines only as told by your health care provider.  If you were prescribed an antibiotic medicine, take it as told by your health care provider. Do not stop taking the antibiotic even if you start to feel better.  Use any implants or pessary as told by your health care provider.  If needed, wear pads to absorb urine leakage.  Keep a journal or log to track how much and when you drink and when you feel the need to urinate. This will help your health care provider monitor your condition.  Keep all follow-up visits as told by your health care provider. This is important. Contact a health care provider if:  You have a fever.  Your symptoms do not get better with treatment.  Your pain and discomfort get worse.  You have more frequent urges to urinate. Get help right away if:  You are not able to control your bladder. Summary  Overactive bladder refers to a condition in which a person has a sudden need to pass urine.  Several conditions may lead to an overactive bladder.  Treatment for overactive bladder depends on the cause and severity of your condition.  Follow your health care provider's instructions about lifestyle changes, doing Kegel exercises, keeping a journal, and taking medicines. This information is not intended to replace advice given to you by your health care provider. Make sure you discuss any questions you have with your health care provider. Document Released: 01/03/2009 Document Revised: 06/30/2018 Document Reviewed: 03/25/2017 Elsevier Patient Education  [place].  Oxybutynin extended-release tablets What is this medicine? OXYBUTYNIN (ox i BYOO ti nin) is used to treat overactive bladder. This medicine reduces the amount of bathroom visits. It may also help to control wetting accidents. This medicine may be used for other purposes; ask your health care provider or pharmacist  if you have questions. COMMON BRAND NAME(S): Ditropan XL What should I tell my health care provider before I take this medicine? They need to know if you have any of these conditions:  autonomic neuropathy  dementia  difficulty passing urine  glaucoma  intestinal obstruction  kidney disease  liver disease  myasthenia gravis  Parkinson's disease  an unusual or allergic reaction to oxybutynin, other medicines, foods, dyes, or preservatives  pregnant or trying to get pregnant  breast-feeding How should I use this medicine? Take this medicine by mouth with a glass of water. Swallow whole, do not crush, cut, or chew. Follow the directions on the prescription label. You can take this medicine with or without food. Take your doses at regular intervals. Do not take your medicine more often than directed. Talk to your pediatrician regarding the use of this medicine in children. Special care may be needed. While this drug may be prescribed for children as young as 6 years for selected conditions, precautions do apply. Overdosage:  If you think you have taken too much of this medicine contact a poison control center or emergency room at once. NOTE: This medicine is only for you. Do not share this medicine with others. What if I miss a dose? If you miss a dose, take it as soon as you can. If it is almost time for your next dose, take only that dose. Do not take double or extra doses. What may interact with this medicine?  antihistamines for allergy, cough and cold  atropine  certain medicines for bladder problems like oxybutynin, tolterodine  certain medicines for Parkinson's disease like benztropine, trihexyphenidyl  certain medicines for stomach problems like dicyclomine, hyoscyamine  certain medicines for travel sickness like scopolamine  clarithromycin  erythromycin  ipratropium  medicines for fungal infections, like fluconazole, itraconazole, ketoconazole or  voriconazole This list may not describe all possible interactions. Give your health care provider a list of all the medicines, herbs, non-prescription drugs, or dietary supplements you use. Also tell them if you smoke, drink alcohol, or use illegal drugs. Some items may interact with your medicine. What should I watch for while using this medicine? It may take a few weeks to notice the full benefit from this medicine. You may need to limit your intake tea, coffee, caffeinated sodas, and alcohol. These drinks may make your symptoms worse. You may get drowsy or dizzy. Do not drive, use machinery, or do anything that needs mental alertness until you know how this medicine affects you. Do not stand or sit up quickly, especially if you are an older patient. This reduces the risk of dizzy or fainting spells. Alcohol may interfere with the effect of this medicine. Avoid alcoholic drinks. Your mouth may get dry. Chewing sugarless gum or sucking hard candy, and drinking plenty of water may help. Contact your doctor if the problem does not go away or is severe. This medicine may cause dry eyes and blurred vision. If you wear contact lenses, you may feel some discomfort. Lubricating drops may help. See your eyecare professional if the problem does not go away or is severe. You may notice the shells of the tablets in your stool from time to time. This is normal. Avoid extreme heat. This medicine can cause you to sweat less than normal. Your body temperature could increase to dangerous levels, which may lead to heat stroke. What side effects may I notice from receiving this medicine? Side effects that you should report to your doctor or health care professional as soon as possible:  allergic reactions like skin rash, itching or hives, swelling of the face, lips, or tongue  agitation  breathing problems  confusion  fever  flushing (reddening of the skin)  hallucinations  memory loss  pain or  difficulty passing urine  palpitations  unusually weak or tired Side effects that usually do not require medical attention (report to your doctor or health care professional if they continue or are bothersome):  constipation  headache  sexual difficulties (impotence) This list may not describe all possible side effects. Call your doctor for medical advice about side effects. You may report side effects to FDA at 1-800-FDA-1088. Where should I keep my medicine? Keep out of the reach of children. Store at room temperature between 15 and 30 degrees C (59 and 86 degrees F). Protect from moisture and humidity. Throw away any unused medicine after the expiration date. NOTE: This sheet is a summary. It may not cover all possible information. If you have questions about  this medicine, talk to your doctor, pharmacist, or health care provider.  2020 Elsevier/Gold Standard (2013-05-25 10:57:06)

## 2019-03-26 ENCOUNTER — Other Ambulatory Visit: Payer: Self-pay | Admitting: Physician Assistant

## 2019-03-26 DIAGNOSIS — E559 Vitamin D deficiency, unspecified: Secondary | ICD-10-CM

## 2019-03-26 NOTE — Telephone Encounter (Signed)
Requested medication (s) are due for refill today: yes  Requested medication (s) are on the active medication list: yes  Last refill:  01/02/2019  Future visit scheduled: yes  Notes to clinic:  50,000 IU strengths are not delegated   Requested Prescriptions  Pending Prescriptions Disp Refills   Vitamin D, Ergocalciferol, (DRISDOL) 1.25 MG (50000 UT) CAPS capsule [Pharmacy Med Name: VITAMIN D2 1.25MG (50,000 UNIT)] 12 capsule 1    Sig: TAKE 1 CAPSULE (50,000 UNITS TOTAL) BY MOUTH EVERY 7 (SEVEN) DAYS.      Endocrinology:  Vitamins - Vitamin D Supplementation Failed - 03/26/2019  1:06 AM      Failed - 50,000 IU strengths are not delegated      Failed - Phosphate in normal range and within 360 days    No results found for: PHOS        Failed - Vitamin D in normal range and within 360 days    Vit D, 25-Hydroxy  Date Value Ref Range Status  04/19/2018 20.3 (L) 30.0 - 100.0 ng/mL Final    Comment:    Vitamin D deficiency has been defined by the Institute of Medicine and an Endocrine Society practice guideline as a level of serum 25-OH vitamin D less than 20 ng/mL (1,2). The Endocrine Society went on to further define vitamin D insufficiency as a level between 21 and 29 ng/mL (2). 1. IOM (Institute of Medicine). 2010. Dietary reference    intakes for calcium and D. [place]: The    [employer]. 2. Holick MF, Binkley Katherine, Bischoff-Ferrari HA, et al.    Evaluation, treatment, and prevention of vitamin D    deficiency: an Endocrine Society clinical practice    guideline. JCEM. 2011 Jul; 96(7):1911-30.           Passed - Ca in normal range and within 360 days    Calcium  Date Value Ref Range Status  10/06/2018 9.0 8.7 - 10.3 mg/dL Final   Calcium, Total  Date Value Ref Range Status  05/03/2012 9.0 8.5 - 10.1 mg/dL Final          Passed - Valid encounter within last 12 months    Recent Outpatient Visits           1 month ago OAB (overactive bladder)   [place], [place]   4 months ago Bilateral impacted cerumen   [place], [place]   5 months ago Acute left ankle pain   [facility] [name] [facility], [place]   11 months ago Annual physical exam   [place], [place], [place]   1 year ago Bilateral impacted cerumen   [place], [place], [place]       Future Appointments             In 1 week [employer], [place]

## 2019-03-27 DIAGNOSIS — M170 Bilateral primary osteoarthritis of knee: Secondary | ICD-10-CM | POA: Diagnosis not present

## 2019-03-27 NOTE — Telephone Encounter (Signed)
Please review.  Do you still want her to take RX Vitamin D or change to OTC?  Thanks,   -Mickel Baas

## 2019-03-30 NOTE — Progress Notes (Signed)
Subjective:   Robin Espinoza is a 74 y.o. female who presents for Medicare Annual (Subsequent) preventive examination.    This visit is being conducted through telemedicine due to the COVID-19 pandemic. This patient has given me verbal consent via doximity to conduct this visit, patient states they are participating from their home address. Some vital signs may be absent or patient reported.    Patient identification: identified by name, DOB, and current address  Review of Systems:  N/A  Cardiac Risk Factors include: advanced age (>63men, >53 women);obesity (BMI >30kg/m2);hypertension;dyslipidemia     Objective:     Vitals: There were no vitals taken for this visit.  There is no height or weight on file to calculate BMI. Unable to obtain vitals due to visit being conducted via telephonically.   Advanced Directives 04/03/2019 03/28/2018 03/08/2017  Does Patient Have a Medical Advance Directive? No No Yes  Would patient like information on creating a medical advance directive? Yes (ED - Information included in AVS) Yes (MAU/Ambulatory/Procedural Areas - Information given) -    Tobacco Social History   Tobacco Use  Smoking Status Former Smoker  Smokeless Tobacco Never Used  Tobacco Comment   in college     Counseling given: Not Answered Comment: in college   Clinical Intake:  Pre-visit preparation completed: Yes  Pain : 0-10 Pain Score: 1  Pain Type: Chronic pain(arthritis) Pain Location: Knee Pain Orientation: Right, Left Pain Descriptors / Indicators: Aching Pain Frequency: Intermittent     Nutritional Risks: None Diabetes: No  How often do you need to have someone help you when you read instructions, pamphlets, or other written materials from your doctor or pharmacy?: 1 - Never  Interpreter Needed?: No  Information entered by :: [facility], LPN  Past Medical History:  Diagnosis Date  . Anxiety   . GERD (gastroesophageal reflux disease)   .  Hyperlipidemia 12/21/2005  . Hypertension   . Insomnia   . Obstructive sleep apnea   . Osteoarthrosis   . Vitamin D deficiency    Past Surgical History:  Procedure Laterality Date  . Cortisone injections in back    . TUBAL LIGATION  1981   Family History  Problem Relation Age of Onset  . Diabetes Father    Social History   Socioeconomic History  . Marital status: Married    Spouse name: Not on file  . Number of children: 3  . Years of education: Not on file  . Highest education level: Master's degree (e.g., MA, MS, MEng, MEd, MSW, MBA)  Occupational History  . Occupation: retired  Tobacco Use  . Smoking status: Former Research scientist (life sciences)  . Smokeless tobacco: Never Used  . Tobacco comment: in college  Substance and Sexual Activity  . Alcohol use: Yes    Comment: rarely 1 drink  . Drug use: No  . Sexual activity: Not on file  Other Topics Concern  . Not on file  Social History Narrative  . Not on file   Social Determinants of Health   Financial Resource Strain: Low Risk   . Difficulty of Paying Living Expenses: Not hard at all  Food Insecurity: No Food Insecurity  . Worried About Charity fundraiser in the Last Year: Never true  . Ran Out of Food in the Last Year: Never true  Transportation Needs: No Transportation Needs  . Lack of Transportation (Medical): No  . Lack of Transportation (Non-Medical): No  Physical Activity: Inactive  . Days of Exercise per Week: 0  days  . Minutes of Exercise per Session: 0 min  Stress: Stress Concern Present  . Feeling of Stress : To some extent  Social Connections: Not Isolated  . Frequency of Communication with Friends and Family: More than three times a week  . Frequency of Social Gatherings with Friends and Family: More than three times a week  . Attends Religious Services: More than 4 times per year  . Active Member of Clubs or Organizations: Yes  . Attends Archivist Meetings: More than 4 times per year  . Marital Status:  Married    Outpatient Encounter Medications as of 04/03/2019  Medication Sig  . amLODipine (NORVASC) 5 MG tablet TAKE 1 TABLET BY MOUTH EVERY DAY  . clotrimazole-betamethasone (LOTRISONE) cream Apply 1 application topically 2 (two) times daily. (Patient taking differently: Apply 1 application topically 2 (two) times daily. As needed)  . etodolac (LODINE) 500 MG tablet TAKE 1 TABLET BY MOUTH TWICE A DAY  . levothyroxine (SYNTHROID) 112 MCG tablet TAKE 1 TABLET (112 MCG TOTAL) BY MOUTH DAILY BEFORE BREAKFAST.  Marland Kitchen lovastatin (MEVACOR) 40 MG tablet TAKE 1 TABLET BY MOUTH AT BEDTIME  . metoprolol succinate (TOPROL-XL) 100 MG 24 hr tablet TAKE 1 TABLET (100 MG TOTAL) BY MOUTH DAILY. TAKE WITH OR IMMEDIATELY FOLLOWING A MEAL.  . Multiple Vitamin (MULTIVITAMIN) capsule Take 1 capsule by mouth daily.  . NON FORMULARY CPAP  . omeprazole (PRILOSEC) 20 MG capsule TAKE 1 CAPSULE BY MOUTH EVERY DAY  . oxybutynin (DITROPAN-XL) 10 MG 24 hr tablet Take 1 tablet (10 mg total) by mouth at bedtime.  . phentermine (ADIPEX-P) 37.5 MG tablet Take 1 tablet (37.5 mg total) by mouth daily before breakfast.  . traZODone (DESYREL) 50 MG tablet Take 2 tablets (100 mg total) by mouth daily.  Marland Kitchen venlafaxine XR (EFFEXOR-XR) 75 MG 24 hr capsule TAKE 2 CAPSULES (150 MG TOTAL) BY MOUTH DAILY WITH BREAKFAST.  Marland Kitchen Vitamin D, Ergocalciferol, (DRISDOL) 1.25 MG (50000 UT) CAPS capsule TAKE 1 CAPSULE (50,000 UNITS TOTAL) BY MOUTH EVERY 7 (SEVEN) DAYS.  Marland Kitchen FLUZONE HIGH-DOSE 0.5 ML injection TO BE ADMINISTERED BY PHARMACIST FOR IMMUNIZATION  . [DISCONTINUED] traZODone (DESYREL) 50 MG tablet TAKE 1 TABLET BY MOUTH EVERY DAY   No facility-administered encounter medications on file as of 04/03/2019.    Activities of Daily Living In your present state of health, do you have any difficulty performing the following activities: 04/03/2019  Hearing? N  Vision? N  Difficulty concentrating or making decisions? N  Walking or climbing stairs? Y    Comment Due to knee pains.  Dressing or bathing? N  Doing errands, shopping? N  Preparing Food and eating ? N  Using the Toilet? N  In the past six months, have you accidently leaked urine? Y  Comment Currently on treatment for leakage.  Do you have problems with loss of bowel control? N  Managing your Medications? N  Managing your Finances? N  Housekeeping or managing your Housekeeping? Y  Comment Has assistance cleaning home.  Some recent data might be hidden    Patient Care Team: Mar Daring, PA-C as PCP - General (Family Medicine) Birder Robson, MD as Referring Physician (Ophthalmology) Alcus Dad as Physician Assistant Thornton Park, MD as Referring Physician (Orthopedic Surgery)    Assessment:   This is a routine wellness examination for [place].  Exercise Activities and Dietary recommendations Current Exercise Habits: The patient does not participate in regular exercise at present, Exercise limited by: orthopedic condition(s)  Goals    . DIET - REDUCE PORTION SIZE     Recommend cutting portion sizes in half and eating 3 [place] meals a day with 2 healthy snack a day.        Fall Risk: Fall Risk  04/03/2019 03/28/2018 03/08/2017 03/10/2016 03/04/2015  Falls in the past year? 1 0 No No No  Number falls in past yr: 1 0 - - -  Injury with Fall? 0 0 - - -  Risk for fall due to : Other (Comment);Impaired mobility - - - -  Risk for fall due to: Comment due to knees buckling from arthritis - - - -    FALL RISK PREVENTION PERTAINING TO THE HOME:  Any stairs in or around the home? No If so, are there any without handrails? N/A  Home free of loose throw rugs in walkways, pet beds, electrical cords, etc? Yes  Adequate lighting in your home to reduce risk of falls? Yes   ASSISTIVE DEVICES UTILIZED TO PREVENT FALLS:  Life alert? No  Use of a cane, walker or w/c? Yes  Grab bars in the bathroom? Yes  Shower chair or bench in shower? No  Elevated  toilet seat or a handicapped toilet? Yes    TIMED UP AND GO:  Was the test performed? No .    Depression Screen PHQ 2/9 Scores 04/03/2019 04/03/2019 04/03/2019 03/28/2018  PHQ - 2 Score 1 1 0 1  PHQ- 9 Score - - - 7     Cognitive Function: Declined today.      6CIT Screen 03/28/2018 03/08/2017  What Year? 0 points 0 points  What month? 0 points 0 points  What time? 0 points 0 points  Count back from 20 0 points 0 points  Months in reverse 0 points 0 points  Repeat phrase 0 points 2 points  Total Score 0 2    Immunization History  Administered Date(s) Administered  . Influenza, High Dose Seasonal PF 12/03/2014, 01/28/2018  . Influenza-Unspecified 01/11/2016, 01/19/2017, 01/06/2019  . Pneumococcal Conjugate-13 03/08/2017  . Pneumococcal Polysaccharide-23 03/20/2011  . Td 03/10/2004    Qualifies for Shingles Vaccine? Yes . Due for Shingrix. Pt has been advised to call insurance company to determine out of pocket expense. Advised may also receive vaccine at local pharmacy or Health Dept. Verbalized acceptance and understanding.  Tdap: Although this vaccine is not a covered service during a Wellness Exam, does the patient still wish to receive this vaccine today?  No . Advised may receive this vaccine at local pharmacy or Health Dept. Aware to provide a copy of the vaccination record if obtained from local pharmacy or Health Dept. Verbalized acceptance and understanding.  Flu Vaccine: Up to date  Pneumococcal Vaccine: Completed series  Screening Tests Health Maintenance  Topic Date Due  . DEXA SCAN  04/16/2019 (Originally 08/01/2017)  . TETANUS/TDAP  04/16/2019 (Originally 03/10/2014)  . MAMMOGRAM  04/07/2019  . COLONOSCOPY  03/21/2023  . INFLUENZA VACCINE  Completed  . Hepatitis C Screening  Completed  . PNA vac Low Risk Adult  Completed    Cancer Screenings:  Colorectal Screening: Completed 03/20/13. Repeat every 10 years.  Mammogram: Completed 04/06/17. Repeat every  1-2 years as advised. Ordered 04/15/18. Pt to call and set up apt before expiration date.   Bone Density: Completed 08/01/12. Results reflect OSTEOPENIA. Repeat every 5 years. Ordered today. Pt provided with contact info and advised to call to schedule appt. Pt aware the office will call re: appt.  Lung Cancer Screening: (Low Dose CT Chest recommended if Age 69-80 years, 30 pack-year currently smoking OR have quit w/in 15years.) does not qualify.   Additional Screening:  Hepatitis C Screening: Up to date  Vision Screening: Recommended annual ophthalmology exams for early detection of glaucoma and other disorders of the eye.  Dental Screening: Recommended annual dental exams for proper oral hygiene  Community Resource Referral:  CRR required this visit?  No       Plan:  I have personally reviewed and addressed the Medicare Annual Wellness questionnaire and have noted the following in the patient's chart:  A. Medical and social history B. Use of alcohol, tobacco or illicit drugs  C. Current medications and supplements D. Functional ability and status E.  Nutritional status F.  Physical activity G. Advance directives H. List of other physicians I.  Hospitalizations, surgeries, and ER visits in previous 12 months J.  [place] such as hearing and vision if needed, cognitive and depression L. Referrals and appointments   In addition, I have reviewed and discussed with patient certain preventive protocols, quality metrics, and best practice recommendations. A written personalized care plan for preventive services as well as general preventive health recommendations were provided to patient. Nurse Health Advisor  Signed,    Pallas Wahlert [place], [place]  [id number] Nurse Health Advisor   Nurse Notes: Pt to call and schedule her mammogram this year.

## 2019-03-31 ENCOUNTER — Telehealth: Payer: Self-pay | Admitting: *Deleted

## 2019-03-31 NOTE — Telephone Encounter (Signed)
Please PEC message?

## 2019-03-31 NOTE — Telephone Encounter (Signed)
May need to notify patient that she is only seeing McKenzie on Monday. If she wants to also see me can we schedule her to my schedule please.

## 2019-03-31 NOTE — Telephone Encounter (Signed)
Message from [place], Robin Espinoza (Patient) Diamantina Monks, Robin Espinoza (Patient) General - Other  Reason for CRM: Pt stated she needs to have knee surgery but in order for her to get the surgery she has to lose weight. Pt requests a medication to assist her with losing weight. Pt stated she would like to discuss this when she comes in on Monday 04/03/19

## 2019-03-31 NOTE — Telephone Encounter (Signed)
Phone visit scheduled for 04/03/2019 at 9:40am  Thanks,   -Mickel Baas

## 2019-04-03 ENCOUNTER — Encounter: Payer: Self-pay | Admitting: Physician Assistant

## 2019-04-03 ENCOUNTER — Ambulatory Visit (INDEPENDENT_AMBULATORY_CARE_PROVIDER_SITE_OTHER): Payer: PPO | Admitting: Physician Assistant

## 2019-04-03 ENCOUNTER — Other Ambulatory Visit: Payer: Self-pay

## 2019-04-03 ENCOUNTER — Ambulatory Visit (INDEPENDENT_AMBULATORY_CARE_PROVIDER_SITE_OTHER): Payer: PPO

## 2019-04-03 DIAGNOSIS — Z6841 Body Mass Index (BMI) 40.0 and over, adult: Secondary | ICD-10-CM

## 2019-04-03 DIAGNOSIS — E2839 Other primary ovarian failure: Secondary | ICD-10-CM

## 2019-04-03 DIAGNOSIS — E782 Mixed hyperlipidemia: Secondary | ICD-10-CM

## 2019-04-03 DIAGNOSIS — E034 Atrophy of thyroid (acquired): Secondary | ICD-10-CM | POA: Diagnosis not present

## 2019-04-03 DIAGNOSIS — F5101 Primary insomnia: Secondary | ICD-10-CM | POA: Diagnosis not present

## 2019-04-03 DIAGNOSIS — Z0000 Encounter for general adult medical examination without abnormal findings: Secondary | ICD-10-CM

## 2019-04-03 DIAGNOSIS — I10 Essential (primary) hypertension: Secondary | ICD-10-CM | POA: Diagnosis not present

## 2019-04-03 DIAGNOSIS — E559 Vitamin D deficiency, unspecified: Secondary | ICD-10-CM | POA: Diagnosis not present

## 2019-04-03 MED ORDER — TRAZODONE HCL 50 MG PO TABS: 100.0000 mg | ORAL_TABLET | Freq: Every day | ORAL | 1 refills | Status: DC

## 2019-04-03 MED ORDER — PHENTERMINE HCL 37.5 MG PO TABS: 37.5000 mg | ORAL_TABLET | Freq: Every day | ORAL | 2 refills | Status: DC

## 2019-04-03 NOTE — Patient Instructions (Addendum)
[name] , Thank you for taking time to come for your Medicare Wellness Visit. I appreciate your ongoing commitment to your health goals. Please review the following plan we discussed and let me know if I can assist you in the future.   Screening recommendations/referrals: Colonoscopy: Up to date, due 02/2023 Mammogram: Order on file. Pt to all and schedule apt before expiration.  Bone Density: Ordered today. Pt provided with contact info and advised to call to schedule appt. Pt aware the office will call re: appt. Recommended yearly ophthalmology/optometry visit for glaucoma screening and checkup Recommended yearly dental visit for hygiene and checkup  Vaccinations: Influenza vaccine: Up to date Pneumococcal vaccine: Completed series Tdap vaccine: Pt declines today.  Shingles vaccine: Pt declines today.     Advanced directives: Please bring a copy of your POA (Power of Attorney) and/or Living Will to your next appointment once completed.   Conditions/risks identified: Pt to continue to work on eating smaller portion sizes and healthier options.   Next appointment: 04/27/19 @ 2:00 PM with [name].    Preventive Care 28 Years and Older, Female Preventive care refers to lifestyle choices and visits with your health care provider that can promote health and wellness. What does preventive care include?  A yearly physical exam. This is also called an annual well check.  Dental exams once or twice a year.  Routine eye exams. Ask your health care provider how often you should have your eyes checked.  Personal lifestyle choices, including:  Daily care of your teeth and gums.  Regular physical activity.  Eating a healthy diet.  Avoiding tobacco and drug use.  Limiting alcohol use.  Practicing safe sex.  Taking low-dose aspirin every day.  Taking vitamin and mineral supplements as recommended by your health care provider. What happens during an annual well check? The  services and screenings done by your health care provider during your annual well check will depend on your age, overall health, lifestyle risk factors, and family history of disease. Counseling  Your health care provider may ask you questions about your:  Alcohol use.  Tobacco use.  Drug use.  Emotional well-being.  Home and relationship well-being.  Sexual activity.  Eating habits.  History of falls.  Memory and ability to understand (cognition).  Work and work Statistician.  Reproductive health. Screening  You may have the following tests or measurements:  Height, weight, and BMI.  Blood pressure.  Lipid and cholesterol levels. These may be checked every 5 years, or more frequently if you are over 59 years old.  Skin check.  Lung cancer screening. You may have this screening every year starting at age 90 if you have a 30-pack-year history of smoking and currently smoke or have quit within the past 15 years.  Fecal occult blood test (FOBT) of the stool. You may have this test every year starting at age 62.  Flexible sigmoidoscopy or colonoscopy. You may have a sigmoidoscopy every 5 years or a colonoscopy every 10 years starting at age 51.  Hepatitis C blood test.  Hepatitis B blood test.  Sexually transmitted disease (STD) testing.  Diabetes screening. This is done by checking your blood sugar (glucose) after you have not eaten for a while (fasting). You may have this done every 1-3 years.  Bone density scan. This is done to screen for osteoporosis. You may have this done starting at age 53.  Mammogram. This may be done every 1-2 years. Talk to your health care  provider about how often you should have regular mammograms. Talk with your health care provider about your test results, treatment options, and if necessary, the need for more tests. Vaccines  Your health care provider may recommend certain vaccines, such as:  Influenza vaccine. This is recommended  every year.  Tetanus, diphtheria, and acellular pertussis (Tdap, Td) vaccine. You may need a Td booster every 10 years.  Zoster vaccine. You may need this after age 46.  Pneumococcal 13-valent conjugate (PCV13) vaccine. One dose is recommended after age 84.  Pneumococcal polysaccharide (PPSV23) vaccine. One dose is recommended after age 4. Talk to your health care provider about which screenings and vaccines you need and how often you need them. This information is not intended to replace advice given to you by your health care provider. Make sure you discuss any questions you have with your health care provider. Document Released: 04/05/2015 Document Revised: 11/27/2015 Document Reviewed: 01/08/2015 Elsevier Interactive Patient Education  2017 [place] Prevention in the Home Falls can cause injuries. They can happen to people of all ages. There are many things you can do to make your home safe and to help prevent falls. What can I do on the outside of my home?  Regularly fix the edges of walkways and driveways and fix any cracks.  Remove anything that might make you trip as you walk through a door, such as a raised step or threshold.  Trim any bushes or trees on the path to your home.  Use bright outdoor lighting.  Clear any walking paths of anything that might make someone trip, such as rocks or tools.  Regularly check to see if handrails are loose or broken. Make sure that both sides of any steps have handrails.  Any raised decks and porches should have guardrails on the edges.  Have any leaves, snow, or ice cleared regularly.  Use sand or salt on walking paths during winter.  Clean up any spills in your garage right away. This includes oil or grease spills. What can I do in the bathroom?  Use night lights.  Install grab bars by the toilet and in the tub and shower. Do not use towel bars as grab bars.  Use non-skid mats or decals in the tub or shower.  If  you need to sit down in the shower, use a plastic, non-slip stool.  Keep the floor dry. Clean up any water that spills on the floor as soon as it happens.  Remove soap buildup in the tub or shower regularly.  Attach bath mats securely with double-sided non-slip rug tape.  Do not have throw rugs and other things on the floor that can make you trip. What can I do in the bedroom?  Use night lights.  Make sure that you have a light by your bed that is easy to reach.  Do not use any sheets or blankets that are too big for your bed. They should not hang down onto the floor.  Have a firm chair that has side arms. You can use this for support while you get dressed.  Do not have throw rugs and other things on the floor that can make you trip. What can I do in the kitchen?  Clean up any spills right away.  Avoid walking on wet floors.  Keep items that you use a lot in easy-to-reach places.  If you need to reach something above you, use a strong step stool that has a grab bar.  Keep electrical cords out of the way.  Do not use floor polish or wax that makes floors slippery. If you must use wax, use non-skid floor wax.  Do not have throw rugs and other things on the floor that can make you trip. What can I do with my stairs?  Do not leave any items on the stairs.  Make sure that there are handrails on both sides of the stairs and use them. Fix handrails that are broken or loose. Make sure that handrails are as long as the stairways.  Check any carpeting to make sure that it is firmly attached to the stairs. Fix any carpet that is loose or worn.  Avoid having throw rugs at the top or bottom of the stairs. If you do have throw rugs, attach them to the floor with carpet tape.  Make sure that you have a light switch at the top of the stairs and the bottom of the stairs. If you do not have them, ask someone to add them for you. What else can I do to help prevent falls?  Wear shoes  that:  Do not have high heels.  Have rubber bottoms.  Are comfortable and fit you well.  Are closed at the toe. Do not wear sandals.  If you use a stepladder:  Make sure that it is fully opened. Do not climb a closed stepladder.  Make sure that both sides of the stepladder are locked into place.  Ask someone to hold it for you, if possible.  Clearly mark and make sure that you can see:  Any grab bars or handrails.  First and last steps.  Where the edge of each step is.  Use tools that help you move around (mobility aids) if they are needed. These include:  Canes.  Walkers.  Scooters.  Crutches.  Turn on the lights when you go into a dark area. Replace any light bulbs as soon as they burn out.  Set up your furniture so you have a clear path. Avoid moving your furniture around.  If any of your floors are uneven, fix them.  If there are any pets around you, be aware of where they are.  Review your medicines with your doctor. Some medicines can make you feel dizzy. This can increase your chance of falling. Ask your doctor what other things that you can do to help prevent falls. This information is not intended to replace advice given to you by your health care provider. Make sure you discuss any questions you have with your health care provider. Document Released: 01/03/2009 Document Revised: 08/15/2015 Document Reviewed: 04/13/2014 Elsevier Interactive Patient Education  2017 [employer].

## 2019-04-03 NOTE — Patient Instructions (Signed)
Phentermine tablets or capsules What is this medicine? PHENTERMINE (FEN ter meen) decreases your appetite. It is used with a reduced calorie diet and exercise to help you lose weight. This medicine may be used for other purposes; ask your health care provider or pharmacist if you have questions. COMMON BRAND NAME(S): Adipex-P, Atti-Plex P, Atti-Plex P Spansule, Fastin, Lomaira, Pro-Fast, Tara-8 What should I tell my health care provider before I take this medicine? They need to know if you have any of these conditions:  agitation or nervousness  diabetes  glaucoma  heart disease  high blood pressure  history of drug abuse or addiction  history of stroke  kidney disease  lung disease called Primary Pulmonary Hypertension (PPH)  taken an MAOI like Carbex, Eldepryl, Marplan, Nardil, or Parnate in last 14 days  taking stimulant medicines for attention disorders, weight loss, or to stay awake  thyroid disease  an unusual or allergic reaction to phentermine, other medicines, foods, dyes, or preservatives  pregnant or trying to get pregnant  breast-feeding How should I use this medicine? Take this medicine by mouth with a glass of water. Follow the directions on the prescription label. Take your medicine at regular intervals. Do not take it more often than directed. Do not stop taking except on your doctor's advice. Talk to your pediatrician regarding the use of this medicine in children. While this drug may be prescribed for children 17 years or older for selected conditions, precautions do apply. Overdosage: If you think you have taken too much of this medicine contact a poison control center or emergency room at once. NOTE: This medicine is only for you. Do not share this medicine with others. What if I miss a dose? If you miss a dose, take it as soon as you can. If it is almost time for your next dose, take only that dose. Do not take double or extra doses. What may interact  with this medicine? Do not take this medicine with any of the following medications:  MAOIs like Carbex, Eldepryl, Marplan, Nardil, and Parnate This medicine may also interact with the following medications:  alcohol  certain medicines for depression, anxiety, or psychotic disorders  certain medicines for high blood pressure  linezolid  medicines for colds or breathing difficulties like pseudoephedrine or phenylephrine  medicines for diabetes  sibutramine  stimulant medicines for attention disorders, weight loss, or to stay awake This list may not describe all possible interactions. Give your health care provider a list of all the medicines, herbs, non-prescription drugs, or dietary supplements you use. Also tell them if you smoke, drink alcohol, or use illegal drugs. Some items may interact with your medicine. What should I watch for while using this medicine? Visit your doctor or health care provider for regular checks on your progress. Do not stop taking except on your health care provider's advice. You may develop a severe reaction. Your health care provider will tell you how much medicine to take. Do not take this medicine close to bedtime. It may prevent you from sleeping. You may get drowsy or dizzy. Do not drive, use machinery, or do anything that needs mental alertness until you know how this medicine affects you. Do not stand or sit up quickly, especially if you are an older patient. This reduces the risk of dizzy or fainting spells. Alcohol may increase dizziness and drowsiness. Avoid alcoholic drinks. This medicine may affect blood sugar levels. Ask your healthcare provider if changes in diet or medicines are needed  if you have diabetes. Women should inform their health care provider if they wish to become pregnant or think they might be pregnant. Losing weight while pregnant is not advised and may cause harm to the unborn child. Talk to your health care provider for more  information. What side effects may I notice from receiving this medicine? Side effects that you should report to your doctor or health care professional as soon as possible:  allergic reactions like skin rash, itching or hives, swelling of the face, lips, or tongue  breathing problems  changes in emotions or moods  changes in vision  chest pain or chest tightness  fast, irregular heartbeat  feeling faint or lightheaded  increased blood pressure  irritable  restlessness  tremors  seizures  signs and symptoms of a stroke like changes in vision; confusion; trouble speaking or understanding; severe headaches; sudden numbness or weakness of the face, arm or leg; trouble walking; dizziness; loss of balance or coordination  unusually weak or tired Side effects that usually do not require medical attention (report to your doctor or health care professional if they continue or are bothersome):  changes in taste  constipation or diarrhea  dizziness  dry mouth  headache  trouble sleeping  upset stomach This list may not describe all possible side effects. Call your doctor for medical advice about side effects. You may report side effects to FDA at 1-800-FDA-1088. Where should I keep my medicine? Keep out of the reach of children. This medicine can be abused. Keep your medicine in a safe place to protect it from theft. Do not share this medicine with anyone. Selling or giving away this medicine is dangerous and against the law. This medicine may cause harm and death if it is taken by other adults, children, or pets. Return medicine that has not been used to an official disposal site. Contact the DEA at 1-800-882-9539 or your city/county government to find a site. If you cannot return the medicine, mix any unused medicine with a substance like cat litter or coffee grounds. Then throw the medicine away in a sealed container like a sealed bag or coffee can with a lid. Do not use the  medicine after the expiration date. Store at room temperature between 20 and 25 degrees C (68 and 77 degrees F). Keep container tightly closed. NOTE: This sheet is a summary. It may not cover all possible information. If you have questions about this medicine, talk to your doctor, pharmacist, or health care provider.  2020 [employer]vier/Gold Standard (2019-01-13 12:54:20)  

## 2019-04-03 NOTE — Progress Notes (Signed)
Name: Robin Espinoza   MRN: [id number]    DOB: 1945-05-28   Date:04/03/2019       Progress Note  Subjective  Chief Complaint Weight  No chief complaint on file.  Virtual Visit via Telephone Note  I connected with Robin Espinoza on 04/03/19 at  9:40 AM EST by telephone and verified that I am speaking with the correct person using two identifiers.  Location: Patient: Home Provider: [facility]   I discussed the limitations, risks, security and privacy concerns of performing an evaluation and management service by telephone and the availability of in person appointments. I also discussed with the patient that there may be a patient responsible charge related to this service. The patient expressed understanding and agreed to proceed.  HPI  Robin Espinoza is a 74 yr old female that presents via telephone visit today for weight loss counseling. 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 Mack Guise) she needed to lose weight before they would consider a TKR. She reports due to knee pain she cannot ambulate well, and has gotten to where she does not even drive. She admits to sitting and lying around more. Also having trouble sleeping more so now than previous (does have baseline insomnia) due to not being able to get comfortable. She did increase her trazodone to 100mg  from 50mg  and slept better.    No problem-specific Assessment & Plan notes found for this encounter.   Past Medical History:  Diagnosis Date  . Anxiety   . GERD (gastroesophageal reflux disease)   . Hyperlipidemia 12/21/2005  . Hypertension   . Insomnia   . Obstructive sleep apnea   . Osteoarthrosis   . Vitamin D deficiency     Social History   Tobacco Use  . Smoking status: Former Research scientist (life sciences)  . Smokeless tobacco: Never Used  . Tobacco comment: in college  Substance Use Topics  . Alcohol use: Yes    Comment: rarely 1 drink     Current Outpatient Medications:  .  amLODipine (NORVASC) 5 MG tablet, TAKE 1  TABLET BY MOUTH EVERY DAY, Disp: 90 tablet, Rfl: 2 .  clotrimazole-betamethasone (LOTRISONE) cream, Apply 1 application topically 2 (two) times daily. (Patient taking differently: Apply 1 application topically 2 (two) times daily. As needed), Disp: 30 g, Rfl: 0 .  etodolac (LODINE) 500 MG tablet, TAKE 1 TABLET BY MOUTH TWICE A DAY, Disp: 180 tablet, Rfl: 1 .  FLUZONE HIGH-DOSE 0.5 ML injection, TO BE ADMINISTERED BY PHARMACIST FOR IMMUNIZATION, Disp: , Rfl: 0 .  levothyroxine (SYNTHROID) 112 MCG tablet, TAKE 1 TABLET (112 MCG TOTAL) BY MOUTH DAILY BEFORE BREAKFAST., Disp: 90 tablet, Rfl: 1 .  lovastatin (MEVACOR) 40 MG tablet, TAKE 1 TABLET BY MOUTH AT BEDTIME, Disp: 90 tablet, Rfl: 3 .  metoprolol succinate (TOPROL-XL) 100 MG 24 hr tablet, TAKE 1 TABLET (100 MG TOTAL) BY MOUTH DAILY. TAKE WITH OR IMMEDIATELY FOLLOWING A MEAL., Disp: 90 tablet, Rfl: 3 .  Multiple Vitamin (MULTIVITAMIN) capsule, Take 1 capsule by mouth daily., Disp: , Rfl:  .  NON FORMULARY, CPAP, Disp: , Rfl:  .  omeprazole (PRILOSEC) 20 MG capsule, TAKE 1 CAPSULE BY MOUTH EVERY DAY, Disp: 90 capsule, Rfl: 3 .  oxybutynin (DITROPAN-XL) 10 MG 24 hr tablet, Take 1 tablet (10 mg total) by mouth at bedtime., Disp: 90 tablet, Rfl: 0 .  phentermine (ADIPEX-P) 37.5 MG tablet, Take 1 tablet (37.5 mg total) by mouth daily before breakfast., Disp: 30 tablet, Rfl:  2 .  traZODone (DESYREL) 50 MG tablet, Take 2 tablets (100 mg total) by mouth daily., Disp: 180 tablet, Rfl: 1 .  venlafaxine XR (EFFEXOR-XR) 75 MG 24 hr capsule, TAKE 2 CAPSULES (150 MG TOTAL) BY MOUTH DAILY WITH BREAKFAST., Disp: 180 capsule, Rfl: 1 .  Vitamin D, Ergocalciferol, (DRISDOL) 1.25 MG (50000 UT) CAPS capsule, TAKE 1 CAPSULE (50,000 UNITS TOTAL) BY MOUTH EVERY 7 (SEVEN) DAYS., Disp: 12 capsule, Rfl: 1  No Known Allergies  Review of Systems  Constitutional: Negative.   HENT: Negative.   Eyes: Negative.   Respiratory: Negative.   Cardiovascular: Negative.    Gastrointestinal: Negative.   Genitourinary: Negative.   Musculoskeletal: Positive for joint pain and myalgias.  Skin: Negative.   Neurological: Negative.   Psychiatric/Behavioral: Positive for depression. The patient has insomnia.     Objective  There were no vitals filed for this visit.   Physical Exam Vitals reviewed.  Constitutional:      General: She is not in acute distress.    Appearance: She is obese.  Pulmonary:     Effort: No respiratory distress.  Neurological:     Mental Status: She is alert.  Psychiatric:        Attention and Perception: Attention and perception normal.        Mood and Affect: Mood is anxious and depressed.        Speech: Speech normal.        Behavior: Behavior normal. Behavior is cooperative.     No results found for this or any previous visit (from the past 2160 hour(s)).   Assessment & Plan  1. Primary insomnia Increase trazodone to 100mg  as this helped her sleep better than the 50mg . Pain and not being able to get comfortable are triggers for worsening insomnia.  - traZODone (DESYREL) 50 MG tablet; Take 2 tablets (100 mg total) by mouth daily.  Dispense: 180 tablet; Refill: 1  2. Class 3 severe obesity due to excess calories with serious comorbidity and body mass index (BMI) of 40.0 to 44.9 in adult [facility]) Counseled patient on healthy lifestyle modifications including dieting and exercise.  Will use phentermine as below to help suppress appetite and help with weight loss so she can be a candidate for surgery. Discussed adverse effects and she is to call if any occur.  - CBC w/Diff/Platelet - Lipid Profile - HgB A1c - phentermine (ADIPEX-P) 37.5 MG tablet; Take 1 tablet (37.5 mg total) by mouth daily before breakfast.  Dispense: 30 tablet; Refill: 2  3. Essential hypertension Stable. Continue amlodipine 5mg , metoprolol XR 100mg  daily. Will check labs as below and f/u pending results. - Lipid Profile - HgB A1c  4. Hypothyroidism due  to acquired atrophy of thyroid Continue levothyroxine 118mcg. Will check labs as below and f/u pending results. 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 Continue Lovastatin 40mg . Will check labs as below and f/u pending results. - CBC w/Diff/Platelet - Lipid Profile - HgB A1c  6. Avitaminosis D H/O this and postmenopausal. Will check labs as below and f/u pending results. - CBC w/Diff/Platelet - Vitamin D (25 hydroxy)

## 2019-04-04 DIAGNOSIS — M170 Bilateral primary osteoarthritis of knee: Secondary | ICD-10-CM | POA: Diagnosis not present

## 2019-04-07 DIAGNOSIS — I10 Essential (primary) hypertension: Secondary | ICD-10-CM | POA: Diagnosis not present

## 2019-04-07 DIAGNOSIS — E034 Atrophy of thyroid (acquired): Secondary | ICD-10-CM | POA: Diagnosis not present

## 2019-04-07 DIAGNOSIS — E559 Vitamin D deficiency, unspecified: Secondary | ICD-10-CM | POA: Diagnosis not present

## 2019-04-07 DIAGNOSIS — Z6841 Body Mass Index (BMI) 40.0 and over, adult: Secondary | ICD-10-CM | POA: Diagnosis not present

## 2019-04-07 DIAGNOSIS — E782 Mixed hyperlipidemia: Secondary | ICD-10-CM | POA: Diagnosis not present

## 2019-04-08 LAB — CBC WITH DIFFERENTIAL/PLATELET
Basophils Absolute: 0 10*3/uL (ref 0.0–0.2)
Basos: 0 %
EOS (ABSOLUTE): 0 10*3/uL (ref 0.0–0.4)
Eos: 0 %
Hematocrit: 45.9 % (ref 34.0–46.6)
Hemoglobin: 15.4 g/dL (ref 11.1–15.9)
Immature Grans (Abs): 0 10*3/uL (ref 0.0–0.1)
Immature Granulocytes: 0 %
Lymphocytes Absolute: 2.5 10*3/uL (ref 0.7–3.1)
Lymphs: 23 %
MCH: 32.8 pg (ref 26.6–33.0)
MCHC: 33.6 g/dL (ref 31.5–35.7)
MCV: 98 fL — ABNORMAL HIGH (ref 79–97)
Monocytes Absolute: 1.3 10*3/uL — ABNORMAL HIGH (ref 0.1–0.9)
Monocytes: 11 %
Neutrophils Absolute: 7.3 10*3/uL — ABNORMAL HIGH (ref 1.4–7.0)
Neutrophils: 66 %
Platelets: 250 10*3/uL (ref 150–450)
RBC: 4.69 x10E6/uL (ref 3.77–5.28)
RDW: 13.1 % (ref 11.7–15.4)
WBC: 11.2 10*3/uL — ABNORMAL HIGH (ref 3.4–10.8)

## 2019-04-08 LAB — LIPID PANEL
Chol/HDL Ratio: 3.2 ratio (ref 0.0–4.4)
Cholesterol, Total: 177 mg/dL (ref 100–199)
HDL: 56 mg/dL (ref >39)
LDL Chol Calc (NIH): 93 mg/dL (ref 0–99)
Triglycerides: 161 mg/dL — ABNORMAL HIGH (ref 0–149)
VLDL Cholesterol Cal: 28 mg/dL (ref 5–40)

## 2019-04-08 LAB — HEMOGLOBIN A1C
Est. average glucose Bld gHb Est-mCnc: 114 mg/dL
Hgb A1c MFr Bld: 5.6 % (ref 4.8–5.6)

## 2019-04-08 LAB — TSH: TSH: 3.1 u[IU]/mL (ref 0.450–4.500)

## 2019-04-08 LAB — VITAMIN D 25 HYDROXY (VIT D DEFICIENCY, FRACTURES): Vit D, 25-Hydroxy: 26 ng/mL — ABNORMAL LOW (ref 30.0–100.0)

## 2019-04-10 ENCOUNTER — Telehealth: Payer: Self-pay

## 2019-04-10 NOTE — Telephone Encounter (Signed)
Pt advised.   Thanks,   -Stefan Markarian  

## 2019-04-10 NOTE — Telephone Encounter (Signed)
-----   Message from Mar Daring, PA-C sent at 04/10/2019  7:25 AM EST ----- WBC count is borderline high. Any symptoms of upper respiratory or urinary infection present? Thyroid is normal. Cholesterol is doing good and stable. A1c is down to 5.6. Vit D is borderline low. Recommend OTC VIT D of 2000 IU daily.

## 2019-04-10 NOTE — Telephone Encounter (Signed)
Yes she seems to need both. Have heer do 1000IU ITC instead of 2000 though

## 2019-04-10 NOTE — Telephone Encounter (Signed)
Pt advised.  She does not have any symptoms of infection.  Pt also states she is taking Vitamin D 50,000 once a week, do you want her to take the OTC and RX together?   Thanks,   -Mickel Baas

## 2019-04-17 ENCOUNTER — Other Ambulatory Visit: Payer: Self-pay | Admitting: Physician Assistant

## 2019-04-17 DIAGNOSIS — M1991 Primary osteoarthritis, unspecified site: Secondary | ICD-10-CM

## 2019-04-24 NOTE — Progress Notes (Signed)
Patient: Robin Espinoza, Female    DOB: 22-Dec-1945, 74 y.o.   MRN: [id number] Visit Date: 04/27/2019  Today's Provider: Mar Daring, PA-C   Chief Complaint  Patient presents with  . Annual Exam   Subjective:     Complete Physical Robin Espinoza is a 74 y.o. female. She feels well. She reports exercising none. She reports she is sleeping fairly well. 04/06/2017 Mammogram-BI-RADS 1 08/01/2017 Osteopenia 03/20/2013 Colonoscopy-Normal repeat in 10 years; was done by Dr. Vira Agar. -----------------------------------------------------------   Review of Systems  Constitutional: Positive for fatigue.  HENT: Positive for tinnitus.   Eyes: Negative.   Respiratory: Negative.   Cardiovascular: Negative.   Gastrointestinal: Negative.   Endocrine: Negative.   Genitourinary: Positive for urgency.  Musculoskeletal: Positive for arthralgias, back pain and joint swelling.  Skin: Positive for rash.  Allergic/Immunologic: Negative.   Neurological: Negative.   Hematological: Negative.   Psychiatric/Behavioral: Negative.     Social History   Socioeconomic History  . Marital status: Married    Spouse name: Not on file  . Number of children: 3  . Years of education: Not on file  . Highest education level: Master's degree (e.g., MA, MS, MEng, MEd, MSW, MBA)  Occupational History  . Occupation: retired  Tobacco Use  . Smoking status: Former Research scientist (life sciences)  . Smokeless tobacco: Never Used  . Tobacco comment: in college  Substance and Sexual Activity  . Alcohol use: Yes    Comment: rarely 1 drink  . Drug use: No  . Sexual activity: Not on file  Other Topics Concern  . Not on file  Social History Narrative  . Not on file   Social Determinants of Health   Financial Resource Strain: Low Risk   . Difficulty of Paying Living Expenses: Not hard at all  Food Insecurity: No Food Insecurity  . Worried About Charity fundraiser in the Last Year: Never true  . Ran Out of Food in the  Last Year: Never true  Transportation Needs: No Transportation Needs  . Lack of Transportation (Medical): No  . Lack of Transportation (Non-Medical): No  Physical Activity: Inactive  . Days of Exercise per Week: 0 days  . Minutes of Exercise per Session: 0 min  Stress: Stress Concern Present  . Feeling of Stress : To some extent  Social Connections: Not Isolated  . Frequency of Communication with Friends and Family: More than three times a week  . Frequency of Social Gatherings with Friends and Family: More than three times a week  . Attends Religious Services: More than 4 times per year  . Active Member of Clubs or Organizations: Yes  . Attends Archivist Meetings: More than 4 times per year  . Marital Status: Married  Human resources officer Violence: Not At Risk  . Fear of Current or Ex-Partner: No  . Emotionally Abused: No  . Physically Abused: No  . Sexually Abused: No    Past Medical History:  Diagnosis Date  . Anxiety   . GERD (gastroesophageal reflux disease)   . Hyperlipidemia 12/21/2005  . Hypertension   . Insomnia   . Obstructive sleep apnea   . Osteoarthrosis   . Vitamin D deficiency      Patient Active Problem List   Diagnosis Date Noted  . BMI 40.0-44.9, adult ([place]) 04/15/2018  . Avitaminosis D 02/28/2015  . Hypothyroidism 11/13/2014  . Anxiety disorder 10/15/2014  . Insomnia 09/21/2014  . Acid reflux 11/30/2007  . Arthritis,  degenerative 09/16/2007  . Fam hx-ischem heart disease 02/12/2007  . Adiposity 02/09/2007  . Obstructive apnea 05/06/2006  . 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 BP (high blood pressure) 03/23/1998    Past Surgical History:  Procedure Laterality Date  . Cortisone injections in back    . TUBAL LIGATION  1981    Her family history includes Diabetes in her father.   Current Outpatient Medications:  .  amLODipine (NORVASC) 5 MG tablet, TAKE 1 TABLET BY MOUTH EVERY DAY, Disp: 90 tablet, Rfl:  2 .  clotrimazole-betamethasone (LOTRISONE) cream, Apply 1 application topically 2 (two) times daily. (Patient taking differently: Apply 1 application topically 2 (two) times daily. As needed), Disp: 30 g, Rfl: 0 .  etodolac (LODINE) 500 MG tablet, TAKE 1 TABLET BY MOUTH TWICE A DAY, Disp: 180 tablet, Rfl: 1 .  FLUZONE HIGH-DOSE 0.5 ML injection, TO BE ADMINISTERED BY PHARMACIST FOR IMMUNIZATION, Disp: , Rfl: 0 .  levothyroxine (SYNTHROID) 112 MCG tablet, TAKE 1 TABLET (112 MCG TOTAL) BY MOUTH DAILY BEFORE BREAKFAST., Disp: 90 tablet, Rfl: 1 .  lovastatin (MEVACOR) 40 MG tablet, TAKE 1 TABLET BY MOUTH AT BEDTIME, Disp: 90 tablet, Rfl: 3 .  metoprolol succinate (TOPROL-XL) 100 MG 24 hr tablet, TAKE 1 TABLET (100 MG TOTAL) BY MOUTH DAILY. TAKE WITH OR IMMEDIATELY FOLLOWING A MEAL., Disp: 90 tablet, Rfl: 3 .  Multiple Vitamin (MULTIVITAMIN) capsule, Take 1 capsule by mouth daily., Disp: , Rfl:  .  NON FORMULARY, CPAP, Disp: , Rfl:  .  omeprazole (PRILOSEC) 20 MG capsule, TAKE 1 CAPSULE BY MOUTH EVERY DAY, Disp: 90 capsule, Rfl: 3 .  oxybutynin (DITROPAN-XL) 10 MG 24 hr tablet, Take 1 tablet (10 mg total) by mouth at bedtime., Disp: 90 tablet, Rfl: 0 .  phentermine (ADIPEX-P) 37.5 MG tablet, Take 1 tablet (37.5 mg total) by mouth daily before breakfast., Disp: 30 tablet, Rfl: 2 .  traZODone (DESYREL) 50 MG tablet, Take 2 tablets (100 mg total) by mouth daily., Disp: 180 tablet, Rfl: 1 .  venlafaxine XR (EFFEXOR-XR) 75 MG 24 hr capsule, TAKE 2 CAPSULES (150 MG TOTAL) BY MOUTH DAILY WITH BREAKFAST., Disp: 180 capsule, Rfl: 1 .  Vitamin D, Ergocalciferol, (DRISDOL) 1.25 MG (50000 UT) CAPS capsule, TAKE 1 CAPSULE (50,000 UNITS TOTAL) BY MOUTH EVERY 7 (SEVEN) DAYS., Disp: 12 capsule, Rfl: 1  Patient Care Team: Mar Daring, PA-C as PCP - General (Family Medicine) Birder Robson, MD as Referring Physician (Ophthalmology) Genice Rouge, PA-C as Physician Assistant Thornton Park, MD as  Referring Physician (Orthopedic Surgery)     Objective:    Vitals: BP 134/86 (BP Location: Left Arm, Patient Position: Sitting, Cuff Size: Large)   Pulse 71   Temp (!) 97.1 F (36.2 C) (Temporal)   Resp 16   Ht 5' 5.5" (1.664 m)   Wt 275 lb 3.2 oz (124.8 kg)   BMI 45.10 kg/m   Physical Exam Vitals reviewed.  Constitutional:      General: She is not in acute distress.    Appearance: Normal appearance. She is well-developed. She is obese. She is not ill-appearing or diaphoretic.  HENT:     Head: Normocephalic and atraumatic.     Right Ear: Tympanic membrane, ear canal and external ear normal.     Left Ear: Tympanic membrane, ear canal and external ear normal.  Eyes:     General: No scleral icterus.       Right eye: No discharge.  Left eye: No discharge.     Extraocular Movements: Extraocular movements intact.     Conjunctiva/sclera: Conjunctivae normal.     Pupils: Pupils are equal, round, and reactive to light.  Neck:     Thyroid: No thyromegaly.     Vascular: No carotid bruit or JVD.     Trachea: No tracheal deviation.  Cardiovascular:     Rate and Rhythm: Normal rate and regular rhythm.     Pulses: Normal pulses.     Heart sounds: Normal heart sounds. No murmur. No friction rub. No gallop.   Pulmonary:     Effort: Pulmonary effort is normal. No respiratory distress.     Breath sounds: Normal breath sounds. No wheezing or rales.  Chest:     Chest wall: No tenderness.  Abdominal:     General: Abdomen is protuberant. Bowel sounds are normal. There is no distension.     Palpations: Abdomen is soft. There is no mass.     Tenderness: There is no abdominal tenderness. There is no guarding or rebound.  Musculoskeletal:        General: No tenderness. Normal range of motion.     Cervical back: Normal range of motion and neck supple.  Lymphadenopathy:     Cervical: No cervical adenopathy.  Skin:    General: Skin is warm and dry.     Capillary Refill: Capillary  refill takes less than 2 seconds.     Findings: No rash.  Neurological:     General: No focal deficit present.     Mental Status: She is alert and oriented to person, place, and time. Mental status is at baseline.  Psychiatric:        Mood and Affect: Mood normal.        Behavior: Behavior normal.        Thought Content: Thought content normal.        Judgment: Judgment normal.     Activities of Daily Living In your present state of health, do you have any difficulty performing the following activities: 04/03/2019  Hearing? N  Vision? N  Difficulty concentrating or making decisions? N  Walking or climbing stairs? Y  Comment Due to knee pains.  Dressing or bathing? N  Doing errands, shopping? N  Preparing Food and eating ? N  Using the Toilet? N  In the past six months, have you accidently leaked urine? Y  Comment Currently on treatment for leakage.  Do you have problems with loss of bowel control? N  Managing your Medications? N  Managing your Finances? N  Housekeeping or managing your Housekeeping? Y  Comment Has assistance cleaning home.  Some recent data might be hidden    Fall Risk Assessment Fall Risk  04/03/2019 03/28/2018 03/08/2017 03/10/2016 03/04/2015  Falls in the past year? 1 0 No No No  Number falls in past yr: 1 0 - - -  Injury with Fall? 0 0 - - -  Risk for fall due to : Other (Comment);Impaired mobility - - - -  Risk for fall due to: Comment due to knees buckling from arthritis - - - -     Depression Screen PHQ 2/9 Scores 04/27/2019 04/03/2019 04/03/2019 04/03/2019  PHQ - 2 Score 0 1 1 0  PHQ- 9 Score 4 - - -    6CIT Screen 04/27/2019  What Year? 0 points  What month? 0 points  What time? 0 points  Count back from 20 0 points  Months in reverse 0  points  Repeat phrase 0 points  Total Score 0   Audit-C Alcohol Use Screening   Alcohol Use Disorder Test (AUDIT) 04/03/2019  1. How often do you have a drink containing alcohol? 1  2. How many drinks  containing alcohol do you have on a typical day when you are drinking? 0  3. How often do you have six or more drinks on one occasion? 0  AUDIT-C Score 1    A score of 3 or more in women, and 4 or more in men indicates increased risk for alcohol abuse, EXCEPT if all of the points are from question 1     Assessment & Plan:    Annual Physical Reviewed patient's Family Medical History Reviewed and updated list of patient's medical providers Assessment of cognitive impairment was done Assessed patient's functional ability Established a written schedule for health screening [place] Completed and Reviewed  Exercise Activities and Dietary recommendations Goals    . DIET - REDUCE PORTION SIZE     Recommend cutting portion sizes in half and eating 3 Ebrahim meals a day with 2 healthy snack a day.        Immunization History  Administered Date(s) Administered  . Influenza, High Dose Seasonal PF 12/03/2014, 01/28/2018  . Influenza-Unspecified 01/11/2016, 01/19/2017, 01/06/2019  . Pneumococcal Conjugate-13 03/08/2017  . Pneumococcal Polysaccharide-23 03/20/2011  . Td 03/10/2004    Health Maintenance  Topic Date Due  . TETANUS/TDAP  03/10/2014  . DEXA SCAN  08/01/2017  . MAMMOGRAM  04/07/2019  . COLONOSCOPY  03/21/2023  . INFLUENZA VACCINE  Completed  . Hepatitis C Screening  Completed  . PNA vac Low Risk Adult  Completed     Discussed health benefits of physical activity, and encouraged her to engage in regular exercise appropriate for her age and condition.    1. Annual physical exam Normal exam. Mammogram and BMD have been ordered. Otherwise up to date on all other screenings and vaccinations.   2. Encounter for breast cancer screening using non-mammogram modality Patient advised to continue self breast exams. Mammogram ordered.   3. Class 3 severe obesity due to excess calories with serious comorbidity and body mass index (BMI) of 45.0 to 49.9 in  adult [facility]) Doing well. Has lost 10 pounds since 04/03/19 on phentermine. She is tolerating well.   ------------------------------------------------------------------------------------------------------------    Mar Daring, PA-C  [place]

## 2019-04-27 ENCOUNTER — Ambulatory Visit (INDEPENDENT_AMBULATORY_CARE_PROVIDER_SITE_OTHER): Payer: PPO | Admitting: Physician Assistant

## 2019-04-27 ENCOUNTER — Encounter: Payer: Self-pay | Admitting: Physician Assistant

## 2019-04-27 ENCOUNTER — Other Ambulatory Visit: Payer: Self-pay

## 2019-04-27 VITALS — BP 134/86 | HR 71 | Temp 97.1°F | Resp 16 | Ht 65.5 in | Wt 275.2 lb

## 2019-04-27 DIAGNOSIS — Z0000 Encounter for general adult medical examination without abnormal findings: Secondary | ICD-10-CM | POA: Diagnosis not present

## 2019-04-27 DIAGNOSIS — Z1239 Encounter for other screening for malignant neoplasm of breast: Secondary | ICD-10-CM

## 2019-04-27 DIAGNOSIS — Z6841 Body Mass Index (BMI) 40.0 and over, adult: Secondary | ICD-10-CM

## 2019-04-27 NOTE — Patient Instructions (Signed)
Can take to lessen severity: Vit C 500mg twice daily Quercertin 250-500mg twice daily Zinc 75-100mg daily Melatonin 3-6 mg at bedtime Vit D3 1000-2000 IU daily Aspirin 81 mg daily with food Optional: Famotidine 20mg daily Also can add tylenol/ibuprofen as needed for fevers and body aches May add Mucinex or Mucinex DM as needed for cough/congestion  

## 2019-05-02 ENCOUNTER — Encounter: Payer: Self-pay | Admitting: Physician Assistant

## 2019-05-04 ENCOUNTER — Other Ambulatory Visit: Payer: Self-pay | Admitting: Physician Assistant

## 2019-05-04 DIAGNOSIS — Z1231 Encounter for screening mammogram for malignant neoplasm of breast: Secondary | ICD-10-CM

## 2019-05-12 ENCOUNTER — Other Ambulatory Visit: Payer: Self-pay | Admitting: Physician Assistant

## 2019-05-12 DIAGNOSIS — E039 Hypothyroidism, unspecified: Secondary | ICD-10-CM

## 2019-05-12 DIAGNOSIS — I10 Essential (primary) hypertension: Secondary | ICD-10-CM

## 2019-05-15 ENCOUNTER — Telehealth: Payer: Self-pay

## 2019-05-15 NOTE — Telephone Encounter (Signed)
Has she tried Miralax?   If not she should take 1-2 capfuls daily until soft BM achieved. May also take OTC stool softener with miralax as well.

## 2019-05-15 NOTE — Telephone Encounter (Signed)
Copied from [place] [id number]. Topic: General - Other >> May 15, 2019 10:51 AM Leward Quan A wrote: Reason for CRM: Patient called to inform [name] that she is having terrible constipation and is in need of something strong sent to the pharmacy to help her get some relief. States that it is also causing her to have flare up of hemorrhoids.  Patient can be reached at Ph# [phone] [facility] #[id number] Lorina Rabon, [place]  Phone:  [phone] Fax:  [phone]

## 2019-05-15 NOTE — Telephone Encounter (Signed)
[id number] 

## 2019-05-16 NOTE — Telephone Encounter (Signed)
Patient called back and was informed.

## 2019-05-21 ENCOUNTER — Other Ambulatory Visit: Payer: Self-pay | Admitting: Physician Assistant

## 2019-05-21 DIAGNOSIS — F329 Major depressive disorder, single episode, unspecified: Secondary | ICD-10-CM

## 2019-05-21 DIAGNOSIS — F32A Depression, unspecified: Secondary | ICD-10-CM

## 2019-05-22 ENCOUNTER — Other Ambulatory Visit: Payer: Self-pay | Admitting: Physician Assistant

## 2019-05-22 DIAGNOSIS — M170 Bilateral primary osteoarthritis of knee: Secondary | ICD-10-CM | POA: Diagnosis not present

## 2019-05-22 DIAGNOSIS — N3281 Overactive bladder: Secondary | ICD-10-CM

## 2019-05-22 DIAGNOSIS — N3941 Urge incontinence: Secondary | ICD-10-CM

## 2019-05-22 NOTE — Telephone Encounter (Signed)
Requested Prescriptions  Pending Prescriptions Disp Refills  . oxybutynin (DITROPAN-XL) 10 MG 24 hr tablet [Pharmacy Med Name: OXYBUTYNIN CL ER 10 MG TABLET] 90 tablet 0    Sig: TAKE 1 TABLET BY MOUTH EVERYDAY AT BEDTIME     Urology:  Bladder Agents Passed - 05/22/2019 12:57 AM      Passed - Valid encounter within last 12 months    Recent Outpatient Visits          3 weeks ago Annual physical exam   [facility] [place], [place] M, PA-C   1 month ago Class 3 severe obesity due to excess calories with serious comorbidity and body mass index (BMI) of 40.0 to 44.9 in adult [facility])   [place], [place]   2 months ago OAB (overactive bladder)   [place], [place]   6 months ago Bilateral impacted cerumen   [place], [place]   7 months ago Acute left ankle pain   [facility] [place], [place], [place]

## 2019-05-30 DIAGNOSIS — M170 Bilateral primary osteoarthritis of knee: Secondary | ICD-10-CM | POA: Diagnosis not present

## 2019-06-06 DIAGNOSIS — M170 Bilateral primary osteoarthritis of knee: Secondary | ICD-10-CM | POA: Diagnosis not present

## 2019-06-18 ENCOUNTER — Other Ambulatory Visit: Payer: Self-pay | Admitting: Physician Assistant

## 2019-06-18 DIAGNOSIS — I10 Essential (primary) hypertension: Secondary | ICD-10-CM

## 2019-06-18 NOTE — Telephone Encounter (Signed)
Requested Prescriptions  Pending Prescriptions Disp Refills  . metoprolol succinate (TOPROL-XL) 100 MG 24 hr tablet [Pharmacy Med Name: METOPROLOL SUCC ER 100 MG TAB] 90 tablet 3    Sig: TAKE 1 TABLET (100 MG TOTAL) BY MOUTH DAILY. TAKE WITH OR IMMEDIATELY FOLLOWING A MEAL.     Cardiovascular:  Beta Blockers Passed - 06/18/2019  1:44 PM      Passed - Last BP in normal range    BP Readings from Last 1 Encounters:  04/27/19 134/86         Passed - Last Heart Rate in normal range    Pulse Readings from Last 1 Encounters:  04/27/19 71         Passed - Valid encounter within last 6 months    Recent Outpatient Visits          1 month ago Annual physical exam   [facility] [name] M, PA-C   2 months ago Class 3 severe obesity due to excess calories with serious comorbidity and body mass index (BMI) of 40.0 to 44.9 in adult [facility])   [place], [place]   3 months ago OAB (overactive bladder)   [place], [place]   7 months ago Bilateral impacted cerumen   [place], [place]   8 months ago Acute left ankle pain   [facility] [place], [place], [place]

## 2019-06-22 ENCOUNTER — Encounter: Payer: Self-pay | Admitting: Physician Assistant

## 2019-06-22 ENCOUNTER — Ambulatory Visit (INDEPENDENT_AMBULATORY_CARE_PROVIDER_SITE_OTHER): Payer: PPO | Admitting: Physician Assistant

## 2019-06-22 ENCOUNTER — Other Ambulatory Visit: Payer: Self-pay

## 2019-06-22 VITALS — BP 143/90 | HR 90 | Temp 96.6°F | Wt 263.2 lb

## 2019-06-22 DIAGNOSIS — H6123 Impacted cerumen, bilateral: Secondary | ICD-10-CM | POA: Diagnosis not present

## 2019-06-22 NOTE — Progress Notes (Signed)
Patient: Robin Espinoza Female    DOB: [birth year]   74 y.o.   MRN: [id number] Visit Date: 06/22/2019  Today's Provider: Mar Daring, PA-C   Chief Complaint  Patient presents with  . Cerumen Impaction   Subjective:     Patient here with c/o wanting ear wax removed. Patient states that ears are stopped up and popping. Patient wakes up in the morning and her ears are stopped up. She also has ringing in her ears and both ears will be cleaned today.    Wt Readings from Last 3 Encounters:  06/22/19 263 lb 3.2 oz (119.4 kg)  04/27/19 275 lb 3.2 oz (124.8 kg)  03/28/18 283 lb 9.6 oz (128.6 kg)    No Known Allergies   Current Outpatient Medications:  .  amLODipine (NORVASC) 5 MG tablet, TAKE 1 TABLET BY MOUTH EVERY DAY, Disp: 90 tablet, Rfl: 2 .  clotrimazole-betamethasone (LOTRISONE) cream, Apply 1 application topically 2 (two) times daily. (Patient taking differently: Apply 1 application topically 2 (two) times daily. As needed), Disp: 30 g, Rfl: 0 .  etodolac (LODINE) 500 MG tablet, TAKE 1 TABLET BY MOUTH TWICE A DAY, Disp: 180 tablet, Rfl: 1 .  levothyroxine (SYNTHROID) 112 MCG tablet, TAKE 1 TABLET (112 MCG TOTAL) BY MOUTH DAILY BEFORE BREAKFAST., Disp: 90 tablet, Rfl: 1 .  lovastatin (MEVACOR) 40 MG tablet, TAKE 1 TABLET BY MOUTH EVERYDAY AT BEDTIME, Disp: 90 tablet, Rfl: 3 .  metoprolol succinate (TOPROL-XL) 100 MG 24 hr tablet, TAKE 1 TABLET (100 MG TOTAL) BY MOUTH DAILY. TAKE WITH OR IMMEDIATELY FOLLOWING A MEAL., Disp: 90 tablet, Rfl: 3 .  Multiple Vitamin (MULTIVITAMIN) capsule, Take 1 capsule by mouth daily., Disp: , Rfl:  .  NON FORMULARY, CPAP, Disp: , Rfl:  .  omeprazole (PRILOSEC) 20 MG capsule, TAKE 1 CAPSULE BY MOUTH EVERY DAY, Disp: 90 capsule, Rfl: 3 .  oxybutynin (DITROPAN-XL) 10 MG 24 hr tablet, TAKE 1 TABLET BY MOUTH EVERYDAY AT BEDTIME, Disp: 90 tablet, Rfl: 0 .  traZODone (DESYREL) 50 MG tablet, Take 2 tablets (100 mg total) by mouth daily., Disp:  180 tablet, Rfl: 1 .  venlafaxine XR (EFFEXOR-XR) 75 MG 24 hr capsule, TAKE 2 CAPSULES (150 MG TOTAL) BY MOUTH DAILY WITH BREAKFAST., Disp: 180 capsule, Rfl: 1 .  Vitamin D, Ergocalciferol, (DRISDOL) 1.25 MG (50000 UT) CAPS capsule, TAKE 1 CAPSULE (50,000 UNITS TOTAL) BY MOUTH EVERY 7 (SEVEN) DAYS., Disp: 12 capsule, Rfl: 1 .  FLUZONE HIGH-DOSE 0.5 ML injection, TO BE ADMINISTERED BY PHARMACIST FOR IMMUNIZATION, Disp: , Rfl: 0 .  phentermine (ADIPEX-P) 37.5 MG tablet, Take 1 tablet (37.5 mg total) by mouth daily before breakfast. (Patient not taking: Reported on 06/22/2019), Disp: 30 tablet, Rfl: 2  Review of Systems  Constitutional: Negative.   HENT: Positive for ear pain, hearing loss and tinnitus.   Respiratory: Negative.   Cardiovascular: Negative.   Neurological: Negative for dizziness and headaches.    Social History   Tobacco Use  . Smoking status: Former Research scientist (life sciences)  . Smokeless tobacco: Never Used  . Tobacco comment: in college  Substance Use Topics  . Alcohol use: Yes    Comment: rarely 1 drink      Objective:   BP (!) 143/90 (BP Location: Left Arm, Patient Position: Sitting, Cuff Size: Large)   Pulse 90   Temp (!) 96.6 F (35.9 C) (Temporal)   Wt 263 lb 3.2 oz (119.4 kg)   SpO2 99%  BMI 43.13 kg/m  Vitals:   06/22/19 1713  BP: (!) 143/90  Pulse: 90  Temp: (!) 96.6 F (35.9 C)  TempSrc: Temporal  SpO2: 99%  Weight: 263 lb 3.2 oz (119.4 kg)  Body mass index is 43.13 kg/m.   Physical Exam Vitals reviewed.  Constitutional:      Appearance: Normal appearance. She is obese.  HENT:     Head: Normocephalic and atraumatic.     Right Ear: There is impacted cerumen.     Left Ear: There is impacted cerumen.  Neurological:     Mental Status: She is alert.     No results found for any visits on 06/22/19.     Assessment & Plan    1. Bilateral impacted cerumen Ear lavage successful bilateral. Provider was not required for appointment but was available for  questions if any arose.  - Ear Lavage     Mar Daring, PA-C  [facility]

## 2019-06-23 ENCOUNTER — Other Ambulatory Visit: Payer: Self-pay | Admitting: Physician Assistant

## 2019-06-23 DIAGNOSIS — K219 Gastro-esophageal reflux disease without esophagitis: Secondary | ICD-10-CM

## 2019-07-03 ENCOUNTER — Other Ambulatory Visit: Payer: PPO

## 2019-07-13 ENCOUNTER — Ambulatory Visit
Admission: RE | Admit: 2019-07-13 | Discharge: 2019-07-13 | Disposition: A | Discharge status: Home / Self Care | Payer: PPO | Source: Ambulatory Visit | Attending: Physician Assistant | Admitting: Physician Assistant

## 2019-07-13 DIAGNOSIS — E2839 Other primary ovarian failure: Secondary | ICD-10-CM

## 2019-07-13 DIAGNOSIS — M85852 Other specified disorders of bone density and structure, left thigh: Secondary | ICD-10-CM | POA: Diagnosis not present

## 2019-07-13 DIAGNOSIS — Z780 Asymptomatic menopausal state: Secondary | ICD-10-CM | POA: Diagnosis not present

## 2019-07-13 DIAGNOSIS — Z1231 Encounter for screening mammogram for malignant neoplasm of breast: Secondary | ICD-10-CM | POA: Insufficient documentation

## 2019-07-13 IMAGING — MG DIGITAL SCREENING BILAT W/ TOMO W/ CAD
8 series · 8 of 24 positions shown · non-contrast
Comparison: Previous exam(s).

CLINICAL DATA: Screening.

EXAM:
DIGITAL SCREENING BILATERAL MAMMOGRAM WITH TOMO AND CAD

[R MLO synth-2D]
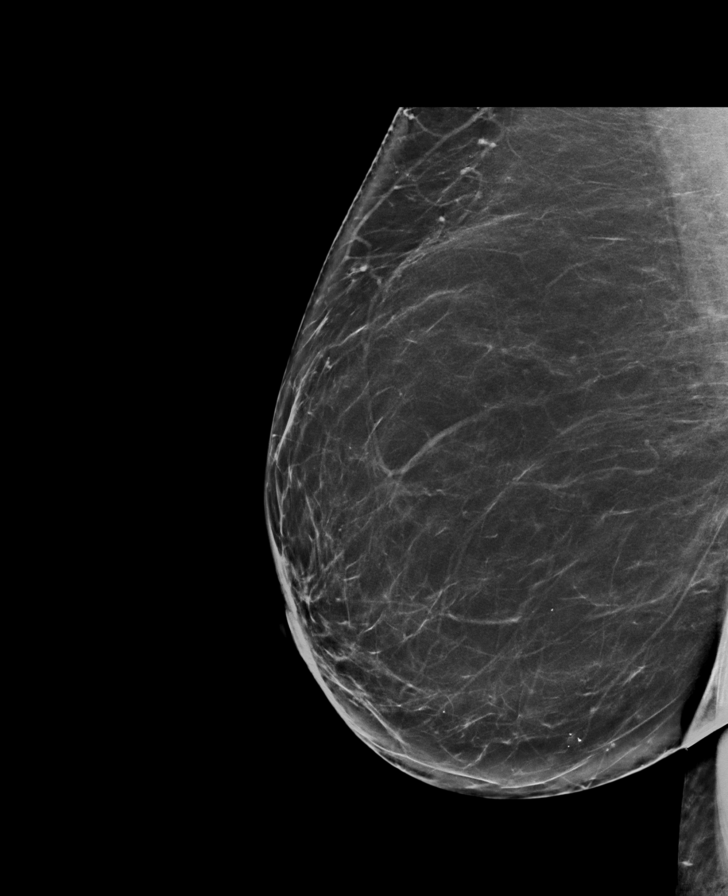

[L CC synth-2D]
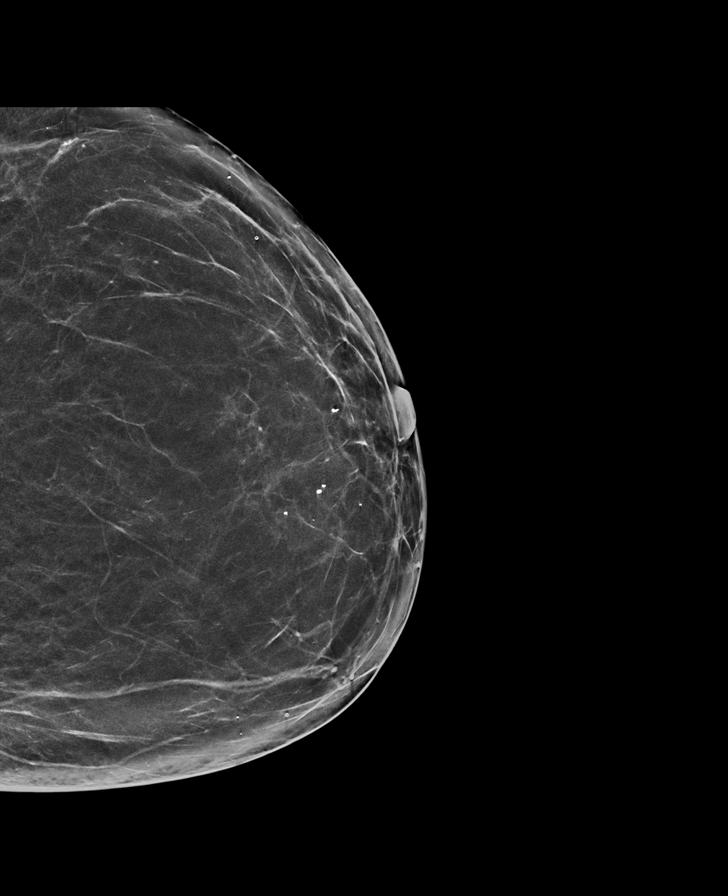

[L MLO synth-2D]
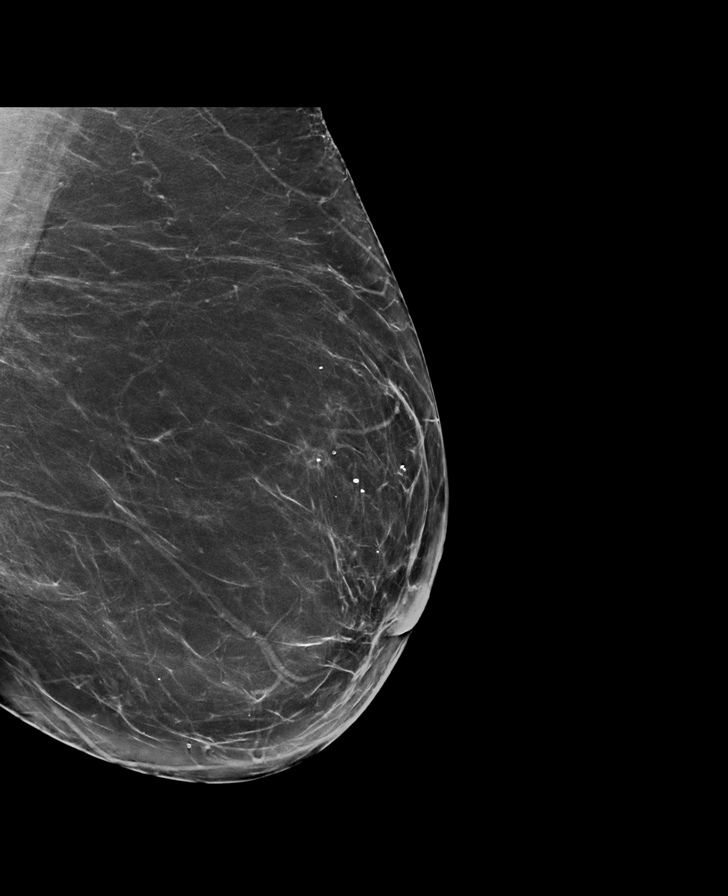

[R CC synth-2D]
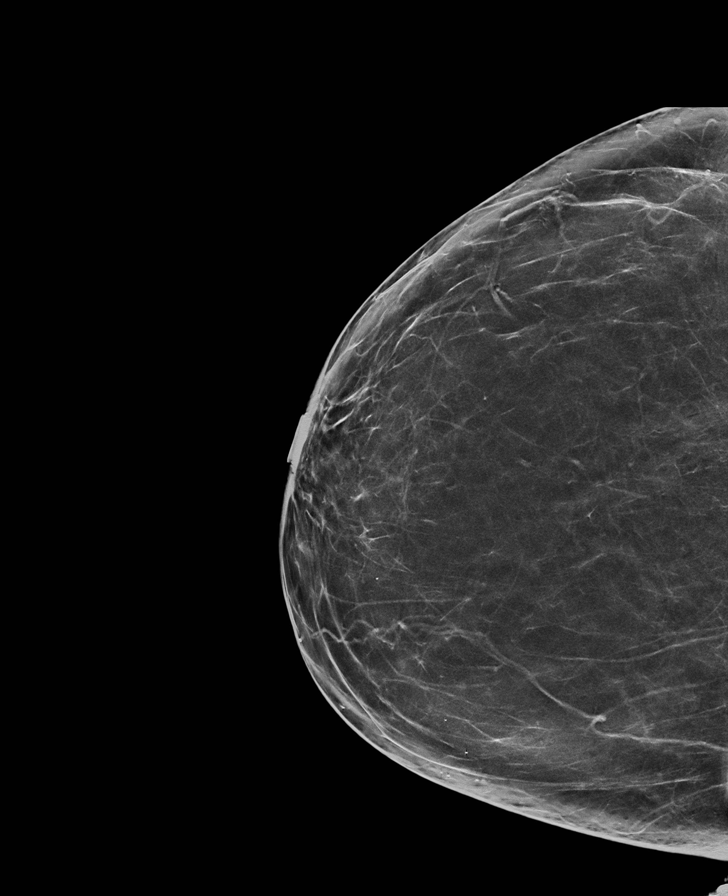

[L CC tomo · tomo slice 35/68.0]
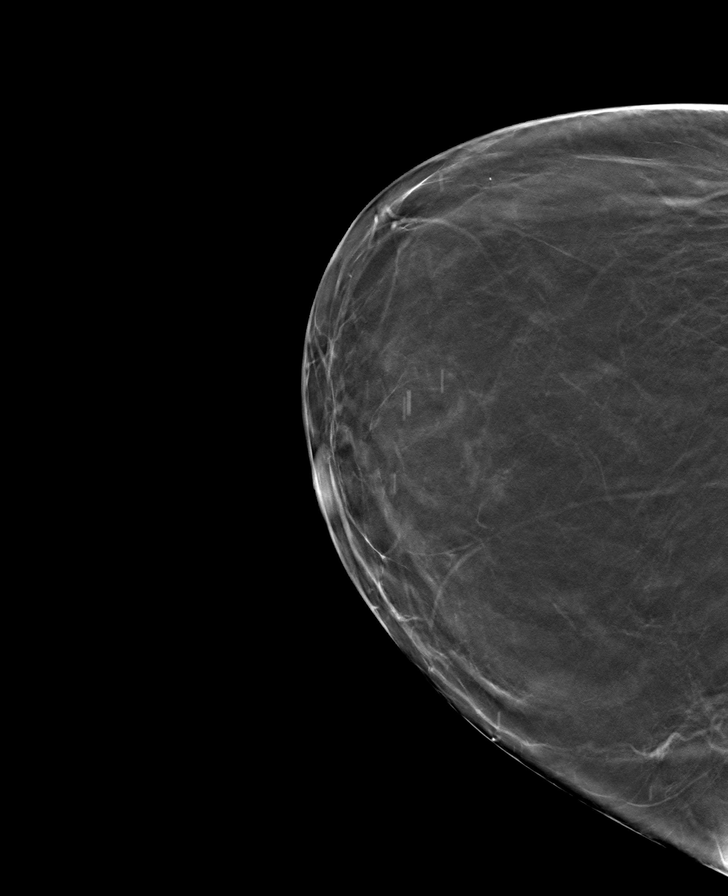

[R MLO tomo · tomo slice 42/83.0]
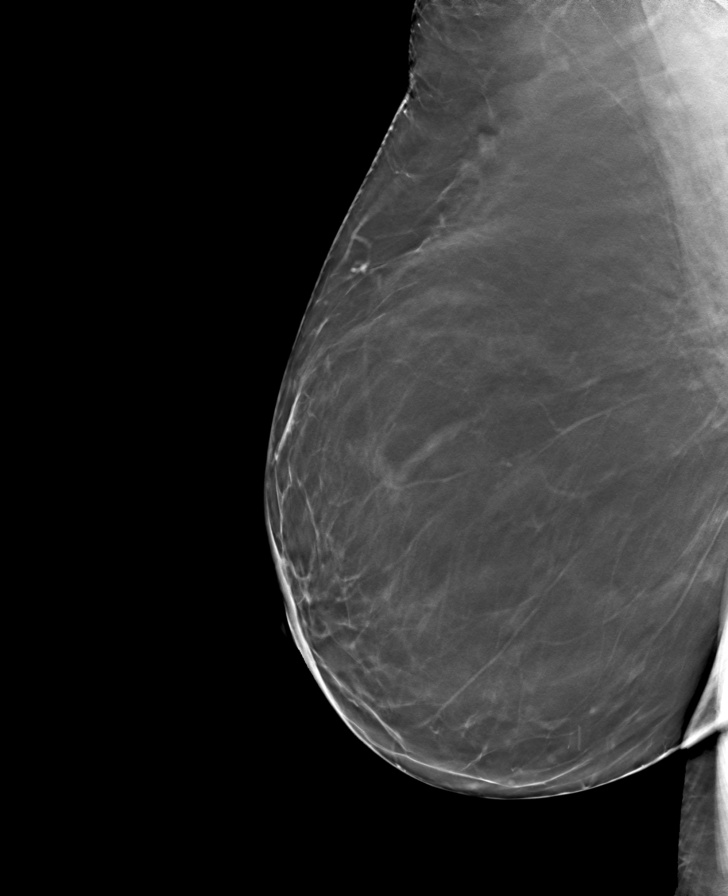

[L MLO tomo · tomo slice 41/81.0]
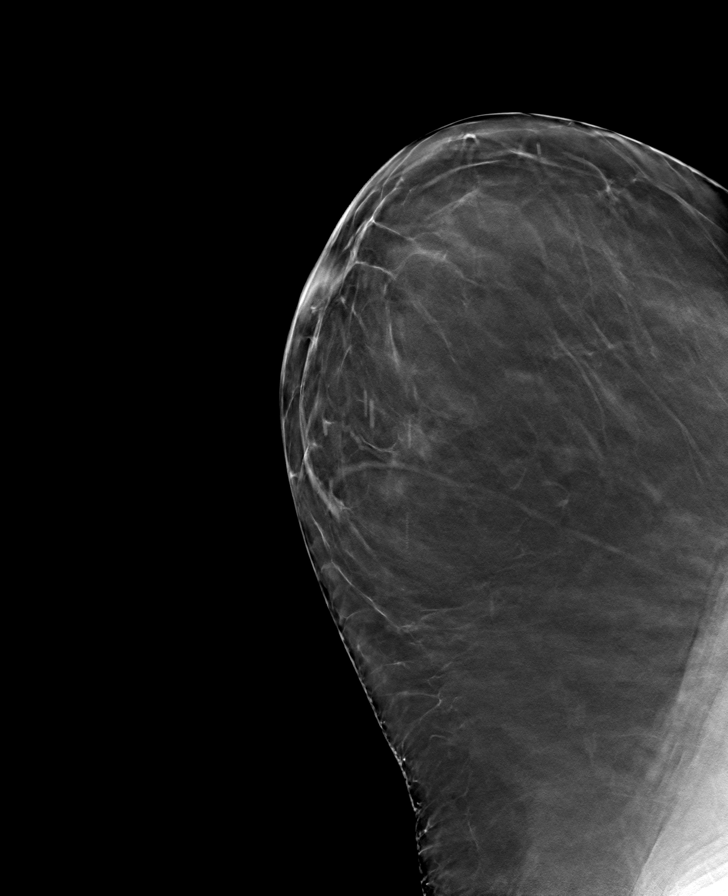

[R CC tomo · tomo slice 37/74.0]
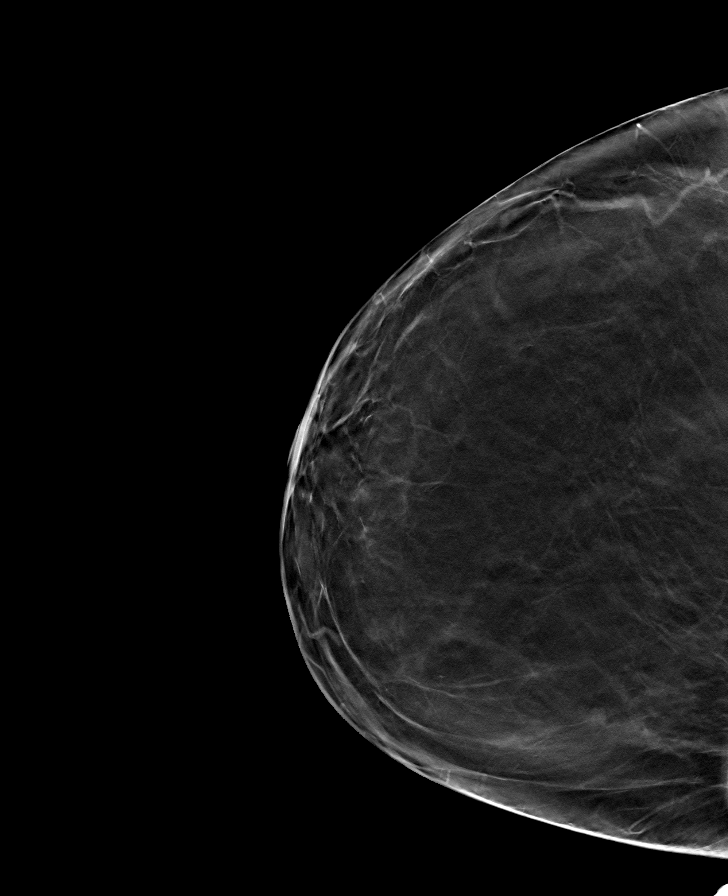

[8 of 24 positions shown; findings below may reference images not displayed]

ACR Breast Density Category b: There are scattered areas of
fibroglandular density.
FINDINGS: There are no findings suspicious for malignancy. Images were
processed with CAD.
IMPRESSION: No mammographic evidence of malignancy. A result letter of this
screening mammogram will be mailed directly to the patient.

RECOMMENDATION:
Screening mammogram in one year. (Code:[TQ])

BI-RADS CATEGORY  1: Negative.

## 2019-07-14 ENCOUNTER — Telehealth: Payer: Self-pay

## 2019-07-14 NOTE — Telephone Encounter (Signed)
-----   Message from Mar Daring, [place] sent at 07/14/2019  7:46 AM EDT ----- Bone density did decrease ever so slightly over the last 7 years. Still only osteopenic, have not progressed. Can repeat BMD, if desired, in 5 years.

## 2019-07-14 NOTE — Telephone Encounter (Signed)
Copied from [place] [id number]. Topic: General - Inquiry >> Jul 14, 2019 11:23 AM Richardo Priest, NT wrote: Reason for CRM: Patient called in stating she would like someone to call her and go over the bone density test results with her. Please advise.

## 2019-07-14 NOTE — Telephone Encounter (Signed)
LM to call back or to view bone density results in [place] and providers message. If patient calls back OK for the [facility] to give results.

## 2019-07-17 NOTE — Telephone Encounter (Signed)
LMTCB if patient calls OK for the [facility] nurse to give Results of the bone density

## 2019-07-20 NOTE — Telephone Encounter (Signed)
Attempted to call pt.  Left vm to return call to office to get results of Bone Density test.

## 2019-07-20 NOTE — Telephone Encounter (Signed)
LMTCB if patient calls back OK for [facility]/nurse to give bone density results/

## 2019-07-21 NOTE — Telephone Encounter (Signed)
Bone density did decrease ever so slightly over the last 7 years. Still only osteopenic, have not progressed. Can repeat BMD, if desired, in 5 years.  Written by Mar Daring, PA-C on 07/14/2019 7:46 AM EDT Seen by patient Robin Espinoza on 07/14/2019 11:17 AM EDT

## 2019-08-04 DIAGNOSIS — M25562 Pain in left knee: Secondary | ICD-10-CM | POA: Diagnosis not present

## 2019-08-04 DIAGNOSIS — M1711 Unilateral primary osteoarthritis, right knee: Secondary | ICD-10-CM | POA: Diagnosis not present

## 2019-08-04 DIAGNOSIS — M25561 Pain in right knee: Secondary | ICD-10-CM | POA: Diagnosis not present

## 2019-08-07 ENCOUNTER — Ambulatory Visit: Payer: Self-pay

## 2019-08-07 NOTE — Telephone Encounter (Signed)
Pt. Reports she had had swelling to both feet and ankles "for awhile, probably months." States she is having a total knee replacement in July and her orthopedic doctor recommended having her swelling addressed with her primary care office. Denies any pain, no chest pain or shortness of breath. Skin is "a little pink." No fever. Spoke with Jiles Garter in the practice and appointment made.   Reason for Disposition . [1] MILD swelling of both ankles (i.e., pedal edema) AND [2] new onset or worsening  Answer Assessment - Initial Assessment Questions 1. ONSET: "When did the swelling start?" (e.g., minutes, hours, days)     Started several months ago 2. LOCATION: "What part of the leg is swollen?"  "Are both legs swollen or just one leg?"     Both feet 3. SEVERITY: "How bad is the swelling?" (e.g., localized; mild, moderate, severe)  - Localized - [place] area of swelling localized to one leg  - MILD pedal edema - swelling limited to foot and ankle, pitting edema < 1/4 inch (6 mm) deep, rest and elevation eliminate most or all swelling  - MODERATE edema - swelling of lower leg to knee, pitting edema > 1/4 inch (6 mm) deep, rest and elevation only partially reduce swelling  - SEVERE edema - swelling extends above knee, facial or hand swelling present      Both feet and ankles 4. REDNESS: "Does the swelling look red or infected?"     Pink 5. PAIN: "Is the swelling painful to touch?" If so, ask: "How painful is it?"   (Scale 1-10; mild, moderate or severe)     No 6. FEVER: "Do you have a fever?" If so, ask: "What is it, how was it measured, and when did it start?"      No 7. CAUSE: "What do you think is causing the leg swelling?"     Unsure 8. MEDICAL HISTORY: "Do you have a history of heart failure, kidney disease, liver failure, or cancer?"     No 9. RECURRENT SYMPTOM: "Have you had leg swelling before?" If so, ask: "When was the last time?" "What happened that time?"     Yes 10. OTHER SYMPTOMS: "Do you  have any other symptoms?" (e.g., chest pain, difficulty breathing)       No 11. PREGNANCY: "Is there any chance you are pregnant?" "When was your last menstrual period?"       No  Protocols used: LEG SWELLING AND EDEMA-A-AH

## 2019-08-08 ENCOUNTER — Ambulatory Visit (INDEPENDENT_AMBULATORY_CARE_PROVIDER_SITE_OTHER): Payer: PPO | Admitting: Physician Assistant

## 2019-08-08 ENCOUNTER — Other Ambulatory Visit: Payer: Self-pay

## 2019-08-08 ENCOUNTER — Encounter: Payer: Self-pay | Admitting: Physician Assistant

## 2019-08-08 VITALS — BP 134/74 | HR 72 | Temp 96.8°F | Wt 254.0 lb

## 2019-08-08 DIAGNOSIS — N3941 Urge incontinence: Secondary | ICD-10-CM

## 2019-08-08 DIAGNOSIS — M7989 Other specified soft tissue disorders: Secondary | ICD-10-CM | POA: Diagnosis not present

## 2019-08-08 DIAGNOSIS — N3281 Overactive bladder: Secondary | ICD-10-CM | POA: Diagnosis not present

## 2019-08-08 MED ORDER — OXYBUTYNIN CHLORIDE ER 10 MG PO TB24: ORAL_TABLET | ORAL | 0 refills | Status: DC

## 2019-08-08 NOTE — Patient Instructions (Addendum)
Stop taking Amlodipine.

## 2019-08-08 NOTE — Progress Notes (Signed)
Established patient visit   Patient: Robin Espinoza   DOB: 1945/05/27   74 y.o. Female  MRN: [id number] Visit Date: 08/08/2019  Today's healthcare provider: Trinna Post, PA-C   Chief Complaint  Patient presents with  . Foot Swelling  I,Porsha C McClurkin,acting as a scribe for Trinna Post, PA-C.,have documented all relevant documentation on the behalf of Trinna Post, PA-C,as directed by  Trinna Post, PA-C while in the presence of Trinna Post, PA-C.  Subjective    HPI  Foot and Ankle Swelling  Patient states she had swelling in bilateral ankle/ foot for 2 months now. Patient states she has total knee replacement surgery on 09/26/2019 at [facility] and was wanting to have her ankles/foot looked at before surgery. Patient denies SOB, chest pain, irregular heartbeats. Patient reports father passed due to a heart attack. She is currently on amlodipine and metoprolol for HTN.      Medications: Outpatient Medications Prior to Visit  Medication Sig  . amLODipine (NORVASC) 5 MG tablet TAKE 1 TABLET BY MOUTH EVERY DAY  . clotrimazole-betamethasone (LOTRISONE) cream Apply 1 application topically 2 (two) times daily. (Patient taking differently: Apply 1 application topically 2 (two) times daily. As needed)  . etodolac (LODINE) 500 MG tablet TAKE 1 TABLET BY MOUTH TWICE A DAY  . FLUZONE HIGH-DOSE 0.5 ML injection TO BE ADMINISTERED BY PHARMACIST FOR IMMUNIZATION  . levothyroxine (SYNTHROID) 112 MCG tablet TAKE 1 TABLET (112 MCG TOTAL) BY MOUTH DAILY BEFORE BREAKFAST.  Marland Kitchen lovastatin (MEVACOR) 40 MG tablet TAKE 1 TABLET BY MOUTH EVERYDAY AT BEDTIME  . metoprolol succinate (TOPROL-XL) 100 MG 24 hr tablet TAKE 1 TABLET (100 MG TOTAL) BY MOUTH DAILY. TAKE WITH OR IMMEDIATELY FOLLOWING A MEAL.  . Multiple Vitamin (MULTIVITAMIN) capsule Take 1 capsule by mouth daily.  . NON FORMULARY CPAP  . omeprazole (PRILOSEC) 20 MG capsule TAKE 1 CAPSULE BY MOUTH EVERY DAY  . oxybutynin  (DITROPAN-XL) 10 MG 24 hr tablet TAKE 1 TABLET BY MOUTH EVERYDAY AT BEDTIME  . phentermine (ADIPEX-P) 37.5 MG tablet Take 1 tablet (37.5 mg total) by mouth daily before breakfast.  . traZODone (DESYREL) 50 MG tablet Take 2 tablets (100 mg total) by mouth daily.  Marland Kitchen venlafaxine XR (EFFEXOR-XR) 75 MG 24 hr capsule TAKE 2 CAPSULES (150 MG TOTAL) BY MOUTH DAILY WITH BREAKFAST.  Marland Kitchen Vitamin D, Ergocalciferol, (DRISDOL) 1.25 MG (50000 UT) CAPS capsule TAKE 1 CAPSULE (50,000 UNITS TOTAL) BY MOUTH EVERY 7 (SEVEN) DAYS.   No facility-administered medications prior to visit.    Review of Systems  Constitutional: Negative.   Cardiovascular: Positive for leg swelling.  Musculoskeletal: Positive for joint swelling.  Neurological: Negative.   Hematological: Negative.       Objective    BP 134/74 (BP Location: Left Arm, Patient Position: Sitting, Cuff Size: Normal)   Pulse 72   Temp (!) 96.8 F (36 C) (Temporal)   Wt 254 lb (115.2 kg)   SpO2 96%   BMI 41.62 kg/m    Physical Exam Constitutional:      Appearance: Normal appearance.  Cardiovascular:     Rate and Rhythm: Normal rate and regular rhythm.     Pulses: Normal pulses.     Heart sounds: Normal heart sounds.  Pulmonary:     Effort: Pulmonary effort is normal.     Breath sounds: Normal breath sounds.  Musculoskeletal:     Right lower leg: Edema present.     Left lower leg:  Edema present.     Right ankle: Swelling present.     Left ankle: Swelling present.     Right foot: Swelling present.     Left foot: Swelling present.     Comments: Mild pedal edema.  Skin:    General: Skin is warm and dry.  Neurological:     General: No focal deficit present.     Mental Status: She is alert and oriented to person, place, and time.  Psychiatric:        Mood and Affect: Mood normal.        Behavior: Behavior normal.       No results found for any visits on 08/08/19.  Assessment & Plan    1. Foot swelling Patient has bilateral foot  and ankle swelling. Patient was advised to stop taking Amlodipine and follow-up with Tawanna Sat in 2 weeks.  2. OAB (overactive bladder)  - oxybutynin (DITROPAN-XL) 10 MG 24 hr tablet; TAKE 1 TABLET BY MOUTH EVERYDAY AT BEDTIME  Dispense: 90 tablet; Refill: 0  3. Urge incontinence of urine  - oxybutynin (DITROPAN-XL) 10 MG 24 hr tablet; TAKE 1 TABLET BY MOUTH EVERYDAY AT BEDTIME  Dispense: 90 tablet; Refill: 0       I, Trinna Post, PA-C, have reviewed all documentation for this visit. The documentation on 08/08/19 for the exam, diagnosis, procedures, and orders are all accurate and complete.    Paulene Floor  [facility] [phone] (phone) [phone] (fax)  [place]

## 2019-08-10 ENCOUNTER — Other Ambulatory Visit: Payer: Self-pay | Admitting: Physician Assistant

## 2019-08-10 DIAGNOSIS — I10 Essential (primary) hypertension: Secondary | ICD-10-CM

## 2019-08-14 ENCOUNTER — Telehealth: Payer: Self-pay

## 2019-08-14 NOTE — Telephone Encounter (Signed)
LMTCB -If patient calls back OK for [facility] nurse to schedule patient for preop appointment 40 minutes.

## 2019-08-18 NOTE — Progress Notes (Signed)
Established patient visit   Patient: Robin Espinoza   DOB: Apr 26, 1945   74 y.o. Female  MRN: [id number] Visit Date: 08/22/2019  Today's healthcare provider: Mar Daring, PA-C   Chief Complaint  Patient presents with  . Follow-up  . Pre-op Exam   Subjective    Robin Espinoza is a 74 yr old female that presents today for pre-operative evaluation. She is scheduled for a Right total knee replacement on 09/29/19. She has been doing intermittent fasting for weight loss and has lost approx 25 pounds. She has never had issues with anesthesia. She does have sleep apnea.   Follow up for foot swelling  The patient was last seen for this 2 weeks ago. Changes made at last visit include discontinue Amlodipine.  She reports excellent compliance with treatment. She feels that condition is Improved. She is not having side effects.   -----------------------------------------------------------------------------------------    Patient Active Problem List   Diagnosis Date Noted  . BMI 40.0-44.9, adult ([place]) 04/15/2018  . Avitaminosis D 02/28/2015  . Hypothyroidism 11/13/2014  . Anxiety disorder 10/15/2014  . Insomnia 09/21/2014  . Acid reflux 11/30/2007  . Arthritis, degenerative 09/16/2007  . Fam hx-ischem heart disease 02/12/2007  . Adiposity 02/09/2007  . Obstructive apnea 05/06/2006  . 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 BP (high blood pressure) 03/23/1998   Past Surgical History:  Procedure Laterality Date  . Cortisone injections in back    . TUBAL LIGATION  1981   Social History   Socioeconomic History  . Marital status: Married    Spouse name: Not on file  . Number of children: 3  . Years of education: Not on file  . Highest education level: Master's degree (e.g., MA, MS, MEng, MEd, MSW, MBA)  Occupational History  . Occupation: retired  Tobacco Use  . Smoking status: Former Research scientist (life sciences)  . Smokeless tobacco: Never Used  . Tobacco  comment: in college  Substance and Sexual Activity  . Alcohol use: Yes    Comment: rarely 1 drink  . Drug use: No  . Sexual activity: Not on file  Other Topics Concern  . Not on file  Social History Narrative  . Not on file   Social Determinants of Health   Financial Resource Strain: Low Risk   . Difficulty of Paying Living Expenses: Not hard at all  Food Insecurity: No Food Insecurity  . Worried About Charity fundraiser in the Last Year: Never true  . Ran Out of Food in the Last Year: Never true  Transportation Needs: No Transportation Needs  . Lack of Transportation (Medical): No  . Lack of Transportation (Non-Medical): No  Physical Activity: Inactive  . Days of Exercise per Week: 0 days  . Minutes of Exercise per Session: 0 min  Stress: Stress Concern Present  . Feeling of Stress : To some extent  Social Connections: Not Isolated  . Frequency of Communication with Friends and Family: More than three times a week  . Frequency of Social Gatherings with Friends and Family: More than three times a week  . Attends Religious Services: More than 4 times per year  . Active Member of Clubs or Organizations: Yes  . Attends Archivist Meetings: More than 4 times per year  . Marital Status: Married  Human resources officer Violence: Not At Risk  . Fear of Current or Ex-Partner: No  . Emotionally Abused: No  . Physically Abused: No  . Sexually Abused: No  Medications: Outpatient Medications Prior to Visit  Medication Sig  . amLODipine (NORVASC) 5 MG tablet TAKE 1 TABLET BY MOUTH EVERY DAY  . clotrimazole-betamethasone (LOTRISONE) cream Apply 1 application topically 2 (two) times daily. (Patient taking differently: Apply 1 application topically 2 (two) times daily. As needed)  . etodolac (LODINE) 500 MG tablet TAKE 1 TABLET BY MOUTH TWICE A DAY  . FLUZONE HIGH-DOSE 0.5 ML injection TO BE ADMINISTERED BY PHARMACIST FOR IMMUNIZATION  . levothyroxine (SYNTHROID) 112 MCG  tablet TAKE 1 TABLET (112 MCG TOTAL) BY MOUTH DAILY BEFORE BREAKFAST.  Marland Kitchen lovastatin (MEVACOR) 40 MG tablet TAKE 1 TABLET BY MOUTH EVERYDAY AT BEDTIME  . metoprolol succinate (TOPROL-XL) 100 MG 24 hr tablet TAKE 1 TABLET (100 MG TOTAL) BY MOUTH DAILY. TAKE WITH OR IMMEDIATELY FOLLOWING A MEAL.  . Multiple Vitamin (MULTIVITAMIN) capsule Take 1 capsule by mouth daily.  . NON FORMULARY CPAP  . omeprazole (PRILOSEC) 20 MG capsule TAKE 1 CAPSULE BY MOUTH EVERY DAY  . oxybutynin (DITROPAN-XL) 10 MG 24 hr tablet TAKE 1 TABLET BY MOUTH EVERYDAY AT BEDTIME  . phentermine (ADIPEX-P) 37.5 MG tablet Take 1 tablet (37.5 mg total) by mouth daily before breakfast.  . traZODone (DESYREL) 50 MG tablet Take 2 tablets (100 mg total) by mouth daily.  Marland Kitchen venlafaxine XR (EFFEXOR-XR) 75 MG 24 hr capsule TAKE 2 CAPSULES (150 MG TOTAL) BY MOUTH DAILY WITH BREAKFAST.  Marland Kitchen Vitamin D, Ergocalciferol, (DRISDOL) 1.25 MG (50000 UT) CAPS capsule TAKE 1 CAPSULE (50,000 UNITS TOTAL) BY MOUTH EVERY 7 (SEVEN) DAYS.   No facility-administered medications prior to visit.    Review of Systems  Constitutional: Negative.   Respiratory: Negative.   Cardiovascular: Negative.   Genitourinary: Negative.   Musculoskeletal: Positive for arthralgias and gait problem.  Neurological: Positive for weakness.    Last CBC Lab Results  Component Value Date   WBC 11.2 (H) 04/07/2019   HGB 15.4 04/07/2019   HCT 45.9 04/07/2019   MCV 98 (H) 04/07/2019   MCH 32.8 04/07/2019   RDW 13.1 04/07/2019   PLT 250 [id number]   Last metabolic panel Lab Results  Component Value Date   GLUCOSE 94 10/06/2018   NA 141 10/06/2018   K 4.5 10/06/2018   CL 101 10/06/2018   CO2 23 10/06/2018   BUN 14 10/06/2018   CREATININE 0.83 10/06/2018   GFRNONAA 70 10/06/2018   GFRAA 81 10/06/2018   CALCIUM 9.0 10/06/2018   PROT 6.6 04/19/2018   ALBUMIN 3.9 04/19/2018   LABGLOB 2.7 04/19/2018   AGRATIO 1.4 04/19/2018   BILITOT 0.3 04/19/2018   ALKPHOS 116  04/19/2018   AST 14 04/19/2018   ALT 15 04/19/2018   ANIONGAP 7 05/03/2012      Objective    BP 134/86 (BP Location: Left Arm, Patient Position: Sitting, Cuff Size: Large)   Pulse 61   Temp (!) 96.9 F (36.1 C) (Temporal)   Resp 16   Ht 5' 4.5" (1.638 m)   Wt 249 lb 12.8 oz (113.3 kg)   BMI 42.22 kg/m  BP Readings from Last 3 Encounters:  08/22/19 134/86  08/08/19 134/74  06/22/19 (!) 143/90   Wt Readings from Last 3 Encounters:  08/22/19 249 lb 12.8 oz (113.3 kg)  08/08/19 254 lb (115.2 kg)  06/22/19 263 lb 3.2 oz (119.4 kg)     Physical Exam Vitals reviewed.  Constitutional:      General: She is not in acute distress.    Appearance: Normal appearance. She is well-developed.  She is obese. She is not ill-appearing or diaphoretic.  Neck:     Thyroid: No thyromegaly.     Vascular: No carotid bruit or JVD.     Trachea: No tracheal deviation.  Cardiovascular:     Rate and Rhythm: Normal rate and regular rhythm.     Pulses: Normal pulses.     Heart sounds: Normal heart sounds. No murmur. No friction rub. No gallop.   Pulmonary:     Effort: Pulmonary effort is normal. No respiratory distress.     Breath sounds: Normal breath sounds. No wheezing or rales.  Musculoskeletal:     Cervical back: Normal range of motion and neck supple.  Lymphadenopathy:     Cervical: No cervical adenopathy.  Neurological:     General: No focal deficit present.     Mental Status: She is alert. Mental status is at baseline.  Psychiatric:        Mood and Affect: Mood normal.       No results found for any visits on 08/22/19.  Assessment & Plan     1. Pre-op exam EKG was changed compared to previous EKG from 2008. Now showing a poor R wave progression most often seen with pulmonary issues. Patient has no SOB or chronic cough. Does have known sleep apnea. Rate was 64 and there were no ST segment changes. UA did show pyuria, but patient is asymptomatic. Will send for culture and f/u  pending results. Labs have been ordered and will be obtained next week to be in the 30 day window. If labs are normal and at baseline, current surgical risk is low with a 0.20% risk of mortality within 30 days of surgery using the SORT calculator. Risk of cardiovascular incident intra-operatively and up to 30 days post op is low at 0.2% as well using the [facility] Perioperative risk calculator.  - EKG 12-Lead - CBC w/Diff/Platelet - Basic Metabolic Panel (BMET) - HgB A1c - POCT Urinalysis Dipstick  2. Essential hypertension Stable. Continue Metoprolol 100mg  XR daily. Will check labs as below and f/u pending results. - CBC w/Diff/Platelet - Basic Metabolic Panel (BMET) - HgB A1c  3. Hypothyroidism due to acquired atrophy of thyroid Stable. Continue Levothyroxine 1108mcg. Will check labs as below and f/u pending results. - CBC w/Diff/Platelet - Basic Metabolic Panel (BMET) - HgB A1c  4. Obstructive apnea Uses CPAP nightly.  - CBC w/Diff/Platelet - Basic Metabolic Panel (BMET) - HgB A1c  5. Primary osteoarthritis of right knee Surgery scheduled for 09/29/19. - CBC w/Diff/Platelet - Basic Metabolic Panel (BMET) - HgB A1c  6. BMI 40.0-44.9, adult ([facility]) Has lost around 25 pounds using intermittent fasting.  - CBC w/Diff/Platelet - Basic Metabolic Panel (BMET) - HgB A1c  7. Pyuria Urine culture sent to confirm infection so she may be treated prior to surgery.  - Urine Culture   No follow-ups on file.      Reynolds Bowl, PA-C, have reviewed all documentation for this visit. The documentation on 08/23/19 for the exam, diagnosis, procedures, and orders are all accurate and complete.   Rubye Beach  [facility] [phone] (phone) [phone] (fax)  [place]

## 2019-08-22 ENCOUNTER — Other Ambulatory Visit: Payer: Self-pay

## 2019-08-22 ENCOUNTER — Encounter: Payer: Self-pay | Admitting: Physician Assistant

## 2019-08-22 ENCOUNTER — Ambulatory Visit (INDEPENDENT_AMBULATORY_CARE_PROVIDER_SITE_OTHER): Payer: PPO | Admitting: Physician Assistant

## 2019-08-22 VITALS — BP 134/86 | HR 61 | Temp 96.9°F | Resp 16 | Ht 64.5 in | Wt 249.8 lb

## 2019-08-22 DIAGNOSIS — G4733 Obstructive sleep apnea (adult) (pediatric): Secondary | ICD-10-CM | POA: Diagnosis not present

## 2019-08-22 DIAGNOSIS — Z01818 Encounter for other preprocedural examination: Secondary | ICD-10-CM

## 2019-08-22 DIAGNOSIS — R8281 Pyuria: Secondary | ICD-10-CM | POA: Diagnosis not present

## 2019-08-22 DIAGNOSIS — I10 Essential (primary) hypertension: Secondary | ICD-10-CM | POA: Diagnosis not present

## 2019-08-22 DIAGNOSIS — M1711 Unilateral primary osteoarthritis, right knee: Secondary | ICD-10-CM | POA: Diagnosis not present

## 2019-08-22 DIAGNOSIS — E034 Atrophy of thyroid (acquired): Secondary | ICD-10-CM | POA: Diagnosis not present

## 2019-08-22 DIAGNOSIS — Z6841 Body Mass Index (BMI) 40.0 and over, adult: Secondary | ICD-10-CM | POA: Diagnosis not present

## 2019-08-22 LAB — POCT URINALYSIS DIPSTICK
Bilirubin, UA: POSITIVE
Blood, UA: NEGATIVE
Glucose, UA: NEGATIVE
Ketones, UA: NEGATIVE
Nitrite, UA: NEGATIVE
Protein, UA: NEGATIVE
Spec Grav, UA: 1.015 (ref 1.010–1.025)
Urobilinogen, UA: 0.2 E.U./dL
pH, UA: 6 (ref 5.0–8.0)

## 2019-08-22 NOTE — Patient Instructions (Signed)
Total Knee Replacement, Care After This sheet gives you information about how to care for yourself after your procedure. Your doctor may also give you more specific instructions. If you have problems or questions, contact your doctor. What can I expect after the procedure? After the procedure, it is common to have:  Pain.  Swelling.  A Barrell amount of blood coming from your cut from surgery (incision).  Clear fluid coming from your cut from surgery.  Limited movement of your knee. Follow these instructions at home: Medicines  Take over-the-counter and prescription medicines only as told by your doctor.  If you were prescribed a blood thinner (anticoagulant), take it as told by your doctor.  Ask your doctor if the medicine prescribed to you: ? Requires you to avoid driving or using heavy machinery. ? Can cause trouble pooping (constipation). You may need to take steps to prevent or treat trouble pooping:  Drink enough fluid to keep your pee (urine) pale yellow.  Take over-the-counter or prescription medicines.  Eat foods that are high in fiber. These include beans, whole grains, and fresh fruits and vegetables.  Limit foods that are high in fat and sugar. These include fried or sweet foods. Bathing  Do not take baths, swim, or use a hot tub until your doctor approves. Ask your doctor if you may take showers. You may only be allowed to take sponge baths.  Keep your bandage (dressing) dry until your doctor says it can be taken off. Incision care and drain care   Follow instructions from your doctor about how to take care of your cut from surgery. Make sure you: ? Wash your hands with soap and water before and after you change your bandage. If you cannot use soap and water, use hand sanitizer. ? Change your bandage as told by your doctor. ? Leave stitches (sutures), skin glue, or skin tape (adhesive) strips in place. They may need to stay in place for 2 weeks or longer. If tape  strips get loose and curl up, you may trim the loose edges. Do not remove tape strips completely unless your doctor says it is okay.  Check your cut from surgery and your drain site every day for signs of infection. Check for: ? More redness, swelling, or pain. ? More fluid or blood. ? Warmth. ? Pus or a bad smell.  If you have a drain, follow instructions from your doctor about caring for it. Managing pain, stiffness, and swelling      If told, put ice on your knee. ? Put ice in a plastic bag or use the icing device (cold flow pad or cryocuff) that you were given. Follow your doctor's directions about how to use the icing device. ? Place a towel between your skin and the bag or between your skin and the icing device. ? Leave the ice on for 20 minutes, 2-3 times per day.  If told, put heat on your knee before you exercise. Use the heat source that your doctor recommends, such as a moist heat pack or a heating pad. ? Place a towel between your skin and the heat source. ? Leave the heat on for 20-30 minutes. ? Remove the heat if your skin turns bright red. This is very important if you are unable to feel pain, heat, or cold. You may have a greater risk of getting burned.  Move your toes often.  Raise (elevate) your knee above the level of your heart while you are sitting or   lying down. ? Use several pillows to keep your leg straight. ? Do not put a pillow just under the knee. If the knee is bent for a long time, this may make the knee stiff.  Wear elastic knee support as told by your doctor. Activity  Rest as told by your doctor.  Do not sit for a long time without moving. Get up to take short walks every 1-2 hours. This is important. Ask for help if you feel weak or unsteady.  Ask your doctor what activities are safe for you.  Avoid activities that put stress on your knees. These include running, jumping rope, and jumping jacks.  Do not play contact sports until your doctor  says it is okay.  Do exercises as told by your physical therapist.  If you have been sent home with a knee joint motion machine (continuous passive motion machine), use it as told by your doctor. Safety   Do not use your leg to support your body weight until your doctor says that you can. Use crutches or a walker as told by your doctor.  Do not drive until your doctor says it is okay. Ask your doctor when it is safe to drive. General instructions  Do not use any products that contain nicotine or tobacco, such as cigarettes, e-cigarettes, and chewing tobacco. These can delay healing. If you need help quitting, ask your doctor.  Wear special socks (compression stockings) as told by your doctor.  Tell your doctor if you plan to have dental work. Also: ? Tell your dentist about your joint replacement. ? Ask your doctor if there are instructions you need to follow before dental care and routine cleanings.  Keep all follow-up visits as told by your doctor. This is important. Contact a doctor if:  You have more redness, swelling, or pain around your cut from surgery or your drain.  You have more fluid or blood coming from your cut from surgery or your drain.  You have pus or a bad smell coming from your cut from surgery or your drain.  Your cut from surgery or your drain area feels warm to the touch.  You have a fever.  Your cut breaks open.  You have knee pain that does not go away.  The movement of your knee is getting worse.  Your new joint feels loose. Get help right away if you have:  Pain in your calf or thigh.  Swelling in your calf or thigh.  Shortness of breath.  Trouble breathing.  Chest pain. Summary  After the procedure, it is common to have pain and swelling, blood or fluid coming from your cut from surgery, and trouble moving your knee.  Follow instructions from your doctor about how to take care of your cut from surgery.  Use crutches or a walker as  told by your doctor.  If you were prescribed a blood thinner, take it as told by your doctor.  Keep all follow-up visits as told by your doctor. This is important. This information is not intended to replace advice given to you by your health care provider. Make sure you discuss any questions you have with your health care provider. Document Revised: 07/18/2018 Document Reviewed: 10/21/2017 Elsevier Patient Education  2020 Elsevier Inc.  

## 2019-08-24 ENCOUNTER — Telehealth: Payer: Self-pay

## 2019-08-24 LAB — URINE CULTURE

## 2019-08-24 NOTE — Telephone Encounter (Signed)
-----   Message from Mar Daring, [place] sent at 08/24/2019 10:55 AM EDT ----- Urine culture is negative for any bacterial growth

## 2019-08-24 NOTE — Telephone Encounter (Signed)
MTCB or to view message results on my chart. If patient calls back OK for [facility] nurse to give results

## 2019-08-25 NOTE — Telephone Encounter (Signed)
LMCTVB

## 2019-08-25 NOTE — Telephone Encounter (Signed)
Attempted to call pt.  Left vm to return call to office to discuss recent lab results.  Advised she can view recent lab results on MyChart.

## 2019-08-30 DIAGNOSIS — D225 Melanocytic nevi of trunk: Secondary | ICD-10-CM | POA: Diagnosis not present

## 2019-08-30 DIAGNOSIS — L718 Other rosacea: Secondary | ICD-10-CM | POA: Diagnosis not present

## 2019-08-30 DIAGNOSIS — L821 Other seborrheic keratosis: Secondary | ICD-10-CM | POA: Diagnosis not present

## 2019-08-30 DIAGNOSIS — L570 Actinic keratosis: Secondary | ICD-10-CM | POA: Diagnosis not present

## 2019-08-30 DIAGNOSIS — D2261 Melanocytic nevi of right upper limb, including shoulder: Secondary | ICD-10-CM | POA: Diagnosis not present

## 2019-08-30 DIAGNOSIS — D2272 Melanocytic nevi of left lower limb, including hip: Secondary | ICD-10-CM | POA: Diagnosis not present

## 2019-08-30 DIAGNOSIS — X32XXXA Exposure to sunlight, initial encounter: Secondary | ICD-10-CM | POA: Diagnosis not present

## 2019-08-30 DIAGNOSIS — D2271 Melanocytic nevi of right lower limb, including hip: Secondary | ICD-10-CM | POA: Diagnosis not present

## 2019-08-30 DIAGNOSIS — D692 Other nonthrombocytopenic purpura: Secondary | ICD-10-CM | POA: Diagnosis not present

## 2019-08-30 DIAGNOSIS — D2262 Melanocytic nevi of left upper limb, including shoulder: Secondary | ICD-10-CM | POA: Diagnosis not present

## 2019-09-10 ENCOUNTER — Other Ambulatory Visit: Payer: Self-pay | Admitting: Orthopedic Surgery

## 2019-09-11 ENCOUNTER — Other Ambulatory Visit: Payer: Self-pay | Admitting: Physician Assistant

## 2019-09-11 DIAGNOSIS — E559 Vitamin D deficiency, unspecified: Secondary | ICD-10-CM

## 2019-09-11 NOTE — Telephone Encounter (Signed)
Requested medication (s) are due for refill today: yes  Requested medication (s) are on the active medication list: yes  Last refill:  06/18/2019  Future visit scheduled: no  Notes to clinic:  this refill cannot be delegated    Requested Prescriptions  Pending Prescriptions Disp Refills   Vitamin D, Ergocalciferol, (DRISDOL) 1.25 MG (50000 UNIT) CAPS capsule [Pharmacy Med Name: VITAMIN D2 1.25MG (50,000 UNIT)] 12 capsule 1    Sig: TAKE 1 CAPSULE (50,000 UNITS TOTAL) BY MOUTH EVERY 7 (SEVEN) DAYS.      Endocrinology:  Vitamins - Vitamin D Supplementation Failed - 09/11/2019  1:28 AM      Failed - 50,000 IU strengths are not delegated      Failed - Phosphate in normal range and within 360 days    No results found for: PHOS        Failed - Vitamin D in normal range and within 360 days    Vit D, 25-Hydroxy  Date Value Ref Range Status  04/07/2019 26.0 (L) 30.0 - 100.0 ng/mL Final    Comment:    Vitamin D deficiency has been defined by the Institute of Medicine and an Endocrine Society practice guideline as a level of serum 25-OH vitamin D less than 20 ng/mL (1,2). The Endocrine Society went on to further define vitamin D insufficiency as a level between 21 and 29 ng/mL (2). 1. IOM (Institute of Medicine). 2010. Dietary reference    intakes for calcium and D. [place]: The    [employer]. 2. Holick MF, Binkley [place], Bischoff-Ferrari HA, et al.    Evaluation, treatment, and prevention of vitamin D    deficiency: an Endocrine Society clinical practice    guideline. JCEM. 2011 Jul; 96(7):1911-30.           Passed - Ca in normal range and within 360 days    Calcium  Date Value Ref Range Status  10/06/2018 9.0 8.7 - 10.3 mg/dL Final   Calcium, Total  Date Value Ref Range Status  05/03/2012 9.0 8.5 - 10.1 mg/dL Final          Passed - Valid encounter within last 12 months    Recent Outpatient Visits           2 weeks ago Pre-op exam   [facility] [place], [place], [place]   1 month ago Foot swelling   [place], [place], [place]   2 months ago Bilateral impacted cerumen   [place], [place], [place]   4 months ago Annual physical exam   [facility] [name] M, PA-C   5 months ago Class 3 severe obesity due to excess calories with serious comorbidity and body mass index (BMI) of 40.0 to 44.9 in adult [facility])   [facility] [place], [place], [place]

## 2019-09-11 NOTE — Telephone Encounter (Signed)
L.O.V. was on 08/22/2019

## 2019-09-13 DIAGNOSIS — H35373 Puckering of macula, bilateral: Secondary | ICD-10-CM | POA: Diagnosis not present

## 2019-09-15 ENCOUNTER — Inpatient Hospital Stay: Admission: RE | Admit: 2019-09-15 | Payer: PPO | Source: Ambulatory Visit

## 2019-09-18 ENCOUNTER — Other Ambulatory Visit: Payer: PPO

## 2019-09-19 ENCOUNTER — Inpatient Hospital Stay
Admission: RE | Admit: 2019-09-19 | Discharge: 2019-09-19 | Disposition: A | Discharge status: Home / Self Care | Payer: PPO | Source: Ambulatory Visit

## 2019-09-19 NOTE — Patient Instructions (Signed)
COVID TESTING Date: Testing site:  [facility] [address] Thru Hours:  8:67 am - 1:00 pm Once you are tested, you are asked to stay quarantined (avoiding public places) until after your surgery.   Your procedure is scheduled on: Report to Day Surgery on the 2nd floor of the [facility]. To find out your arrival time, please call [phone] between 1PM - 3PM on:  REMEMBER: Instructions that are not followed completely may result in serious medical risk, up to and including death; or upon the discretion of your surgeon and anesthesiologist your surgery may need to be rescheduled.  Do not eat food after midnight the night before surgery.  No gum chewing, lozengers or hard candies.  You may however, drink CLEAR liquids up to 2 hours before you are scheduled to arrive for your surgery. Do not drink anything within 2 hours of your scheduled arrival time.  Clear liquids include: - water  - apple juice without pulp - gatorade (not RED) - black coffee or tea (Do NOT add milk or creamers to the coffee or tea) Do NOT drink anything that is not on this list.  Type 1 and Type 2 diabetics should only drink water.  ENSURE PRE-SURGERY CARBOHYDRATE DRINK:  Complete drinking 2 hours prior to scheduled arrival time.  TAKE THESE MEDICATIONS THE MORNING OF SURGERY WITH A SIP OF WATER:  1.  LEVOTHYROXINE 2.  METOPROLOL 3.  OMEPRAZOLE - (take one the night before and one on the morning of surgery - helps to prevent nausea after surgery.) 4.  VENLAFAXINE   Use inhalers on the day of surgery and bring to the hospital.  Stop Metformin and Janumet 2 days prior to surgery.  Take 1/2 of usual insulin dose the night before surgery and none on the morning of surgery.  Follow recommendations from Cardiologist, Pulmonologist or PCP regarding stopping Aspirin, Coumadin, Plavix, Eliquis, Pradaxa, or Pletal.  Stop Anti-inflammatories (NSAIDS) such as Advil, Aleve,  Ibuprofen, Motrin, Naproxen, Naprosyn and Aspirin based products such as Excedrin, Goodys Powder, BC Powder. (May take Tylenol or Acetaminophen if needed.)  Stop ANY OVER THE COUNTER supplements until after surgery. (May continue Vitamin D, Vitamin B, and multivitamin.)  No Alcohol for 24 hours before or after surgery.  No Smoking including e-cigarettes for 24 hours prior to surgery.  No chewable tobacco products for at least 6 hours prior to surgery.  No nicotine patches on the day of surgery.  Do not use any "recreational" drugs for at least a week prior to your surgery.  Please be advised that the combination of cocaine and anesthesia may have negative outcomes, up to and including death. If you test positive for cocaine, your surgery will be cancelled.  On the morning of surgery brush your teeth with toothpaste and water, you may rinse your mouth with mouthwash if you wish. Do not swallow any toothpaste or mouthwash.  Do not wear jewelry, make-up, hairpins, clips or nail polish.  Do not wear lotions, powders, or perfumes.   Do not shave 48 hours prior to surgery.   Contact lenses, hearing aids and dentures may not be worn into surgery.  Do not bring valuables to the hospital. [facility] is not responsible for any missing/lost belongings or valuables.   Use CHG Soap or wipes as directed on instruction sheet.  Total Shoulder Arthroplasty:  use Benzolyl Peroxide 5% Gel as directed on instruction sheet.  Fleets enema or Magnesium Citrate as directed.  Bring  your C-PAP to the hospital with you in case you may have to spend the night.   Notify your doctor if there is any change in your medical condition (cold, fever, infection).  Wear comfortable clothing (specific to your surgery type) to the hospital.  Plan for stool softeners for home use; pain medications have a tendency to cause constipation. You can also help prevent constipation by eating foods high in fiber such as  fruits and vegetables and drinking plenty of fluids as your diet allows.  After surgery, you can help prevent lung complications by doing breathing exercises.  Take deep breaths and cough every 1-2 hours. Your doctor may order a device called an Incentive Spirometer to help you take deep breaths. When coughing or sneezing, hold a pillow firmly against your incision with both hands. This is called "splinting." Doing this helps protect your incision. It also decreases belly discomfort.  If you are being admitted to the hospital overnight, leave your suitcase in the car. After surgery it may be brought to your room.  If you are being discharged the day of surgery, you will not be allowed to drive home. You will need a responsible adult (18 years or older) to drive you home and stay with you that night.   If you are taking public transportation, you will need to have a responsible adult (18 years or older) with you. Please confirm with your physician that it is acceptable to use public transportation.   Please call the [facility] at [phone] if you have any questions about these instructions.  Visitation Policy:  Patients undergoing a surgery or procedure may have one family member or support person with them as long as that person is not COVID-19 positive or experiencing its symptoms.  That person may remain in the waiting area during the procedure.  Children under 45 years of age may have both parents or legal guardians with them during their procedure.  Inpatient Visitation Update:   Two designated support people may visit a patient during visiting hours 7 am to 8 pm. It must be the same two designated people for the duration of the patient stay. The visitors may come and go during the day, and there is no switching out to have different visitors. A mask must be worn at all times, including in the patient room.  Children under 59 years of age:  a total of 4  designated visitors for the child's entire stay are allowed. Only 2 in the room at a time and only one staying overnight at a time. The overnight guest can now rotate during the child's hospital stay.  As a reminder, masks are still required for all [facility] team members, patients and visitors in all [facility] facilities.   Systemwide, no visitors 17 or younger.

## 2019-09-19 NOTE — Pre-Procedure Instructions (Signed)
Call to patient to do preop interview at the following times today; 9:23 am 10:11 am 11:08 am 1:07 pm  messages were left.

## 2019-09-19 NOTE — Pre-Procedure Instructions (Signed)
Another attempt to call patient for preop interview. No answer.

## 2019-09-21 ENCOUNTER — Inpatient Hospital Stay
Admission: RE | Admit: 2019-09-21 | Discharge: 2019-09-21 | Disposition: A | Discharge status: Home / Self Care | Payer: PPO | Source: Ambulatory Visit

## 2019-09-21 ENCOUNTER — Other Ambulatory Visit: Payer: Self-pay | Admitting: Orthopedic Surgery

## 2019-09-21 DIAGNOSIS — M1711 Unilateral primary osteoarthritis, right knee: Secondary | ICD-10-CM | POA: Diagnosis not present

## 2019-09-21 HISTORY — DX: Hypothyroidism, unspecified: E03.9

## 2019-09-21 HISTORY — DX: Overactive bladder: N32.81

## 2019-09-21 NOTE — Patient Instructions (Addendum)
INSTRUCTIONS FOR SURGERY     Your surgery is scheduled for:   Tuesday, July 6TH     To find out your arrival time for the day of surgery,          please call [phone] between 1 pm and 3 pm on :  Friday, July 2ND     When you arrive for surgery, report to the [facility].       Do NOT stop on the first floor to register.    REMEMBER: Instructions that are not followed completely may result in serious medical risk,  up to and including death, or upon the discretion of your surgeon and anesthesiologist,            your surgery may need to be rescheduled.  __X__ 1. Do not eat food after midnight the night before your procedure.                    No gum, candy, lozenger, tic tacs, tums or hard candies.                  ABSOLUTELY NOTHING SOLID IN YOUR MOUTH AFTER MIDNIGHT                    You may drink unlimited clear liquids up to 2 hours before you are scheduled to arrive for surgery.                   Do not drink anything within those 2 hours unless you need to take medicine, then take the                   smallest amount you need.  Clear liquids include:  water, apple juice without pulp,                   any flavor Gatorade, Black coffee, black tea.  Sugar may be added but no dairy/ honey /lemon.                        Broth and jello is not considered a clear liquid.  __x__  2. On the morning of surgery, please brush your teeth with toothpaste and water. You may rinse with                  mouthwash if you wish but DO NOT SWALLOW TOOTHPASTE OR MOUTHWASH  __X___3. NO alcohol for 24 hours before or after surgery.  __x___ 4.  Do NOT smoke or use e-cigarettes for 24 HOURS PRIOR TO SURGERY.                      DO NOT Use any chewable tobacco products for at least 6 hours prior to surgery.  __x___ 5. If you start any new medication after this appointment and prior to surgery, please                    Bring it with you on the day of surgery.  ___x__ 6. Notify your doctor if there is any change in  your medical condition, such as fever,                   infection, vomitting, diarrhea or any open sores.  __x___ 7.  USE the CHG SOAP as instructed, the night before surgery and the day of surgery.                   Once you have washed with this soap, do NOT use any of the following: Powders, perfumes                    or lotions. Please do not wear make up, hairpins, clips or nail polish. You  MAY wear deodorant.                   Men may shave their face and neck.  Women need to shave 48 hours prior to surgery.                   DO NOT wear ANY jewelry on the day of surgery. If there are rings that are too tight to                    remove easily, please address this prior to the surgery day. Piercings need to be removed.                                                                     NO METAL ON YOUR BODY.                    Do NOT bring any valuables.  If you came to Pre-Admit testing then you will not need license,                     insurance card or credit card.  If you will be staying overnight, please either leave your things in                     the car or have your family be responsible for these items.                     [facility] IS NOT RESPONSIBLE FOR BELONGINGS OR VALUABLES.  ___X__ 8. DO NOT wear contact lenses on surgery day.  You may not have dentures,                     Hearing aides, contacts or glasses in the operating room. These items can be                    Placed in the Recovery Room to receive immediately after surgery.  __x___ 9. IF YOU ARE SCHEDULED TO GO HOME ON THE SAME DAY, YOU MUST                   Have someone to drive you home and to stay with you  for the first 24 hours.                    Have an arrangement prior to arriving on surgery day.  ___x__ 10. Take the following medications  on the morning of surgery with a sip of water:                               1.  SYNTHROID                     2.  METOPROLOL                     3.  EFFEXOR                     4.  PRILOSEC                     5.  THERA TEARS, if needed                     6.  _____ 11.  Follow any instructions provided to you by your surgeon.                        Such as enema, clear liquid bowel prep  __X__  12. STOP ALL ASPIRIN PRODUCTS TODAY, 09/21/19                       THIS INCLUDES BC POWDERS / GOODIES POWDER  __x___ 13. STOP Anti-inflammatories as of: TODAY, 09/21/19                      This includes IBUPROFEN / MOTRIN / ADVIL / ALEVE/                        NAPROXYN / ETODALAC                    YOU MAY TAKE TYLENOL ANY TIME PRIOR TO SURGERY.  __X___ 25.  Stop supplements until after surgery.                     This includes: MULTIVITAMINS // [place] may continue taking Vitamin B12 / Vitamin D3 but do not take on the morning of surgery.  __X___ 15. Bring your CPAP machine into preop with you on the morning of surgery.  ______16.  Stop Metformin 2 full days prior to surgery.  Stop on:                     TAKE 1/2 OF USUAL INSULIN DOSE ON THE EVENING PRIOR TO SURGERY.                     Do NOT take any diabetes medications on surgery day.  _____17.  Continue to take the following medications but do not take on the morning of surgery:                            __X____18. If staying overnight, please have appropriate shoes to wear to be able to walk around the unit.                   Wear clean and comfortable clothing to the hospital.  CONTINUE TO TAKE YOUR EVENING MEDICATIONS AS USUAL.  HAVE STOOL SOFTENERS AVAILABLE AT HOME TO  USE AFTER SURGERY.  I RECOMMEND THAT   YOU START TAKING THEM A DAY OR TWO PRIOR TO SURGERY.  IF YOU BRING A CELL PHONE, PLEASE REMEMBER TO BRING A CHARGER.

## 2019-09-21 NOTE — Pre-Procedure Instructions (Signed)
Attempted to contact patient via phone since she was due for an in office appointment prior to her upcoming surgery. Notified Dr. Mack Guise office and spoke with [place].  Informed her that no one has been able to make contact with this patient.  She will inform the doctor to see if anyone there can reach out to her.

## 2019-09-22 ENCOUNTER — Ambulatory Visit
Admission: RE | Admit: 2019-09-22 | Discharge: 2019-09-22 | Disposition: A | Discharge status: Home / Self Care | Payer: PPO | Source: Ambulatory Visit | Attending: Orthopedic Surgery | Admitting: Orthopedic Surgery

## 2019-09-22 ENCOUNTER — Other Ambulatory Visit
Admission: RE | Admit: 2019-09-22 | Discharge: 2019-09-22 | Disposition: A | Discharge status: Home / Self Care | Payer: PPO | Source: Ambulatory Visit | Attending: Orthopedic Surgery | Admitting: Orthopedic Surgery

## 2019-09-22 ENCOUNTER — Other Ambulatory Visit: Payer: Self-pay

## 2019-09-22 DIAGNOSIS — Z01818 Encounter for other preprocedural examination: Secondary | ICD-10-CM | POA: Insufficient documentation

## 2019-09-22 DIAGNOSIS — Z01811 Encounter for preprocedural respiratory examination: Secondary | ICD-10-CM

## 2019-09-22 DIAGNOSIS — Z20822 Contact with and (suspected) exposure to covid-19: Secondary | ICD-10-CM | POA: Diagnosis not present

## 2019-09-22 HISTORY — DX: Depression, unspecified: F32.A

## 2019-09-22 HISTORY — DX: Family history of other specified conditions: Z84.89

## 2019-09-22 LAB — URINALYSIS, COMPLETE (UACMP) WITH MICROSCOPIC
Bilirubin Urine: NEGATIVE
Glucose, UA: NEGATIVE mg/dL
Hgb urine dipstick: NEGATIVE
Ketones, ur: NEGATIVE mg/dL
Nitrite: NEGATIVE
Protein, ur: NEGATIVE mg/dL
Specific Gravity, Urine: 1.009 (ref 1.005–1.030)
pH: 5 (ref 5.0–8.0)

## 2019-09-22 LAB — CBC WITH DIFFERENTIAL/PLATELET
Abs Immature Granulocytes: 0.03 10*3/uL (ref 0.00–0.07)
Basophils Absolute: 0.1 10*3/uL (ref 0.0–0.1)
Basophils Relative: 1 %
Eosinophils Absolute: 0.1 10*3/uL (ref 0.0–0.5)
Eosinophils Relative: 2 %
HCT: 46.5 % — ABNORMAL HIGH (ref 36.0–46.0)
Hemoglobin: 15.4 g/dL — ABNORMAL HIGH (ref 12.0–15.0)
Immature Granulocytes: 0 %
Lymphocytes Relative: 21 %
Lymphs Abs: 1.7 10*3/uL (ref 0.7–4.0)
MCH: 32.6 pg (ref 26.0–34.0)
MCHC: 33.1 g/dL (ref 30.0–36.0)
MCV: 98.3 fL (ref 80.0–100.0)
Monocytes Absolute: 0.8 10*3/uL (ref 0.1–1.0)
Monocytes Relative: 10 %
Neutro Abs: 5.5 10*3/uL (ref 1.7–7.7)
Neutrophils Relative %: 66 %
Platelets: 203 10*3/uL (ref 150–400)
RBC: 4.73 MIL/uL (ref 3.87–5.11)
RDW: 13.3 % (ref 11.5–15.5)
WBC: 8.2 10*3/uL (ref 4.0–10.5)
nRBC: 0 % (ref 0.0–0.2)

## 2019-09-22 LAB — TYPE AND SCREEN
ABO/RH(D): O POS
Antibody Screen: NEGATIVE

## 2019-09-22 LAB — APTT: aPTT: 28 seconds (ref 24–36)

## 2019-09-22 LAB — SURGICAL PCR SCREEN
MRSA, PCR: NEGATIVE
Staphylococcus aureus: NEGATIVE

## 2019-09-22 LAB — BASIC METABOLIC PANEL
Anion gap: 9 (ref 5–15)
BUN: 17 mg/dL (ref 8–23)
CO2: 29 mmol/L (ref 22–32)
Calcium: 9.2 mg/dL (ref 8.9–10.3)
Chloride: 102 mmol/L (ref 98–111)
Creatinine, Ser: 0.83 mg/dL (ref 0.44–1.00)
GFR calc Af Amer: >60 mL/min (ref >60)
GFR calc non Af Amer: >60 mL/min (ref >60)
Glucose, Bld: 96 mg/dL (ref 70–99)
Potassium: 4.1 mmol/L (ref 3.5–5.1)
Sodium: 140 mmol/L (ref 135–145)

## 2019-09-22 LAB — PROTIME-INR
INR: 1 (ref 0.8–1.2)
Prothrombin Time: 12.6 seconds (ref 11.4–15.2)

## 2019-09-22 LAB — SARS CORONAVIRUS 2 (TAT 6-24 HRS): SARS Coronavirus 2: NEGATIVE

## 2019-09-22 IMAGING — CR DG CHEST 2V
1 series · 2 of 2 positions shown · non-contrast
Comparison: Radiograph [DATE], CT [DATE]

CLINICAL DATA: Preoperative examination for right knee replacement,
no symptoms

EXAM:
CHEST - 2 VIEW

[Series 1: dg chest 2 view · 0.14mm/px · 2 of 2 slices shown]
[im 1/2]
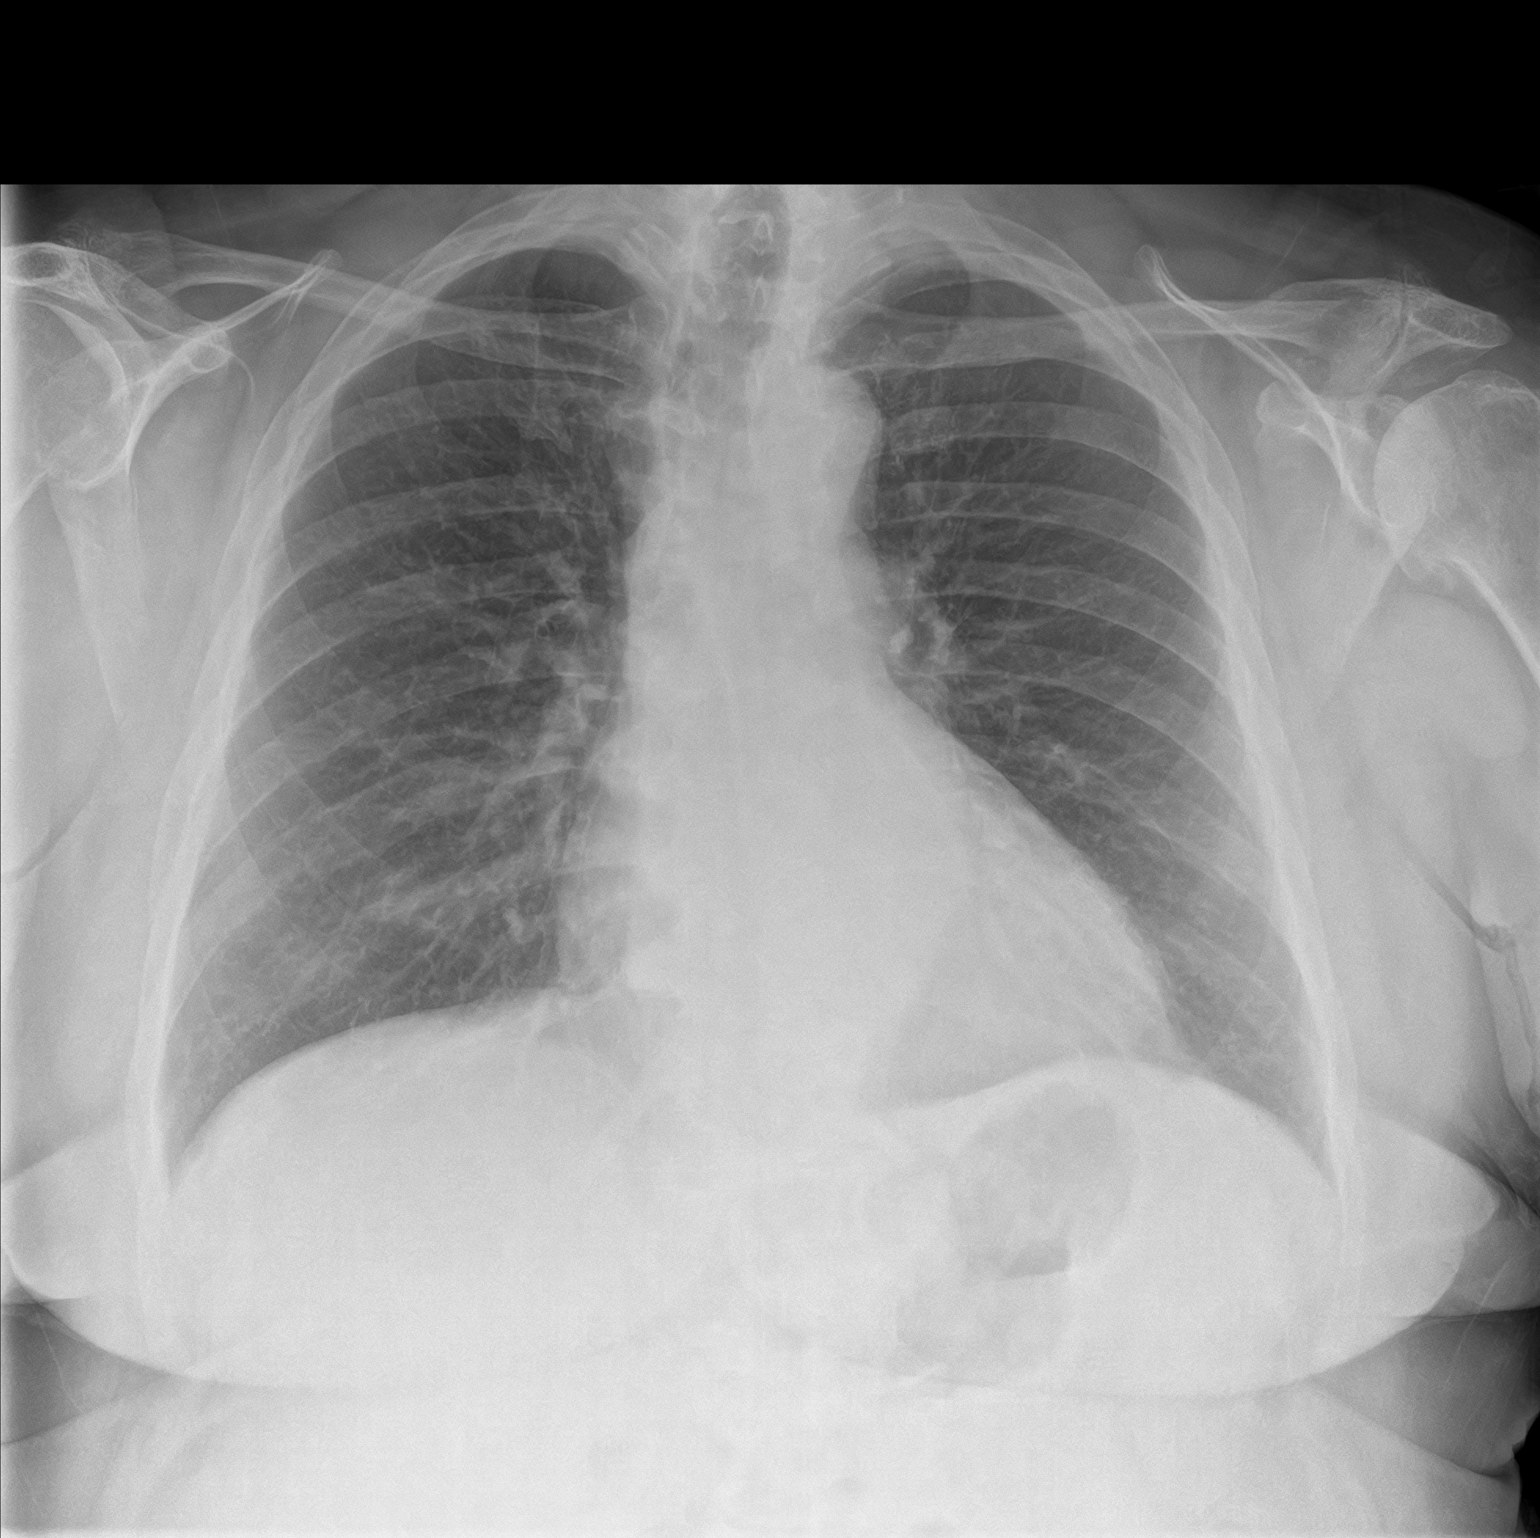
[im 2/2]
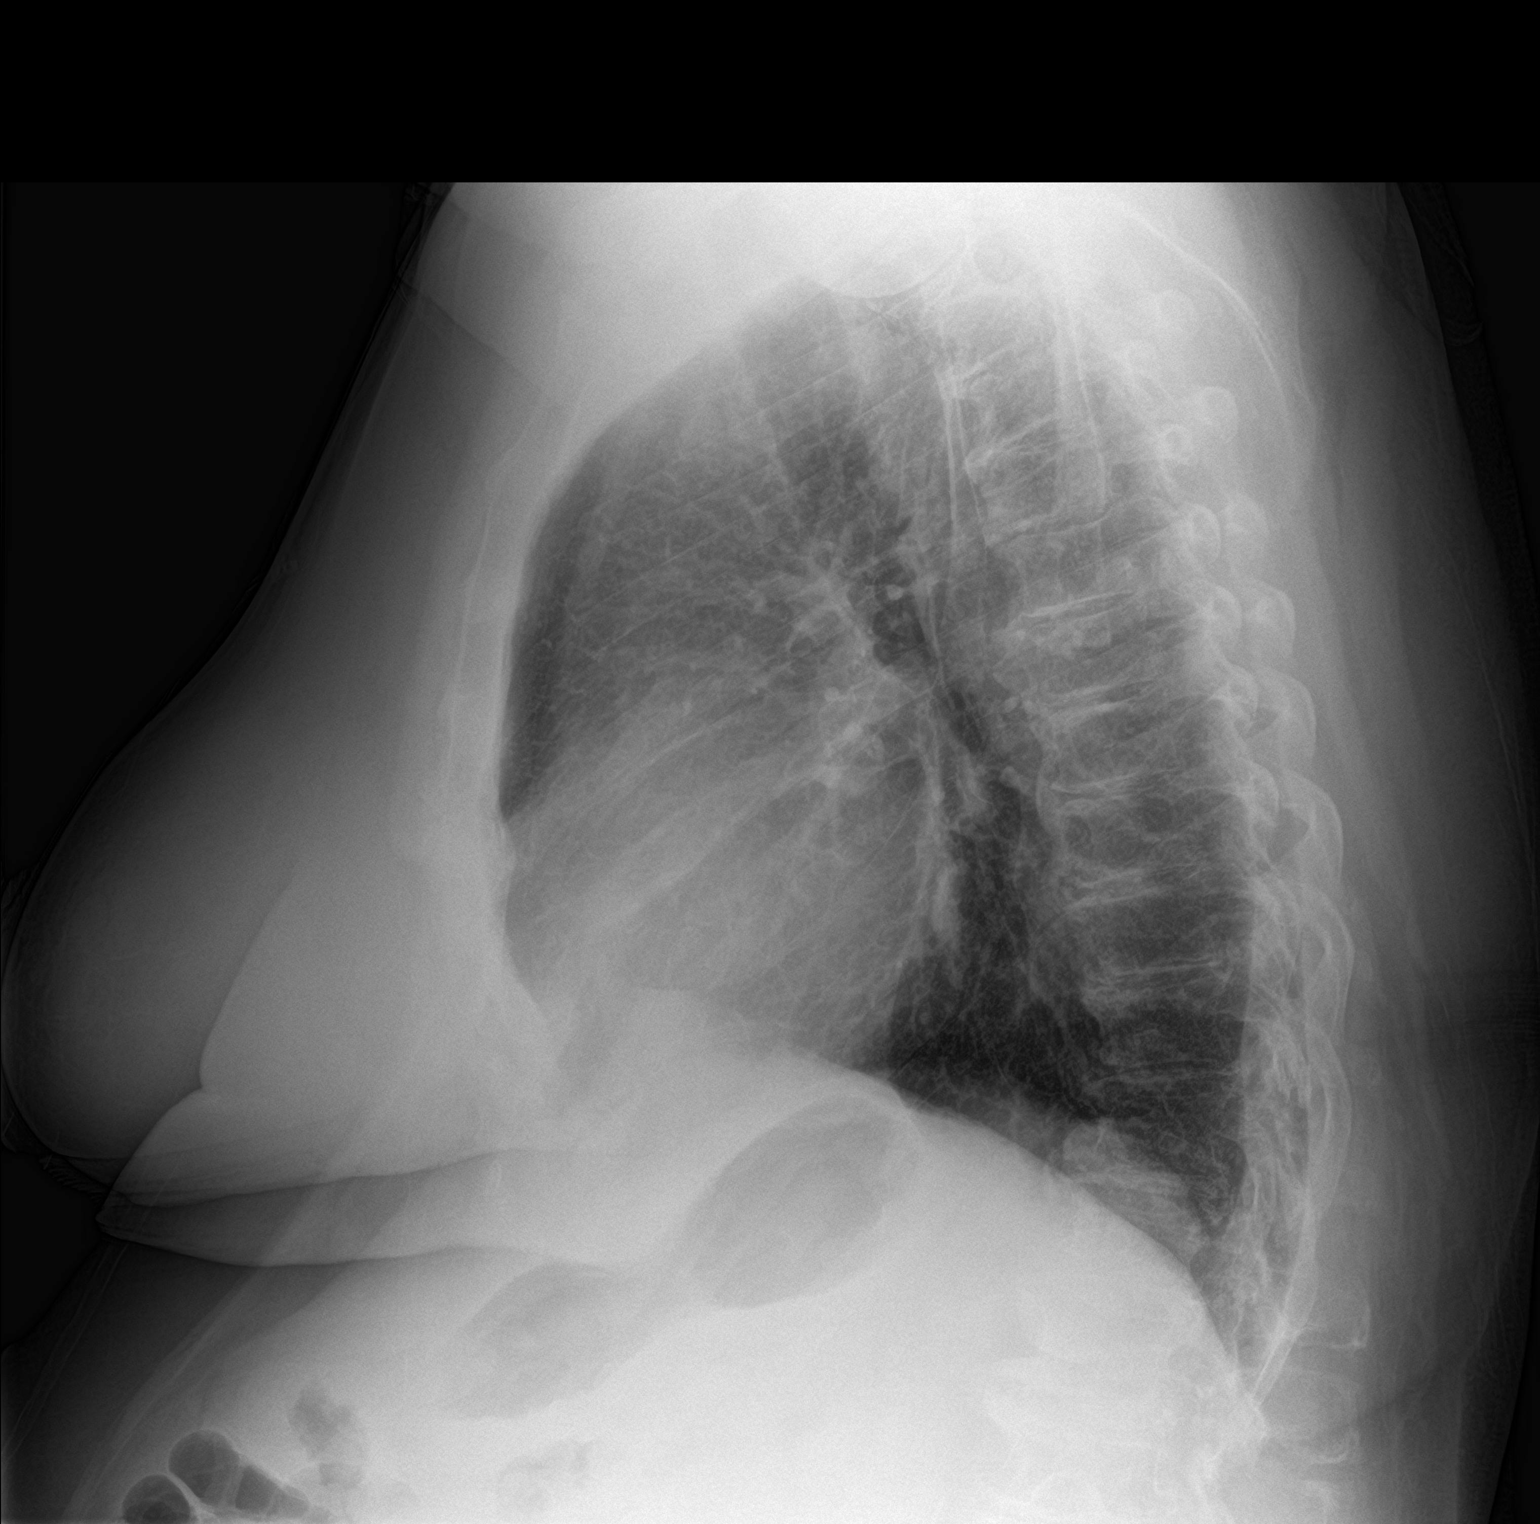

[2 of 2 positions shown; findings below may reference images not displayed]

FINDINGS: No consolidation, features of edema, pneumothorax, or effusion.
Pulmonary vascularity is normally distributed. The cardiomediastinal
contours are unremarkable. No acute osseous or soft tissue
abnormality. Moderate to severe degenerative changes are present in
the imaged spine and shoulders.
IMPRESSION: No acute cardiopulmonary abnormality.

## 2019-09-22 NOTE — Progress Notes (Signed)
  [facility] Perioperative Services: Pre-Admission/Anesthesia Testing  Abnormal Lab Notification  Date: 09/22/19  Name: NEKA BISE MRN:   [id number]  Re: Abnormal labs noted during PAT appointment  Provider Notified: [place], MD Notification mode: Faxed via [facility]  Labs of concern: Lab Results  Component Value Date   COLORURINE YELLOW (A) 09/22/2019   APPEARANCEUR CLEAR (A) 09/22/2019   LABSPEC 1.009 09/22/2019   PHURINE 5.0 09/22/2019   GLUCOSEU NEGATIVE 09/22/2019   HGBUR NEGATIVE 09/22/2019   BILIRUBINUR NEGATIVE 09/22/2019   KETONESUR NEGATIVE 09/22/2019   PROTEINUR NEGATIVE 09/22/2019   UROBILINOGEN 0.2 08/22/2019   NITRITE NEGATIVE 09/22/2019   LEUKOCYTESUR TRACE (A) 09/22/2019   EPIU 0-5 09/22/2019   WBCU 0-5 09/22/2019   RBCU 0-5 09/22/2019   BACTERIA RARE (A) 09/22/2019   Notes: Added urine culture to further assess for a pathogenically significant infection. Patient is scheduled for TKR on 09/26/2019.  Honor Loh, MSN, APRN, FNP-C, CEN [facility]  Peri-operative Services Nurse Practitioner Phone: [phone] 09/22/19 1:53 PM

## 2019-09-22 NOTE — Patient Instructions (Addendum)
Your procedure is scheduled on: Tuesday September 26, 2019 Report to Day Surgery. To find out your arrival time please call [phone] between 1PM - 3PM on Friday September 22, 2019.  Remember: Instructions that are not followed completely may result in serious medical risk,  up to and including death, or upon the discretion of your surgeon and anesthesiologist your  surgery may need to be rescheduled.     _X__ 1. Do not eat food after midnight the night before your procedure.                 No gum chewing or hard candies. You may drink clear liquids up to 2 hours                 before you are scheduled to arrive for your surgery- DO not drink clear                 liquids within 2 hours of the start of your surgery.                 Clear Liquids include:  water, apple juice without pulp, clear Gatorade, G2 or                  Gatorade Zero (avoid Red/Purple/Blue), Black Coffee or Tea (Do not add                 anything to coffee or tea).  __X___2.Complete the carbohydrate drink provided to you, 2 hours before arrival.  __X__2.  On the morning of surgery brush your teeth with toothpaste and water, you                may rinse your mouth with mouthwash if you wish.  Do not swallow any toothpaste of mouthwash.     _X__ 3.  No Alcohol for 24 hours before or after surgery.   _X__ 4.  Do Not Smoke or use e-cigarettes For 24 Hours Prior to Your Surgery.                 Do not use any chewable tobacco products for at least 6 hours prior to                 Surgery.  _X__  5.  Do not use any recreational drugs (marijuana, cocaine, heroin, ecstacy, MDMA or other)                For at least one week prior to your surgery.  Combination of these drugs with anesthesia                May have life threatening results.  __X__  6.  Notify your doctor if there is any change in your medical condition      (cold, fever, infections).     Do not wear jewelry, make-up, hairpins, clips  or nail polish. Do not wear lotions, powders, or perfumes. You may wear deodorant. Do not shave 48 hours prior to surgery. Men may shave face and neck. Do not bring valuables to the hospital.    [facility] is not responsible for any belongings or valuables.  Contacts, dentures or bridgework may not be worn into surgery. Leave your suitcase in the car. After surgery it may be brought to your room. For patients admitted to the hospital, discharge time is determined by your treatment team.   Patients discharged the day of surgery will not be allowed to drive home.  Make arrangements for someone to be with you for the first 24 hours of your Same Day Discharge.    Please read over the following fact sheets that you were given:   Total Joint Packet    __X__ Take these medicines the morning of surgery with A SIP OF WATER:    1.levothyroxine (SYNTHROID) 112    2. metoprolol succinate (TOPROL-XL) 100 MG   3. omeprazole (PRILOSEC) 20 MG  4.venlafaxine XR (EFFEXOR-XR) 75 MG     __X__ Use CHG  Wipes as directed  __X__ Stop Anti-inflammatories such as Etodolac, ibuprofen, Aleve, Advil, naproxen, aspirin.    __X__ Stop supplements until after surgery.    __X__ Do not start any herbal supplements before your surgery.  __X__ Bring C-Pap to the hospital.

## 2019-09-23 LAB — HEMOGLOBIN A1C
Hgb A1c MFr Bld: 5.3 % (ref 4.8–5.6)
Mean Plasma Glucose: 105 mg/dL

## 2019-09-24 LAB — URINE CULTURE

## 2019-09-26 ENCOUNTER — Ambulatory Visit: Payer: PPO | Admitting: Anesthesiology

## 2019-09-26 ENCOUNTER — Inpatient Hospital Stay: Payer: PPO

## 2019-09-26 ENCOUNTER — Other Ambulatory Visit: Payer: Self-pay

## 2019-09-26 ENCOUNTER — Inpatient Hospital Stay
Admission: RE | Admit: 2019-09-26 | Discharge: 2019-09-29 | DRG: 470 | Disposition: A | Discharge status: Skilled Nursing Facility | Payer: PPO | Attending: Orthopedic Surgery | Admitting: Orthopedic Surgery

## 2019-09-26 ENCOUNTER — Ambulatory Visit: Payer: PPO | Admitting: Urgent Care

## 2019-09-26 ENCOUNTER — Encounter
Admission: RE | Disposition: A | Discharge status: Skilled Nursing Facility | Payer: Self-pay | Source: Home / Self Care | Attending: Orthopedic Surgery

## 2019-09-26 ENCOUNTER — Encounter: Payer: Self-pay | Admitting: Orthopedic Surgery

## 2019-09-26 DIAGNOSIS — I10 Essential (primary) hypertension: Secondary | ICD-10-CM | POA: Diagnosis present

## 2019-09-26 DIAGNOSIS — K219 Gastro-esophageal reflux disease without esophagitis: Secondary | ICD-10-CM | POA: Diagnosis present

## 2019-09-26 DIAGNOSIS — Z20822 Contact with and (suspected) exposure to covid-19: Secondary | ICD-10-CM | POA: Diagnosis present

## 2019-09-26 DIAGNOSIS — R5381 Other malaise: Secondary | ICD-10-CM | POA: Diagnosis not present

## 2019-09-26 DIAGNOSIS — M1711 Unilateral primary osteoarthritis, right knee: Principal | ICD-10-CM | POA: Diagnosis present

## 2019-09-26 DIAGNOSIS — E559 Vitamin D deficiency, unspecified: Secondary | ICD-10-CM | POA: Diagnosis not present

## 2019-09-26 DIAGNOSIS — Z79890 Hormone replacement therapy (postmenopausal): Secondary | ICD-10-CM | POA: Diagnosis not present

## 2019-09-26 DIAGNOSIS — G4733 Obstructive sleep apnea (adult) (pediatric): Secondary | ICD-10-CM | POA: Diagnosis present

## 2019-09-26 DIAGNOSIS — E039 Hypothyroidism, unspecified: Secondary | ICD-10-CM | POA: Diagnosis present

## 2019-09-26 DIAGNOSIS — Z741 Need for assistance with personal care: Secondary | ICD-10-CM | POA: Diagnosis not present

## 2019-09-26 DIAGNOSIS — Z79899 Other long term (current) drug therapy: Secondary | ICD-10-CM

## 2019-09-26 DIAGNOSIS — R262 Difficulty in walking, not elsewhere classified: Secondary | ICD-10-CM | POA: Diagnosis not present

## 2019-09-26 DIAGNOSIS — Z96651 Presence of right artificial knee joint: Secondary | ICD-10-CM | POA: Diagnosis not present

## 2019-09-26 DIAGNOSIS — Z471 Aftercare following joint replacement surgery: Secondary | ICD-10-CM | POA: Diagnosis not present

## 2019-09-26 DIAGNOSIS — M6281 Muscle weakness (generalized): Secondary | ICD-10-CM | POA: Diagnosis not present

## 2019-09-26 DIAGNOSIS — M2550 Pain in unspecified joint: Secondary | ICD-10-CM | POA: Diagnosis not present

## 2019-09-26 DIAGNOSIS — F411 Generalized anxiety disorder: Secondary | ICD-10-CM | POA: Diagnosis not present

## 2019-09-26 DIAGNOSIS — E785 Hyperlipidemia, unspecified: Secondary | ICD-10-CM | POA: Diagnosis present

## 2019-09-26 DIAGNOSIS — N3281 Overactive bladder: Secondary | ICD-10-CM | POA: Diagnosis present

## 2019-09-26 DIAGNOSIS — Z87891 Personal history of nicotine dependence: Secondary | ICD-10-CM

## 2019-09-26 DIAGNOSIS — M109 Gout, unspecified: Secondary | ICD-10-CM | POA: Diagnosis not present

## 2019-09-26 DIAGNOSIS — F418 Other specified anxiety disorders: Secondary | ICD-10-CM | POA: Diagnosis not present

## 2019-09-26 DIAGNOSIS — F339 Major depressive disorder, recurrent, unspecified: Secondary | ICD-10-CM | POA: Diagnosis not present

## 2019-09-26 DIAGNOSIS — Z7401 Bed confinement status: Secondary | ICD-10-CM | POA: Diagnosis not present

## 2019-09-26 DIAGNOSIS — R278 Other lack of coordination: Secondary | ICD-10-CM | POA: Diagnosis not present

## 2019-09-26 DIAGNOSIS — Z01811 Encounter for preprocedural respiratory examination: Secondary | ICD-10-CM

## 2019-09-26 DIAGNOSIS — E7849 Other hyperlipidemia: Secondary | ICD-10-CM | POA: Diagnosis not present

## 2019-09-26 DIAGNOSIS — R2681 Unsteadiness on feet: Secondary | ICD-10-CM | POA: Diagnosis not present

## 2019-09-26 HISTORY — PX: TOTAL KNEE ARTHROPLASTY: SHX125

## 2019-09-26 LAB — ABO/RH: ABO/RH(D): O POS

## 2019-09-26 IMAGING — DX DG KNEE 1-2V PORT*R*
2 series · 2 of 2 positions shown · non-contrast
Comparison: [DATE].

CLINICAL DATA: Status post right knee replacement.

EXAM:
PORTABLE RIGHT KNEE - 1-2 VIEW

[knee ap]
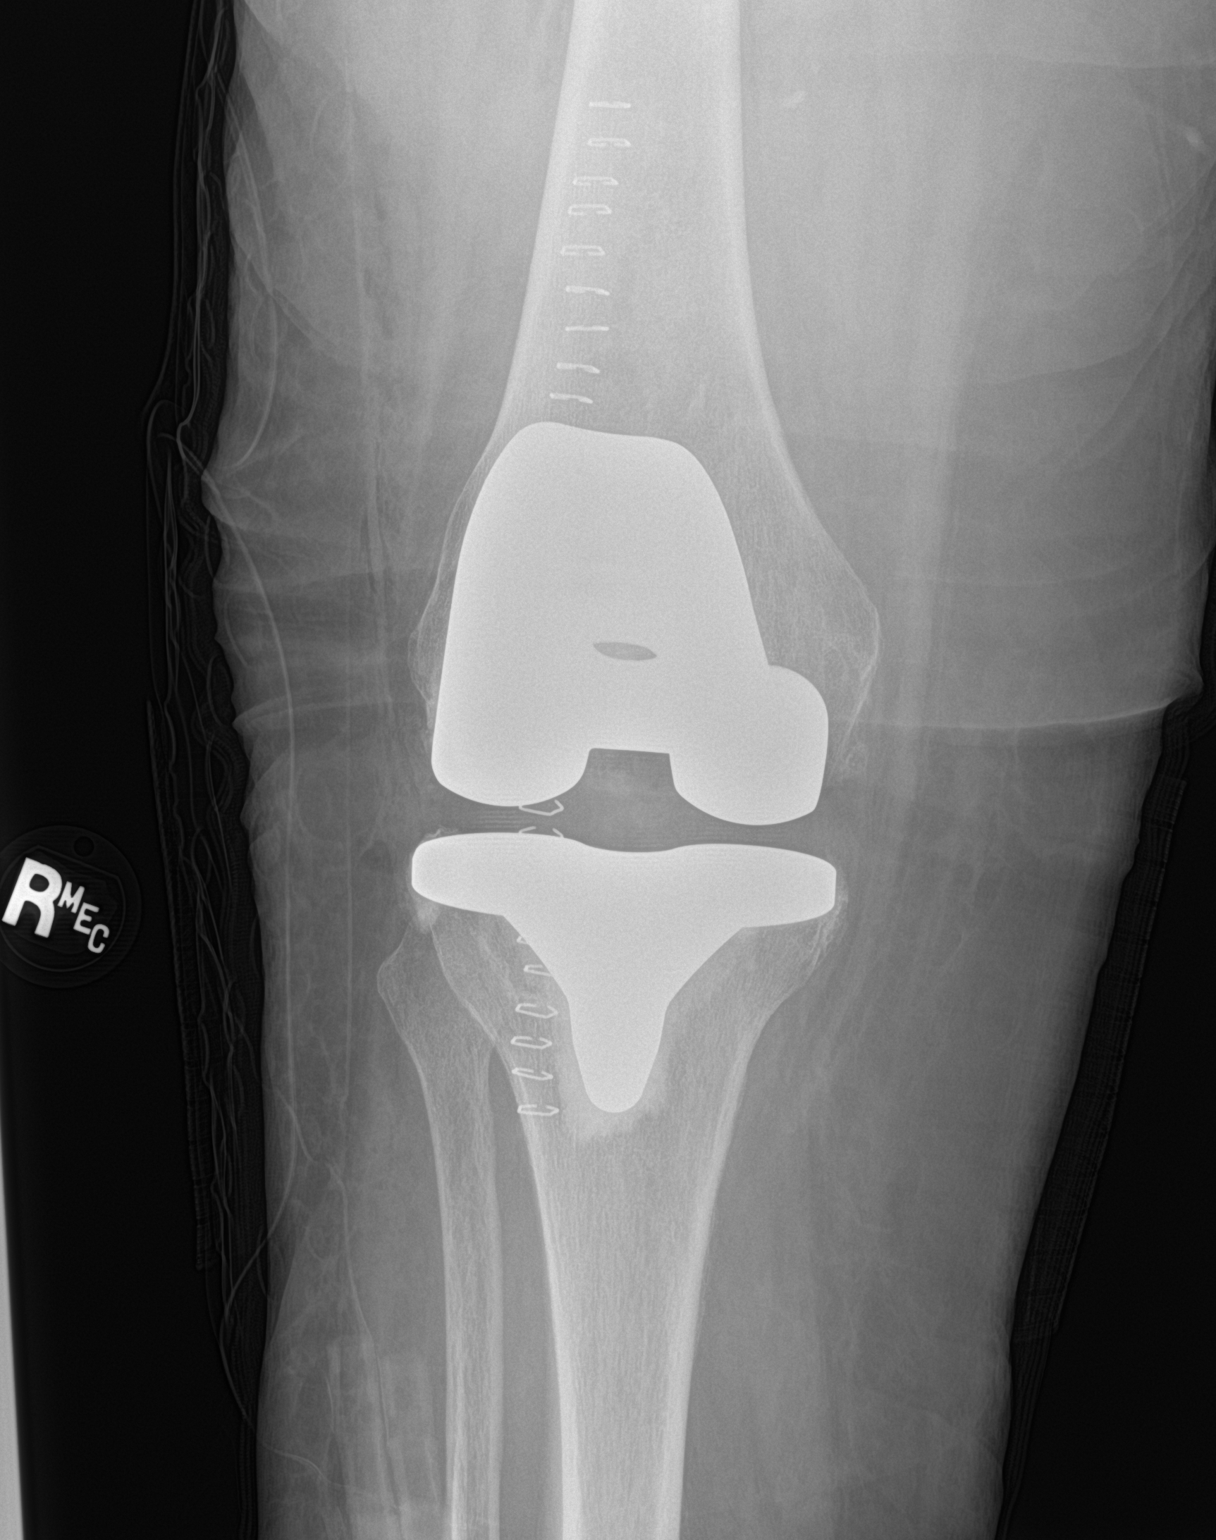

[knee lat]
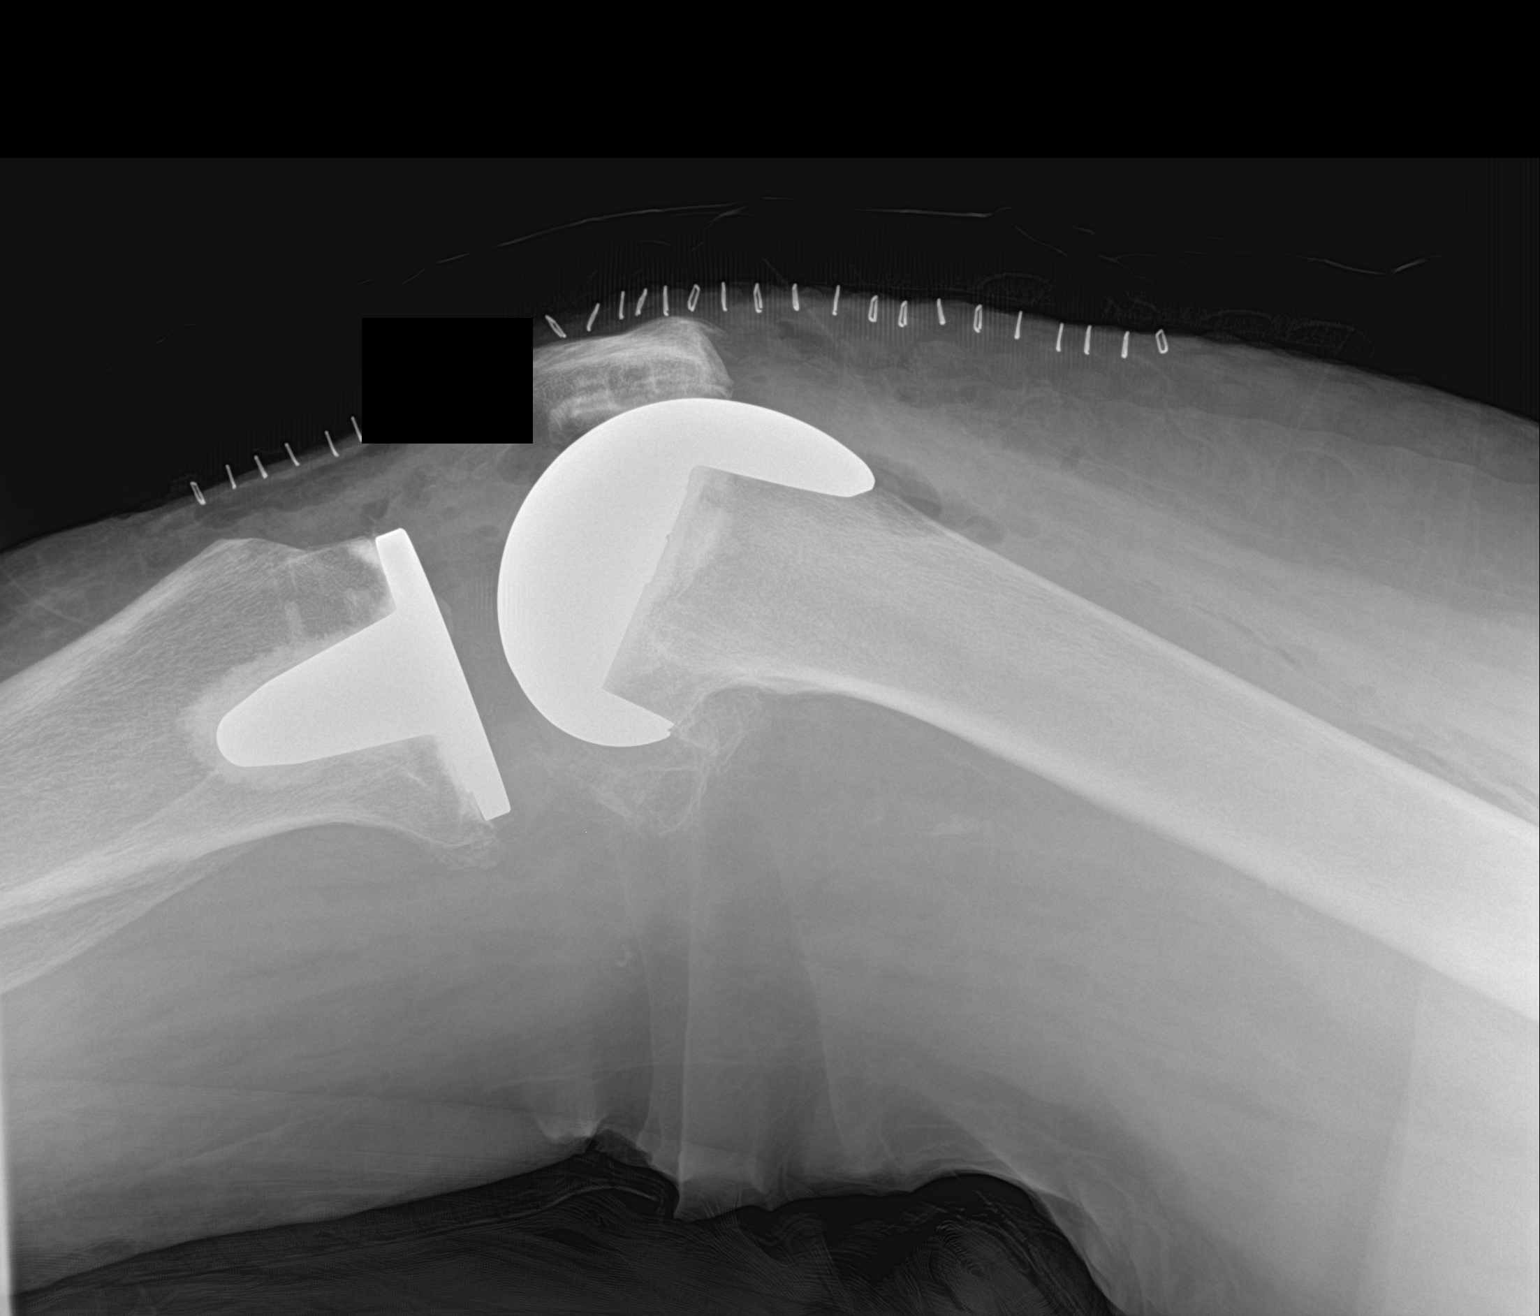

[2 of 2 positions shown; findings below may reference images not displayed]

FINDINGS: The right femoral and tibial components appear to be well situated.
Expected postoperative changes are noted in the soft tissues
anteriorly.
IMPRESSION: Status post right total knee arthroplasty.

## 2019-09-26 SURGERY — ARTHROPLASTY, KNEE, TOTAL
Anesthesia: Spinal | Site: Knee | Laterality: Right

## 2019-09-26 MED ORDER — KETOROLAC TROMETHAMINE 30 MG/ML IJ SOLN
INTRAMUSCULAR | Status: DC | PRN
  Administered 2019-09-26: 30 mg via INTRAVENOUS

## 2019-09-26 MED ORDER — CLINDAMYCIN PHOSPHATE 900 MG/50ML IV SOLN
INTRAVENOUS | Status: AC
  Filled 2019-09-26: qty 50

## 2019-09-26 MED ORDER — METOPROLOL SUCCINATE ER 25 MG PO TB24
100.0000 mg | ORAL_TABLET | Freq: Every day | ORAL | Status: DC
  Administered 2019-09-27 – 2019-09-29 (×4): 100 mg via ORAL
  Filled 2019-09-26 (×3): qty 4

## 2019-09-26 MED ORDER — ADULT MULTIVITAMIN W/MINERALS CH
1.0000 | ORAL_TABLET | Freq: Every day | ORAL | Status: DC
  Administered 2019-09-27 – 2019-09-29 (×3): 1 via ORAL
  Filled 2019-09-26 (×3): qty 1

## 2019-09-26 MED ORDER — HYDRALAZINE HCL 20 MG/ML IJ SOLN
INTRAMUSCULAR | Status: DC | PRN
Start: 2019-09-26 — End: 2019-09-26
  Administered 2019-09-26 (×2): 4 mg via INTRAVENOUS

## 2019-09-26 MED ORDER — CLINDAMYCIN PHOSPHATE 900 MG/50ML IV SOLN: 900.0000 mg | Freq: Once | INTRAVENOUS | Status: DC

## 2019-09-26 MED ORDER — CLINDAMYCIN PHOSPHATE 900 MG/50ML IV SOLN
900.0000 mg | Freq: Once | INTRAVENOUS | Status: AC
  Administered 2019-09-26: 900 mg via INTRAVENOUS

## 2019-09-26 MED ORDER — GABAPENTIN 300 MG PO CAPS
ORAL_CAPSULE | ORAL | Status: AC
  Administered 2019-09-26: 300 mg via ORAL
  Filled 2019-09-26: qty 1

## 2019-09-26 MED ORDER — PRAVASTATIN SODIUM 40 MG PO TABS
40.0000 mg | ORAL_TABLET | Freq: Every day | ORAL | Status: DC
  Administered 2019-09-26 – 2019-09-28 (×3): 40 mg via ORAL
  Filled 2019-09-26: qty 1
  Filled 2019-09-26 (×2): qty 2
  Filled 2019-09-26 (×3): qty 1
  Filled 2019-09-26: qty 2

## 2019-09-26 MED ORDER — TRANEXAMIC ACID-NACL 1000-0.7 MG/100ML-% IV SOLN
1000.0000 mg | INTRAVENOUS | Status: AC
  Administered 2019-09-26: 1000 mg via INTRAVENOUS

## 2019-09-26 MED ORDER — CELECOXIB 200 MG PO CAPS: 200.0000 mg | ORAL_CAPSULE | ORAL | Status: AC

## 2019-09-26 MED ORDER — ONDANSETRON HCL 4 MG/2ML IJ SOLN: 4.0000 mg | Freq: Four times a day (QID) | INTRAMUSCULAR | Status: DC | PRN

## 2019-09-26 MED ORDER — ACETAMINOPHEN 500 MG PO TABS
ORAL_TABLET | ORAL | Status: AC
  Administered 2019-09-26: 1000 mg via ORAL
  Filled 2019-09-26: qty 2

## 2019-09-26 MED ORDER — SODIUM CHLORIDE (PF) 0.9 % IJ SOLN
INTRAMUSCULAR | Status: AC
  Filled 2019-09-26: qty 10

## 2019-09-26 MED ORDER — CEFAZOLIN SODIUM-DEXTROSE 1-4 GM/50ML-% IV SOLN
1.0000 g | Freq: Four times a day (QID) | INTRAVENOUS | Status: AC
  Administered 2019-09-26 (×2): 1 g via INTRAVENOUS
  Filled 2019-09-26: qty 50

## 2019-09-26 MED ORDER — HYDROMORPHONE HCL 1 MG/ML IJ SOLN
INTRAMUSCULAR | Status: DC | PRN
  Administered 2019-09-26: .25 mg via INTRAVENOUS
  Administered 2019-09-26: .5 mg via INTRAVENOUS
  Administered 2019-09-26: .25 mg via INTRAVENOUS

## 2019-09-26 MED ORDER — ACETAMINOPHEN 500 MG PO TABS
1000.0000 mg | ORAL_TABLET | Freq: Four times a day (QID) | ORAL | Status: AC
  Administered 2019-09-26 – 2019-09-27 (×4): 1000 mg via ORAL
  Filled 2019-09-26 (×4): qty 2

## 2019-09-26 MED ORDER — SODIUM CHLORIDE 0.9 % IV SOLN
INTRAVENOUS | Status: DC | PRN
  Administered 2019-09-26: 60 mL

## 2019-09-26 MED ORDER — CEFAZOLIN SODIUM-DEXTROSE 2-4 GM/100ML-% IV SOLN
2.0000 g | INTRAVENOUS | Status: AC
  Administered 2019-09-26: 2 g via INTRAVENOUS

## 2019-09-26 MED ORDER — PROPOFOL 500 MG/50ML IV EMUL
INTRAVENOUS | Status: AC
  Filled 2019-09-26: qty 50

## 2019-09-26 MED ORDER — CEFAZOLIN SODIUM-DEXTROSE 1-4 GM/50ML-% IV SOLN
INTRAVENOUS | Status: AC
  Filled 2019-09-26: qty 50

## 2019-09-26 MED ORDER — ACETAMINOPHEN 500 MG PO TABS: 1000.0000 mg | ORAL_TABLET | ORAL | Status: AC

## 2019-09-26 MED ORDER — TRAMADOL HCL 50 MG PO TABS
50.0000 mg | ORAL_TABLET | Freq: Four times a day (QID) | ORAL | Status: DC
  Administered 2019-09-26 – 2019-09-29 (×13): 50 mg via ORAL
  Filled 2019-09-26 (×12): qty 1

## 2019-09-26 MED ORDER — GABAPENTIN 300 MG PO CAPS: 300.0000 mg | ORAL_CAPSULE | ORAL | Status: DC

## 2019-09-26 MED ORDER — ONDANSETRON HCL 4 MG/2ML IJ SOLN: 4.0000 mg | Freq: Once | INTRAMUSCULAR | Status: DC | PRN

## 2019-09-26 MED ORDER — ONDANSETRON HCL 4 MG PO TABS: 4.0000 mg | ORAL_TABLET | Freq: Four times a day (QID) | ORAL | Status: DC | PRN

## 2019-09-26 MED ORDER — TRANEXAMIC ACID-NACL 1000-0.7 MG/100ML-% IV SOLN
INTRAVENOUS | Status: AC
  Filled 2019-09-26: qty 100

## 2019-09-26 MED ORDER — ACETAMINOPHEN 325 MG PO TABS: 325.0000 mg | ORAL_TABLET | Freq: Four times a day (QID) | ORAL | Status: DC | PRN

## 2019-09-26 MED ORDER — MENTHOL 3 MG MT LOZG
1.0000 | LOZENGE | OROMUCOSAL | Status: DC | PRN
  Filled 2019-09-26: qty 9

## 2019-09-26 MED ORDER — DEXAMETHASONE SODIUM PHOSPHATE 10 MG/ML IJ SOLN
INTRAMUSCULAR | Status: DC | PRN
  Administered 2019-09-26: 5 mg via INTRAVENOUS

## 2019-09-26 MED ORDER — CHLORHEXIDINE GLUCONATE CLOTH 2 % EX PADS: 6.0000 | MEDICATED_PAD | Freq: Once | CUTANEOUS | Status: DC

## 2019-09-26 MED ORDER — ONDANSETRON HCL 4 MG/2ML IJ SOLN
INTRAMUSCULAR | Status: AC
  Filled 2019-09-26: qty 2

## 2019-09-26 MED ORDER — CHLORHEXIDINE GLUCONATE CLOTH 2 % EX PADS
6.0000 | MEDICATED_PAD | Freq: Once | CUTANEOUS | Status: AC
  Administered 2019-09-26: 6 via TOPICAL

## 2019-09-26 MED ORDER — ALUM & MAG HYDROXIDE-SIMETH 200-200-20 MG/5ML PO SUSP: 30.0000 mL | ORAL | Status: DC | PRN

## 2019-09-26 MED ORDER — METHOCARBAMOL 500 MG PO TABS: 500.0000 mg | ORAL_TABLET | Freq: Four times a day (QID) | ORAL | Status: DC | PRN

## 2019-09-26 MED ORDER — ENOXAPARIN SODIUM 40 MG/0.4ML ~~LOC~~ SOLN
40.0000 mg | Freq: Two times a day (BID) | SUBCUTANEOUS | Status: DC
  Administered 2019-09-27 – 2019-09-29 (×5): 40 mg via SUBCUTANEOUS
  Filled 2019-09-26 (×5): qty 0.4

## 2019-09-26 MED ORDER — PROPOFOL 500 MG/50ML IV EMUL: INTRAVENOUS | Status: DC | PRN

## 2019-09-26 MED ORDER — CHLORHEXIDINE GLUCONATE 0.12 % MT SOLN
OROMUCOSAL | Status: AC
  Administered 2019-09-26: 15 mL via OROMUCOSAL
  Filled 2019-09-26: qty 15

## 2019-09-26 MED ORDER — FENTANYL CITRATE (PF) 100 MCG/2ML IJ SOLN: 25.0000 ug | INTRAMUSCULAR | Status: DC | PRN

## 2019-09-26 MED ORDER — GABAPENTIN 300 MG PO CAPS: 300.0000 mg | ORAL_CAPSULE | ORAL | Status: AC

## 2019-09-26 MED ORDER — OXYCODONE HCL 5 MG PO TABS
10.0000 mg | ORAL_TABLET | ORAL | Status: DC | PRN
  Filled 2019-09-26: qty 3
  Filled 2019-09-26: qty 2

## 2019-09-26 MED ORDER — CALCIUM POLYCARBOPHIL 625 MG PO TABS
1250.0000 mg | ORAL_TABLET | Freq: Every day | ORAL | Status: DC
  Administered 2019-09-27 – 2019-09-29 (×3): 1250 mg via ORAL
  Filled 2019-09-26 (×3): qty 2

## 2019-09-26 MED ORDER — BUPIVACAINE-EPINEPHRINE (PF) 0.25% -1:200000 IJ SOLN
INTRAMUSCULAR | Status: AC
  Filled 2019-09-26: qty 30

## 2019-09-26 MED ORDER — MIDAZOLAM HCL 2 MG/2ML IJ SOLN
INTRAMUSCULAR | Status: DC | PRN
  Administered 2019-09-26 (×2): 1 mg via INTRAVENOUS

## 2019-09-26 MED ORDER — SODIUM CHLORIDE 0.9 % IV SOLN: INTRAVENOUS | Status: DC

## 2019-09-26 MED ORDER — HYDROMORPHONE HCL 1 MG/ML IJ SOLN
INTRAMUSCULAR | Status: AC
  Filled 2019-09-26: qty 1

## 2019-09-26 MED ORDER — LIDOCAINE HCL (CARDIAC) PF 100 MG/5ML IV SOSY
PREFILLED_SYRINGE | INTRAVENOUS | Status: DC | PRN
  Administered 2019-09-26: 100 mg via INTRAVENOUS

## 2019-09-26 MED ORDER — PROPOFOL 10 MG/ML IV BOLUS
INTRAVENOUS | Status: AC
  Filled 2019-09-26: qty 20

## 2019-09-26 MED ORDER — ACETAMINOPHEN 500 MG PO TABS: 1000.0000 mg | ORAL_TABLET | ORAL | Status: DC

## 2019-09-26 MED ORDER — TRAMADOL HCL 50 MG PO TABS
ORAL_TABLET | ORAL | Status: AC
  Administered 2019-09-26: 50 mg
  Filled 2019-09-26: qty 1

## 2019-09-26 MED ORDER — TRAZODONE HCL 100 MG PO TABS
100.0000 mg | ORAL_TABLET | Freq: Every day | ORAL | Status: DC
  Administered 2019-09-26 – 2019-09-28 (×3): 100 mg via ORAL
  Filled 2019-09-26 (×3): qty 1

## 2019-09-26 MED ORDER — VENLAFAXINE HCL ER 150 MG PO CP24
150.0000 mg | ORAL_CAPSULE | Freq: Every day | ORAL | Status: DC
  Administered 2019-09-27 – 2019-09-29 (×3): 150 mg via ORAL
  Filled 2019-09-26 (×4): qty 1

## 2019-09-26 MED ORDER — CHLORHEXIDINE GLUCONATE 0.12 % MT SOLN: 15.0000 mL | Freq: Once | OROMUCOSAL | Status: AC

## 2019-09-26 MED ORDER — CELECOXIB 200 MG PO CAPS
ORAL_CAPSULE | ORAL | Status: AC
  Administered 2019-09-26: 200 mg via ORAL
  Filled 2019-09-26: qty 1

## 2019-09-26 MED ORDER — LIDOCAINE HCL (PF) 2 % IJ SOLN
INTRAMUSCULAR | Status: AC
  Filled 2019-09-26: qty 5

## 2019-09-26 MED ORDER — LEVOTHYROXINE SODIUM 112 MCG PO TABS
112.0000 ug | ORAL_TABLET | Freq: Every day | ORAL | Status: DC
  Administered 2019-09-27 – 2019-09-29 (×3): 112 ug via ORAL
  Filled 2019-09-26 (×4): qty 1

## 2019-09-26 MED ORDER — BUPIVACAINE-EPINEPHRINE 0.25% -1:200000 IJ SOLN
INTRAMUSCULAR | Status: DC | PRN
  Administered 2019-09-26: 60 mL

## 2019-09-26 MED ORDER — FLEET ENEMA 7-19 GM/118ML RE ENEM: 1.0000 | ENEMA | Freq: Once | RECTAL | Status: DC | PRN

## 2019-09-26 MED ORDER — ROCURONIUM BROMIDE 100 MG/10ML IV SOLN
INTRAVENOUS | Status: DC | PRN
  Administered 2019-09-26: 60 mg via INTRAVENOUS

## 2019-09-26 MED ORDER — SENNOSIDES-DOCUSATE SODIUM 8.6-50 MG PO TABS
1.0000 | ORAL_TABLET | Freq: Every evening | ORAL | Status: DC | PRN
  Administered 2019-09-28: 1 via ORAL
  Filled 2019-09-26: qty 1

## 2019-09-26 MED ORDER — NEOMYCIN-POLYMYXIN B GU 40-200000 IR SOLN
Status: DC | PRN
  Administered 2019-09-26: 16 mL

## 2019-09-26 MED ORDER — CEFAZOLIN SODIUM-DEXTROSE 2-4 GM/100ML-% IV SOLN: 2.0000 g | INTRAVENOUS | Status: DC

## 2019-09-26 MED ORDER — DOCUSATE SODIUM 100 MG PO CAPS
100.0000 mg | ORAL_CAPSULE | Freq: Two times a day (BID) | ORAL | Status: DC
  Administered 2019-09-26 – 2019-09-29 (×5): 100 mg via ORAL
  Filled 2019-09-26 (×6): qty 1

## 2019-09-26 MED ORDER — SUGAMMADEX SODIUM 200 MG/2ML IV SOLN
INTRAVENOUS | Status: DC | PRN
  Administered 2019-09-26: 100 mg via INTRAVENOUS

## 2019-09-26 MED ORDER — SODIUM CHLORIDE FLUSH 0.9 % IV SOLN
INTRAVENOUS | Status: AC
  Filled 2019-09-26: qty 40

## 2019-09-26 MED ORDER — MORPHINE SULFATE 4 MG/ML IJ SOLN
INTRAMUSCULAR | Status: DC | PRN
  Administered 2019-09-26: 4 mg via INTRAVENOUS

## 2019-09-26 MED ORDER — CARBOXYMETHYLCELLULOSE SODIUM 0.25 % OP SOLN: Freq: Every day | OPHTHALMIC | Status: DC | PRN

## 2019-09-26 MED ORDER — POLYVINYL ALCOHOL 1.4 % OP SOLN
1.0000 [drp] | OPHTHALMIC | Status: DC | PRN
  Filled 2019-09-26: qty 15

## 2019-09-26 MED ORDER — PHENOL 1.4 % MT LIQD
1.0000 | OROMUCOSAL | Status: DC | PRN
  Filled 2019-09-26: qty 177

## 2019-09-26 MED ORDER — OXYBUTYNIN CHLORIDE ER 10 MG PO TB24
10.0000 mg | ORAL_TABLET | Freq: Every day | ORAL | Status: DC
  Administered 2019-09-26 – 2019-09-28 (×3): 10 mg via ORAL
  Filled 2019-09-26 (×4): qty 1

## 2019-09-26 MED ORDER — LACTATED RINGERS IV SOLN
INTRAVENOUS | Status: DC
  Administered 2019-09-26: 10 mL/h via INTRAVENOUS

## 2019-09-26 MED ORDER — DIPHENHYDRAMINE HCL 12.5 MG/5ML PO ELIX: 12.5000 mg | ORAL_SOLUTION | ORAL | Status: DC | PRN

## 2019-09-26 MED ORDER — METHOCARBAMOL 1000 MG/10ML IJ SOLN
500.0000 mg | Freq: Four times a day (QID) | INTRAVENOUS | Status: DC | PRN
  Filled 2019-09-26: qty 5

## 2019-09-26 MED ORDER — GABAPENTIN 300 MG PO CAPS
300.0000 mg | ORAL_CAPSULE | Freq: Three times a day (TID) | ORAL | Status: DC
  Administered 2019-09-26 – 2019-09-29 (×9): 300 mg via ORAL
  Filled 2019-09-26 (×9): qty 1

## 2019-09-26 MED ORDER — PROPOFOL 10 MG/ML IV BOLUS
INTRAVENOUS | Status: DC | PRN
  Administered 2019-09-26: 200 mg via INTRAVENOUS

## 2019-09-26 MED ORDER — ORAL CARE MOUTH RINSE: 15.0000 mL | Freq: Once | OROMUCOSAL | Status: AC

## 2019-09-26 MED ORDER — ONDANSETRON HCL 4 MG/2ML IJ SOLN
INTRAMUSCULAR | Status: DC | PRN
  Administered 2019-09-26: 4 mg via INTRAVENOUS

## 2019-09-26 MED ORDER — HYDROMORPHONE HCL 1 MG/ML IJ SOLN: 0.5000 mg | INTRAMUSCULAR | Status: DC | PRN

## 2019-09-26 MED ORDER — CELECOXIB 200 MG PO CAPS: 200.0000 mg | ORAL_CAPSULE | ORAL | Status: DC

## 2019-09-26 MED ORDER — CEFAZOLIN SODIUM-DEXTROSE 2-4 GM/100ML-% IV SOLN
INTRAVENOUS | Status: AC
  Filled 2019-09-26: qty 100

## 2019-09-26 MED ORDER — FENTANYL CITRATE (PF) 100 MCG/2ML IJ SOLN
INTRAMUSCULAR | Status: DC | PRN
  Administered 2019-09-26: 100 ug via INTRAVENOUS
  Administered 2019-09-26 (×3): 50 ug via INTRAVENOUS

## 2019-09-26 MED ORDER — OXYCODONE HCL 5 MG PO TABS
5.0000 mg | ORAL_TABLET | ORAL | Status: DC | PRN
  Administered 2019-09-26: 10 mg via ORAL
  Administered 2019-09-27: 5 mg via ORAL
  Filled 2019-09-26: qty 2
  Filled 2019-09-26: qty 1

## 2019-09-26 MED ORDER — DEXAMETHASONE SODIUM PHOSPHATE 10 MG/ML IJ SOLN
INTRAMUSCULAR | Status: AC
  Filled 2019-09-26: qty 1

## 2019-09-26 MED ORDER — HYDRALAZINE HCL 20 MG/ML IJ SOLN
INTRAMUSCULAR | Status: AC
  Filled 2019-09-26: qty 1

## 2019-09-26 MED ORDER — FENTANYL CITRATE (PF) 250 MCG/5ML IJ SOLN
INTRAMUSCULAR | Status: AC
  Filled 2019-09-26: qty 5

## 2019-09-26 MED ORDER — MIDAZOLAM HCL 2 MG/2ML IJ SOLN
INTRAMUSCULAR | Status: AC
  Filled 2019-09-26: qty 2

## 2019-09-26 MED ORDER — ROCURONIUM BROMIDE 10 MG/ML (PF) SYRINGE
PREFILLED_SYRINGE | INTRAVENOUS | Status: AC
  Filled 2019-09-26: qty 10

## 2019-09-26 MED ORDER — BUPIVACAINE LIPOSOME 1.3 % IJ SUSP
INTRAMUSCULAR | Status: AC
  Filled 2019-09-26: qty 20

## 2019-09-26 MED ORDER — MORPHINE SULFATE (PF) 4 MG/ML IV SOLN
INTRAVENOUS | Status: AC
  Filled 2019-09-26: qty 1

## 2019-09-26 MED ORDER — PANTOPRAZOLE SODIUM 40 MG PO TBEC
40.0000 mg | DELAYED_RELEASE_TABLET | Freq: Every day | ORAL | Status: DC
  Administered 2019-09-27 – 2019-09-29 (×3): 40 mg via ORAL
  Filled 2019-09-26 (×3): qty 1

## 2019-09-26 MED ORDER — BISACODYL 10 MG RE SUPP
10.0000 mg | Freq: Every day | RECTAL | Status: DC | PRN
  Administered 2019-09-28: 10 mg via RECTAL
  Filled 2019-09-26: qty 1

## 2019-09-26 SURGICAL SUPPLY — 76 items
BLADE SAW 90X13X1.19 OSCILLAT (BLADE) ×3 IMPLANT
BLADE SAW 90X25X1.19 OSCILLAT (BLADE) ×3 IMPLANT
CANISTER SUCT 3000ML PPV (MISCELLANEOUS) ×3 IMPLANT
CEMENT HV SMART SET (Cement) ×6 IMPLANT
CEMENT TIBIA MBT SIZE 4 (Knees) IMPLANT
CNTNR SPEC 2.5X3XGRAD LEK (MISCELLANEOUS) ×1
CONT SPEC 4OZ STER OR WHT (MISCELLANEOUS) ×2
CONT SPEC 4OZ STRL OR WHT (MISCELLANEOUS) ×1
CONTAINER SPEC 2.5X3XGRAD LEK (MISCELLANEOUS) ×1 IMPLANT
COOLER POLAR GLACIER W/PUMP (MISCELLANEOUS) ×3 IMPLANT
COVER WAND RF STERILE (DRAPES) ×3 IMPLANT
CUFF TOURN SGL QUICK 24 (TOURNIQUET CUFF)
CUFF TOURN SGL QUICK 30 (TOURNIQUET CUFF)
CUFF TRNQT CYL 24X4X16.5-23 (TOURNIQUET CUFF) IMPLANT
CUFF TRNQT CYL 30X4X21-28X (TOURNIQUET CUFF) IMPLANT
DRAPE 3/4 80X56 (DRAPES) ×6 IMPLANT
DRAPE IMP U-DRAPE 54X76 (DRAPES) ×6 IMPLANT
DRAPE INCISE IOBAN 66X45 STRL (DRAPES) ×2 IMPLANT
DRAPE INCISE IOBAN 66X60 STRL (DRAPES) ×3 IMPLANT
DRAPE SURG 17X11 SM STRL (DRAPES) ×6 IMPLANT
DRSG OPSITE POSTOP 4X12 (GAUZE/BANDAGES/DRESSINGS) ×3 IMPLANT
DRSG OPSITE POSTOP 4X14 (GAUZE/BANDAGES/DRESSINGS) ×3 IMPLANT
DURAPREP 26ML APPLICATOR (WOUND CARE) ×9 IMPLANT
ELECT REM PT RETURN 9FT ADLT (ELECTROSURGICAL) ×3
ELECTRODE REM PT RTRN 9FT ADLT (ELECTROSURGICAL) ×1 IMPLANT
FEMUR SIGMA PS SZ 3.0 R (Femur) ×2 IMPLANT
GAUZE SPONGE 4X4 12PLY STRL (GAUZE/BANDAGES/DRESSINGS) ×3 IMPLANT
GLOVE BIO SURGEON STRL SZ7.5 (GLOVE) ×6 IMPLANT
GLOVE BIOGEL PI IND STRL 8 (GLOVE) ×1 IMPLANT
GLOVE BIOGEL PI IND STRL 9 (GLOVE) ×1 IMPLANT
GLOVE BIOGEL PI INDICATOR 8 (GLOVE) ×2
GLOVE BIOGEL PI INDICATOR 9 (GLOVE) ×2
GLOVE SURG 9.0 ORTHO LTXF (GLOVE) ×6 IMPLANT
GOWN STRL REUS TWL 2XL XL LVL4 (GOWN DISPOSABLE) ×3 IMPLANT
GOWN STRL REUS W/ TWL LRG LVL3 (GOWN DISPOSABLE) ×1 IMPLANT
GOWN STRL REUS W/ TWL LRG LVL4 (GOWN DISPOSABLE) ×1 IMPLANT
GOWN STRL REUS W/TWL LRG LVL3 (GOWN DISPOSABLE) ×3
GOWN STRL REUS W/TWL LRG LVL4 (GOWN DISPOSABLE) ×3
HOLDER FOLEY CATH W/STRAP (MISCELLANEOUS) ×3 IMPLANT
IMMBOLIZER KNEE 19 BLUE UNIV (SOFTGOODS) ×3 IMPLANT
KIT TURNOVER KIT A (KITS) ×3 IMPLANT
MANIFOLD NEPTUNE II (INSTRUMENTS) ×3 IMPLANT
MARKER SKIN DUAL TIP RULER LAB (MISCELLANEOUS) ×2 IMPLANT
NDL SAFETY ECLIPSE 18X1.5 (NEEDLE) ×1 IMPLANT
NDL SPNL 20GX3.5 QUINCKE YW (NEEDLE) ×1 IMPLANT
NEEDLE HYPO 18GX1.5 SHARP (NEEDLE) ×3
NEEDLE HYPO 22GX1.5 SAFETY (NEEDLE) ×3 IMPLANT
NEEDLE SPNL 20GX3.5 QUINCKE YW (NEEDLE) ×3 IMPLANT
NS IRRIG 1000ML POUR BTL (IV SOLUTION) ×7 IMPLANT
PACK TOTAL KNEE (MISCELLANEOUS) ×3 IMPLANT
PAD WRAPON POLOR MULTI XL (MISCELLANEOUS) IMPLANT
PATELLA DOME PFC 35MM (Knees) ×2 IMPLANT
PENCIL SMOKE ULTRAEVAC 22 CON (MISCELLANEOUS) ×3 IMPLANT
PIN FIXATION 1/8DIA X 3INL (PIN) ×2 IMPLANT
PLATE ROT INSERT 12.5MM SIZE 3 (Plate) ×2 IMPLANT
PULSAVAC PLUS IRRIG FAN TIP (DISPOSABLE) ×3
SOL .9 NS 3000ML IRR  AL (IV SOLUTION) ×3
SOL .9 NS 3000ML IRR AL (IV SOLUTION) ×1
SOL .9 NS 3000ML IRR UROMATIC (IV SOLUTION) ×1 IMPLANT
SPONGE LAP 18X18 RF (DISPOSABLE) IMPLANT
STAPLER SKIN PROX 35W (STAPLE) ×3 IMPLANT
SUCTION FRAZIER HANDLE 10FR (MISCELLANEOUS) ×3
SUCTION TUBE FRAZIER 10FR DISP (MISCELLANEOUS) ×1 IMPLANT
SUT ETHIBOND NAB CT1 #1 30IN (SUTURE) ×4 IMPLANT
SUT VIC AB 0 CT1 36 (SUTURE) ×3 IMPLANT
SUT VIC AB 2-0 CT1 (SUTURE) ×6 IMPLANT
SYR 20ML LL LF (SYRINGE) ×3 IMPLANT
SYR 30ML LL (SYRINGE) ×6 IMPLANT
SYR BULB IRRIG 60ML STRL (SYRINGE) ×2 IMPLANT
TIBIA MBT CEMENT SIZE 4 (Knees) ×3 IMPLANT
TIP FAN IRRIG PULSAVAC PLUS (DISPOSABLE) ×1 IMPLANT
TOWER CARTRIDGE SMART MIX (DISPOSABLE) ×3 IMPLANT
TRAY FOLEY MTR SLVR 16FR STAT (SET/KITS/TRAYS/PACK) ×3 IMPLANT
TUBE SUCT KAM VAC (TUBING) ×3 IMPLANT
WRAP-ON POLOR PAD MULTI XL (MISCELLANEOUS) ×1
WRAPON POLOR PAD MULTI XL (MISCELLANEOUS) ×3

## 2019-09-26 NOTE — H&P (Signed)
PREOPERATIVE H&P  Chief Complaint: Unilateral Primary Osteoarthritis, Right Knee  HPI: Robin Espinoza is a 74 y.o. female who presents for preoperative history and physical with a diagnosis of Unilateral Primary Osteoarthritis, Right Knee. Symptoms of pain, swelling and limitation of motion are significantly impairing activities of daily living.  X-rays have demonstrated bone-on-bone contact in the medial compartment, subchondral sclerosis, and marginal osteophytes.  Patient has failed nonoperative management and wishes to proceed with a right total knee arthroplasty.    Past Medical History:  Diagnosis Date   Anxiety    Depression    Family history of adverse reaction to anesthesia    sister had a reaction when had a baby   GERD (gastroesophageal reflux disease)    Hyperlipidemia 12/21/2005   Hypertension    Hypothyroidism    Insomnia    OAB (overactive bladder)    Obstructive sleep apnea    Osteoarthrosis    Vitamin D deficiency    Past Surgical History:  Procedure Laterality Date   COLONOSCOPY     X 2   Cortisone injections in back     EYE SURGERY     TUBAL LIGATION  1981   Social History   Socioeconomic History   Marital status: Married    Spouse name: Not on file   Number of children: 3   Years of education: Not on file   Highest education level: Master's degree (e.g., MA, MS, MEng, MEd, MSW, MBA)  Occupational History   Occupation: retired  Tobacco Use   Smoking status: Former Smoker   Smokeless tobacco: Never Used   Tobacco comment: in Charity fundraiser Use: Never used  Substance and Sexual Activity   Alcohol use: Not Currently    Comment: rarely 1 drink   Drug use: No   Sexual activity: Not on file  Other Topics Concern   Not on file  Social History Narrative   Not on file   Social Determinants of Health   Financial Resource Strain: Low Risk    Difficulty of Paying Living Expenses: Not hard at all  Food  Insecurity: No Food Insecurity   Worried About Charity fundraiser in the Last Year: Never true   Arboriculturist in the Last Year: Never true  Transportation Needs: No Transportation Needs   Lack of Transportation (Medical): No   Lack of Transportation (Non-Medical): No  Physical Activity: Inactive   Days of Exercise per Week: 0 days   Minutes of Exercise per Session: 0 min  Stress: Stress Concern Present   Feeling of Stress : To some extent  Social Connections: Engineer, building services of Communication with Friends and Family: More than three times a week   Frequency of Social Gatherings with Friends and Family: More than three times a week   Attends Religious Services: More than 4 times per year   Active Member of [employer] or Organizations: Yes   Attends Music therapist: More than 4 times per year   Marital Status: Married   Family History  Problem Relation Age of Onset   Diabetes Father    Breast cancer Neg Hx    Allergies  Allergen Reactions   Amlodipine     Ankle swelling    Prior to Admission medications   Medication Sig Start Date End Date Taking? Authorizing Provider  acetaminophen (TYLENOL) 650 MG CR tablet Take 1,300 mg by mouth every 8 (eight) hours as needed for pain.  Yes [provider]  Calcium Polycarbophil (FIBER-CAPS PO) Take 2 capsules by mouth daily.   Yes [provider]  Carboxymethylcellulose Sodium (THERATEARS OP) Place 1 drop into both eyes daily as needed (dry eyes).   Yes [provider]  clotrimazole-betamethasone (LOTRISONE) cream Apply 1 application topically 2 (two) times daily. Patient taking differently: Apply 1 application topically 2 (two) times daily as needed (rash).  11/10/18  Yes Mar Daring, PA-C  etodolac (LODINE) 400 MG tablet Take 400 mg by mouth 2 (two) times daily.   Yes [provider]  levothyroxine (SYNTHROID) 112 MCG tablet TAKE 1 TABLET (112 MCG TOTAL)  BY MOUTH DAILY BEFORE BREAKFAST. Patient taking differently: Take 112 mcg by mouth daily before breakfast.  05/12/19  Yes Burnette, Clearnce Sorrel, PA-C  lovastatin (MEVACOR) 40 MG tablet TAKE 1 TABLET BY MOUTH EVERYDAY AT BEDTIME Patient taking differently: Take 40 mg by mouth at bedtime.  05/21/19  Yes Mar Daring, PA-C  metoprolol succinate (TOPROL-XL) 100 MG 24 hr tablet TAKE 1 TABLET (100 MG TOTAL) BY MOUTH DAILY. TAKE WITH OR IMMEDIATELY FOLLOWING A MEAL. 06/18/19  Yes Mar Daring, PA-C  Multiple Vitamin (MULTIVITAMIN) capsule Take 1 capsule by mouth daily.   Yes [provider]  NON FORMULARY CPAP 08/04/06  Yes [provider]  omeprazole (PRILOSEC) 20 MG capsule TAKE 1 CAPSULE BY MOUTH EVERY DAY Patient taking differently: Take 20 mg by mouth daily.  06/23/19  Yes Burnette, Clearnce Sorrel, PA-C  OVER THE COUNTER MEDICATION Take 2 tablets by mouth daily. Heal and soothe otc supplement   Yes [provider]  oxybutynin (DITROPAN-XL) 10 MG 24 hr tablet TAKE 1 TABLET BY MOUTH EVERYDAY AT BEDTIME Patient taking differently: Take 10 mg by mouth at bedtime.  08/08/19  Yes Trinna Post, PA-C  traZODone (DESYREL) 50 MG tablet Take 2 tablets (100 mg total) by mouth daily. Patient taking differently: Take 100 mg by mouth at bedtime.  04/03/19  Yes Mar Daring, PA-C  venlafaxine XR (EFFEXOR-XR) 75 MG 24 hr capsule TAKE 2 CAPSULES (150 MG TOTAL) BY MOUTH DAILY WITH BREAKFAST. 05/21/19  Yes Burnette, Clearnce Sorrel, PA-C  Vitamin D, Ergocalciferol, (DRISDOL) 1.25 MG (50000 UNIT) CAPS capsule TAKE 1 CAPSULE (50,000 UNITS TOTAL) BY MOUTH EVERY 7 (SEVEN) DAYS. Patient taking differently: Take 50,000 Units by mouth every Sunday.  09/11/19  Yes [name] M, PA-C  amLODipine (NORVASC) 5 MG tablet TAKE 1 TABLET BY MOUTH EVERY DAY Patient not taking: Reported on 09/13/2019 08/10/19   Mar Daring, PA-C  phentermine (ADIPEX-P) 37.5 MG tablet Take 1 tablet (37.5  mg total) by mouth daily before breakfast. Patient not taking: Reported on 08/22/2019 04/03/19   Mar Daring, PA-C     Positive ROS: All other systems have been reviewed and were otherwise negative with the exception of those mentioned in the HPI and as above.  Physical Exam: General: Alert, no acute distress Cardiovascular: Regular rate and rhythm, no murmurs rubs or gallops.  No pedal edema Respiratory: Clear to auscultation bilaterally, no wheezes rales or rhonchi. No cyanosis, no use of accessory musculature GI: No organomegaly, abdomen is soft and non-tender nondistended with positive bowel sounds. Skin: Skin intact, no lesions within the operative field. Neurologic: Sensation intact distally Psychiatric: Patient is competent for consent with normal mood and affect Lymphatic: No cervical lymphadenopathy  MUSCULOSKELETAL: Right knee: Patient skin is intact.  There is tenderness over the medial joint line.  Patient has no ligamentous  laxity.  She has no calf tenderness or lower leg edema.  She has palpable pedal pulses, intact sensation light touch and intact motor function distally.  Her range of motion is from 0 to 110 degrees.  Assessment: Unilateral Primary Osteoarthritis, Right Knee  Plan: Plan for Procedure(s): RIGHT TOTAL KNEE ARTHROPLASTY  I reviewed the details of the operation as well as the postoperative course with the patient.  I discussed the risks and benefits of surgery. The risks include but are not limited to infection, bleeding requiring blood transfusion, nerve or blood vessel injury, joint stiffness or loss of motion, persistent pain, weakness or instability, hardware failure and the need for further surgery including revision total knee arthroplasty. Medical risks include but are not limited to DVT and pulmonary embolism, myocardial infarction, stroke, pneumonia, respiratory failure and death. Patient understood these risks and wished to proceed.      Thornton Park, MD   09/26/2019 7:42 AM

## 2019-09-26 NOTE — TOC Progression Note (Signed)
Transition of Care [facility]) - Progression Note    Patient Details  Name: Robin Espinoza MRN: [id number] Date of Birth: 05/21/1945  Transition of Care [facility]) CM/SW Contact  Anselm Pancoast, RN Phone Number: 09/26/2019, 4:01 PM  Clinical Narrative:    Hulen Skains and spoke with spouse, Fritz Pickerel, regarding  Verbal Code 44. Fritz Pickerel had no questions or concerns and states they had prearranged for patient to go to [facility] for rehab and he wanted to know if that was all set up. Writer confirmed RN CM was already working on placement and would be in contact with patient and spouse soon. No other needs or concerns at this time.         Expected Discharge Plan and Services                                                 Social Determinants of Health (SDOH) Interventions    Readmission Risk Interventions No flowsheet data found.

## 2019-09-26 NOTE — Anesthesia Procedure Notes (Addendum)
Spinal  Patient location during procedure: OR Start time: 09/26/2019 7:55 AM End time: 09/26/2019 8:28 AM Staffing Performed: anesthesiologist  Preanesthetic Checklist Completed: patient identified, IV checked, site marked, risks and benefits discussed, surgical consent, monitors and equipment checked, pre-op evaluation and timeout performed Spinal Block Patient position: sitting Prep: Betadine Patient monitoring: heart rate, continuous pulse ox, blood pressure and cardiac monitor Approach: midline Location: L4-5 Injection technique: single-shot Needle Needle type: Whitacre and Introducer  Needle gauge: 24 G Needle length: 9 cm Additional Notes After numerous attempts, spinal attempts were stopped and pt given GOT.

## 2019-09-26 NOTE — Op Note (Signed)
DATE OF SURGERY:  09/26/2019 TIME: 11:50 AM  PATIENT NAME:  Robin Espinoza   AGE: 74 y.o.    PRE-OPERATIVE DIAGNOSIS:  Unilateral Primary Osteoarthritis, Right Knee  POST-OPERATIVE DIAGNOSIS:  Same  PROCEDURE:  Procedure(s): RIGHT TOTAL KNEE ARTHROPLASTY  SURGEON:  Thornton Park, MD   ASSISTANT:  Tessa Lerner, PA  OPERATIVE IMPLANTS: Depuy PFC Sigma, Posterior Stabilized Femural component size 3, Tibia size rotating platform component size 4, Patella polyethylene 3-peg oval button size 35, with a 12.5 mm polyethylene insert.  EBL:  150 cc  TOURNIQUET TIME:  110 minutes  PREOPERATIVE INDICATIONS:  Robin Espinoza is an 74 y.o. female who has a diagnosis of Unilateral Primary Osteoarthritis, Right Knee and elected for a right total knee arthroplasty after failing nonoperative treatment.  Her right knee pain significantly impacts her activity of daily living.  Radiographs have demonstrated tricompartmental osteoarthritis joint space narrowing, osteophytes, and subchondral sclerosis.  The risks, benefits, and alternatives were discussed at length including but not limited to the risks of infection, bleeding, nerve or blood vessel injury, knee stiffness, fracture, dislocation, loosening or failure of the hardware and the need for further surgery. Medical risks include but not limited to DVT and pulmonary embolism, myocardial infarction, stroke, pneumonia, respiratory failure and death. I discussed these risks with the patient in my office prior to the date of surgery. They understood these risks and were willing to proceed.  OPERATIVE FINDINGS AND UNIQUE ASPECTS OF THE CASE: Tricompartmental osteoarthritis of the right knee  OPERATIVE DESCRIPTION:  The patient was brought to the operative room and placed in a supine position after undergoing general anesthesia after attempts to place a spinal anesthetic was unsuccessful.  A Foley catheter was placed.  IV antibiotics were given including 2  g of Ancef and 900 mg IV of clindamycin.  The right lower extremity was prepped and draped in the usual sterile fashion.  A time out was performed to verify the patient's name, date of birth, medical record number, correct site of surgery and correct procedure to be performed. The timeout was also used to confirm the patient received antibiotics and that appropriate instruments, implants and radiographs studies were available in the room.  The leg was elevated and exsanguinated with an Esmarch and the tourniquet was inflated to 343mmHg mmHg for 110 minutes..  A midline incision was made over the right knee. Full-thickness skin flaps were developed. A medial parapatellar arthrotomy was then made and the patella everted and the knee was brought into 90 of flexion. Hoffa's fat pad along with the cruciate ligaments and medial and lateral menisci were resected.   The distal femoral intramedullary canal was opened with a drill and the intramedullary distal femoral cutting jig was inserted into the femoral canal pinned into position. It was set at 5 degrees resecting 10 mm off the distal femur.  Care was taken to protect the collateral ligaments during distal femoral resection.  The distal femoral resection was performed with an oscillating saw. The femoral cutting guide was then removed.  The extramedullary tibial cutting guide was then placed using the anterior tibial crest and second ray of the foot as a references.  The tibial cutting guide was adjusted to allow for appropriate posterior slope.  The tibial cutting block was pinned into position. The slotted stylus was used to measure the proximal tibial resection of 10 mm off the high lateral side.  The tibial long rod alignment guide was then used to confirm position of the  cutting block. A third cross pin through the tibial cutting block was then drilled into position to allow for rotational stability. Care was taken during the tibial resection to protect the  medial and collateral ligaments.  The resected tibial bone was removed along with the posterior horns of the menisci.  The PCL was sacrificed.  Extension gap was measured with a spacer block and alignment and extension was confirmed using a long alignment rod.  The attention was then turned back to the femur. The posterior referencing distal femoral sizing guide was applied to the distal femur.  The femur was sized to be a size 3. Rotation of the referencing guide was checked with the epicondylar axis and Whitesides line. Then the 4-in-1 cutting jig was then applied to the distal femur. A stylus was used to confirm that the anterior femur would not be notched.   Then the anterior, posterior and chamfer femoral cuts were then made with an oscillating saw.  The flexion gap was then measured with a flexion spacer block and long alignment rod and was found to be symmetric with the extension gap and perpendicular to mechanical axis of the tibia.  The distal femoral preparation was completed by performing the posterior stabilized box cut using the cutting block. The entry site for the intramedullary femoral guide was filled with autologous bone graft from bone.  The proximal tibia plateau was then sized with trial trays. The best coverage was achieved with a size 4. This tibial tray was then pinned into position. The proximal tibia was then prepared with the reamer and keel punch.  After tibial preparation was completed, all trial components were inserted with polyethylene trials.  The knee was found to have excellent balance and full motion with a size 10 mm tibial polyethylene insert..    The attention was then turned to preparation of the patella. The thickness of the patella was measured with a caliper, the diameter measured with the patella templates.  The patella resection was then made with an oscillating saw using the patella cutting guide.  3 peg holes for the patella component were then drilled. The  trial patella was then placed. Knee was taken through a full range of motion and deemed to be stable with the trial components. All trial components were then removed. The knee capsule was then injected with Exparel.  The knee joint capsule was also injected with a mixture of quarter percent Marcaine, Toradol and morphine to assist with postoperative pain relief.  The joint was copiously irrigated with pulse lavage.  The final total knee arthroplasty components were then cemented into place with a 10 mm trial polyethylene insert and all excess methylmethacrylate was removed.  The joint was again copiously irrigated. After the cement had hardened the knee was again taken through a full range of motion. It was felt to be most stable with the 12.5 mm tibial polyethylene insert. The actual tibial polyethylene insert was then placed.   The knee was taken through a range of motion and the patella tracked well and the knee was again irrigated copiously.    The medial arthrotomy was closed with #1 Ethibond. The subcutaneous tissue closed with 0 and 2-0 vicryl, and skin approximated with staples.  A dry sterile and compressive dressing was applied.  A Polar Care was applied to the operative knee along with a knee immobilizer.  The patient was awakened and brought to the PACU in stable and satisfactory condition.  All sharp, lap and instrument  counts were correct at the conclusion the case. I spoke with the patient's husband by phone from the PACU to let him know the case had been performed without complication and the patient was stable in recovery room.

## 2019-09-26 NOTE — Anesthesia Postprocedure Evaluation (Signed)
Anesthesia Post Note  Patient: Robin Espinoza  Procedure(s) Performed: RIGHT TOTAL KNEE ARTHROPLASTY (Right Knee)  Patient location during evaluation: PACU Anesthesia Type: Spinal Level of consciousness: awake and alert Pain management: pain level controlled Vital Signs Assessment: post-procedure vital signs reviewed and stable Respiratory status: spontaneous breathing and respiratory function stable Cardiovascular status: stable Anesthetic complications: no   No complications documented.   Last Vitals:  Vitals:   09/26/19 1249 09/26/19 1304  BP: 121/67 125/70  Pulse: (!) 58 (!) 58  Resp: 14 13  Temp:    SpO2: 97% 97%    Last Pain:  Vitals:   09/26/19 1249  TempSrc:   PainSc: 0-No pain                 Darrick Greenlaw K

## 2019-09-26 NOTE — Transfer of Care (Signed)
Immediate Anesthesia Transfer of Care Note  Patient: Robin Espinoza  Procedure(s) Performed: RIGHT TOTAL KNEE ARTHROPLASTY (Right Knee)  Patient Location: PACU  Anesthesia Type:General  Level of Consciousness: awake  Airway & Oxygen Therapy: Patient connected to nasal cannula oxygen  Post-op Assessment: Report given to RN  Post vital signs: stable  Last Vitals:  Vitals Value Taken Time  BP 130/66 09/26/19 1104  Temp 36.2 C 09/26/19 1104  Pulse 65 09/26/19 1108  Resp 18 09/26/19 1108  SpO2 97 % 09/26/19 1108  Vitals shown include unvalidated device data.  Last Pain:  Vitals:   09/26/19 1104  TempSrc:   PainSc: (P) 0-No pain         Complications: No complications documented.

## 2019-09-26 NOTE — Care Management Obs Status (Signed)
[place] NOTIFICATION   Patient Details  Name: DAANA PETRASEK MRN: [id number] Date of Birth: 14-Mar-1946   Medicare Observation Status Notification Given:  Yes    Anselm Pancoast, RN 09/26/2019, 3:59 PM

## 2019-09-26 NOTE — Care Management CC44 (Signed)
Condition Code 44 Documentation Completed  Patient Details  Name: ZAYNEB BAUCUM MRN: [id number] Date of Birth: 1946/02/09   Condition Code 44 given:  Yes Patient signature on Condition Code 44 notice:  Yes Documentation of 2 MD's agreement:  Yes Code 44 added to claim:  Yes    Anselm Pancoast, RN 09/26/2019, 3:59 PM

## 2019-09-26 NOTE — Evaluation (Signed)
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  PMH includes anxiety, depression, hypothyroidism and HTN.    Clinical Impression  Pt pleasant and motivated to participate during the session.  Pt reported no pain at rest and minimal pain to the R knee with activity and weight bearing.  Pt put forth good effort during the session but ultimately required physical assistance with bed mobility tasks and transfers and was only able to take several very [place], effortful steps at the EOB with a RW for support.  Pt has a h/o multiple falls over the last 6 months each secondary to her R knee buckling and presented with mod lean on the RW when attempting to advance her non-surgical leg eventually resorting to shuffling her left foot to avoid full WB on the RLE.  Pt will benefit from PT services in a SNF setting upon discharge to safely address deficits listed in patient problem list for decreased caregiver assistance and eventual return to PLOF.      Follow Up Recommendations SNF    Equipment Recommendations  Rolling walker with 5" wheels    Recommendations for Other Services       Precautions / Restrictions Precautions Precautions: Fall Precaution Comments: KI to RLE except with CPM or with PT Required Braces or Orthoses: Knee Immobilizer - Right Restrictions Weight Bearing Restrictions: Yes RLE Weight Bearing: Weight bearing as tolerated      Mobility  Bed Mobility Overal bed mobility: Needs Assistance Bed Mobility: Supine to Sit;Sit to Supine     Supine to sit: Mod assist;HOB elevated Sit to supine: Mod assist;HOB elevated   General bed mobility comments: Mod A for RLE and trunk control  Transfers Overall transfer level: Needs assistance Equipment used: Rolling walker (2 wheeled) Transfers: Sit to/from  Stand Sit to Stand: Min assist;From elevated surface         General transfer comment: Mod verbal cues for foot and hand placement with Min A to come to full upright position  Ambulation/Gait Ambulation/Gait assistance: Min guard Gait Distance (Feet): 1 Feet Assistive device: Rolling walker (2 wheeled) Gait Pattern/deviations: Step-to pattern;Shuffle;Antalgic;Decreased step length - left;Decreased stance time - right Gait velocity: decreased   General Gait Details: Pt able to take several very [place], effortful steps at the EOB with occasional shuffle of the LLE with mod verbal cues for sequencing with the RW  Stairs            Wheelchair Mobility    Modified Rankin (Stroke Patients Only)       Balance Overall balance assessment: Needs assistance   Sitting balance-Leahy Scale: Good     Standing balance support: Bilateral upper extremity supported Standing balance-Leahy Scale: Fair Standing balance comment: Mod lean on the RW for support                             Pertinent Vitals/Pain Pain Assessment: No/denies pain    Home Living Family/patient expects to be discharged to:: Private residence Living Arrangements: Spouse/significant other Available Help at Discharge: Family;Available PRN/intermittently Type of Home: House Home Access: Level entry     Home Layout: One level Home Equipment: Walker - 4 wheels;Bedside commode;Grab bars - tub/shower Additional Comments: Owns med alert system    Prior Function Level of  Independence: Independent with assistive device(s)         Comments: Mod Ind amb with a rollator HH distances, 4 falls in the last 6 months secondary to R knee buckling     Hand Dominance        Extremity/Trunk Assessment   Upper Extremity Assessment Upper Extremity Assessment: Generalized weakness    Lower Extremity Assessment Lower Extremity Assessment: Generalized weakness;RLE deficits/detail RLE Deficits / Details:  BLE ankle AROM/strength WFL; R hip flex strength < 3/5 RLE: Unable to fully assess due to pain RLE Sensation: WNL RLE Coordination: WNL       Communication   Communication: No difficulties  Cognition Arousal/Alertness: Awake/alert Behavior During Therapy: WFL for tasks assessed/performed Overall Cognitive Status: Within Functional Limits for tasks assessed                                        General Comments      Exercises Total Joint Exercises Ankle Circles/Pumps: AROM;Strengthening;Both;10 reps Quad Sets: AROM;Strengthening;Right;10 reps Hip ABduction/ADduction: AROM;AAROM;Both;10 reps (AAROM on the RLE) Straight Leg Raises: AROM;AAROM;Both;10 reps (AAROM on the RLE) Long Arc Quad: AROM;Right;10 reps;5 reps;Strengthening Knee Flexion: AROM;Right;10 reps;5 reps;Strengthening Goniometric ROM: R knee AROM: 10-81 deg Marching in Standing: AROM;Both;5 reps;Standing Other Exercises Other Exercises: HEP for R knee LAQs and QS x 10 each 5-6x/day   Assessment/Plan    PT Assessment Patient needs continued PT services  PT Problem List Decreased strength;Decreased range of motion;Decreased activity tolerance;Decreased balance;Decreased mobility;Decreased knowledge of use of DME;Pain       PT Treatment Interventions DME instruction;Gait training;Functional mobility training;Therapeutic activities;Therapeutic exercise;Balance training;Patient/family education    PT Goals (Current goals can be found in the Care Plan section)  Acute Rehab PT Goals Patient Stated Goal: To walk better and without a walker PT Goal Formulation: With patient Time For Goal Achievement: 10/09/19 Potential to Achieve Goals: Fair    Frequency BID   Barriers to discharge Inaccessible home environment      Co-evaluation               AM-PAC PT "6 Clicks" Mobility  Outcome Measure Help needed turning from your back to your side while in a flat bed without using bedrails?: A  Lot Help needed moving from lying on your back to sitting on the side of a flat bed without using bedrails?: A Lot Help needed moving to and from a bed to a chair (including a wheelchair)?: A Lot Help needed standing up from a chair using your arms (e.g., wheelchair or bedside chair)?: A Little Help needed to walk in hospital room?: Total Help needed climbing 3-5 steps with a railing? : Total 6 Click Score: 11    End of Session Equipment Utilized During Treatment: Gait belt Activity Tolerance: Patient tolerated treatment well Patient left: in bed;with call bell/phone within reach;with bed alarm set;with SCD's reapplied;Other (comment);with nursing/sitter in room;with family/visitor present (Polar care donned to R knee) Nurse Communication: Mobility status;Weight bearing status PT Visit Diagnosis: History of falling (Z91.81);Muscle weakness (generalized) (M62.81);Other abnormalities of gait and mobility (R26.89);Pain Pain - Right/Left: Right Pain - part of body: Knee    Time: 2094-7096 PT Time Calculation (min) (ACUTE ONLY): 43 min   Charges:   PT Evaluation $PT Eval Moderate Complexity: 1 Mod PT Treatments $Therapeutic Exercise: 8-22 mins       D. Royetta Asal PT, DPT 09/26/19, 5:51 PM

## 2019-09-26 NOTE — TOC Progression Note (Signed)
Transition of Care [facility]) - Progression Note    Patient Details  Name: Robin Espinoza MRN: [id number] Date of Birth: 11-12-1945  Transition of [facility]) CM/SW Contact  Su Hilt, RN Phone Number: 09/26/2019, 3:00 PM  Clinical Narrative:     Spoke with Dr Mack Guise, he tells me the patient plans to go to SNF for STR after DC, She lives at home with her husband and does not feel that he wil be able to care for her, Dr Mack Guise explained that she would have to qualify, I let him know I would do a bed search but I would also have to get insurance approval, FL2 unable to be completed until on the floor, PASSR and Bedsearch completed, will review bed offers once obtained       Expected Discharge Plan and Services                                                 Social Determinants of Health (SDOH) Interventions    Readmission Risk Interventions Espinoza flowsheet data found.

## 2019-09-26 NOTE — Anesthesia Procedure Notes (Signed)
Procedure Name: Intubation Date/Time: 09/26/2019 8:29 AM Performed by: Zetta Bills, CRNA Pre-anesthesia Checklist: Patient identified, Emergency Drugs available, Suction available and Patient being monitored Patient Re-evaluated:Patient Re-evaluated prior to induction Oxygen Delivery Method: Circle system utilized Preoxygenation: Pre-oxygenation with 100% oxygen Induction Type: IV induction Ventilation: Mask ventilation without difficulty Laryngoscope Size: Mac and 4 Grade View: Grade II Tube type: Oral Tube size: 7.0 mm Number of attempts: 1 Airway Equipment and Method: Patient positioned with wedge pillow and Stylet Placement Confirmation: ETT inserted through vocal cords under direct vision Secured at: 22 cm Tube secured with: Tape Dental Injury: Teeth and Oropharynx as per pre-operative assessment

## 2019-09-26 NOTE — Anesthesia Preprocedure Evaluation (Signed)
Anesthesia Evaluation  Patient identified by MRN, date of birth, ID band Patient awake    Reviewed: Allergy & Precautions, NPO status , Patient's Chart, lab work & pertinent test results  History of Anesthesia Complications Negative for: history of anesthetic complications  Airway Mallampati: III       Dental   Pulmonary sleep apnea , neg COPD, Not current smoker, former smoker,           Cardiovascular hypertension, Pt. on medications and Pt. on home beta blockers (-) Past MI and (-) CHF (-) dysrhythmias (-) Valvular Problems/Murmurs     Neuro/Psych neg Seizures Anxiety Depression    GI/Hepatic Neg liver ROS, GERD  Medicated,  Endo/Other  Hypothyroidism   Renal/GU negative Renal ROS     Musculoskeletal   Abdominal   Peds  Hematology   Anesthesia Other Findings   Reproductive/Obstetrics                             Anesthesia Physical Anesthesia Plan  ASA: III  Anesthesia Plan: Spinal   Post-op Pain Management:    Induction:   PONV Risk Score and Plan:   Airway Management Planned: Nasal Cannula  Additional Equipment:   Intra-op Plan:   Post-operative Plan:   Informed Consent: I have reviewed the patients History and Physical, chart, labs and discussed the procedure including the risks, benefits and alternatives for the proposed anesthesia with the patient or authorized representative who has indicated his/her understanding and acceptance.       Plan Discussed with:   Anesthesia Plan Comments:         Anesthesia Quick Evaluation

## 2019-09-27 ENCOUNTER — Encounter: Payer: Self-pay | Admitting: Orthopedic Surgery

## 2019-09-27 LAB — BASIC METABOLIC PANEL
Anion gap: 8 (ref 5–15)
BUN: 12 mg/dL (ref 8–23)
CO2: 29 mmol/L (ref 22–32)
Calcium: 8.4 mg/dL — ABNORMAL LOW (ref 8.9–10.3)
Chloride: 104 mmol/L (ref 98–111)
Creatinine, Ser: 0.85 mg/dL (ref 0.44–1.00)
GFR calc Af Amer: >60 mL/min (ref >60)
GFR calc non Af Amer: >60 mL/min (ref >60)
Glucose, Bld: 95 mg/dL (ref 70–99)
Potassium: 3.8 mmol/L (ref 3.5–5.1)
Sodium: 141 mmol/L (ref 135–145)

## 2019-09-27 LAB — CBC
HCT: 39.5 % (ref 36.0–46.0)
Hemoglobin: 13.1 g/dL (ref 12.0–15.0)
MCH: 32.8 pg (ref 26.0–34.0)
MCHC: 33.2 g/dL (ref 30.0–36.0)
MCV: 98.8 fL (ref 80.0–100.0)
Platelets: 152 10*3/uL (ref 150–400)
RBC: 4 MIL/uL (ref 3.87–5.11)
RDW: 13.4 % (ref 11.5–15.5)
WBC: 11.8 10*3/uL — ABNORMAL HIGH (ref 4.0–10.5)
nRBC: 0 % (ref 0.0–0.2)

## 2019-09-27 NOTE — Progress Notes (Signed)
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  PMH includes anxiety, depression, hypothyroidism and HTN.    PT Comments    Pt pleasant and motivated to participate during the session. Pt appeared to improve with bed mobility during today's session, requiring a min assist and min cuing to get from supine to sitting. Pt continued to lack confidence in abilities with higher level movements such as sit-to-stand transfers, standing marching, and ambulation. With cueing and encouragement, pt was able to walk a few steps to the chair with a RW and a step-to gait pattern. Pt initially lacked confidence bearing weight through RLE but improved after a few [place] steps. Pt will benefit from PT services in a SNF setting upon discharge to safely address deficits listed in patient problem list for decreased caregiver assistance and eventual return to PLOF.   Follow Up Recommendations  SNF     Equipment Recommendations  Rolling walker with 5" wheels    Recommendations for Other Services       Precautions / Restrictions Precautions Precautions: Fall Precaution Comments: KI to RLE except with CPM or with PT Required Braces or Orthoses: Knee Immobilizer - Right Restrictions Weight Bearing Restrictions: Yes RLE Weight Bearing: Weight bearing as tolerated    Mobility  Bed Mobility Overal bed mobility: Needs Assistance Bed Mobility: Supine to Sit     Supine to sit: HOB elevated;Min assist     General bed mobility comments: Min A for RLE and trunk control  Transfers Overall transfer level: Needs assistance Equipment used: Rolling walker (2 wheeled) Transfers: Sit to/from Stand Sit to Stand: Min assist;From elevated surface         General transfer comment: Mod verbal cues for foot and hand placement with Min A to  come to full upright position  Ambulation/Gait Ambulation/Gait assistance: Min guard Gait Distance (Feet): 6 Feet Assistive device: Rolling walker (2 wheeled) Gait Pattern/deviations: Step-to pattern;Shuffle;Antalgic;Decreased step length - left;Decreased stance time - right Gait velocity: decreased   General Gait Details: Pt able to take several very [place], effortful steps at the EOB with occasional shuffle of the LLE with mod verbal cues for sequencing with the RW   Stairs             Wheelchair Mobility    Modified Rankin (Stroke Patients Only)       Balance Overall balance assessment: Needs assistance Sitting-balance support: Feet unsupported;No upper extremity supported Sitting balance-Leahy Scale: Good     Standing balance support: Bilateral upper extremity supported;During functional activity Standing balance-Leahy Scale: Good Standing balance comment: Mod lean on the RW for support                            Cognition Arousal/Alertness: Awake/alert Behavior During Therapy: WFL for tasks assessed/performed Overall Cognitive Status: Within Functional Limits for tasks assessed                                        Exercises Total Joint Exercises Ankle Circles/Pumps: AROM;Strengthening;Both;10 reps Quad Sets: AROM;Strengthening;Right;10 reps Straight Leg Raises: AROM;AAROM;Both;10 reps Long Arc Quad: AROM;Right;Strengthening;15 reps Knee Flexion: AROM;Right;10 reps;5 reps;Strengthening Goniometric ROM: 10-83 deg Marching in Standing: AROM;Both;5 reps;Standing  Other Exercises Other Exercises: HEP for R knee LAQs and QS x 10 each 5-6x/day    General Comments        Pertinent Vitals/Pain Pain Assessment: No/denies pain    Home Living                      Prior Function            PT Goals (current goals can now be found in the care plan section) Progress towards PT goals: Progressing toward goals     Frequency    BID      PT Plan Current plan remains appropriate    Co-evaluation              AM-PAC PT "6 Clicks" Mobility   Outcome Measure  Help needed turning from your back to your side while in a flat bed without using bedrails?: A Lot Help needed moving from lying on your back to sitting on the side of a flat bed without using bedrails?: A Little Help needed moving to and from a bed to a chair (including a wheelchair)?: A Little Help needed standing up from a chair using your arms (e.g., wheelchair or bedside chair)?: A Little Help needed to walk in hospital room?: A Lot Help needed climbing 3-5 steps with a railing? : Total 6 Click Score: 14    End of Session Equipment Utilized During Treatment: Gait belt Activity Tolerance: Patient tolerated treatment well Patient left: with call bell/phone within reach;with SCD's reapplied;Other (comment);in chair;with chair alarm set (polar care and knee immobilizer reapplied to R knee) Nurse Communication: Mobility status;Weight bearing status PT Visit Diagnosis: History of falling (Z91.81);Muscle weakness (generalized) (M62.81);Other abnormalities of gait and mobility (R26.89);Pain Pain - Right/Left: Right Pain - part of body: Knee     Time: 9741-6384 PT Time Calculation (min) (ACUTE ONLY): 50 min  Charges:  $Gait Training: 8-22 mins $Therapeutic Exercise: 23-37 mins                     D. Scott Ottie Tillery PT, DPT 09/27/19, 1:34 PM

## 2019-09-27 NOTE — NC FL2 (Signed)
[place] LEVEL OF CARE SCREENING TOOL     IDENTIFICATION  Patient Name: Robin Espinoza Birthdate: 1945-12-28 Sex: female Admission Date (Current Location): 09/26/2019  [place] and [place] Number:  Engineering geologist and Address:  [facility], [address] [place], [place] [address]      Provider Number: [id number]  Attending Physician Name and Address:  Thornton Park, MD  Relative Name and Phone Number:  Adelfa Koh [phone]    Current Level of Care: Hospital Recommended Level of Care: [place] Prior Approval Number:    Date Approved/Denied:   PASRR Number: [id number]  Discharge Plan: SNF    Current Diagnoses: Patient Active Problem List   Diagnosis Date Noted  . S/P TKR (total knee replacement) using cement, 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  . BMI 40.0-44.9, adult ([place]) 04/15/2018  . Avitaminosis D 02/28/2015  . Hypothyroidism 11/13/2014  . Anxiety disorder 10/15/2014  . Insomnia 09/21/2014  . Acid reflux 11/30/2007  . Arthritis, degenerative 09/16/2007  . Fam hx-ischem heart disease 02/12/2007  . Adiposity 02/09/2007  . Obstructive apnea 05/06/2006  . 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 BP (high blood pressure) 03/23/1998    Orientation RESPIRATION BLADDER Height & Weight     Self, Time, Situation, Place  Normal External catheter Weight: 111.7 kg Height:  5\' 5"  (165.1 cm)  BEHAVIORAL SYMPTOMS/MOOD NEUROLOGICAL BOWEL NUTRITION STATUS      Continent Diet (Regular)  AMBULATORY STATUS COMMUNICATION OF NEEDS Skin   Extensive Assist Verbally Surgical wounds                       Personal Care Assistance Level of Assistance  Dressing, Bathing Bathing Assistance: Limited assistance   Dressing Assistance: Limited assistance     Functional Limitations Info             SPECIAL CARE FACTORS FREQUENCY  PT (By licensed PT)     PT Frequency: 5 times per week               Contractures Contractures Info: Not present    Additional Factors Info  Code Status, Allergies Code Status Info: full code Allergies Info: Amlodipine           Current Medications (09/27/2019):  This is the current hospital active medication list Current Facility-Administered Medications  Medication Dose Route Frequency Provider Last Rate Last Admin  . 0.9 %  sodium chloride infusion   Intravenous Continuous Thornton Park, MD 75 mL/hr at 09/27/19 0536 New Bag at 09/27/19 0536  . acetaminophen (TYLENOL) tablet 1,000 mg  1,000 mg Oral Q6H Thornton Park, MD   1,000 mg at 09/27/19 0533  . acetaminophen (TYLENOL) tablet 325-650 mg  325-650 mg Oral Q6H PRN Thornton Park, MD      . alum & mag hydroxide-simeth (MAALOX/MYLANTA) 200-200-20 MG/5ML suspension 30 mL  30 mL Oral Q4H PRN Thornton Park, MD      . bisacodyl (DULCOLAX) suppository 10 mg  10 mg Rectal Daily PRN Thornton Park, MD      . diphenhydrAMINE (BENADRYL) 12.5 MG/5ML elixir 12.5-25 mg  12.5-25 mg Oral Q4H PRN Thornton Park, MD      . docusate sodium (COLACE) capsule 100 mg  100 mg Oral BID Thornton Park, MD   100 mg at 09/27/19 1001  . enoxaparin (LOVENOX) injection 40 mg  40 mg Subcutaneous Q12H Thornton Park, MD   40 mg at 09/27/19 1009  . gabapentin (NEURONTIN) capsule 300  mg  300 mg Oral TID Thornton Park, MD   300 mg at 09/27/19 1001  . HYDROmorphone (DILAUDID) injection 0.5-1 mg  0.5-1 mg Intravenous Q4H PRN Thornton Park, MD      . levothyroxine (SYNTHROID) tablet 112 mcg  112 mcg Oral QAC breakfast Thornton Park, MD   112 mcg at 09/27/19 0533  . menthol-cetylpyridinium (CEPACOL) lozenge 3 mg  1 lozenge Oral PRN Thornton Park, MD       Or  . phenol (CHLORASEPTIC) mouth spray 1 spray  1 spray Mouth/Throat PRN Thornton Park, MD      . methocarbamol (ROBAXIN) tablet 500 mg  500 mg Oral Q6H PRN Thornton Park, MD       Or  . methocarbamol (ROBAXIN) 500 mg in dextrose 5 % 50 mL  IVPB  500 mg Intravenous Q6H PRN Thornton Park, MD      . metoprolol succinate (TOPROL-XL) 24 hr tablet 100 mg  100 mg Oral Daily Thornton Park, MD   100 mg at 09/27/19 1009  . multivitamin with minerals tablet 1 tablet  1 tablet Oral Daily Thornton Park, MD   1 tablet at 09/27/19 1001  . ondansetron (ZOFRAN) tablet 4 mg  4 mg Oral Q6H PRN Thornton Park, MD       Or  . ondansetron [facility]) injection 4 mg  4 mg Intravenous Q6H PRN Thornton Park, MD      . oxybutynin (DITROPAN-XL) 24 hr tablet 10 mg  10 mg Oral QHS Thornton Park, MD   10 mg at 09/26/19 2110  . oxyCODONE (Oxy IR/ROXICODONE) immediate release tablet 10-15 mg  10-15 mg Oral Q4H PRN Thornton Park, MD      . oxyCODONE (Oxy IR/ROXICODONE) immediate release tablet 5-10 mg  5-10 mg Oral Q4H PRN Thornton Park, MD   5 mg at 09/27/19 1000  . pantoprazole (PROTONIX) EC tablet 40 mg  40 mg Oral Daily Thornton Park, MD   40 mg at 09/27/19 1002  . polycarbophil (FIBERCON) tablet 1,250 mg  1,250 mg Oral Daily Thornton Park, MD   1,250 mg at 09/27/19 1002  . polyvinyl alcohol (LIQUIFILM TEARS) 1.4 % ophthalmic solution 1 drop  1 drop Both Eyes PRN Oswald Hillock, RPH      . pravastatin (PRAVACHOL) tablet 40 mg  40 mg Oral q1800 Thornton Park, MD   40 mg at 09/26/19 2110  . senna-docusate (Senokot-S) tablet 1 tablet  1 tablet Oral QHS PRN Thornton Park, MD      . sodium phosphate (FLEET) 7-19 GM/118ML enema 1 enema  1 enema Rectal Once PRN Thornton Park, MD      . traMADol Veatrice Bourbon) tablet 50 mg  50 mg Oral Q6H Thornton Park, MD   50 mg at 09/27/19 0532  . traZODone (DESYREL) tablet 100 mg  100 mg Oral QHS Thornton Park, MD   100 mg at 09/26/19 2110  . venlafaxine XR (EFFEXOR-XR) 24 hr capsule 150 mg  150 mg Oral Q breakfast Thornton Park, MD   150 mg at 09/27/19 1002     Discharge Medications: Please see discharge summary for a list of discharge medications.  Relevant Imaging Results:  Relevant  Lab Results:   Additional Information SS# [id number]  Su Hilt, RN

## 2019-09-27 NOTE — Progress Notes (Signed)
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  PMH includes anxiety, depression, hypothyroidism and HTN.    PT Comments    Pt pleasant and motivated to participate during the session. Pt continued to require min assist with RLE and trunk control for bed mobility, although has improved with ability to slide/adduct RLE on the bed. Pt had increased pain from this morning, which appeared to further decrease her confidence with movements such as supine-sit and gait. Pt had more trouble lifting her LLE in standing and side-stepping this afternoon, likely secondary to pain. Pt will benefit from PT services in a SNF setting upon discharge to safely address deficits listed in patient problem list for decreased caregiver assistance and eventual return to PLOF.   Follow Up Recommendations  SNF     Equipment Recommendations  Rolling walker with 5" wheels    Recommendations for Other Services       Precautions / Restrictions Precautions Precautions: Fall Precaution Comments: KI to RLE except with CPM or with PT Required Braces or Orthoses: Knee Immobilizer - Right Restrictions Weight Bearing Restrictions: Yes RLE Weight Bearing: Weight bearing as tolerated    Mobility  Bed Mobility Overal bed mobility: Needs Assistance Bed Mobility: Supine to Sit;Sit to Supine     Supine to sit: HOB elevated;Min assist Sit to supine: HOB elevated;Mod assist   General bed mobility comments: Min A for RLE and trunk control  Transfers Overall transfer level: Needs assistance Equipment used: Rolling walker (2 wheeled) Transfers: Sit to/from Stand Sit to Stand: Min assist;From elevated surface         General transfer comment: Mod verbal cues for foot and hand placement with Min A to come to full upright  position  Ambulation/Gait Ambulation/Gait assistance: Min guard Gait Distance (Feet): 2 Feet Assistive device: Rolling walker (2 wheeled) Gait Pattern/deviations: Step-to pattern;Shuffle;Antalgic;Decreased step length - left;Decreased stance time - right Gait velocity: decreased   General Gait Details: Pt able to take several very [place], effortful side-steps at the EOB with mod verbal cues for sequencing with the RW. Pt was able to step with the RLE, but shuffled with the LLE due to difficulty bearing weight through the RLE   Stairs             Wheelchair Mobility    Modified Rankin (Stroke Patients Only)       Balance Overall balance assessment: Needs assistance Sitting-balance support: Feet unsupported;No upper extremity supported Sitting balance-Leahy Scale: Good     Standing balance support: Bilateral upper extremity supported;During functional activity Standing balance-Leahy Scale: Good Standing balance comment: Mod lean on the RW for support                            Cognition Arousal/Alertness: Awake/alert Behavior During Therapy: WFL for tasks assessed/performed Overall Cognitive Status: Within Functional Limits for tasks assessed                                        Exercises Total Joint Exercises Ankle Circles/Pumps: AROM;Strengthening;Both;10 reps Quad Sets: AROM;Strengthening;Right;10 reps Gluteal Sets: Strengthening;10 reps Straight Leg Raises: AROM;AAROM;Both;10 reps Long Arc Quad: AROM;Right;Strengthening;15 reps Knee Flexion: AROM;Right;10 reps;5 reps;Strengthening Goniometric  ROM: 10-83 deg Marching in Standing: AROM;Both;5 reps;Standing Other Exercises Other Exercises: HEP for R knee LAQs and QS x 10 each 5-6x/day    General Comments        Pertinent Vitals/Pain Pain Assessment: 0-10 Pain Score: 3  Pain Descriptors / Indicators: Grimacing;Spasm;Guarding Pain Intervention(s): Monitored during  session;Repositioned    Home Living Family/patient expects to be discharged to:: Private residence Living Arrangements: Spouse/significant other Available Help at Discharge: Family;Available PRN/intermittently Type of Home: House Home Access: Level entry   Home Layout: One level        Prior Function            PT Goals (current goals can now be found in the care plan section) Progress towards PT goals: Progressing toward goals    Frequency    BID      PT Plan Current plan remains appropriate    Co-evaluation              AM-PAC PT "6 Clicks" Mobility   Outcome Measure  Help needed turning from your back to your side while in a flat bed without using bedrails?: A Lot Help needed moving from lying on your back to sitting on the side of a flat bed without using bedrails?: A Little Help needed moving to and from a bed to a chair (including a wheelchair)?: A Little Help needed standing up from a chair using your arms (e.g., wheelchair or bedside chair)?: A Little Help needed to walk in hospital room?: A Lot Help needed climbing 3-5 steps with a railing? : Total 6 Click Score: 14    End of Session Equipment Utilized During Treatment: Gait belt Activity Tolerance: Patient tolerated treatment well;Patient limited by pain Patient left: in bed;with call bell/phone within reach;with bed alarm set;with family/visitor present;with SCD's reapplied Nurse Communication: Mobility status;Weight bearing status PT Visit Diagnosis: History of falling (Z91.81);Muscle weakness (generalized) (M62.81);Other abnormalities of gait and mobility (R26.89);Pain Pain - Right/Left: Right Pain - part of body: Knee     Time: 0156-1537 PT Time Calculation (min) (ACUTE ONLY): 48 min  Charges:  $Gait Training: 8-22 mins $Therapeutic Exercise: 23-37 mins                     D. Scott Dehaven Sine PT, DPT 09/27/19, 3:27 PM

## 2019-09-27 NOTE — Progress Notes (Signed)
  Subjective:  POD #1 s/p right total knee arthroplasty.   Patient reports right knee pain as mild to moderate.  Pain increases with movement of the right lower extremity.  Patient has been able to participate with physical therapy.  She denies any nausea or vomiting.  She denies any shortness of breath, chest pain or abdominal pain.  She is tolerating a p.o.diet.  Objective:   VITALS:   Vitals:   09/27/19 0416 09/27/19 0802 09/27/19 1009 09/27/19 1142  BP: 132/73 130/64  (!) 148/75  Pulse: (!) 55 (!) 59 62 (!) 55  Resp: 18 17  15   Temp: 97.7 F (36.5 C) (!) 97.4 F (36.3 C)  97.6 F (36.4 C)  TempSrc:      SpO2: 97% 98%  99%  Weight:      Height:        PHYSICAL EXAM: Right lower extremity Neurovascular intact Sensation intact distally Intact pulses distally Dorsiflexion/Plantar flexion intact Incision: dressing C/D/I No cellulitis present Compartment soft  LABS  No results found for this or any previous visit (from the past 24 hour(s)).  DG Knee Right Port  Result Date: 09/26/2019 CLINICAL DATA:  Status post right knee replacement. EXAM: PORTABLE RIGHT KNEE - 1-2 VIEW COMPARISON:  May 02, 2012. FINDINGS: The right femoral and tibial components appear to be well situated. Expected postoperative changes are noted in the soft tissues anteriorly. IMPRESSION: Status post right total knee arthroplasty. Electronically Signed   By: Marijo Conception M.D.   On: 09/26/2019 13:01    Assessment/Plan: 1 Day Post-Op   Active Problems:   S/P TKR (total knee replacement) using cement, right  Patient is doing well postop.  Continue with physical therapy.  Patient states that she has been accepted to [facility] upon discharge from the hospital.  Patient is beginning Lovenox 40 mg daily for DVT prophylaxis today.  She is weightbearing as tolerated on the right lower extremity.  Her dressing will be changed tomorrow.  Labs have not been drawn yet today and were ordered.    Thornton Park , MD 09/27/2019, 2:14 PM

## 2019-09-28 NOTE — Progress Notes (Signed)
Physical Therapy Treatment Patient Details Name: Robin Espinoza MRN: [id number] DOB: 27-Dec-1945 Today's Date: 09/28/2019    History of Present Illness Pt is a 74 yo female diagnosed with OA of the R knee and is s/p R TKA.  PMH includes anxiety, depression, hypothyroidism and HTN.    PT Comments    Returned for transfer training.  Pt asking to use bathroom.  To EOB with min a x 1 and mod encouragement to self assist.  Once sitting, generally steady.  Attempted to transfer with +1 assist but unable to do so safely so RN in to assist.  Stood and transferred with mod a x 2 to [facility].  Pt voicing fear and needing mod/max encouragement and cues not to sit before fully turning whish she does but is guided safely to commode.  After voiding, she stands with +3 assist for pt and staff safety due to tight spaces.  Min a from all with mod tactile cues for sequencing.  Returned to supine with mod a x 2.  Discussed discharge.  Pt challenging transfer at this time and choosing car transport puts her and staff at risk for injury.  She is to talk to husband to see if they will pay privately for EMS transfer.   Follow Up Recommendations  SNF     Equipment Recommendations  Rolling walker with 5" wheels    Recommendations for Other Services       Precautions / Restrictions Precautions Precautions: Fall Precaution Comments: KI to RLE except with CPM or with PT Required Braces or Orthoses: Knee Immobilizer - Right Restrictions Weight Bearing Restrictions: Yes RLE Weight Bearing: Weight bearing as tolerated    Mobility  Bed Mobility Overal bed mobility: Needs Assistance Bed Mobility: Supine to Sit;Sit to Supine     Supine to sit: Min assist Sit to supine: Mod assist;+2 for physical assistance   General bed mobility comments: Full assist to get LE's back into bed  Transfers Overall transfer level: Needs assistance Equipment used: Rolling walker (2 wheeled) Transfers: Sit to/from Stand Sit to  Stand: Mod assist;+2 physical assistance         General transfer comment: Mod a x 2 to commode, Pt trying to sit before fully turning. To return to bed, +3 fot pt/staff safety due to awakward spaces and pt fear.  Ambulation/Gait Ambulation/Gait assistance: Min assist;+2 physical assistance;+2 safety/equipment Gait Distance (Feet): 2 Feet Assistive device: Rolling walker (2 wheeled) Gait Pattern/deviations: Step-to pattern;Shuffle;Antalgic;Decreased step length - left;Decreased stance time - right Gait velocity: decreased   General Gait Details: pt voices fear which is primary limiting factor with safty during transfer   Stairs             Wheelchair Mobility    Modified Rankin (Stroke Patients Only)       Balance Overall balance assessment: Needs assistance Sitting-balance support: Feet unsupported;No upper extremity supported Sitting balance-Leahy Scale: Good     Standing balance support: Bilateral upper extremity supported;During functional activity Standing balance-Leahy Scale: Poor Standing balance comment: Mod lean on the RW for support                            Cognition Arousal/Alertness: Awake/alert Behavior During Therapy: WFL for tasks assessed/performed Overall Cognitive Status: Within Functional Limits for tasks assessed  Exercises Other Exercises Other Exercises: ankle pumps, quad sets, heel slides, ab/add and SAQ 2 x 10 AAROM    General Comments        Pertinent Vitals/Pain Pain Assessment: Faces Pain Score: 3  Faces Pain Scale: Hurts little more Pain Descriptors / Indicators: Grimacing;Spasm;Guarding Pain Intervention(s): Limited activity within patient's tolerance;Monitored during session    Home Living                      Prior Function            PT Goals (current goals can now be found in the care plan section) Progress towards PT goals: Progressing  toward goals    Frequency    BID      PT Plan Current plan remains appropriate    Co-evaluation              AM-PAC PT "6 Clicks" Mobility   Outcome Measure  Help needed turning from your back to your side while in a flat bed without using bedrails?: A Lot Help needed moving from lying on your back to sitting on the side of a flat bed without using bedrails?: A Lot Help needed moving to and from a bed to a chair (including a wheelchair)?: A Lot Help needed standing up from a chair using your arms (e.g., wheelchair or bedside chair)?: A Lot Help needed to walk in hospital room?: A Lot Help needed climbing 3-5 steps with a railing? : Total 6 Click Score: 11    End of Session Equipment Utilized During Treatment: Gait belt Activity Tolerance: Patient tolerated treatment well;Patient limited by pain Patient left: in bed;with call bell/phone within reach;with bed alarm set;with family/visitor present;with SCD's reapplied Nurse Communication: Mobility status;Weight bearing status PT Visit Diagnosis: History of falling (Z91.81);Muscle weakness (generalized) (M62.81);Other abnormalities of gait and mobility (R26.89);Pain Pain - Right/Left: Right Pain - part of body: Knee     Time: 1142-1206 PT Time Calculation (min) (ACUTE ONLY): 24 min  Charges:  $Therapeutic Exercise: 8-22 mins $Therapeutic Activity: 23-37 mins                    Chesley Noon, PTA 09/28/19, 12:21 PM

## 2019-09-28 NOTE — TOC Progression Note (Signed)
Transition of Care [facility]) - Progression Note    Patient Details  Name: Robin Espinoza MRN: [id number] Date of Birth: 1945/07/15  Transition of Care [facility]) CM/SW [place], RN Phone Number: 09/28/2019, 3:53 PM  Clinical Narrative:     The patient is aware that the EMS will not be covered by insurance and that they will incur a bill  From EMS to Transport to [facility], She will DC tomorrow to [facility] but will Use EMS for transport      Expected Discharge Plan and Services                                                 Social Determinants of Health (SDOH) Interventions    Readmission Risk Interventions No flowsheet data found.

## 2019-09-28 NOTE — Progress Notes (Signed)
  Subjective:  POD #2 s/p right total knee arthroplasty.   Patient reports right knee pain as mild at rest and marked with movement.  Patient admits she is apprehensive with physical therapy.  Objective:   VITALS:   Vitals:   09/28/19 0013 09/28/19 0759 09/28/19 1004 09/28/19 1537  BP: (!) 151/74 (!) 159/89  (!) 152/74  Pulse: 66 65 67 69  Resp: 16 17  17   Temp: 99 F (37.2 C) 98.3 F (36.8 C)  98.5 F (36.9 C)  TempSrc: Oral Oral  Oral  SpO2: 94% (!) 80% 96% 100%  Weight:      Height:        PHYSICAL EXAM: Right lower extremity: I personally change the patient's dressing today.  There is no erythema ecchymosis significant swelling or drainage.  A new Aquacel dressing was applied over her incision.  An Ace wrap was applied over the right knee to continue compression and prevent swelling. Neurovascular intact Sensation intact distally Intact pulses distally Dorsiflexion/Plantar flexion intact Incision: no drainage No cellulitis present Compartment soft  LABS  No results found for this or any previous visit (from the past 24 hour(s)).  No results found.  Assessment/Plan: 2 Days Post-Op   Active Problems:   S/P TKR (total knee replacement) using cement, right  Patient is stable postop.  She will continue with physical therapy.  Continue current pain management as she does get relief on her current medications.  Patient will continue Lovenox for DVT prophylaxis.  I anticipate discharge to [facility] tomorrow.    Thornton Park , MD 09/28/2019, 8:47 PM

## 2019-09-28 NOTE — Progress Notes (Signed)
Physical Therapy Treatment Patient Details Name: Robin Espinoza MRN: [id number] DOB: 11/20/1945 Today's Date: 09/28/2019    History of Present Illness Pt is a 74 yo female diagnosed with OA of the R knee and is s/p R TKA.  PMH includes anxiety, depression, hypothyroidism and HTN.    PT Comments    Pt just returned to supine from transfer to commode with tech.  Tech reported +1 assist but she felt hesitant to "trust" her RLE today.  Short shuffling steps.  She initially declined but agrees to supine ex with encouragement.  She agreed to OOB training later this am.  Per Care Manager, pt has been approved for SNF stay but will need to transport via private car.  Will focus on transfers later this am for transition.   Follow Up Recommendations  SNF     Equipment Recommendations  Rolling walker with 5" wheels    Recommendations for Other Services       Precautions / Restrictions Precautions Precautions: Fall Precaution Comments: KI to RLE except with CPM or with PT Required Braces or Orthoses: Knee Immobilizer - Right Restrictions Weight Bearing Restrictions: Yes RLE Weight Bearing: Weight bearing as tolerated    Mobility  Bed Mobility                  Transfers                    Ambulation/Gait                 Stairs             Wheelchair Mobility    Modified Rankin (Stroke Patients Only)       Balance                                            Cognition Arousal/Alertness: Awake/alert Behavior During Therapy: WFL for tasks assessed/performed Overall Cognitive Status: Within Functional Limits for tasks assessed                                        Exercises Other Exercises Other Exercises: ankle pumps, quad sets, heel slides, ab/add and SAQ 2 x 10 AAROM    General Comments        Pertinent Vitals/Pain Pain Assessment: 0-10 Pain Score: 3  Pain Descriptors / Indicators:  Grimacing;Spasm;Guarding Pain Intervention(s): Limited activity within patient's tolerance;Monitored during session    Home Living                      Prior Function            PT Goals (current goals can now be found in the care plan section) Progress towards PT goals: Progressing toward goals    Frequency    BID      PT Plan Current plan remains appropriate    Co-evaluation              AM-PAC PT "6 Clicks" Mobility   Outcome Measure  Help needed turning from your back to your side while in a flat bed without using bedrails?: A Lot Help needed moving from lying on your back to sitting on the side of a flat bed without using bedrails?: A Little Help needed moving to  and from a bed to a chair (including a wheelchair)?: A Little Help needed standing up from a chair using your arms (e.g., wheelchair or bedside chair)?: A Little Help needed to walk in hospital room?: A Lot Help needed climbing 3-5 steps with a railing? : Total 6 Click Score: 14    End of Session Equipment Utilized During Treatment: Gait belt Activity Tolerance: Patient tolerated treatment well;Patient limited by pain Patient left: in bed;with call bell/phone within reach;with bed alarm set;with family/visitor present;with SCD's reapplied Nurse Communication: Mobility status;Weight bearing status PT Visit Diagnosis: History of falling (Z91.81);Muscle weakness (generalized) (M62.81);Other abnormalities of gait and mobility (R26.89);Pain Pain - Right/Left: Right Pain - part of body: Knee     Time: 1224-4975 PT Time Calculation (min) (ACUTE ONLY): 23 min  Charges:  $Therapeutic Exercise: 8-22 mins                    Chesley Noon, PTA 09/28/19, 11:29 AM

## 2019-09-28 NOTE — TOC Progression Note (Signed)
Transition of Care [facility]) - Progression Note    Patient Details  Name: Robin Espinoza MRN: [id number] Date of Birth: 11/01/1945  Transition of Care [facility]) CM/SW Contact  Su Hilt, RN Phone Number: 09/28/2019, 4:24 PM  Clinical Narrative:     Received a call from [facility] with insurance approval, the auth number is [id number], they declined EMS transport and the patient will pay out of pocket for transport, She will DC tomorrow to [facility].       Expected Discharge Plan and Services                                                 Social Determinants of Health (SDOH) Interventions    Readmission Risk Interventions No flowsheet data found.

## 2019-09-29 DIAGNOSIS — F339 Major depressive disorder, recurrent, unspecified: Secondary | ICD-10-CM | POA: Diagnosis not present

## 2019-09-29 DIAGNOSIS — I10 Essential (primary) hypertension: Secondary | ICD-10-CM | POA: Diagnosis present

## 2019-09-29 DIAGNOSIS — Z471 Aftercare following joint replacement surgery: Secondary | ICD-10-CM | POA: Diagnosis not present

## 2019-09-29 DIAGNOSIS — Z79890 Hormone replacement therapy (postmenopausal): Secondary | ICD-10-CM | POA: Diagnosis not present

## 2019-09-29 DIAGNOSIS — F39 Unspecified mood [affective] disorder: Secondary | ICD-10-CM | POA: Diagnosis not present

## 2019-09-29 DIAGNOSIS — Z96651 Presence of right artificial knee joint: Secondary | ICD-10-CM | POA: Diagnosis not present

## 2019-09-29 DIAGNOSIS — G4733 Obstructive sleep apnea (adult) (pediatric): Secondary | ICD-10-CM | POA: Diagnosis present

## 2019-09-29 DIAGNOSIS — Z87891 Personal history of nicotine dependence: Secondary | ICD-10-CM | POA: Diagnosis not present

## 2019-09-29 DIAGNOSIS — F411 Generalized anxiety disorder: Secondary | ICD-10-CM | POA: Diagnosis not present

## 2019-09-29 DIAGNOSIS — E559 Vitamin D deficiency, unspecified: Secondary | ICD-10-CM | POA: Diagnosis not present

## 2019-09-29 DIAGNOSIS — N3281 Overactive bladder: Secondary | ICD-10-CM | POA: Diagnosis present

## 2019-09-29 DIAGNOSIS — M2550 Pain in unspecified joint: Secondary | ICD-10-CM | POA: Diagnosis not present

## 2019-09-29 DIAGNOSIS — R2681 Unsteadiness on feet: Secondary | ICD-10-CM | POA: Diagnosis not present

## 2019-09-29 DIAGNOSIS — M1711 Unilateral primary osteoarthritis, right knee: Secondary | ICD-10-CM | POA: Diagnosis present

## 2019-09-29 DIAGNOSIS — M62831 Muscle spasm of calf: Secondary | ICD-10-CM | POA: Diagnosis not present

## 2019-09-29 DIAGNOSIS — R278 Other lack of coordination: Secondary | ICD-10-CM | POA: Diagnosis not present

## 2019-09-29 DIAGNOSIS — Z79899 Other long term (current) drug therapy: Secondary | ICD-10-CM | POA: Diagnosis not present

## 2019-09-29 DIAGNOSIS — Z7401 Bed confinement status: Secondary | ICD-10-CM | POA: Diagnosis not present

## 2019-09-29 DIAGNOSIS — Z741 Need for assistance with personal care: Secondary | ICD-10-CM | POA: Diagnosis not present

## 2019-09-29 DIAGNOSIS — M6281 Muscle weakness (generalized): Secondary | ICD-10-CM | POA: Diagnosis not present

## 2019-09-29 DIAGNOSIS — R262 Difficulty in walking, not elsewhere classified: Secondary | ICD-10-CM | POA: Diagnosis not present

## 2019-09-29 DIAGNOSIS — Z20822 Contact with and (suspected) exposure to covid-19: Secondary | ICD-10-CM | POA: Diagnosis present

## 2019-09-29 DIAGNOSIS — E7849 Other hyperlipidemia: Secondary | ICD-10-CM | POA: Diagnosis not present

## 2019-09-29 DIAGNOSIS — E785 Hyperlipidemia, unspecified: Secondary | ICD-10-CM | POA: Diagnosis present

## 2019-09-29 DIAGNOSIS — M109 Gout, unspecified: Secondary | ICD-10-CM | POA: Diagnosis not present

## 2019-09-29 DIAGNOSIS — K219 Gastro-esophageal reflux disease without esophagitis: Secondary | ICD-10-CM | POA: Diagnosis present

## 2019-09-29 DIAGNOSIS — E039 Hypothyroidism, unspecified: Secondary | ICD-10-CM | POA: Diagnosis present

## 2019-09-29 DIAGNOSIS — R5381 Other malaise: Secondary | ICD-10-CM | POA: Diagnosis not present

## 2019-09-29 MED ORDER — DOCUSATE SODIUM 100 MG PO CAPS: 100.0000 mg | ORAL_CAPSULE | Freq: Two times a day (BID) | ORAL | 0 refills | Status: DC

## 2019-09-29 MED ORDER — ENOXAPARIN SODIUM 40 MG/0.4ML ~~LOC~~ SOLN: 40.0000 mg | Freq: Two times a day (BID) | SUBCUTANEOUS | 0 refills | Status: DC

## 2019-09-29 MED ORDER — OXYCODONE HCL 5 MG PO TABS: 5.0000 mg | ORAL_TABLET | ORAL | 0 refills | Status: DC | PRN

## 2019-09-29 MED ORDER — BISACODYL 10 MG RE SUPP: 10.0000 mg | Freq: Every day | RECTAL | 0 refills | Status: DC | PRN

## 2019-09-29 MED ORDER — TRAMADOL HCL 50 MG PO TABS: 50.0000 mg | ORAL_TABLET | Freq: Four times a day (QID) | ORAL | 0 refills | Status: DC | PRN

## 2019-09-29 NOTE — Plan of Care (Signed)

## 2019-09-29 NOTE — Progress Notes (Signed)
Called report to [place] at [facility]. Answered all questions

## 2019-09-29 NOTE — Discharge Summary (Signed)
Physician Discharge Summary  Patient ID: Robin Espinoza MRN: [id number] DOB/AGE: Jun 13, 1945 74 y.o.  Admit date: 09/26/2019 Discharge date: 09/29/2019  Admission Diagnoses:  Unilateral Primary Osteoarthritis, Right Knee <principal problem not specified>  Discharge Diagnoses:  Unilateral Primary Osteoarthritis, Right Knee Active Problems:   S/P TKR (total knee replacement) using cement, right   Past Medical History:  Diagnosis Date   Anxiety    Depression    Family history of adverse reaction to anesthesia    sister had a reaction when had a baby   GERD (gastroesophageal reflux disease)    Hyperlipidemia 12/21/2005   Hypertension    Hypothyroidism    Insomnia    OAB (overactive bladder)    Obstructive sleep apnea    Osteoarthrosis    Vitamin D deficiency     Surgeries: Procedure(s): RIGHT TOTAL KNEE ARTHROPLASTY on 09/26/2019   Consultants (if any):   Discharged Condition: Improved  Hospital Course: Robin Espinoza is an 74 y.o. female who was admitted 09/26/2019 with a diagnosis of Unilateral Primary Osteoarthritis, Right Knee  and went to the operating room on 09/26/2019 and underwent an uncomplicated right total knee arthroplasty.    She was given perioperative antibiotics:  Anti-infectives (From admission, onward)   Start     Dose/Rate Route Frequency Ordered Stop   09/26/19 1420  ceFAZolin (ANCEF) 1-4 GM/50ML-% IVPB       Note to Pharmacy: Calton Dach   : cabinet override      09/26/19 1420 09/27/19 0229   09/26/19 1415  ceFAZolin (ANCEF) IVPB 1 g/50 mL premix        1 g 100 mL/hr over 30 Minutes Intravenous Every 6 hours 09/26/19 1410 09/26/19 2139   09/26/19 0624  ceFAZolin (ANCEF) 2-4 GM/100ML-% IVPB       Note to Pharmacy: Milinda Cave   : cabinet override      09/26/19 0624 09/26/19 0807   09/26/19 0623  clindamycin (CLEOCIN) 900 MG/50ML IVPB       Note to Pharmacy: Milinda Cave   : cabinet override      09/26/19 0623 09/26/19 0809    09/26/19 0615  clindamycin (CLEOCIN) IVPB 900 mg        900 mg 100 mL/hr over 30 Minutes Intravenous  Once 09/26/19 0609 09/26/19 0809   09/26/19 0615  clindamycin (CLEOCIN) IVPB 900 mg  Status:  Discontinued        900 mg 100 mL/hr over 30 Minutes Intravenous  Once 09/26/19 0609 09/26/19 1446   09/26/19 0615  ceFAZolin (ANCEF) IVPB 2g/100 mL premix        2 g 200 mL/hr over 30 Minutes Intravenous On call to O.R. 09/26/19 1749 09/26/19 0805   09/26/19 0615  ceFAZolin (ANCEF) IVPB 2g/100 mL premix  Status:  Discontinued        2 g 200 mL/hr over 30 Minutes Intravenous On call to O.R. 09/26/19 4496 09/26/19 1446    .  She was given sequential compression devices, early ambulation, and Lovenox for DVT prophylaxis.  The patient did well postoperatively.  She remained stable throughout her hospitalization.  Labs postop were within normal limits.  Patient made progress with physical therapy.  Given her clinical improvement she was prepared for discharge to rehab on postop day #3.  She benefited maximally from the hospital stay and there were no complications.    Recent vital signs:  Vitals:   09/29/19 0027 09/29/19 0730  BP: 109/67 (!) 160/87  Pulse: 63 62  Resp: 18 17  Temp: 98.9 F (37.2 C) 97.6 F (36.4 C)  SpO2: 99% 100%    Recent laboratory studies:  Lab Results  Component Value Date   HGB 13.1 09/27/2019   HGB 15.4 (H) 09/22/2019   HGB 15.4 04/07/2019   Lab Results  Component Value Date   WBC 11.8 (H) 09/27/2019   PLT 152 09/27/2019   Lab Results  Component Value Date   INR 1.0 09/22/2019   Lab Results  Component Value Date   NA 141 09/27/2019   K 3.8 09/27/2019   CL 104 09/27/2019   CO2 29 09/27/2019   BUN 12 09/27/2019   CREATININE 0.85 09/27/2019   GLUCOSE 95 09/27/2019    Discharge Medications:   Allergies as of 09/29/2019      Reactions   Amlodipine    Ankle swelling       Medication List    STOP taking these medications   etodolac 400 MG  tablet Commonly known as: LODINE     TAKE these medications   acetaminophen 650 MG CR tablet Commonly known as: TYLENOL Take 1,300 mg by mouth every 8 (eight) hours as needed for pain.   amLODipine 5 MG tablet Commonly known as: NORVASC TAKE 1 TABLET BY MOUTH EVERY DAY   bisacodyl 10 MG suppository Commonly known as: DULCOLAX Place 1 suppository (10 mg total) rectally daily as needed for moderate constipation.   clotrimazole-betamethasone cream Commonly known as: LOTRISONE Apply 1 application topically 2 (two) times daily. What changed:   when to take this  reasons to take this   docusate sodium 100 MG capsule Commonly known as: COLACE Take 1 capsule (100 mg total) by mouth 2 (two) times daily.   enoxaparin 40 MG/0.4ML injection Commonly known as: LOVENOX Inject 0.4 mLs (40 mg total) into the skin every 12 (twelve) hours.   FIBER-CAPS PO Take 2 capsules by mouth daily.   levothyroxine 112 MCG tablet Commonly known as: SYNTHROID TAKE 1 TABLET (112 MCG TOTAL) BY MOUTH DAILY BEFORE BREAKFAST. What changed: See the new instructions.   lovastatin 40 MG tablet Commonly known as: MEVACOR TAKE 1 TABLET BY MOUTH EVERYDAY AT BEDTIME What changed: See the new instructions.   metoprolol succinate 100 MG 24 hr tablet Commonly known as: TOPROL-XL TAKE 1 TABLET (100 MG TOTAL) BY MOUTH DAILY. TAKE WITH OR IMMEDIATELY FOLLOWING A MEAL.   multivitamin capsule Take 1 capsule by mouth daily.   NON FORMULARY CPAP   omeprazole 20 MG capsule Commonly known as: PRILOSEC TAKE 1 CAPSULE BY MOUTH EVERY DAY What changed: how much to take   OVER THE COUNTER MEDICATION Take 2 tablets by mouth daily. Heal and soothe otc supplement   oxybutynin 10 MG 24 hr tablet Commonly known as: DITROPAN-XL TAKE 1 TABLET BY MOUTH EVERYDAY AT BEDTIME What changed:   how much to take  how to take this  when to take this  additional instructions   oxyCODONE 5 MG immediate release  tablet Commonly known as: Oxy IR/ROXICODONE Take 1-2 tablets (5-10 mg total) by mouth every 4 (four) hours as needed for severe pain (pain score 4-6).   phentermine 37.5 MG tablet Commonly known as: ADIPEX-P Take 1 tablet (37.5 mg total) by mouth daily before breakfast.   THERATEARS OP Place 1 drop into both eyes daily as needed (dry eyes).   traMADol 50 MG tablet Commonly known as: ULTRAM Take 1 tablet (50 mg total) by mouth every 6 (six) hours as needed for moderate  pain.   traZODone 50 MG tablet Commonly known as: DESYREL Take 2 tablets (100 mg total) by mouth daily. What changed: when to take this   venlafaxine XR 75 MG 24 hr capsule Commonly known as: EFFEXOR-XR TAKE 2 CAPSULES (150 MG TOTAL) BY MOUTH DAILY WITH BREAKFAST.   Vitamin D (Ergocalciferol) 1.25 MG (50000 UNIT) Caps capsule Commonly known as: DRISDOL TAKE 1 CAPSULE (50,000 UNITS TOTAL) BY MOUTH EVERY 7 (SEVEN) DAYS. What changed: when to take this       Diagnostic Studies: Chest 2 View  Result Date: 09/22/2019 CLINICAL DATA:  Preoperative examination for right knee replacement, no symptoms EXAM: CHEST - 2 VIEW COMPARISON:  Radiograph 05/02/2012, CT 08/23/2007 FINDINGS: No consolidation, features of edema, pneumothorax, or effusion. Pulmonary vascularity is normally distributed. The cardiomediastinal contours are unremarkable. No acute osseous or soft tissue abnormality. Moderate to severe degenerative changes are present in the imaged spine and shoulders. IMPRESSION: No acute cardiopulmonary abnormality. Electronically Signed   By: Lovena Le M.D.   On: 09/22/2019 23:43   DG Knee Right Port  Result Date: 09/26/2019 CLINICAL DATA:  Status post right knee replacement. EXAM: PORTABLE RIGHT KNEE - 1-2 VIEW COMPARISON:  May 02, 2012. FINDINGS: The right femoral and tibial components appear to be well situated. Expected postoperative changes are noted in the soft tissues anteriorly. IMPRESSION: Status post right  total knee arthroplasty. Electronically Signed   By: Marijo Conception M.D.   On: 09/26/2019 13:01    Disposition: Discharge disposition: 03-Skilled Nursing Facility       Discharge Instructions    Call MD / Call 911   Complete by: As directed    If you experience chest pain or shortness of breath, CALL 911 and be transported to the hospital emergency room.  If you develope a fever above 101 F, pus (white drainage) or increased drainage or redness at the wound, or calf pain, call your surgeon's office.   Constipation Prevention   Complete by: As directed    Drink plenty of fluids.  Prune juice may be helpful.  You may use a stool softener, such as Colace (over the counter) 100 mg twice a day.  Use MiraLax (over the counter) for constipation as needed.   Diet general   Complete by: As directed    Discharge instructions   Complete by: As directed    The patient may continue to bear weight on the right lower extremity with use of a walker. The patient should continue to use TED stockings until follow-up. Patient should remove the TED stockings at night for sleep. The patient needs to continue to elevate the right lower extremity whenever possible. The knee immobilizer should be used at night. The patient may remove the knee immobilizer to perform exercises or sit in a chair during the day.  Patient should not place a pillow under her knee. The Polar Care may be used by the patient for comfort.  The dressing should remain on until follow up in the office.   The patient must cover the right knee dressing/incision during showers with a plastic bag or Saran wrap.  The patient will take lovenox 40 mg injection a day for blood clot prevention until follow-up in the office on 10/11/19 @ 11:30AM.   Driving restrictions   Complete by: As directed    No driving for 3-41 weeks   Increase activity slowly as tolerated   Complete by: As directed    Lifting restrictions   Complete by:  As directed    No  lifting for 12-16 weeks       Contact information for after-discharge care    Destination    [facility] .   Service: Skilled Nursing Contact information: [place] [phone]                 [place] IN THE OFFICE ON 10/11/19 @ 11:30AM.   Signed: Thornton Park ,MD 09/29/2019, 9:52 AM

## 2019-09-29 NOTE — Progress Notes (Signed)
  Subjective:  POD #3 s/p right total knee arthroplasty.   Patient reports right knee pain as mild to moderate.  Patient sitting up eating breakfast in bed and complains of mild heartburn.  Objective:   VITALS:   Vitals:   09/28/19 1004 09/28/19 1537 09/29/19 0027 09/29/19 0730  BP:  (!) 152/74 109/67 (!) 160/87  Pulse: 67 69 63 62  Resp:  17 18 17   Temp:  98.5 F (36.9 C) 98.9 F (37.2 C) 97.6 F (36.4 C)  TempSrc:  Oral Oral Oral  SpO2: 96% 100% 99% 100%  Weight:      Height:        PHYSICAL EXAM: Right lower extremity Neurovascular intact Sensation intact distally Intact pulses distally Dorsiflexion/Plantar flexion intact Incision: dressing C/D/I No cellulitis present Compartment soft  LABS  No results found for this or any previous visit (from the past 24 hour(s)).  No results found.  Assessment/Plan: 3 Days Post-Op   Active Problems:   S/P TKR (total knee replacement) using cement, right  Patient remains stable postop.  We will continue with her physical therapy.  Patient will continue Lovenox 40 mg daily for DVT prophylaxis x4 weeks.  Patient will be discharged to [facility] today.  Thornton Park , MD 09/29/2019, 9:50 AM

## 2019-09-29 NOTE — TOC Progression Note (Signed)
Transition of Care [facility]) - Progression Note    Patient Details  Name: Robin Espinoza MRN: [id number] Date of Birth: 08/23/1945  Transition of Care [facility]) CM/SW [place], RN Phone Number: 09/29/2019, 1:00 PM  Clinical Narrative:    Bedside Nurse called report to facility, EMS has been called by [facility] Family aware        Expected Discharge Plan and Services           Expected Discharge Date: 09/29/19                                     Social Determinants of Health (SDOH) Interventions    Readmission Risk Interventions No flowsheet data found.

## 2019-09-29 NOTE — Progress Notes (Addendum)
Physical Therapy Treatment Patient Details Name: Robin Espinoza MRN: [id number] DOB: 1945-12-03 Today's Date: 09/29/2019    History of Present Illness Pt is a 74 yo female diagnosed with OA of the R knee and is s/p R TKA.  PMH includes anxiety, depression, hypothyroidism and HTN.    PT Comments    The patient reported minimal pain at rest, agreeable to exercises initially but with time and motivation, agreeable to mobilize as well. Exercises with AAROM, poor quad activation noted. The patient was able to supine <> sit modA, and return with minAx2 for safety. Assistance needed for LE management. maxAx2 for sit <> stand and stabilization of RW. Significant time needed for patient to come into fully standing. Reliant on maxAx2 to allow for repositioning of feet/hands. Cued for upright posture and feet placement. Pt able to side step at EOB to L several feet. improved foot clearance with repetition noted. Pt in bed, with bedpan at pt request in place and CNA notified of patient status. The patient would benefit from further skilled PT intervention to continue to progress towards goals. Recommendation remains appropriate.    Follow Up Recommendations  SNF     Equipment Recommendations  Rolling walker with 5" wheels    Recommendations for Other Services       Precautions / Restrictions Precautions Precautions: Fall Precaution Comments: KI to RLE except with CPM or with PT Required Braces or Orthoses: Knee Immobilizer - Right Restrictions Weight Bearing Restrictions: Yes RLE Weight Bearing: Weight bearing as tolerated    Mobility  Bed Mobility Overal bed mobility: Needs Assistance Bed Mobility: Supine to Sit;Sit to Supine     Supine to sit: Mod assist;HOB elevated Sit to supine: Min assist;+2 for safety/equipment   General bed mobility comments: L ankle under R ankle to assist RLE, assistance needed for LE control. return to supine with 2+ for safety  Transfers Overall transfer  level: Needs assistance Equipment used: Rolling walker (2 wheeled) Transfers: Sit to/from Stand Sit to Stand: Max assist;+2 physical assistance         General transfer comment: significant time needed for patient to come into fully standing. reliant on maxAx2 to allow for repositioning of feet/hands. Cued for upright posture and feet placement.  Ambulation/Gait Ambulation/Gait assistance: Min assist;+2 physical assistance;+2 safety/equipment Gait Distance (Feet): 3 Feet Assistive device: Rolling walker (2 wheeled)       General Gait Details: Pt able to side step at EOB to L several feet. improved foot clearance with repetition noted.   Stairs             Wheelchair Mobility    Modified Rankin (Stroke Patients Only)       Balance Overall balance assessment: Needs assistance Sitting-balance support: Feet supported Sitting balance-Leahy Scale: Good Sitting balance - Comments: able to scoot anteriorly towards EOB with supervision and use of UEs                                    Cognition Arousal/Alertness: Awake/alert Behavior During Therapy: WFL for tasks assessed/performed Overall Cognitive Status: Within Functional Limits for tasks assessed                                        Exercises Total Joint Exercises Ankle Circles/Pumps: AROM;Strengthening;Both;10 reps Quad Sets: AROM;Strengthening;Right;10 reps Short Arc Quad:  AAROM;Strengthening;Right;10 reps Heel Slides: AAROM;Strengthening;Right;10 reps Hip ABduction/ADduction: AAROM;Strengthening;Right;10 reps    General Comments        Pertinent Vitals/Pain Pain Assessment: Faces Faces Pain Scale: Hurts a little bit Pain Location: with R knee flexion Pain Descriptors / Indicators: Grimacing Pain Intervention(s): Limited activity within patient's tolerance;Monitored during session;Repositioned    Home Living                      Prior Function             PT Goals (current goals can now be found in the care plan section) Progress towards PT goals: Progressing toward goals (slowly)    Frequency    BID      PT Plan Current plan remains appropriate    Co-evaluation              AM-PAC PT "6 Clicks" Mobility   Outcome Measure  Help needed turning from your back to your side while in a flat bed without using bedrails?: A Lot Help needed moving from lying on your back to sitting on the side of a flat bed without using bedrails?: A Lot Help needed moving to and from a bed to a chair (including a wheelchair)?: A Lot Help needed standing up from a chair using your arms (e.g., wheelchair or bedside chair)?: A Lot Help needed to walk in hospital room?: Total Help needed climbing 3-5 steps with a railing? : Total 6 Click Score: 10    End of Session Equipment Utilized During Treatment: Gait belt Activity Tolerance: Patient limited by pain;Other (comment) (pt fearful with mobility) Patient left: in bed;with call bell/phone within reach;with bed alarm set;with family/visitor present (bedpan in place, tech notified) Nurse Communication: Mobility status PT Visit Diagnosis: History of falling (Z91.81);Muscle weakness (generalized) (M62.81);Other abnormalities of gait and mobility (R26.89);Pain Pain - Right/Left: Right Pain - part of body: Knee     Time: 1015-1055 PT Time Calculation (min) (ACUTE ONLY): 40 min  Charges:  $Therapeutic Exercise: 38-52 mins                     Lieutenant Diego PT, DPT 11:09 AM,09/29/19

## 2019-10-02 DIAGNOSIS — M1711 Unilateral primary osteoarthritis, right knee: Secondary | ICD-10-CM

## 2019-10-02 DIAGNOSIS — I10 Essential (primary) hypertension: Secondary | ICD-10-CM

## 2019-10-02 DIAGNOSIS — K219 Gastro-esophageal reflux disease without esophagitis: Secondary | ICD-10-CM

## 2019-10-02 DIAGNOSIS — F39 Unspecified mood [affective] disorder: Secondary | ICD-10-CM

## 2019-10-09 DIAGNOSIS — Z96651 Presence of right artificial knee joint: Secondary | ICD-10-CM | POA: Diagnosis not present

## 2019-10-09 DIAGNOSIS — M62831 Muscle spasm of calf: Secondary | ICD-10-CM | POA: Diagnosis not present

## 2019-10-11 ENCOUNTER — Other Ambulatory Visit: Payer: Self-pay | Admitting: Physician Assistant

## 2019-10-11 DIAGNOSIS — M1711 Unilateral primary osteoarthritis, right knee: Secondary | ICD-10-CM | POA: Diagnosis not present

## 2019-10-11 DIAGNOSIS — F5101 Primary insomnia: Secondary | ICD-10-CM

## 2019-10-11 NOTE — Telephone Encounter (Signed)
Requested Prescriptions  Pending Prescriptions Disp Refills  . traZODone (DESYREL) 50 MG tablet [Pharmacy Med Name: TRAZODONE 50 MG TABLET] 180 tablet 1    Sig: TAKE 2 TABLETS BY MOUTH EVERY DAY     Psychiatry: Antidepressants - Serotonin Modulator Passed - 10/11/2019  1:24 AM      Passed - Valid encounter within last 6 months    Recent Outpatient Visits          1 month ago Pre-op exam   [facility] [name], [place]   2 months ago Foot swelling   [facility] [place], [place], [place]   3 months ago Bilateral impacted cerumen   [place], [place], [place]   5 months ago Annual physical exam   [facility] [name], PA-[place]   6 months ago Class 3 severe obesity due to excess calories with serious comorbidity and body mass index (BMI) of 40.0 to 44.9 in adult [facility])   [facility] [place], [place], [place]

## 2019-10-14 DIAGNOSIS — M1711 Unilateral primary osteoarthritis, right knee: Secondary | ICD-10-CM | POA: Diagnosis not present

## 2019-10-18 DIAGNOSIS — Z96651 Presence of right artificial knee joint: Secondary | ICD-10-CM | POA: Diagnosis not present

## 2019-10-18 DIAGNOSIS — Z471 Aftercare following joint replacement surgery: Secondary | ICD-10-CM | POA: Diagnosis not present

## 2019-10-18 DIAGNOSIS — F329 Major depressive disorder, single episode, unspecified: Secondary | ICD-10-CM | POA: Diagnosis not present

## 2019-10-18 DIAGNOSIS — F419 Anxiety disorder, unspecified: Secondary | ICD-10-CM | POA: Diagnosis not present

## 2019-10-18 DIAGNOSIS — I10 Essential (primary) hypertension: Secondary | ICD-10-CM | POA: Diagnosis not present

## 2019-10-18 DIAGNOSIS — R2681 Unsteadiness on feet: Secondary | ICD-10-CM | POA: Diagnosis not present

## 2019-10-19 NOTE — Progress Notes (Signed)
Established patient visit   Patient: Robin Espinoza   DOB: February 08, 1946   74 y.o. Female  MRN: [id number] Visit Date: 10/20/2019  Today's healthcare provider: Mar Daring, PA-C   Chief Complaint  Patient presents with  . Follow-up   Subjective    HPI  Patient was discharged from [facility] a week ago. She is doing well after the knee surgery. She reports that now she is constipated. She reports that this morning she almost pass out. She reports that she was sitting and she just fell over. She feels dizzy with movements. For the constipation she has been using Colace and taking Miralax once a day.  She reports that when the physical therapy came over on Monday when they checked her blood pressure was low. She just remembers that the bottom number was in 60's.   Patient Active Problem List   Diagnosis Date Noted  . S/P TKR (total knee replacement) using cement, 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  . BMI 40.0-44.9, adult ([place]) 04/15/2018  . Avitaminosis D 02/28/2015  . Hypothyroidism 11/13/2014  . Anxiety disorder 10/15/2014  . Insomnia 09/21/2014  . Acid reflux 11/30/2007  . Arthritis, degenerative 09/16/2007  . Fam hx-ischem heart disease 02/12/2007  . Adiposity 02/09/2007  . Obstructive apnea 05/06/2006  . 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 BP (high blood pressure) 03/23/1998   Past Medical History:  Diagnosis Date  . Anxiety   . Depression   . Family history of adverse reaction to anesthesia    sister had a reaction when had a baby  . GERD (gastroesophageal reflux disease)   . Hyperlipidemia 12/21/2005  . Hypertension   . Hypothyroidism   . Insomnia   . OAB (overactive bladder)   . Obstructive sleep apnea   . Osteoarthrosis   . Vitamin D deficiency        Medications: Outpatient Medications Prior to Visit  Medication Sig  . acetaminophen (TYLENOL) 650 MG CR tablet Take 1,300 mg by mouth every 8 (eight) hours as needed for pain.  .  Calcium Polycarbophil (FIBER-CAPS PO) Take 2 capsules by mouth daily.  . clotrimazole-betamethasone (LOTRISONE) cream Apply 1 application topically 2 (two) times daily. (Patient taking differently: Apply 1 application topically 2 (two) times daily as needed (rash). )  . docusate sodium (COLACE) 100 MG capsule Take 1 capsule (100 mg total) by mouth 2 (two) times daily.  Marland Kitchen levothyroxine (SYNTHROID) 112 MCG tablet TAKE 1 TABLET (112 MCG TOTAL) BY MOUTH DAILY BEFORE BREAKFAST. (Patient taking differently: Take 112 mcg by mouth daily before breakfast. )  . lovastatin (MEVACOR) 40 MG tablet TAKE 1 TABLET BY MOUTH EVERYDAY AT BEDTIME (Patient taking differently: Take 40 mg by mouth at bedtime. )  . metoprolol succinate (TOPROL-XL) 100 MG 24 hr tablet TAKE 1 TABLET (100 MG TOTAL) BY MOUTH DAILY. TAKE WITH OR IMMEDIATELY FOLLOWING A MEAL.  . NON FORMULARY CPAP  . omeprazole (PRILOSEC) 20 MG capsule TAKE 1 CAPSULE BY MOUTH EVERY DAY (Patient taking differently: Take 20 mg by mouth daily. )  . OVER THE COUNTER MEDICATION Take 2 tablets by mouth daily. Heal and soothe otc supplement  . Polyethylene Glycol 3350 (MIRALAX PO) Take by mouth.  . traZODone (DESYREL) 50 MG tablet TAKE 2 TABLETS BY MOUTH EVERY DAY  . venlafaxine XR (EFFEXOR-XR) 75 MG 24 hr capsule TAKE 2 CAPSULES (150 MG TOTAL) BY MOUTH DAILY WITH BREAKFAST.  Marland Kitchen Vitamin D, Ergocalciferol, (DRISDOL) 1.25 MG (50000 UNIT) CAPS capsule TAKE  1 CAPSULE (50,000 UNITS TOTAL) BY MOUTH EVERY 7 (SEVEN) DAYS. (Patient taking differently: Take 50,000 Units by mouth every Sunday. )  . amLODipine (NORVASC) 5 MG tablet TAKE 1 TABLET BY MOUTH EVERY DAY  . bisacodyl (DULCOLAX) 10 MG suppository Place 1 suppository (10 mg total) rectally daily as needed for moderate constipation. (Patient not taking: Reported on 10/20/2019)  . Carboxymethylcellulose Sodium (THERATEARS OP) Place 1 drop into both eyes daily as needed (dry eyes).  . enoxaparin (LOVENOX) 40 MG/0.4ML injection  Inject 0.4 mLs (40 mg total) into the skin every 12 (twelve) hours.  . Multiple Vitamin (MULTIVITAMIN) capsule Take 1 capsule by mouth daily.  Marland Kitchen oxybutynin (DITROPAN-XL) 10 MG 24 hr tablet TAKE 1 TABLET BY MOUTH EVERYDAY AT BEDTIME  . oxyCODONE (OXY IR/ROXICODONE) 5 MG immediate release tablet Take 1-2 tablets (5-10 mg total) by mouth every 4 (four) hours as needed for severe pain (pain score 4-6).  Marland Kitchen phentermine (ADIPEX-P) 37.5 MG tablet Take 1 tablet (37.5 mg total) by mouth daily before breakfast. (Patient not taking: Reported on 08/22/2019)  . traMADol (ULTRAM) 50 MG tablet Take 1 tablet (50 mg total) by mouth every 6 (six) hours as needed for moderate pain. (Patient not taking: Reported on 10/20/2019)   No facility-administered medications prior to visit.    Review of Systems  Constitutional: Negative.   Respiratory: Negative.   Cardiovascular: Positive for leg swelling ("some" but better). Negative for chest pain and palpitations.  Gastrointestinal: Positive for constipation.  Neurological: Positive for dizziness.    Last CBC Lab Results  Component Value Date   WBC 11.8 (H) 09/27/2019   HGB 13.1 09/27/2019   HCT 39.5 09/27/2019   MCV 98.8 09/27/2019   MCH 32.8 09/27/2019   RDW 13.4 09/27/2019   PLT 152 21/19/4174   Last metabolic panel Lab Results  Component Value Date   GLUCOSE 95 09/27/2019   NA 141 09/27/2019   K 3.8 09/27/2019   CL 104 09/27/2019   CO2 29 09/27/2019   BUN 12 09/27/2019   CREATININE 0.85 09/27/2019   GFRNONAA >60 09/27/2019   GFRAA >60 09/27/2019   CALCIUM 8.4 (L) 09/27/2019   PROT 6.6 04/19/2018   ALBUMIN 3.9 04/19/2018   LABGLOB 2.7 04/19/2018   AGRATIO 1.4 04/19/2018   BILITOT 0.3 04/19/2018   ALKPHOS 116 04/19/2018   AST 14 04/19/2018   ALT 15 04/19/2018   ANIONGAP 8 09/27/2019      Objective    BP (!) 136/90 (BP Location: Left Arm, Patient Position: Sitting, Cuff Size: Large)   Pulse 76   Temp 98.6 F (37 C) (Oral)   Resp 16   Wt  (!) 240 lb (108.9 kg) Comment: yesterday at home  BMI 39.94 kg/m  BP Readings from Last 3 Encounters:  10/20/19 (!) 136/90  09/29/19 (!) 149/71  08/22/19 134/86   Wt Readings from Last 3 Encounters:  10/20/19 (!) 240 lb (108.9 kg)  09/26/19 246 lb 4.1 oz (111.7 kg)  09/22/19 246 lb 3.2 oz (111.7 kg)      Physical Exam Vitals reviewed.  Constitutional:      General: She is not in acute distress.    Appearance: Normal appearance. She is well-developed. She is obese. She is not ill-appearing or diaphoretic.  Neck:     Thyroid: No thyromegaly.     Vascular: No JVD.     Trachea: No tracheal deviation.  Cardiovascular:     Rate and Rhythm: Normal rate and regular rhythm.  Pulses: Normal pulses.     Heart sounds: Normal heart sounds. No murmur heard.  No friction rub. No gallop.   Pulmonary:     Effort: Pulmonary effort is normal. No respiratory distress.     Breath sounds: Normal breath sounds. No wheezing or rales.  Musculoskeletal:     Cervical back: Normal range of motion and neck supple.     Right lower leg: No edema.     Left lower leg: No edema.  Lymphadenopathy:     Cervical: No cervical adenopathy.  Skin:    General: Skin is warm and dry.     Capillary Refill: Capillary refill takes less than 2 seconds.  Neurological:     General: No focal deficit present.     Mental Status: She is alert.       No results found for any visits on 10/20/19.  Assessment & Plan     1. Status post right knee replacement Incision healing well. ROM is coming along nicely. Continue PT.   2. Orthostatic dizziness Secondary to dehydration. Push fluids.   3. Drug-induced constipation Secondary to pain medications and anesthesia. Dehydration complicating. Push fluids. Continue stool softener and miralax. If not improving call and will consider adding Movantik or relistor.    No follow-ups on file.      Reynolds Bowl, PA-C, have reviewed all documentation for this  visit. The documentation on 10/25/19 for the exam, diagnosis, procedures, and orders are all accurate and complete.   Rubye Beach  [facility] [phone] (phone) [phone] (fax)  [place]

## 2019-10-20 ENCOUNTER — Other Ambulatory Visit: Payer: Self-pay

## 2019-10-20 ENCOUNTER — Encounter: Payer: Self-pay | Admitting: Physician Assistant

## 2019-10-20 ENCOUNTER — Ambulatory Visit (INDEPENDENT_AMBULATORY_CARE_PROVIDER_SITE_OTHER): Payer: PPO | Admitting: Physician Assistant

## 2019-10-20 VITALS — BP 136/90 | HR 76 | Temp 98.6°F | Resp 16 | Wt 240.0 lb

## 2019-10-20 DIAGNOSIS — Z96651 Presence of right artificial knee joint: Secondary | ICD-10-CM | POA: Diagnosis not present

## 2019-10-20 DIAGNOSIS — K5903 Drug induced constipation: Secondary | ICD-10-CM

## 2019-10-20 DIAGNOSIS — R42 Dizziness and giddiness: Secondary | ICD-10-CM | POA: Diagnosis not present

## 2019-10-20 NOTE — Patient Instructions (Addendum)
Gabapenitn or Requip for restless leg

## 2019-10-24 DIAGNOSIS — Z96651 Presence of right artificial knee joint: Secondary | ICD-10-CM | POA: Diagnosis not present

## 2019-10-24 DIAGNOSIS — F419 Anxiety disorder, unspecified: Secondary | ICD-10-CM | POA: Diagnosis not present

## 2019-10-24 DIAGNOSIS — R2681 Unsteadiness on feet: Secondary | ICD-10-CM | POA: Diagnosis not present

## 2019-10-24 DIAGNOSIS — Z471 Aftercare following joint replacement surgery: Secondary | ICD-10-CM | POA: Diagnosis not present

## 2019-10-24 DIAGNOSIS — F329 Major depressive disorder, single episode, unspecified: Secondary | ICD-10-CM | POA: Diagnosis not present

## 2019-10-24 DIAGNOSIS — I10 Essential (primary) hypertension: Secondary | ICD-10-CM | POA: Diagnosis not present

## 2019-10-25 ENCOUNTER — Encounter: Payer: Self-pay | Admitting: Physician Assistant

## 2019-11-06 DIAGNOSIS — Z96651 Presence of right artificial knee joint: Secondary | ICD-10-CM | POA: Diagnosis not present

## 2019-11-06 DIAGNOSIS — M1712 Unilateral primary osteoarthritis, left knee: Secondary | ICD-10-CM | POA: Diagnosis not present

## 2019-11-08 ENCOUNTER — Other Ambulatory Visit: Payer: Self-pay | Admitting: Physician Assistant

## 2019-11-08 DIAGNOSIS — E039 Hypothyroidism, unspecified: Secondary | ICD-10-CM

## 2019-11-08 NOTE — Telephone Encounter (Signed)
Requested Prescriptions  Pending Prescriptions Disp Refills  . levothyroxine (SYNTHROID) 112 MCG tablet [Pharmacy Med Name: LEVOTHYROXINE 112 MCG TABLET] 90 tablet 1    Sig: TAKE 1 TABLET (112 MCG TOTAL) BY MOUTH DAILY BEFORE BREAKFAST.     Endocrinology:  Hypothyroid Agents Failed - 11/08/2019  1:20 AM      Failed - TSH needs to be rechecked within 3 months after an abnormal result. Refill until TSH is due.      Passed - TSH in normal range and within 360 days    TSH  Date Value Ref Range Status  04/07/2019 3.100 0.450 - 4.500 uIU/mL Final         Passed - Valid encounter within last 12 months    Recent Outpatient Visits          2 weeks ago Status post right knee replacement   [place], Clearnce Sorrel, [place]   2 months ago Pre-op exam   [facility] [name], [place]   3 months ago Foot swelling   [place], [place], [place]   4 months ago Bilateral impacted cerumen   [facility] [place], Clearnce Sorrel, [place]   6 months ago Annual physical exam   [facility] [name] M, [place]

## 2019-11-14 ENCOUNTER — Other Ambulatory Visit: Payer: Self-pay | Admitting: Physician Assistant

## 2019-11-14 DIAGNOSIS — F32A Depression, unspecified: Secondary | ICD-10-CM

## 2019-11-14 NOTE — Telephone Encounter (Signed)
Requested Prescriptions  Pending Prescriptions Disp Refills  . venlafaxine XR (EFFEXOR-XR) 75 MG 24 hr capsule [Pharmacy Med Name: VENLAFAXINE HCL ER 75 MG CAP] 180 capsule 1    Sig: TAKE 2 CAPSULES (150 MG TOTAL) BY MOUTH DAILY WITH BREAKFAST.     Psychiatry: Antidepressants - SNRI - desvenlafaxine & venlafaxine Failed - 11/14/2019  1:28 AM      Failed - LDL in normal range and within 360 days    LDL Chol Calc (NIH)  Date Value Ref Range Status  04/07/2019 93 0 - 99 mg/dL Final         Failed - Triglycerides in normal range and within 360 days    Triglycerides  Date Value Ref Range Status  04/07/2019 161 (H) 0 - 149 mg/dL Final         Failed - Last BP in normal range    BP Readings from Last 1 Encounters:  10/20/19 (!) 136/90         Passed - Total Cholesterol in normal range and within 360 days    Cholesterol, Total  Date Value Ref Range Status  04/07/2019 177 100 - 199 mg/dL Final         Passed - Valid encounter within last 6 months    Recent Outpatient Visits          3 weeks ago Status post right knee replacement   [place], Clearnce Sorrel, [place]   2 months ago Pre-op exam   [facility] [name] M, [place]   3 months ago Foot swelling   [place], [place], [place]   4 months ago Bilateral impacted cerumen   [place], [place], [place]   6 months ago Annual physical exam   [place], [place], [place]

## 2019-11-16 ENCOUNTER — Other Ambulatory Visit: Payer: Self-pay

## 2019-11-16 ENCOUNTER — Ambulatory Visit (INDEPENDENT_AMBULATORY_CARE_PROVIDER_SITE_OTHER): Payer: PPO | Admitting: Physician Assistant

## 2019-11-16 ENCOUNTER — Encounter: Payer: Self-pay | Admitting: Physician Assistant

## 2019-11-16 VITALS — BP 147/90 | HR 68 | Temp 98.9°F | Wt 244.0 lb

## 2019-11-16 DIAGNOSIS — L659 Nonscarring hair loss, unspecified: Secondary | ICD-10-CM | POA: Diagnosis not present

## 2019-11-16 NOTE — Progress Notes (Signed)
Established patient visit   Patient: Robin Espinoza   DOB: Aug 04, 1945   74 y.o. Female  MRN: [id number] Visit Date: 11/16/2019  Today's healthcare provider: Mar Daring, PA-C   Chief Complaint  Patient presents with  . Alopecia   Subjective    HPI  Hair loss Patient presents today c/o hair loss. She reports that she has had excessive shedding and thinning of her hair. She reports that her hairdresser recommended that she have her thyroid levels checked. Patient is currently taking levothyroxine 162mcg daily.   She did recently have knee replacement surgery and is scheduled to have another in the coming few weeks.   Lab Results  Component Value Date   TSH 1.350 11/16/2019   T4TOTAL 8.7 02/06/2016      Patient Active Problem List   Diagnosis Date Noted  . S/P TKR (total knee replacement) using cement, 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  . BMI 40.0-44.9, adult ([place]) 04/15/2018  . Avitaminosis D 02/28/2015  . Hypothyroidism 11/13/2014  . Anxiety disorder 10/15/2014  . Insomnia 09/21/2014  . Acid reflux 11/30/2007  . Arthritis, degenerative 09/16/2007  . Fam hx-ischem heart disease 02/12/2007  . Adiposity 02/09/2007  . Obstructive apnea 05/06/2006  . 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 BP (high blood pressure) 03/23/1998   Past Medical History:  Diagnosis Date  . Anxiety   . Depression   . Family history of adverse reaction to anesthesia    sister had a reaction when had a baby  . GERD (gastroesophageal reflux disease)   . Hyperlipidemia 12/21/2005  . Hypertension   . Hypothyroidism   . Insomnia   . OAB (overactive bladder)   . Obstructive sleep apnea   . Osteoarthrosis   . Vitamin D deficiency        Medications: Outpatient Medications Prior to Visit  Medication Sig  . acetaminophen (TYLENOL) 650 MG CR tablet Take 1,300 mg by mouth every 8 (eight) hours as needed for pain.  . Calcium Polycarbophil (FIBER-CAPS PO) Take 2 capsules by  mouth daily.  . Carboxymethylcellulose Sodium (THERATEARS OP) Place 1 drop into both eyes daily as needed (dry eyes).  . clotrimazole-betamethasone (LOTRISONE) cream Apply 1 application topically 2 (two) times daily. (Patient taking differently: Apply 1 application topically 2 (two) times daily as needed (rash). )  . docusate sodium (COLACE) 100 MG capsule Take 1 capsule (100 mg total) by mouth 2 (two) times daily.  Marland Kitchen levothyroxine (SYNTHROID) 112 MCG tablet TAKE 1 TABLET (112 MCG TOTAL) BY MOUTH DAILY BEFORE BREAKFAST.  Marland Kitchen lovastatin (MEVACOR) 40 MG tablet TAKE 1 TABLET BY MOUTH EVERYDAY AT BEDTIME (Patient taking differently: Take 40 mg by mouth at bedtime. )  . metoprolol succinate (TOPROL-XL) 100 MG 24 hr tablet TAKE 1 TABLET (100 MG TOTAL) BY MOUTH DAILY. TAKE WITH OR IMMEDIATELY FOLLOWING A MEAL.  . NON FORMULARY CPAP  . omeprazole (PRILOSEC) 20 MG capsule TAKE 1 CAPSULE BY MOUTH EVERY DAY (Patient taking differently: Take 20 mg by mouth daily. )  . OVER THE COUNTER MEDICATION Take 2 tablets by mouth daily. Heal and soothe otc supplement  . Polyethylene Glycol 3350 (MIRALAX PO) Take by mouth.  . traZODone (DESYREL) 50 MG tablet TAKE 2 TABLETS BY MOUTH EVERY DAY  . venlafaxine XR (EFFEXOR-XR) 75 MG 24 hr capsule TAKE 2 CAPSULES (150 MG TOTAL) BY MOUTH DAILY WITH BREAKFAST.  Marland Kitchen Vitamin D, Ergocalciferol, (DRISDOL) 1.25 MG (50000 UNIT) CAPS capsule TAKE 1 CAPSULE (50,000 UNITS TOTAL) BY  MOUTH EVERY 7 (SEVEN) DAYS. (Patient taking differently: Take 50,000 Units by mouth every Sunday. )  . bisacodyl (DULCOLAX) 10 MG suppository Place 1 suppository (10 mg total) rectally daily as needed for moderate constipation. (Patient not taking: Reported on 10/20/2019)  . traMADol (ULTRAM) 50 MG tablet Take 1 tablet (50 mg total) by mouth every 6 (six) hours as needed for moderate pain. (Patient not taking: Reported on 10/20/2019)   No facility-administered medications prior to visit.    Review of Systems    Constitutional: Negative.   Respiratory: Negative.   Cardiovascular: Negative.   Gastrointestinal: Negative.   Endocrine: Negative.   Musculoskeletal: Negative.   Skin: Negative.        Hair loss  Neurological: Negative.   Psychiatric/Behavioral: Negative.     Last CBC Lab Results  Component Value Date   WBC 11.8 (H) 09/27/2019   HGB 13.1 09/27/2019   HCT 39.5 09/27/2019   MCV 98.8 09/27/2019   MCH 32.8 09/27/2019   RDW 13.4 09/27/2019   PLT 152 13/10/6576   Last metabolic panel Lab Results  Component Value Date   GLUCOSE 81 11/16/2019   NA 140 11/16/2019   K 4.6 11/16/2019   CL 104 11/16/2019   CO2 25 11/16/2019   BUN 15 11/16/2019   CREATININE 0.79 11/16/2019   GFRNONAA 74 11/16/2019   GFRAA 85 11/16/2019   CALCIUM 9.4 11/16/2019   PROT 6.6 04/19/2018   ALBUMIN 3.9 04/19/2018   LABGLOB 2.7 04/19/2018   AGRATIO 1.4 04/19/2018   BILITOT 0.3 04/19/2018   ALKPHOS 116 04/19/2018   AST 14 04/19/2018   ALT 15 04/19/2018   ANIONGAP 8 09/27/2019      Objective    BP (!) 147/90   Pulse 68   Temp 98.9 F (37.2 C)   Wt 244 lb (110.7 kg)   BMI 40.60 kg/m  BP Readings from Last 3 Encounters:  11/16/19 (!) 147/90  10/20/19 (!) 136/90  09/29/19 (!) 149/71   Wt Readings from Last 3 Encounters:  11/16/19 244 lb (110.7 kg)  10/20/19 (!) 240 lb (108.9 kg)  09/26/19 246 lb 4.1 oz (111.7 kg)      Physical Exam Vitals reviewed.  Constitutional:      General: She is not in acute distress.    Appearance: Normal appearance. She is well-developed. She is obese. She is not ill-appearing or diaphoretic.  Neck:     Thyroid: No thyromegaly.     Vascular: No JVD.     Trachea: No tracheal deviation.  Cardiovascular:     Rate and Rhythm: Normal rate and regular rhythm.     Heart sounds: Normal heart sounds. No murmur heard.  No friction rub. No gallop.   Pulmonary:     Effort: Pulmonary effort is normal. No respiratory distress.     Breath sounds: Normal breath  sounds. No wheezing or rales.  Musculoskeletal:     Cervical back: Normal range of motion and neck supple.  Lymphadenopathy:     Cervical: No cervical adenopathy.  Skin:    Comments: Thinning hair diffusely, no distinct spots of alopecia  Neurological:     Mental Status: She is alert.       Results for orders placed or performed in visit on 11/16/19  TSH  Result Value Ref Range   TSH 1.350 0.450 - 4.500 uIU/mL  Basic Metabolic Panel (BMET)  Result Value Ref Range   Glucose 81 65 - 99 mg/dL   BUN 15 8 - 27  mg/dL   Creatinine, Ser 0.79 0.57 - 1.00 mg/dL   GFR calc non Af Amer 74 >59 mL/min/1.73   GFR calc Af Amer 85 >59 mL/min/1.73   BUN/Creatinine Ratio 19 12 - 28   Sodium 140 134 - 144 mmol/L   Potassium 4.6 3.5 - 5.2 mmol/L   Chloride 104 96 - 106 mmol/L   CO2 25 20 - 29 mmol/L   Calcium 9.4 8.7 - 10.3 mg/dL  Fe+TIBC+Fer  Result Value Ref Range   Total Iron Binding Capacity 320 250 - 450 ug/dL   UIBC 231 118 - 369 ug/dL   Iron 89 27 - 139 ug/dL   Iron Saturation 28 15 - 55 %   Ferritin 49 15.0 - 150.0 ng/mL    Assessment & Plan     1. Hair loss Will check labs as below and f/u pending results. Discussed this could be a natural stress response from recent surgery and anesthesia. Discussed this can happen and if the body starts shedding it can last for up to 6 months. Advised can try Minoxidil over the counter and biotin hair and nail supplement.  - TSH - Basic Metabolic Panel (BMET) - Fe+TIBC+Fer   Return if symptoms worsen or fail to improve.      Reynolds Bowl, PA-C, have reviewed all documentation for this visit. The documentation on 11/17/19 for the exam, diagnosis, procedures, and orders are all accurate and complete.   Rubye Beach  [facility] [phone] (phone) [phone] (fax)  [place]

## 2019-11-16 NOTE — Patient Instructions (Signed)
Alopecia Areata, Adult  Alopecia areata is a condition that causes you to lose hair. You may lose hair on your scalp in patches. In some cases, you may lose all the hair on your scalp (alopecia totalis) or all the hair from your face and body (alopecia universalis). Alopecia areata is an autoimmune disease. This means that your body's defense system (immune system) mistakes normal parts of the body for germs or other things that can make you sick. When you have alopecia areata, the immune system attacks the hair follicles. Alopecia areata usually develops in childhood, but it can develop at any age. For some people, their hair grows back on its own and hair loss does not happen again. For others, their hair may fall out and grow back in cycles. The hair loss may last many years. Having this condition can be emotionally difficult, but it is not dangerous. What are the causes? The cause of this condition is not known. What increases the risk? This condition is more likely to develop in people who have:  A family history of alopecia.  A family history of another autoimmune disease, including type 1 diabetes and rheumatoid arthritis.  Asthma and allergies.  Down syndrome. What are the signs or symptoms? Round spots of patchy hair loss on the scalp is the main symptom of this condition. The spots may be mildly itchy. Other symptoms include:  Short dark hairs in the bald patches that are wider at the top (exclamation point hairs).  Dents, white spots, or lines in the fingernails or toenails.  Balding and body hair loss. This is rare. How is this diagnosed? This condition is diagnosed based on your symptoms and family history. Your health care provider will also check your scalp skin, teeth, and nails. Your health care provider may refer you to a specialist in hair and skin disorders (dermatologist). You may also have tests, including:  A hair pull test.  Blood tests or other screening tests  to check for autoimmune diseases, such as thyroid disease or diabetes.  Skin biopsy to confirm the diagnosis.  A procedure to examine the skin with a lighted magnifying instrument (dermoscopy). How is this treated? There is no cure for alopecia areata. Treatment is aimed at promoting the regrowth of hair and preventing the immune system from overreacting. No single treatment is right for all people with alopecia areata. It depends on the type of hair loss you have and how severe it is. Work with your health care provider to find the best treatment for you. Treatment may include:  Having regular checkups to make sure the condition is not getting worse (watchful waiting).  Steroid creams or pills for 6-8 weeks to stop the immune reaction and help hair to regrow more quickly.  Other topical medicines to alter the immune system response and support the hair growth cycle.  Steroid injections.  Therapy and counseling with a support group or therapist if you are having trouble coping with hair loss. Follow these instructions at home:  Learn as much as you can about your condition.  Apply topical creams only as told by your health care provider.  Take over-the-counter and prescription medicines only as told by your health care provider.  Consider getting a wig or products to make hair look fuller or to cover bald spots, if you feel uncomfortable with your appearance.  Get therapy or counseling if you are having a hard time coping with hair loss. Ask your health care provider to recommend  a counselor or support group.  Keep all follow-up visits as told by your health care provider. This is important. Contact a health care provider if:  Your hair loss gets worse, even with treatment.  You have new symptoms.  You are struggling emotionally. Summary  Alopecia areata is an autoimmune condition that makes your body's defense system (immune system) attack the hair follicles. This causes you  to lose hair.  Treatments may include regular checkups to make sure that the condition is not getting worse (watchful waiting), medicines, and steroid injections. This information is not intended to replace advice given to you by your health care provider. Make sure you discuss any questions you have with your health care provider. Document Revised: 02/19/2017 Document Reviewed: 03/27/2016 Elsevier Patient Education  2020 [employer]  

## 2019-11-17 ENCOUNTER — Telehealth: Payer: Self-pay

## 2019-11-17 LAB — IRON,TIBC AND FERRITIN PANEL
Ferritin: 49 ng/mL (ref 15–150)
Iron Saturation: 28 % (ref 15–55)
Iron: 89 ug/dL (ref 27–139)
Total Iron Binding Capacity: 320 ug/dL (ref 250–450)
UIBC: 231 ug/dL (ref 118–369)

## 2019-11-17 LAB — TSH: TSH: 1.35 u[IU]/mL (ref 0.450–4.500)

## 2019-11-17 LAB — BASIC METABOLIC PANEL
BUN/Creatinine Ratio: 19 (ref 12–28)
BUN: 15 mg/dL (ref 8–27)
CO2: 25 mmol/L (ref 20–29)
Calcium: 9.4 mg/dL (ref 8.7–10.3)
Chloride: 104 mmol/L (ref 96–106)
Creatinine, Ser: 0.79 mg/dL (ref 0.57–1.00)
GFR calc Af Amer: 85 mL/min/{1.73_m2} (ref >59)
GFR calc non Af Amer: 74 mL/min/{1.73_m2} (ref >59)
Glucose: 81 mg/dL (ref 65–99)
Potassium: 4.6 mmol/L (ref 3.5–5.2)
Sodium: 140 mmol/L (ref 134–144)

## 2019-11-17 NOTE — Telephone Encounter (Signed)
LMTCB-if patient calls back ok for [facility] nurse to give resutls

## 2019-11-17 NOTE — Telephone Encounter (Signed)
Noted patient viewed via mychart Seen by patient Robin Espinoza on 11/17/2019  9:36 AM

## 2019-11-17 NOTE — Telephone Encounter (Signed)
-----   Message from Mar Daring, [place] sent at 11/17/2019  9:16 AM EDT ----- All labs are normal. No source for hair loss. Suspect secondary to stress from surgery and anesthesia as discussed. Can use Rogaine (minoxidil) over the counter and  biotin for hair and nails supplement.

## 2019-11-23 ENCOUNTER — Other Ambulatory Visit: Payer: Self-pay | Admitting: Physician Assistant

## 2019-11-23 DIAGNOSIS — M1991 Primary osteoarthritis, unspecified site: Secondary | ICD-10-CM

## 2019-11-28 DIAGNOSIS — Z96651 Presence of right artificial knee joint: Secondary | ICD-10-CM | POA: Diagnosis not present

## 2019-11-28 DIAGNOSIS — I10 Essential (primary) hypertension: Secondary | ICD-10-CM | POA: Diagnosis not present

## 2019-11-28 DIAGNOSIS — G4733 Obstructive sleep apnea (adult) (pediatric): Secondary | ICD-10-CM | POA: Diagnosis not present

## 2019-11-28 DIAGNOSIS — R2681 Unsteadiness on feet: Secondary | ICD-10-CM | POA: Diagnosis not present

## 2019-11-28 DIAGNOSIS — F419 Anxiety disorder, unspecified: Secondary | ICD-10-CM | POA: Diagnosis not present

## 2019-11-28 DIAGNOSIS — F329 Major depressive disorder, single episode, unspecified: Secondary | ICD-10-CM | POA: Diagnosis not present

## 2019-11-28 DIAGNOSIS — Z471 Aftercare following joint replacement surgery: Secondary | ICD-10-CM | POA: Diagnosis not present

## 2019-11-29 ENCOUNTER — Telehealth: Payer: Self-pay

## 2019-11-29 DIAGNOSIS — G2581 Restless legs syndrome: Secondary | ICD-10-CM

## 2019-11-29 MED ORDER — ROPINIROLE HCL 1 MG PO TABS: 1.0000 mg | ORAL_TABLET | Freq: Every day | ORAL | 1 refills | Status: DC

## 2019-11-29 NOTE — Telephone Encounter (Signed)
Copied from [place] [id number]. Topic: General - Inquiry >> Nov 29, 2019 10:30 AM Mathis Bud wrote: Reason for CRM: Patient called wanting to let PCP know that patient is having restless legs at night.  It is effecting her sleep.  Patient is requesting a call back from PCP today if possible.  Call back [phone]

## 2019-11-29 NOTE — Telephone Encounter (Signed)
Requip sent for RLS.

## 2019-12-12 ENCOUNTER — Other Ambulatory Visit: Payer: Self-pay | Admitting: Physician Assistant

## 2019-12-12 DIAGNOSIS — M1991 Primary osteoarthritis, unspecified site: Secondary | ICD-10-CM

## 2019-12-22 ENCOUNTER — Other Ambulatory Visit: Payer: PPO

## 2019-12-24 ENCOUNTER — Other Ambulatory Visit: Payer: Self-pay | Admitting: Physician Assistant

## 2019-12-24 DIAGNOSIS — M1991 Primary osteoarthritis, unspecified site: Secondary | ICD-10-CM

## 2019-12-24 DIAGNOSIS — G2581 Restless legs syndrome: Secondary | ICD-10-CM

## 2019-12-25 ENCOUNTER — Other Ambulatory Visit: Payer: Self-pay | Admitting: Physician Assistant

## 2019-12-25 MED ORDER — ETODOLAC 500 MG PO TABS
500.0000 mg | ORAL_TABLET | Freq: Two times a day (BID) | ORAL | 3 refills | Status: DC
Start: 2019-12-25 — End: 2020-05-16

## 2019-12-25 NOTE — Telephone Encounter (Signed)
Etodolac refilled

## 2019-12-25 NOTE — Addendum Note (Signed)
Addended by: Mar Daring on: 12/25/2019 05:44 PM   Modules accepted: Orders

## 2019-12-25 NOTE — Telephone Encounter (Signed)
Med dc'd 09/29/19 Pt requesting RF- Routing to [facility].

## 2019-12-25 NOTE — Telephone Encounter (Signed)
Medication: etodolac (LODINE) 500 MG tablet [[id number]]  DISCONTINUED  Has the patient contacted their pharmacy? YES  (Agent: If no, request that the patient contact the pharmacy for the refill.) (Agent: If yes, when and what did the pharmacy advise?)  Preferred Pharmacy (with phone number or street name): [id number] - , [place] - [address] [place] [address] Phone: [phone] Fax: [phone] Hours: Not open 24 hours    Agent: Please be advised that RX refills may take up to 3 business days. We ask that you follow-up with your pharmacy.

## 2019-12-27 ENCOUNTER — Other Ambulatory Visit: Payer: Self-pay

## 2019-12-27 ENCOUNTER — Ambulatory Visit (INDEPENDENT_AMBULATORY_CARE_PROVIDER_SITE_OTHER): Payer: PPO | Admitting: Physician Assistant

## 2019-12-27 ENCOUNTER — Encounter: Payer: Self-pay | Admitting: Physician Assistant

## 2019-12-27 VITALS — BP 159/92 | HR 67 | Temp 99.2°F | Resp 16

## 2019-12-27 DIAGNOSIS — H6123 Impacted cerumen, bilateral: Secondary | ICD-10-CM

## 2019-12-27 DIAGNOSIS — Z23 Encounter for immunization: Secondary | ICD-10-CM

## 2019-12-27 NOTE — Progress Notes (Signed)
Established patient visit   Patient: Robin Espinoza   DOB: May 17, 1945   74 y.o. Female  MRN: [id number] Visit Date: 12/27/2019  Today's healthcare provider: Mar Daring, PA-C   Chief Complaint  Patient presents with  . Ear Fullness   Subjective    HPI  Patient here with c/o ear fullness. Reports that they feel clogged. Having some tinnitus that comes and goes. Some hearing loss.   Patient Active Problem List   Diagnosis Date Noted  . S/P TKR (total knee replacement) using cement, 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  . BMI 40.0-44.9, adult ([place]) 04/15/2018  . Avitaminosis D 02/28/2015  . Hypothyroidism 11/13/2014  . Anxiety disorder 10/15/2014  . Insomnia 09/21/2014  . Acid reflux 11/30/2007  . Arthritis, degenerative 09/16/2007  . Fam hx-ischem heart disease 02/12/2007  . Adiposity 02/09/2007  . Obstructive apnea 05/06/2006  . 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 BP (high blood pressure) 03/23/1998   Past Medical History:  Diagnosis Date  . Anxiety   . Depression   . Family history of adverse reaction to anesthesia    sister had a reaction when had a baby  . GERD (gastroesophageal reflux disease)   . Hyperlipidemia 12/21/2005  . Hypertension   . Hypothyroidism   . Insomnia   . OAB (overactive bladder)   . Obstructive sleep apnea   . Osteoarthrosis   . Vitamin D deficiency        Medications: Outpatient Medications Prior to Visit  Medication Sig  . acetaminophen (TYLENOL) 650 MG CR tablet Take 1,300 mg by mouth every 8 (eight) hours as needed for pain.  . Calcium Polycarbophil (FIBER-CAPS PO) Take 2 capsules by mouth daily.  . Carboxymethylcellulose Sodium (THERATEARS OP) Place 1 drop into both eyes daily as needed (dry eyes).  . clotrimazole-betamethasone (LOTRISONE) cream Apply 1 application topically 2 (two) times daily. (Patient taking differently: Apply 1 application topically 2 (two) times daily as needed (rash). )  . docusate  sodium (COLACE) 100 MG capsule Take 1 capsule (100 mg total) by mouth 2 (two) times daily.  Marland Kitchen etodolac (LODINE) 500 MG tablet Take 1 tablet (500 mg total) by mouth 2 (two) times daily.  Marland Kitchen levothyroxine (SYNTHROID) 112 MCG tablet TAKE 1 TABLET (112 MCG TOTAL) BY MOUTH DAILY BEFORE BREAKFAST.  Marland Kitchen lovastatin (MEVACOR) 40 MG tablet TAKE 1 TABLET BY MOUTH EVERYDAY AT BEDTIME (Patient taking differently: Take 40 mg by mouth at bedtime. )  . metoprolol succinate (TOPROL-XL) 100 MG 24 hr tablet TAKE 1 TABLET (100 MG TOTAL) BY MOUTH DAILY. TAKE WITH OR IMMEDIATELY FOLLOWING A MEAL.  . NON FORMULARY CPAP  . omeprazole (PRILOSEC) 20 MG capsule TAKE 1 CAPSULE BY MOUTH EVERY DAY (Patient taking differently: Take 20 mg by mouth daily. )  . Polyethylene Glycol 3350 (MIRALAX PO) Take by mouth.  Marland Kitchen rOPINIRole (REQUIP) 1 MG tablet Take 1 tablet (1 mg total) by mouth at bedtime.  . traZODone (DESYREL) 50 MG tablet TAKE 2 TABLETS BY MOUTH EVERY DAY  . venlafaxine XR (EFFEXOR-XR) 75 MG 24 hr capsule TAKE 2 CAPSULES (150 MG TOTAL) BY MOUTH DAILY WITH BREAKFAST.  Marland Kitchen Vitamin D, Ergocalciferol, (DRISDOL) 1.25 MG (50000 UNIT) CAPS capsule TAKE 1 CAPSULE (50,000 UNITS TOTAL) BY MOUTH EVERY 7 (SEVEN) DAYS. (Patient taking differently: Take 50,000 Units by mouth every Sunday. )  . OVER THE COUNTER MEDICATION Take 2 tablets by mouth daily. Heal and soothe otc supplement   No facility-administered medications prior to visit.  Review of Systems  Constitutional: Negative.   HENT: Positive for ear pain (fullness), hearing loss and tinnitus. Negative for congestion.   Respiratory: Negative.   Cardiovascular: Negative.   Neurological: Negative for dizziness.    Last CBC Lab Results  Component Value Date   WBC 11.8 (H) 09/27/2019   HGB 13.1 09/27/2019   HCT 39.5 09/27/2019   MCV 98.8 09/27/2019   MCH 32.8 09/27/2019   RDW 13.4 09/27/2019   PLT 152 83/41/9622   Last metabolic panel Lab Results  Component Value Date     GLUCOSE 81 11/16/2019   NA 140 11/16/2019   K 4.6 11/16/2019   CL 104 11/16/2019   CO2 25 11/16/2019   BUN 15 11/16/2019   CREATININE 0.79 11/16/2019   GFRNONAA 74 11/16/2019   GFRAA 85 11/16/2019   CALCIUM 9.4 11/16/2019   PROT 6.6 04/19/2018   ALBUMIN 3.9 04/19/2018   LABGLOB 2.7 04/19/2018   AGRATIO 1.4 04/19/2018   BILITOT 0.3 04/19/2018   ALKPHOS 116 04/19/2018   AST 14 04/19/2018   ALT 15 04/19/2018   ANIONGAP 8 09/27/2019      Objective    BP (!) 159/92 (BP Location: Left Arm, Patient Position: Sitting, Cuff Size: Large)   Pulse 67   Temp 99.2 F (37.3 C) (Oral)   Resp 16  BP Readings from Last 3 Encounters:  12/27/19 (!) 159/92  11/16/19 (!) 147/90  10/20/19 (!) 136/90   Wt Readings from Last 3 Encounters:  11/16/19 244 lb (110.7 kg)  10/20/19 (!) 240 lb (108.9 kg)  09/26/19 246 lb 4.1 oz (111.7 kg)      Physical Exam Vitals reviewed.  Constitutional:      General: She is not in acute distress.    Appearance: Normal appearance. She is well-developed. She is obese. She is not ill-appearing or diaphoretic.  HENT:     Head: Normocephalic and atraumatic.     Right Ear: Hearing, tympanic membrane, ear canal and external ear normal. There is impacted cerumen.     Left Ear: Hearing, tympanic membrane, ear canal and external ear normal. There is impacted cerumen.     Nose: Nose normal.     Mouth/Throat:     Pharynx: Uvula midline. No oropharyngeal exudate.  Eyes:     General: No scleral icterus.       Right eye: No discharge.        Left eye: No discharge.     Conjunctiva/sclera: Conjunctivae normal.     Pupils: Pupils are equal, round, and reactive to light.  Neck:     Thyroid: No thyromegaly.     Trachea: No tracheal deviation.  Cardiovascular:     Rate and Rhythm: Normal rate and regular rhythm.     Heart sounds: Normal heart sounds. No murmur heard.  No friction rub. No gallop.   Pulmonary:     Effort: Pulmonary effort is normal. No respiratory  distress.     Breath sounds: Normal breath sounds. No stridor. No wheezing or rales.  Musculoskeletal:     Cervical back: Normal range of motion and neck supple.  Lymphadenopathy:     Cervical: No cervical adenopathy.  Skin:    General: Skin is warm and dry.  Neurological:     Mental Status: She is alert.       No results found for any visits on 12/27/19.  Assessment & Plan     1. Bilateral impacted cerumen Lavage successful bilaterally.  - Ear Lavage  2.  Need for influenza vaccination Flu vaccine given today without complication. Patient sat upright for 15 minutes to check for adverse reaction before being released. - Flu Vaccine QUAD High Dose(Fluad)   No follow-ups on file.      Reynolds Bowl, PA-C, have reviewed all documentation for this visit. The documentation on 12/31/19 for the exam, diagnosis, procedures, and orders are all accurate and complete.   [place]  [facility] [phone] (phone) [phone] (fax)  [place]

## 2019-12-27 NOTE — Patient Instructions (Signed)
Earwax Buildup, Adult The ears produce a substance called earwax that helps keep bacteria out of the ear and protects the skin in the ear canal. Occasionally, earwax can build up in the ear and cause discomfort or hearing loss. What increases the risk? This condition is more likely to develop in people who:  Are female.  Are elderly.  Naturally produce more earwax.  Clean their ears often with cotton swabs.  Use earplugs often.  Use in-ear headphones often.  Wear hearing aids.  Have narrow ear canals.  Have earwax that is overly thick or sticky.  Have eczema.  Are dehydrated.  Have excess hair in the ear canal. What are the signs or symptoms? Symptoms of this condition include:  Reduced or muffled hearing.  A feeling of fullness in the ear or feeling that the ear is plugged.  Fluid coming from the ear.  Ear pain.  Ear itch.  Ringing in the ear.  Coughing.  An obvious piece of earwax that can be seen inside the ear canal. How is this diagnosed? This condition may be diagnosed based on:  Your symptoms.  Your medical history.  An ear exam. During the exam, your health care provider will look into your ear with an instrument called an otoscope. You may have tests, including a hearing test. How is this treated? This condition may be treated by:  Using ear drops to soften the earwax.  Having the earwax removed by a health care provider. The health care provider may: ? Flush the ear with water. ? Use an instrument that has a loop on the end (curette). ? Use a suction device.  Surgery to remove the wax buildup. This may be done in severe cases. Follow these instructions at home:   Take over-the-counter and prescription medicines only as told by your health care provider.  Do not put any objects, including cotton swabs, into your ear. You can clean the opening of your ear canal with a washcloth or facial tissue.  Follow instructions from your health care  provider about cleaning your ears. Do not over-clean your ears.  Drink enough fluid to keep your urine clear or pale yellow. This will help to thin the earwax.  Keep all follow-up visits as told by your health care provider. If earwax builds up in your ears often or if you use hearing aids, consider seeing your health care provider for routine, preventive ear cleanings. Ask your health care provider how often you should schedule your cleanings.  If you have hearing aids, clean them according to instructions from the manufacturer and your health care provider. Contact a health care provider if:  You have ear pain.  You develop a fever.  You have blood, pus, or other fluid coming from your ear.  You have hearing loss.  You have ringing in your ears that does not go away.  Your symptoms do not improve with treatment.  You feel like the room is spinning (vertigo). Summary  Earwax can build up in the ear and cause discomfort or hearing loss.  The most common symptoms of this condition include reduced or muffled hearing and a feeling of fullness in the ear or feeling that the ear is plugged.  This condition may be diagnosed based on your symptoms, your medical history, and an ear exam.  This condition may be treated by using ear drops to soften the earwax or by having the earwax removed by a health care provider.  Do not put any   objects, including cotton swabs, into your ear. You can clean the opening of your ear canal with a washcloth or facial tissue. This information is not intended to replace advice given to you by your health care provider. Make sure you discuss any questions you have with your health care provider. Document Revised: 02/19/2017 Document Reviewed: 05/20/2016 Elsevier Patient Education  2020 [employer]  

## 2019-12-29 ENCOUNTER — Other Ambulatory Visit: Admission: RE | Admit: 2019-12-29 | Payer: PPO | Source: Ambulatory Visit

## 2020-01-01 DIAGNOSIS — G8918 Other acute postprocedural pain: Secondary | ICD-10-CM | POA: Diagnosis not present

## 2020-01-01 DIAGNOSIS — Z6841 Body Mass Index (BMI) 40.0 and over, adult: Secondary | ICD-10-CM | POA: Diagnosis not present

## 2020-01-01 DIAGNOSIS — F411 Generalized anxiety disorder: Secondary | ICD-10-CM | POA: Diagnosis not present

## 2020-01-01 DIAGNOSIS — F339 Major depressive disorder, recurrent, unspecified: Secondary | ICD-10-CM | POA: Diagnosis not present

## 2020-01-01 DIAGNOSIS — E039 Hypothyroidism, unspecified: Secondary | ICD-10-CM | POA: Diagnosis not present

## 2020-01-01 DIAGNOSIS — M1712 Unilateral primary osteoarthritis, left knee: Secondary | ICD-10-CM | POA: Diagnosis not present

## 2020-01-01 DIAGNOSIS — I10 Essential (primary) hypertension: Secondary | ICD-10-CM | POA: Diagnosis not present

## 2020-01-01 DIAGNOSIS — F419 Anxiety disorder, unspecified: Secondary | ICD-10-CM | POA: Diagnosis not present

## 2020-01-01 DIAGNOSIS — M109 Gout, unspecified: Secondary | ICD-10-CM | POA: Diagnosis not present

## 2020-01-01 DIAGNOSIS — M25562 Pain in left knee: Secondary | ICD-10-CM | POA: Diagnosis not present

## 2020-01-01 DIAGNOSIS — K219 Gastro-esophageal reflux disease without esophagitis: Secondary | ICD-10-CM | POA: Diagnosis not present

## 2020-01-01 DIAGNOSIS — E785 Hyperlipidemia, unspecified: Secondary | ICD-10-CM | POA: Diagnosis not present

## 2020-01-01 DIAGNOSIS — R509 Fever, unspecified: Secondary | ICD-10-CM | POA: Diagnosis not present

## 2020-01-01 DIAGNOSIS — E669 Obesity, unspecified: Secondary | ICD-10-CM | POA: Diagnosis not present

## 2020-01-01 DIAGNOSIS — E7849 Other hyperlipidemia: Secondary | ICD-10-CM | POA: Diagnosis not present

## 2020-01-01 DIAGNOSIS — F32A Depression, unspecified: Secondary | ICD-10-CM | POA: Diagnosis not present

## 2020-01-01 DIAGNOSIS — G4733 Obstructive sleep apnea (adult) (pediatric): Secondary | ICD-10-CM | POA: Diagnosis not present

## 2020-01-01 DIAGNOSIS — G2581 Restless legs syndrome: Secondary | ICD-10-CM | POA: Diagnosis not present

## 2020-01-01 DIAGNOSIS — Z96651 Presence of right artificial knee joint: Secondary | ICD-10-CM | POA: Diagnosis not present

## 2020-01-01 DIAGNOSIS — Z79890 Hormone replacement therapy (postmenopausal): Secondary | ICD-10-CM | POA: Diagnosis not present

## 2020-01-01 DIAGNOSIS — Z471 Aftercare following joint replacement surgery: Secondary | ICD-10-CM | POA: Diagnosis not present

## 2020-01-01 DIAGNOSIS — E7800 Pure hypercholesterolemia, unspecified: Secondary | ICD-10-CM | POA: Diagnosis not present

## 2020-01-01 DIAGNOSIS — N3281 Overactive bladder: Secondary | ICD-10-CM | POA: Diagnosis not present

## 2020-01-01 DIAGNOSIS — R410 Disorientation, unspecified: Secondary | ICD-10-CM | POA: Diagnosis not present

## 2020-01-01 DIAGNOSIS — E559 Vitamin D deficiency, unspecified: Secondary | ICD-10-CM | POA: Diagnosis not present

## 2020-01-02 ENCOUNTER — Inpatient Hospital Stay: Admit: 2020-01-02 | Payer: PPO | Admitting: Orthopedic Surgery

## 2020-01-02 SURGERY — ARTHROPLASTY, KNEE, TOTAL
Anesthesia: Choice | Site: Knee | Laterality: Left

## 2020-01-05 DIAGNOSIS — N3281 Overactive bladder: Secondary | ICD-10-CM | POA: Diagnosis not present

## 2020-01-05 DIAGNOSIS — R091 Pleurisy: Secondary | ICD-10-CM | POA: Diagnosis not present

## 2020-01-05 DIAGNOSIS — I10 Essential (primary) hypertension: Secondary | ICD-10-CM | POA: Diagnosis not present

## 2020-01-05 DIAGNOSIS — E559 Vitamin D deficiency, unspecified: Secondary | ICD-10-CM | POA: Diagnosis not present

## 2020-01-05 DIAGNOSIS — M1712 Unilateral primary osteoarthritis, left knee: Secondary | ICD-10-CM | POA: Diagnosis not present

## 2020-01-05 DIAGNOSIS — E039 Hypothyroidism, unspecified: Secondary | ICD-10-CM | POA: Diagnosis not present

## 2020-01-05 DIAGNOSIS — F339 Major depressive disorder, recurrent, unspecified: Secondary | ICD-10-CM | POA: Diagnosis not present

## 2020-01-05 DIAGNOSIS — F411 Generalized anxiety disorder: Secondary | ICD-10-CM | POA: Diagnosis not present

## 2020-01-05 DIAGNOSIS — K219 Gastro-esophageal reflux disease without esophagitis: Secondary | ICD-10-CM | POA: Diagnosis not present

## 2020-01-05 DIAGNOSIS — G4733 Obstructive sleep apnea (adult) (pediatric): Secondary | ICD-10-CM | POA: Diagnosis not present

## 2020-01-05 DIAGNOSIS — F39 Unspecified mood [affective] disorder: Secondary | ICD-10-CM | POA: Diagnosis not present

## 2020-01-05 DIAGNOSIS — E7849 Other hyperlipidemia: Secondary | ICD-10-CM | POA: Diagnosis not present

## 2020-01-05 DIAGNOSIS — Z471 Aftercare following joint replacement surgery: Secondary | ICD-10-CM | POA: Diagnosis not present

## 2020-01-05 DIAGNOSIS — Z96651 Presence of right artificial knee joint: Secondary | ICD-10-CM | POA: Diagnosis not present

## 2020-01-05 DIAGNOSIS — Z96652 Presence of left artificial knee joint: Secondary | ICD-10-CM | POA: Diagnosis not present

## 2020-01-05 DIAGNOSIS — M109 Gout, unspecified: Secondary | ICD-10-CM | POA: Diagnosis not present

## 2020-01-05 DIAGNOSIS — G2581 Restless legs syndrome: Secondary | ICD-10-CM | POA: Diagnosis not present

## 2020-01-09 DIAGNOSIS — M1712 Unilateral primary osteoarthritis, left knee: Secondary | ICD-10-CM | POA: Diagnosis not present

## 2020-01-09 DIAGNOSIS — F39 Unspecified mood [affective] disorder: Secondary | ICD-10-CM | POA: Diagnosis not present

## 2020-01-09 DIAGNOSIS — I10 Essential (primary) hypertension: Secondary | ICD-10-CM | POA: Diagnosis not present

## 2020-01-09 DIAGNOSIS — K219 Gastro-esophageal reflux disease without esophagitis: Secondary | ICD-10-CM | POA: Diagnosis not present

## 2020-01-09 DIAGNOSIS — G2581 Restless legs syndrome: Secondary | ICD-10-CM

## 2020-01-09 DIAGNOSIS — Z96652 Presence of left artificial knee joint: Secondary | ICD-10-CM | POA: Diagnosis not present

## 2020-01-15 DIAGNOSIS — M1712 Unilateral primary osteoarthritis, left knee: Secondary | ICD-10-CM | POA: Diagnosis not present

## 2020-01-15 DIAGNOSIS — R091 Pleurisy: Secondary | ICD-10-CM | POA: Diagnosis not present

## 2020-01-15 DIAGNOSIS — Z96651 Presence of right artificial knee joint: Secondary | ICD-10-CM | POA: Diagnosis not present

## 2020-01-19 DIAGNOSIS — G4700 Insomnia, unspecified: Secondary | ICD-10-CM | POA: Diagnosis not present

## 2020-01-19 DIAGNOSIS — E785 Hyperlipidemia, unspecified: Secondary | ICD-10-CM | POA: Diagnosis not present

## 2020-01-19 DIAGNOSIS — F411 Generalized anxiety disorder: Secondary | ICD-10-CM | POA: Diagnosis not present

## 2020-01-19 DIAGNOSIS — G4733 Obstructive sleep apnea (adult) (pediatric): Secondary | ICD-10-CM | POA: Diagnosis not present

## 2020-01-19 DIAGNOSIS — E039 Hypothyroidism, unspecified: Secondary | ICD-10-CM | POA: Diagnosis not present

## 2020-01-19 DIAGNOSIS — G2581 Restless legs syndrome: Secondary | ICD-10-CM | POA: Diagnosis not present

## 2020-01-19 DIAGNOSIS — N3281 Overactive bladder: Secondary | ICD-10-CM | POA: Diagnosis not present

## 2020-01-19 DIAGNOSIS — M109 Gout, unspecified: Secondary | ICD-10-CM | POA: Diagnosis not present

## 2020-01-19 DIAGNOSIS — Z96653 Presence of artificial knee joint, bilateral: Secondary | ICD-10-CM | POA: Diagnosis not present

## 2020-01-19 DIAGNOSIS — E559 Vitamin D deficiency, unspecified: Secondary | ICD-10-CM | POA: Diagnosis not present

## 2020-01-19 DIAGNOSIS — K219 Gastro-esophageal reflux disease without esophagitis: Secondary | ICD-10-CM | POA: Diagnosis not present

## 2020-01-19 DIAGNOSIS — F339 Major depressive disorder, recurrent, unspecified: Secondary | ICD-10-CM | POA: Diagnosis not present

## 2020-01-19 DIAGNOSIS — I10 Essential (primary) hypertension: Secondary | ICD-10-CM | POA: Diagnosis not present

## 2020-01-19 DIAGNOSIS — Z9181 History of falling: Secondary | ICD-10-CM | POA: Diagnosis not present

## 2020-01-19 DIAGNOSIS — Z471 Aftercare following joint replacement surgery: Secondary | ICD-10-CM | POA: Diagnosis not present

## 2020-01-22 ENCOUNTER — Ambulatory Visit: Payer: Self-pay | Admitting: Physician Assistant

## 2020-01-22 DIAGNOSIS — F339 Major depressive disorder, recurrent, unspecified: Secondary | ICD-10-CM | POA: Diagnosis not present

## 2020-01-22 DIAGNOSIS — E039 Hypothyroidism, unspecified: Secondary | ICD-10-CM | POA: Diagnosis not present

## 2020-01-22 DIAGNOSIS — G2581 Restless legs syndrome: Secondary | ICD-10-CM | POA: Diagnosis not present

## 2020-01-22 DIAGNOSIS — Z96653 Presence of artificial knee joint, bilateral: Secondary | ICD-10-CM | POA: Diagnosis not present

## 2020-01-22 DIAGNOSIS — G4733 Obstructive sleep apnea (adult) (pediatric): Secondary | ICD-10-CM | POA: Diagnosis not present

## 2020-01-22 DIAGNOSIS — Z471 Aftercare following joint replacement surgery: Secondary | ICD-10-CM | POA: Diagnosis not present

## 2020-01-22 DIAGNOSIS — E785 Hyperlipidemia, unspecified: Secondary | ICD-10-CM | POA: Diagnosis not present

## 2020-01-22 DIAGNOSIS — I10 Essential (primary) hypertension: Secondary | ICD-10-CM | POA: Diagnosis not present

## 2020-01-22 DIAGNOSIS — F411 Generalized anxiety disorder: Secondary | ICD-10-CM | POA: Diagnosis not present

## 2020-01-22 DIAGNOSIS — M109 Gout, unspecified: Secondary | ICD-10-CM | POA: Diagnosis not present

## 2020-01-22 DIAGNOSIS — G4700 Insomnia, unspecified: Secondary | ICD-10-CM | POA: Diagnosis not present

## 2020-01-22 DIAGNOSIS — K219 Gastro-esophageal reflux disease without esophagitis: Secondary | ICD-10-CM | POA: Diagnosis not present

## 2020-01-22 DIAGNOSIS — N3281 Overactive bladder: Secondary | ICD-10-CM | POA: Diagnosis not present

## 2020-01-22 DIAGNOSIS — E559 Vitamin D deficiency, unspecified: Secondary | ICD-10-CM | POA: Diagnosis not present

## 2020-01-22 DIAGNOSIS — Z9181 History of falling: Secondary | ICD-10-CM | POA: Diagnosis not present

## 2020-01-30 DIAGNOSIS — Z96653 Presence of artificial knee joint, bilateral: Secondary | ICD-10-CM | POA: Diagnosis not present

## 2020-01-30 DIAGNOSIS — E559 Vitamin D deficiency, unspecified: Secondary | ICD-10-CM | POA: Diagnosis not present

## 2020-01-30 DIAGNOSIS — G2581 Restless legs syndrome: Secondary | ICD-10-CM | POA: Diagnosis not present

## 2020-01-30 DIAGNOSIS — E785 Hyperlipidemia, unspecified: Secondary | ICD-10-CM | POA: Diagnosis not present

## 2020-01-30 DIAGNOSIS — Z471 Aftercare following joint replacement surgery: Secondary | ICD-10-CM | POA: Diagnosis not present

## 2020-01-30 DIAGNOSIS — N3281 Overactive bladder: Secondary | ICD-10-CM | POA: Diagnosis not present

## 2020-01-30 DIAGNOSIS — G4700 Insomnia, unspecified: Secondary | ICD-10-CM | POA: Diagnosis not present

## 2020-01-30 DIAGNOSIS — Z9181 History of falling: Secondary | ICD-10-CM | POA: Diagnosis not present

## 2020-01-30 DIAGNOSIS — M109 Gout, unspecified: Secondary | ICD-10-CM | POA: Diagnosis not present

## 2020-01-30 DIAGNOSIS — I10 Essential (primary) hypertension: Secondary | ICD-10-CM | POA: Diagnosis not present

## 2020-01-30 DIAGNOSIS — K219 Gastro-esophageal reflux disease without esophagitis: Secondary | ICD-10-CM | POA: Diagnosis not present

## 2020-01-30 DIAGNOSIS — E039 Hypothyroidism, unspecified: Secondary | ICD-10-CM | POA: Diagnosis not present

## 2020-01-30 DIAGNOSIS — G4733 Obstructive sleep apnea (adult) (pediatric): Secondary | ICD-10-CM | POA: Diagnosis not present

## 2020-01-30 DIAGNOSIS — F339 Major depressive disorder, recurrent, unspecified: Secondary | ICD-10-CM | POA: Diagnosis not present

## 2020-01-30 DIAGNOSIS — F411 Generalized anxiety disorder: Secondary | ICD-10-CM | POA: Diagnosis not present

## 2020-02-19 DIAGNOSIS — M1712 Unilateral primary osteoarthritis, left knee: Secondary | ICD-10-CM | POA: Diagnosis not present

## 2020-02-29 ENCOUNTER — Other Ambulatory Visit: Payer: Self-pay | Admitting: Physician Assistant

## 2020-02-29 DIAGNOSIS — G2581 Restless legs syndrome: Secondary | ICD-10-CM

## 2020-03-09 ENCOUNTER — Other Ambulatory Visit: Payer: Self-pay | Admitting: Physician Assistant

## 2020-03-09 DIAGNOSIS — E559 Vitamin D deficiency, unspecified: Secondary | ICD-10-CM

## 2020-03-09 NOTE — Telephone Encounter (Signed)
Requested medications are due for refill today yes  Requested medications are on the active medication list yes  Last refill 9/22  Last visit 03/2019  Future visit scheduled yes, 03/2020  Notes to clinic Not Delegated.

## 2020-03-28 DIAGNOSIS — R2689 Other abnormalities of gait and mobility: Secondary | ICD-10-CM | POA: Diagnosis not present

## 2020-03-28 DIAGNOSIS — M6281 Muscle weakness (generalized): Secondary | ICD-10-CM | POA: Diagnosis not present

## 2020-03-29 ENCOUNTER — Other Ambulatory Visit: Payer: Self-pay | Admitting: Physician Assistant

## 2020-03-29 DIAGNOSIS — G2581 Restless legs syndrome: Secondary | ICD-10-CM

## 2020-04-03 NOTE — Progress Notes (Signed)
Subjective:   Robin Espinoza is a 75 y.o. female who presents for Medicare Annual (Subsequent) preventive examination.  I connected with Robin Espinoza today by telephone and verified that I am speaking with the correct person using two identifiers. Location patient: home Location provider: work Persons participating in the virtual visit: patient, provider.   I discussed the limitations, risks, security and privacy concerns of performing an evaluation and management service by telephone and the availability of in person appointments. I also discussed with the patient that there may be a patient responsible charge related to this service. The patient expressed understanding and verbally consented to this telephonic visit.    Interactive audio and video telecommunications were attempted between this provider and patient, however failed, due to patient having technical difficulties OR patient did not have access to video capability.  We continued and completed visit with audio only.   Review of Systems    N/A  Cardiac Risk Factors include: advanced age (>36men, >5 women);dyslipidemia;hypertension;obesity (BMI >30kg/m2)     Objective:    There were no vitals filed for this visit. There is no height or weight on file to calculate BMI.  Advanced Directives 09/26/2019 09/22/2019 04/03/2019 03/28/2018 03/08/2017  Does Patient Have a Medical Advance Directive? No No No No Yes  Would patient like information on creating a medical advance directive? No - Patient declined Yes (MAU/Ambulatory/Procedural Areas - Information given) Yes (ED - Information included in AVS) Yes (MAU/Ambulatory/Procedural Areas - Information given) -    Current Medications (verified) Outpatient Encounter Medications as of 04/04/2020  Medication Sig  . acetaminophen (TYLENOL) 650 MG CR tablet Take 1,300 mg by mouth every 8 (eight) hours as needed for pain.  . Carboxymethylcellulose Sodium (THERATEARS OP) Place 1 drop into both  eyes daily as needed (dry eyes).  . clotrimazole-betamethasone (LOTRISONE) cream Apply 1 application topically 2 (two) times daily. (Patient taking differently: Apply 1 application topically 2 (two) times daily as needed (rash).)  . docusate sodium (COLACE) 100 MG capsule Take 1 capsule (100 mg total) by mouth 2 (two) times daily. (Patient taking differently: Take 200 mg by mouth daily as needed.)  . etodolac (LODINE) 500 MG tablet Take 1 tablet (500 mg total) by mouth 2 (two) times daily.  Marland Kitchen levothyroxine (SYNTHROID) 112 MCG tablet TAKE 1 TABLET (112 MCG TOTAL) BY MOUTH DAILY BEFORE BREAKFAST.  Marland Kitchen lovastatin (MEVACOR) 40 MG tablet TAKE 1 TABLET BY MOUTH EVERYDAY AT BEDTIME (Patient taking differently: Take 40 mg by mouth at bedtime.)  . metoprolol succinate (TOPROL-XL) 100 MG 24 hr tablet TAKE 1 TABLET (100 MG TOTAL) BY MOUTH DAILY. TAKE WITH OR IMMEDIATELY FOLLOWING A MEAL.  . NON FORMULARY CPAP  . omeprazole (PRILOSEC) 20 MG capsule TAKE 1 CAPSULE BY MOUTH EVERY DAY (Patient taking differently: Take 20 mg by mouth daily.)  . Piroxicam (FELDENE PO) Take 1 tablet by mouth daily at 6 (six) AM.  . Polyethylene Glycol 3350 (MIRALAX PO) Take by mouth as needed.  Marland Kitchen rOPINIRole (REQUIP) 1 MG tablet TAKE 1 TABLET BY MOUTH AT BEDTIME.  . traZODone (DESYREL) 50 MG tablet TAKE 2 TABLETS BY MOUTH EVERY DAY  . venlafaxine XR (EFFEXOR-XR) 75 MG 24 hr capsule TAKE 2 CAPSULES (150 MG TOTAL) BY MOUTH DAILY WITH BREAKFAST.  Marland Kitchen Vitamin D, Ergocalciferol, (DRISDOL) 1.25 MG (50000 UNIT) CAPS capsule TAKE 1 CAPSULE (50,000 UNITS TOTAL) BY MOUTH EVERY 7 (SEVEN) DAYS.  Marland Kitchen Vitamin D3 (VITAMIN D) 25 MCG tablet Take 1,000 Units by mouth daily.  Marland Kitchen  Calcium Polycarbophil (FIBER-CAPS PO) Take 2 capsules by mouth daily. (Patient not taking: Reported on 04/04/2020)   No facility-administered encounter medications on file as of 04/04/2020.    Allergies (verified) Amlodipine and Oxycodone   History: Past Medical History:   Diagnosis Date  . Anxiety   . Depression   . Family history of adverse reaction to anesthesia    sister had a reaction when had a baby  . GERD (gastroesophageal reflux disease)   . Hyperlipidemia 12/21/2005  . Hypertension   . Hypothyroidism   . Insomnia   . OAB (overactive bladder)   . Obstructive sleep apnea   . Osteoarthrosis   . Vitamin D deficiency    Past Surgical History:  Procedure Laterality Date  . COLONOSCOPY     X 2  . Cortisone injections in back    . EYE SURGERY    . 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 TUBAL LIGATION  1981   Family History  Problem Relation Age of Onset  . Diabetes Father   . Breast cancer Neg Hx    Social History   Socioeconomic History  . Marital status: Married    Spouse name: Not on file  . Number of children: 3  . Years of education: Not on file  . Highest education level: Master's degree (e.g., MA, MS, MEng, MEd, MSW, MBA)  Occupational History  . Occupation: retired  Tobacco Use  . Smoking status: Former Games developer  . Smokeless tobacco: Never Used  . Tobacco comment: in college  Vaping Use  . Vaping Use: Never used  Substance and Sexual Activity  . Alcohol use: Yes    Comment: rarely 1 drink  . Drug use: No  . Sexual activity: Not on file  Other Topics Concern  . Not on file  Social History Narrative  . Not on file   Social Determinants of Health   Financial Resource Strain: Low Risk   . Difficulty of Paying Living Expenses: Not hard at all  Food Insecurity: No Food Insecurity  . Worried About Programme researcher, broadcasting/film/video in the Last Year: Never true  . Ran Out of Food in the Last Year: Never true  Transportation Needs: No Transportation Needs  . Lack of Transportation (Medical): No  . Lack of Transportation (Non-Medical): No  Physical Activity: Inactive  . Days of Exercise per  Week: 0 days  . Minutes of Exercise per Session: 0 min  Stress: No Stress Concern Present  . Feeling of Stress : Not at all  Social Connections: Socially Integrated  . Frequency of Communication with Friends and Family: More than three times a week  . Frequency of Social Gatherings with Friends and Family: More than three times a week  . Attends Religious Services: More than 4 times per year  . Active Member of Clubs or Organizations: Yes  . Attends Banker Meetings: More than 4 times per year  . Marital Status: Married    Tobacco Counseling Counseling given: Not Answered Comment: in college   Clinical Intake:  Pre-visit preparation completed: Yes  Pain : No/denies pain (Has knee pains only when standing.)     Nutritional Risks: Non-healing wound (Open wound on nose from CPAP machine. Virtual OV schedule with PCP on 04/05/20.) Diabetes: No  How often do you need to have someone help you when you read instructions,  pamphlets, or other written materials from your doctor or pharmacy?: 1 - Never  Diabetic? No  Interpreter Needed?: No  Information entered by :: [facility], LPN   Activities of Daily Living In your present state of health, do you have any difficulty performing the following activities: 04/04/2020 09/26/2019  Hearing? N -  [place]? N -  Comment - -  Difficulty concentrating or making decisions? N -  Comment - -  Walking or climbing stairs? N -  Comment - -  Dressing or bathing? N -  Doing errands, shopping? N N  Preparing Food and eating ? N -  Using the Toilet? N -  In the past six months, have you accidently leaked urine? Y -  Comment Is incontinent -  Do you have problems with loss of bowel control? N -  Managing your Medications? N -  Managing your Finances? N -  Housekeeping or managing your Housekeeping? N -  Some recent data might be hidden    Patient Care Team: Mar Daring, PA-C as PCP - General (Family  Medicine) Birder Robson, MD as Referring Physician (Ophthalmology) Alcus Dad as Physician Assistant Thornton Park, MD as Referring Physician (Orthopedic Surgery)  Indicate any recent Medical Services you may have received from other than [facility] providers in the past year (date may be approximate).     Assessment:   This is a routine wellness examination for Genna.  Hearing/Vision screen No exam data present  Dietary issues and exercise activities discussed: Current Exercise Habits: The patient does not participate in regular exercise at present, Exercise limited by: None identified  Goals    . DIET - REDUCE PORTION SIZE     Recommend cutting portion sizes in half and eating 3 Kamm meals a day with 2 healthy snack a day.     . Prevent falls     Recommend to remove any items from the home that may cause slips or trips.      Depression Screen PHQ 2/9 Scores 04/04/2020 04/27/2019 04/03/2019 04/03/2019 04/03/2019 03/28/2018 03/28/2018  PHQ - 2 Score 0 0 1 1 0 1 1  PHQ- 9 Score - 4 - - - 7 -    Fall Risk Fall Risk  04/04/2020 04/03/2019 03/28/2018 03/08/2017 03/10/2016  Falls in the past year? 1 1 0 No No  Number falls in past yr: 1 1 0 - -  Injury with Fall? 0 0 0 - -  Risk for fall due to : Orthopedic patient Other (Comment);Impaired mobility - - -  Risk for fall due to: Comment - due to knees buckling from arthritis - - -  Follow up Falls prevention discussed - - - -    FALL RISK PREVENTION PERTAINING TO THE HOME:  Any stairs in or around the home? No  If so, are there any without handrails? No  Home free of loose throw rugs in walkways, pet beds, electrical cords, etc? Yes  Adequate lighting in your home to reduce risk of falls? Yes   ASSISTIVE DEVICES UTILIZED TO PREVENT FALLS:  Life alert? Yes  Use of a cane, walker or w/c? Yes  Grab bars in the bathroom? Yes  Shower chair or bench in shower? No  Elevated toilet seat or a handicapped toilet? No     Cognitive Function: Normal cognitive status assessed by direct observation by this Nurse Health Advisor. No abnormalities found.       6CIT Screen 04/27/2019 03/28/2018 03/08/2017  What Year?  0 points 0 points 0 points  What month? 0 points 0 points 0 points  What time? 0 points 0 points 0 points  Count back from 20 0 points 0 points 0 points  Months in reverse 0 points 0 points 0 points  Repeat phrase 0 points 0 points 2 points  Total Score 0 0 2    Immunizations Immunization History  Administered Date(s) Administered  . Fluad Quad(high Dose 65+) 12/27/2019  . Influenza, High Dose Seasonal PF 12/03/2014, 01/28/2018  . Influenza-Unspecified 01/11/2016, 01/19/2017, 01/06/2019  . Moderna Sars-Covid-2 Vaccination 05/02/2019, 05/30/2019  . Pneumococcal Conjugate-13 03/08/2017  . Pneumococcal Polysaccharide-23 03/20/2011  . Td 03/10/2004    TDAP status: Due, Education has been provided regarding the importance of this vaccine. Advised may receive this vaccine at local pharmacy or Health Dept. Aware to provide a copy of the vaccination record if obtained from local pharmacy or Health Dept. Verbalized acceptance and understanding.  Flu Vaccine status: Up to date  Pneumococcal vaccine status: Up to date  Covid-19 vaccine status: Completed vaccines  Qualifies for Shingles Vaccine? Yes   Zostavax completed No   Shingrix Completed?: No.    Education has been provided regarding the importance of this vaccine. Patient has been advised to call insurance company to determine out of pocket expense if they have not yet received this vaccine. Advised may also receive vaccine at local pharmacy or Health Dept. Verbalized acceptance and understanding.  Screening Tests Health Maintenance  Topic Date Due  . COVID-19 Vaccine (3 - Booster for Moderna series) 11/30/2019  . TETANUS/TDAP  04/26/2020 (Originally 03/10/2014)  . COLONOSCOPY (Pts 45-106yrs Insurance coverage will need to be confirmed)   03/21/2023  . DEXA SCAN  07/12/2024  . INFLUENZA VACCINE  Completed  . Hepatitis C Screening  Completed  . PNA vac Low Risk Adult  Completed    Health Maintenance  Health Maintenance Due  Topic Date Due  . COVID-19 Vaccine (3 - Booster for Moderna series) 11/30/2019    Colorectal cancer screening: Type of screening: Colonoscopy. Completed 11/06/16. Repeat every 10 years  Mammogram status: Completed 07/13/19. Repeat every year  Bone Density status: Completed 07/13/19. Results reflect: Bone density results: OSTEOPENIA. Repeat every 5 years.  Lung Cancer Screening: (Low Dose CT Chest recommended if Age 23-80 years, 30 pack-year currently smoking OR have quit w/in 15years.) does not qualify.    Additional Screening:  Hepatitis C Screening: Up to date  Vision Screening: Recommended annual ophthalmology exams for early detection of glaucoma and other disorders of the eye. Is the patient up to date with their annual eye exam?  Yes  Who is the provider or what is the name of the office in which the patient attends annual eye exams? Dr George Ina @ [place] If pt is not established with a provider, would they like to be referred to a provider to establish care? No .   Dental Screening: Recommended annual dental exams for proper oral hygiene  Community Resource Referral / Chronic Care Management: CRR required this visit?  No   CCM required this visit?  No      Plan:     I have personally reviewed and noted the following in the patient's chart:   . Medical and social history . Use of alcohol, tobacco or illicit drugs  . Current medications and supplements . Functional ability and status . Nutritional status . Physical activity . Advanced directives . List of other physicians . Hospitalizations, surgeries, and ER visits in previous  12 months . Vitals . Screenings to include cognitive, depression, and falls . Referrals and appointments  In addition, I have reviewed and discussed  with patient certain preventive protocols, quality metrics, and best practice recommendations. A written personalized care plan for preventive services as well as general preventive health recommendations were provided to patient.     Aayden Cefalu [place], [place]   3/81/7711   Nurse Notes: Pt has received her Covid booster but did not have the vaccine card at the time of this visit. Requested vaccine card at next apt or to be scanned into MyChart.

## 2020-04-04 ENCOUNTER — Other Ambulatory Visit: Payer: Self-pay

## 2020-04-04 ENCOUNTER — Ambulatory Visit (INDEPENDENT_AMBULATORY_CARE_PROVIDER_SITE_OTHER): Payer: PPO

## 2020-04-04 DIAGNOSIS — Z0000 Encounter for general adult medical examination without abnormal findings: Secondary | ICD-10-CM

## 2020-04-08 ENCOUNTER — Telehealth: Payer: PPO | Admitting: Physician Assistant

## 2020-04-10 ENCOUNTER — Ambulatory Visit: Payer: PPO | Admitting: Physician Assistant

## 2020-04-11 DIAGNOSIS — L089 Local infection of the skin and subcutaneous tissue, unspecified: Secondary | ICD-10-CM | POA: Diagnosis not present

## 2020-04-11 DIAGNOSIS — M6281 Muscle weakness (generalized): Secondary | ICD-10-CM | POA: Diagnosis not present

## 2020-04-11 DIAGNOSIS — R2689 Other abnormalities of gait and mobility: Secondary | ICD-10-CM | POA: Diagnosis not present

## 2020-04-13 ENCOUNTER — Other Ambulatory Visit: Payer: Self-pay | Admitting: Physician Assistant

## 2020-04-13 DIAGNOSIS — I10 Essential (primary) hypertension: Secondary | ICD-10-CM

## 2020-04-15 DIAGNOSIS — M545 Low back pain, unspecified: Secondary | ICD-10-CM | POA: Diagnosis not present

## 2020-04-15 DIAGNOSIS — Z96651 Presence of right artificial knee joint: Secondary | ICD-10-CM | POA: Diagnosis not present

## 2020-04-15 DIAGNOSIS — M6281 Muscle weakness (generalized): Secondary | ICD-10-CM | POA: Diagnosis not present

## 2020-04-15 DIAGNOSIS — R2689 Other abnormalities of gait and mobility: Secondary | ICD-10-CM | POA: Diagnosis not present

## 2020-04-22 ENCOUNTER — Other Ambulatory Visit: Payer: Self-pay | Admitting: Physician Assistant

## 2020-04-22 DIAGNOSIS — G2581 Restless legs syndrome: Secondary | ICD-10-CM

## 2020-04-22 NOTE — Telephone Encounter (Signed)
Requested Prescriptions  Pending Prescriptions Disp Refills  . rOPINIRole (REQUIP) 1 MG tablet [Pharmacy Med Name: ROPINIROLE HCL 1 MG TABLET] 30 tablet 0    Sig: TAKE 1 TABLET BY MOUTH EVERYDAY AT BEDTIME     Neurology:  Parkinsonian Agents Failed - 04/22/2020  1:30 PM      Failed - Last BP in normal range    BP Readings from Last 1 Encounters:  12/27/19 (!) 159/92         Passed - Valid encounter within last 12 months    Recent Outpatient Visits          3 months ago Bilateral impacted cerumen   [place], [place]   5 months ago Hair loss   [place], [place]   6 months ago Status post right knee replacement   [place], [place], [place]   8 months ago Pre-op exam   [place], [place]   8 months ago Foot swelling   [place], [place], [place]             Courtesy refill

## 2020-04-25 DIAGNOSIS — R2689 Other abnormalities of gait and mobility: Secondary | ICD-10-CM | POA: Diagnosis not present

## 2020-04-25 DIAGNOSIS — M6281 Muscle weakness (generalized): Secondary | ICD-10-CM | POA: Diagnosis not present

## 2020-05-16 ENCOUNTER — Other Ambulatory Visit: Payer: Self-pay | Admitting: Physician Assistant

## 2020-05-16 NOTE — Telephone Encounter (Signed)
Requested medications are due for refill today yes  Requested medications are on the active medication list yes  Last refill 1/26  Last visit 04/04/20  Future visit scheduled 04/10/21  Notes to clinic Unsure if this med was to be continued, unsure what she takes it for, knee surgery complete, please assess.

## 2020-05-22 ENCOUNTER — Other Ambulatory Visit: Payer: Self-pay | Admitting: Physician Assistant

## 2020-05-22 DIAGNOSIS — G2581 Restless legs syndrome: Secondary | ICD-10-CM

## 2020-05-23 DIAGNOSIS — L98498 Non-pressure chronic ulcer of skin of other sites with other specified severity: Secondary | ICD-10-CM | POA: Diagnosis not present

## 2020-05-23 DIAGNOSIS — L98499 Non-pressure chronic ulcer of skin of other sites with unspecified severity: Secondary | ICD-10-CM | POA: Diagnosis not present

## 2020-05-30 DIAGNOSIS — G4733 Obstructive sleep apnea (adult) (pediatric): Secondary | ICD-10-CM | POA: Diagnosis not present

## 2020-05-31 ENCOUNTER — Telehealth: Payer: Self-pay | Admitting: *Deleted

## 2020-05-31 DIAGNOSIS — M545 Low back pain, unspecified: Secondary | ICD-10-CM

## 2020-05-31 DIAGNOSIS — G8929 Other chronic pain: Secondary | ICD-10-CM

## 2020-05-31 NOTE — Telephone Encounter (Signed)
Referral placed.

## 2020-05-31 NOTE — Telephone Encounter (Signed)
Copied from [place] [id number]. Topic: Referral - Request for Referral >> May 31, 2020  9:43 AM Alanda Slim E wrote: Has patient seen PCP for this complaint? Yes  *If NO, is insurance requiring patient see PCP for this issue before PCP can refer them? Referral for which specialty: Ortho  Preferred provider/office: Dr. Lanae Crumbly at [facility]  Reason for referral: Back pain

## 2020-06-01 ENCOUNTER — Other Ambulatory Visit: Payer: Self-pay | Admitting: Physician Assistant

## 2020-06-01 DIAGNOSIS — F5101 Primary insomnia: Secondary | ICD-10-CM

## 2020-06-01 NOTE — Telephone Encounter (Signed)
Requested medications are due for refill today yes  Requested medications are on the active medication list yes  Last refill 12/7  Last visit I do not see this med/dx mentioned in [facility] notes  Future visit scheduled 08/2021  Notes to clinic Please assess.

## 2020-06-03 DIAGNOSIS — M5136 Other intervertebral disc degeneration, lumbar region: Secondary | ICD-10-CM | POA: Diagnosis not present

## 2020-06-14 ENCOUNTER — Other Ambulatory Visit: Payer: Self-pay | Admitting: Physician Assistant

## 2020-06-14 DIAGNOSIS — F32A Depression, unspecified: Secondary | ICD-10-CM

## 2020-06-14 DIAGNOSIS — K219 Gastro-esophageal reflux disease without esophagitis: Secondary | ICD-10-CM

## 2020-06-14 NOTE — Telephone Encounter (Signed)
Requested medications are due for refill today yes  Requested medications are on the active medication list yes  Last refill 02/2020  Last visit I do not see these meds/dx addressed in an OV note  Future visit scheduled 03/2021 (one year from annual wellness exam)  Notes to clinic Failed protocol due to no valid visit within 6 months for Depression, and 12 months for GERD.

## 2020-06-24 DIAGNOSIS — M545 Low back pain, unspecified: Secondary | ICD-10-CM | POA: Diagnosis not present

## 2020-06-24 DIAGNOSIS — M47817 Spondylosis without myelopathy or radiculopathy, lumbosacral region: Secondary | ICD-10-CM | POA: Diagnosis not present

## 2020-06-25 ENCOUNTER — Other Ambulatory Visit: Payer: Self-pay | Admitting: Physician Assistant

## 2020-06-25 DIAGNOSIS — E039 Hypothyroidism, unspecified: Secondary | ICD-10-CM

## 2020-06-26 ENCOUNTER — Other Ambulatory Visit: Payer: Self-pay | Admitting: Physician Assistant

## 2020-06-26 DIAGNOSIS — G2581 Restless legs syndrome: Secondary | ICD-10-CM

## 2020-06-28 DIAGNOSIS — G8310 Monoplegia of lower limb affecting unspecified side: Secondary | ICD-10-CM | POA: Diagnosis not present

## 2020-06-28 DIAGNOSIS — M25562 Pain in left knee: Secondary | ICD-10-CM | POA: Diagnosis not present

## 2020-06-28 DIAGNOSIS — M25561 Pain in right knee: Secondary | ICD-10-CM | POA: Diagnosis not present

## 2020-07-03 DIAGNOSIS — H903 Sensorineural hearing loss, bilateral: Secondary | ICD-10-CM | POA: Diagnosis not present

## 2020-07-03 DIAGNOSIS — H6123 Impacted cerumen, bilateral: Secondary | ICD-10-CM | POA: Diagnosis not present

## 2020-07-12 ENCOUNTER — Other Ambulatory Visit: Payer: Self-pay | Admitting: Physician Assistant

## 2020-07-12 DIAGNOSIS — I10 Essential (primary) hypertension: Secondary | ICD-10-CM

## 2020-07-15 DIAGNOSIS — M47817 Spondylosis without myelopathy or radiculopathy, lumbosacral region: Secondary | ICD-10-CM | POA: Diagnosis not present

## 2020-07-15 DIAGNOSIS — M545 Low back pain, unspecified: Secondary | ICD-10-CM | POA: Diagnosis not present

## 2020-07-26 DIAGNOSIS — M47817 Spondylosis without myelopathy or radiculopathy, lumbosacral region: Secondary | ICD-10-CM | POA: Diagnosis not present

## 2020-07-30 ENCOUNTER — Ambulatory Visit: Payer: PPO | Admitting: Family Medicine

## 2020-07-30 NOTE — Progress Notes (Deleted)
Established patient visit   Patient: Robin Espinoza   DOB: [birth year]   75 y.o. Female  MRN: [id number] Visit Date: 07/30/2020  Today's healthcare provider: Vernie Murders, PA-C   No chief complaint on file.  Subjective    HPI  Hypertension, follow-up  BP Readings from Last 3 Encounters:  12/27/19 (!) 159/92  11/16/19 (!) 147/90  10/20/19 (!) 136/90   Wt Readings from Last 3 Encounters:  11/16/19 244 lb (110.7 kg)  10/20/19 (!) 240 lb (108.9 kg)  09/26/19 246 lb 4.1 oz (111.7 kg)     She was last seen for hypertension 6 months ago.  BP at that visit was as above. Management since that visit includes none.  She reports good compliance with treatment. She is not having side effects.   Use of agents associated with hypertension: none.   Outside blood pressures are {***enter patient reported home BP readings, or 'not being checked':1}. Symptoms: {Yes/No:20286} chest pain {Yes/No:20286} chest pressure  {Yes/No:20286} palpitations {Yes/No:20286} syncope  {Yes/No:20286} dyspnea {Yes/No:20286} orthopnea  {Yes/No:20286} paroxysmal nocturnal dyspnea {Yes/No:20286} lower extremity edema   Pertinent labs: Lab Results  Component Value Date   CHOL 177 04/07/2019   HDL 56 04/07/2019   LDLCALC 93 04/07/2019   TRIG 161 (H) 04/07/2019   CHOLHDL 3.2 04/07/2019   Lab Results  Component Value Date   NA 140 11/16/2019   K 4.6 11/16/2019   CREATININE 0.79 11/16/2019   GFRNONAA 74 11/16/2019   GFRAA 85 11/16/2019   GLUCOSE 81 11/16/2019     The 10-year ASCVD risk score Mikey Bussing DC Jr., et al., 2013) is: 36.5%   ---------------------------------------------------------------------------------------------------   {Show patient history (optional):23778::" "}   Medications: Outpatient Medications Prior to Visit  Medication Sig  . acetaminophen (TYLENOL) 650 MG CR tablet Take 1,300 mg by mouth every 8 (eight) hours as needed for pain.  . Calcium Polycarbophil (FIBER-CAPS  PO) Take 2 capsules by mouth daily. (Patient not taking: Reported on 04/04/2020)  . Carboxymethylcellulose Sodium (THERATEARS OP) Place 1 drop into both eyes daily as needed (dry eyes).  . clotrimazole-betamethasone (LOTRISONE) cream Apply 1 application topically 2 (two) times daily. (Patient taking differently: Apply 1 application topically 2 (two) times daily as needed (rash).)  . docusate sodium (COLACE) 100 MG capsule Take 1 capsule (100 mg total) by mouth 2 (two) times daily. (Patient taking differently: Take 200 mg by mouth daily as needed.)  . etodolac (LODINE) 500 MG tablet TAKE 1 TABLET BY MOUTH TWICE A DAY  . levothyroxine (SYNTHROID) 112 MCG tablet TAKE 1 TABLET (112 MCG TOTAL) BY MOUTH DAILY BEFORE BREAKFAST.  Marland Kitchen lovastatin (MEVACOR) 40 MG tablet TAKE 1 TABLET BY MOUTH EVERYDAY AT BEDTIME  . metoprolol succinate (TOPROL-XL) 100 MG 24 hr tablet TAKE 1 TABLET (100 MG TOTAL) BY MOUTH DAILY. TAKE WITH OR IMMEDIATELY FOLLOWING A MEAL.  . NON FORMULARY CPAP  . omeprazole (PRILOSEC) 20 MG capsule Take 1 capsule (20 mg total) by mouth daily.  . Piroxicam (FELDENE PO) Take 1 tablet by mouth daily at 6 (six) AM.  . Polyethylene Glycol 3350 (MIRALAX PO) Take by mouth as needed.  Marland Kitchen rOPINIRole (REQUIP) 1 MG tablet TAKE 1 TABLET BY MOUTH EVERYDAY AT BEDTIME  . traZODone (DESYREL) 50 MG tablet TAKE 2 TABLETS BY MOUTH EVERY DAY  . venlafaxine XR (EFFEXOR-XR) 75 MG 24 hr capsule TAKE 2 CAPSULES (150 MG TOTAL) BY MOUTH DAILY WITH BREAKFAST.  Marland Kitchen Vitamin D, Ergocalciferol, (DRISDOL) 1.25 MG (50000 UNIT) CAPS  capsule TAKE 1 CAPSULE (50,000 UNITS TOTAL) BY MOUTH EVERY 7 (SEVEN) DAYS.  Marland Kitchen Vitamin D3 (VITAMIN D) 25 MCG tablet Take 1,000 Units by mouth daily.   No facility-administered medications prior to visit.    Review of Systems  {Labs  Heme  Chem  Endocrine  Serology  Results Review (optional):23779::" "}   Objective    There were no vitals taken for this visit. {Show previous vital signs  (optional):23777::" "}   Physical Exam  ***  No results found for any visits on 07/30/20.  Assessment & Plan     ***  No follow-ups on file.      {provider attestation***:1}   Vernie Murders, Hershal Coria  [facility] [phone] (phone) [phone] (fax)  [place]

## 2020-08-05 ENCOUNTER — Other Ambulatory Visit: Payer: Self-pay | Admitting: Family Medicine

## 2020-08-05 ENCOUNTER — Other Ambulatory Visit: Payer: Self-pay | Admitting: Physician Assistant

## 2020-08-05 DIAGNOSIS — I10 Essential (primary) hypertension: Secondary | ICD-10-CM

## 2020-08-05 DIAGNOSIS — E559 Vitamin D deficiency, unspecified: Secondary | ICD-10-CM

## 2020-08-05 DIAGNOSIS — G2581 Restless legs syndrome: Secondary | ICD-10-CM

## 2020-08-05 NOTE — Telephone Encounter (Signed)
Requested Prescriptions  Pending Prescriptions Disp Refills  . Vitamin D, Ergocalciferol, (DRISDOL) 1.25 MG (50000 UNIT) CAPS capsule [Pharmacy Med Name: VITAMIN D2 1.25MG (50,000 UNIT)] 12 capsule 1    Sig: TAKE 1 CAPSULE (50,000 UNITS TOTAL) BY MOUTH EVERY 7 (SEVEN) DAYS     Endocrinology:  Vitamins - Vitamin D Supplementation Failed - 08/05/2020 11:49 AM      Failed - 50,000 IU strengths are not delegated      Failed - Phosphate in normal range and within 360 days    No results found for: PHOS       Failed - Vitamin D in normal range and within 360 days    Vit D, 25-Hydroxy  Date Value Ref Range Status  04/07/2019 26.0 (L) 30.0 - 100.0 ng/mL Final    Comment:    Vitamin D deficiency has been defined by the Institute of Medicine and an Endocrine Society practice guideline as a level of serum 25-OH vitamin D less than 20 ng/mL (1,2). The Endocrine Society went on to further define vitamin D insufficiency as a level between 21 and 29 ng/mL (2). 1. IOM (Institute of Medicine). 2010. Dietary reference    intakes for calcium and D. [place]: The    [employer]. 2. Holick MF, Binkley [place], Bischoff-Ferrari HA, et al.    Evaluation, treatment, and prevention of vitamin D    deficiency: an Endocrine Society clinical practice    guideline. JCEM. 2011 Jul; 96(7):1911-30.          Passed - Ca in normal range and within 360 days    Calcium  Date Value Ref Range Status  11/16/2019 9.4 8.7 - 10.3 mg/dL Final   Calcium, Total  Date Value Ref Range Status  05/03/2012 9.0 8.5 - 10.1 mg/dL Final         Passed - Valid encounter within last 12 months    Recent Outpatient Visits          7 months ago Bilateral impacted cerumen   [facility] [place], [place], [place]   8 months ago Hair loss   [facility] [name], [place]   9 months ago Status post right knee replacement   [facility] [name], [place]   11  months ago Pre-op exam   [facility] [name], [place]   12 months ago Foot swelling   [facility] [place], Fabio Bering [place], [place]      Future Appointments            In 2 weeks Chrismon, Vickki Muff, PA-C [employer], [facility]           . rOPINIRole (REQUIP) 1 MG tablet [Pharmacy Med Name: ROPINIROLE HCL 1 MG TABLET] 90 tablet 0    Sig: TAKE 1 TABLET BY MOUTH EVERYDAY AT BEDTIME     Neurology:  Parkinsonian Agents Failed - 08/05/2020 11:49 AM      Failed - Last BP in normal range    BP Readings from Last 1 Encounters:  12/27/19 (!) 159/92         Passed - Valid encounter within last 12 months    Recent Outpatient Visits          7 months ago Bilateral impacted cerumen   [place], [place]   8 months ago Hair loss   [facility] [name], [place]   9 months ago Status post right knee replacement  [facility] [name], [place]   11 months ago Pre-op exam   [facility] [name] M, [place]   12 months ago Foot swelling   [facility] [place], Fabio Bering M, [place]      Future Appointments            In 2 weeks Chrismon, Vickki Muff, PA-C [employer], [facility]           . lovastatin (MEVACOR) 40 MG tablet [employer] Med Name: LOVASTATIN 40 MG TABLET] 90 tablet 0    Sig: TAKE 1 TABLET BY MOUTH EVERYDAY AT BEDTIME     Cardiovascular:  Antilipid - Statins Failed - 08/05/2020 11:49 AM      Failed - Total Cholesterol in normal range and within 360 days    Cholesterol, Total  Date Value Ref Range Status  04/07/2019 177 100 - 199 mg/dL Final         Failed - LDL in normal range and within 360 days    LDL Chol Calc (NIH)  Date Value Ref Range Status  04/07/2019 93 0 - 99 mg/dL Final         Failed - HDL in normal range and within 360 days    HDL  Date Value Ref Range Status  04/07/2019 56 >39 mg/dL Final          Failed - Triglycerides in normal range and within 360 days    Triglycerides  Date Value Ref Range Status  04/07/2019 161 (H) 0 - 149 mg/dL Final         Passed - Patient is not pregnant      Passed - Valid encounter within last 12 months    Recent Outpatient Visits          7 months ago Bilateral impacted cerumen   [place], [place], [place]   8 months ago Hair loss   [facility] [name] M, [place]   9 months ago Status post right knee replacement   [facility] [name] M, [place]   11 months ago Pre-op exam   [facility] [name] M, [place]   12 months ago Foot swelling   [place], Wendee [place], [place]      Future Appointments            In 2 weeks Chrismon, Vickki Muff, PA-C [employer], [place]

## 2020-08-05 NOTE — Telephone Encounter (Signed)
Requested Prescriptions  Pending Prescriptions Disp Refills  . Vitamin D, Ergocalciferol, (DRISDOL) 1.25 MG (50000 UNIT) CAPS capsule [Pharmacy Med Name: VITAMIN D2 1.25MG (50,000 UNIT)] 12 capsule 1    Sig: TAKE 1 CAPSULE (50,000 UNITS TOTAL) BY MOUTH EVERY 7 (SEVEN) DAYS     Endocrinology:  Vitamins - Vitamin D Supplementation Failed - 08/05/2020 11:49 AM      Failed - 50,000 IU strengths are not delegated      Failed - Phosphate in normal range and within 360 days    No results found for: PHOS       Failed - Vitamin D in normal range and within 360 days    Vit D, 25-Hydroxy  Date Value Ref Range Status  04/07/2019 26.0 (L) 30.0 - 100.0 ng/mL Final    Comment:    Vitamin D deficiency has been defined by the Institute of Medicine and an Endocrine Society practice guideline as a level of serum 25-OH vitamin D less than 20 ng/mL (1,2). The Endocrine Society went on to further define vitamin D insufficiency as a level between 21 and 29 ng/mL (2). 1. IOM (Institute of Medicine). 2010. Dietary reference    intakes for calcium and D. [place]: The    [employer]. 2. Holick MF, Binkley [place], Bischoff-Ferrari HA, et al.    Evaluation, treatment, and prevention of vitamin D    deficiency: an Endocrine Society clinical practice    guideline. JCEM. 2011 Jul; 96(7):1911-30.          Passed - Ca in normal range and within 360 days    Calcium  Date Value Ref Range Status  11/16/2019 9.4 8.7 - 10.3 mg/dL Final   Calcium, Total  Date Value Ref Range Status  05/03/2012 9.0 8.5 - 10.1 mg/dL Final         Passed - Valid encounter within last 12 months    Recent Outpatient Visits          7 months ago Bilateral impacted cerumen   [facility] [place], [place], [place]   8 months ago Hair loss   [facility] [name] M, [place]   9 months ago Status post right knee replacement   [facility] [name] M, [place]   11  months ago Pre-op exam   [facility] [name], [place]   12 months ago Foot swelling   [facility] [place], Fabio Bering M, [place]      Future Appointments            In 2 weeks Chrismon, Vickki Muff, PA-C [employer], [facility]           . lovastatin (MEVACOR) 40 MG tablet [Pharmacy Med Name: LOVASTATIN 40 MG TABLET] 90 tablet 0    Sig: TAKE 1 TABLET BY MOUTH EVERYDAY AT BEDTIME     Cardiovascular:  Antilipid - Statins Failed - 08/05/2020 11:49 AM      Failed - Total Cholesterol in normal range and within 360 days    Cholesterol, Total  Date Value Ref Range Status  04/07/2019 177 100 - 199 mg/dL Final         Failed - LDL in normal range and within 360 days    LDL Chol Calc (NIH)  Date Value Ref Range Status  04/07/2019 93 0 - 99 mg/dL Final         Failed - HDL in normal range and within 360 days    HDL  Date Value Ref Range Status  04/07/2019 56 >39 mg/dL Final         Failed - Triglycerides in normal range and within 360 days    Triglycerides  Date Value Ref Range Status  04/07/2019 161 (H) 0 - 149 mg/dL Final         Passed - Patient is not pregnant      Passed - Valid encounter within last 12 months    Recent Outpatient Visits          7 months ago Bilateral impacted cerumen   [place], [place], [place]   8 months ago Hair loss   [facility] [name], [place]   9 months ago Status post right knee replacement   [facility] [name] M, [place]   11 months ago Pre-op exam   [facility] [name] M, [place]   12 months ago Foot swelling   [facility] [place], Fabio Bering M, [place]      Future Appointments            In 2 weeks Chrismon, Vickki Muff, PA-C [employer], [facility]           Signed Prescriptions Disp Refills   rOPINIRole (REQUIP) 1 MG tablet 90 tablet 0    Sig: TAKE 1 TABLET BY MOUTH EVERYDAY  AT BEDTIME     Neurology:  Parkinsonian Agents Failed - 08/05/2020 11:49 AM      Failed - Last BP in normal range    BP Readings from Last 1 Encounters:  12/27/19 (!) 159/92         Passed - Valid encounter within last 12 months    Recent Outpatient Visits          7 months ago Bilateral impacted cerumen   [place], [place]   8 months ago Hair loss   [facility] [name] M, [place]   9 months ago Status post right knee replacement   [facility] [name] M, [place]   11 months ago Pre-op exam   [facility] [name] [facility], [place]   12 months ago Foot swelling   [facility] [place], [place], [place]      Future Appointments            In 2 weeks Chrismon, Vickki Muff, PA-C [employer], [place]

## 2020-08-05 NOTE — Telephone Encounter (Signed)
Requested Prescriptions  Pending Prescriptions Disp Refills  . metoprolol succinate (TOPROL-XL) 100 MG 24 hr tablet [Pharmacy Med Name: METOPROLOL SUCC ER 100 MG TAB] 30 tablet 0    Sig: TAKE 1 TABLET BY MOUTH DAILY. TAKE WITH OR IMMEDIATELY FOLLOWING A MEAL.     Cardiovascular:  Beta Blockers Failed - 08/05/2020 11:49 AM      Failed - Last BP in normal range    BP Readings from Last 1 Encounters:  12/27/19 (!) 159/92         Failed - Valid encounter within last 6 months    Recent Outpatient Visits          7 months ago Bilateral impacted cerumen   [place], [place]   8 months ago Hair loss   [facility] [name] [place], [place]   9 months ago Status post right knee replacement   [facility] [name] M, [place]   11 months ago Pre-op exam   [facility] [name] M, [place]   12 months ago Foot swelling   [facility] [place], Fabio Bering M, [place]      Future Appointments            In 2 weeks Chrismon, Vickki Muff, PA-C [employer], [facility]           Passed - Last Heart Rate in normal range    Pulse Readings from Last 1 Encounters:  12/27/19 67

## 2020-08-05 NOTE — Telephone Encounter (Signed)
Requested medication (s) are due for refill today: yes  Requested medication (s) are on the active medication list: yes  Last refill:  06/08/20  Future visit scheduled: yes  Notes to clinic:  not delegated    Requested Prescriptions  Pending Prescriptions Disp Refills   Vitamin D, Ergocalciferol, (DRISDOL) 1.25 MG (50000 UNIT) CAPS capsule [Pharmacy Med Name: VITAMIN D2 1.25MG (50,000 UNIT)] 12 capsule 1    Sig: TAKE 1 CAPSULE (50,000 UNITS TOTAL) BY MOUTH EVERY 7 (SEVEN) DAYS      Endocrinology:  Vitamins - Vitamin D Supplementation Failed - 08/05/2020 11:49 AM      Failed - 50,000 IU strengths are not delegated      Failed - Phosphate in normal range and within 360 days    No results found for: PHOS        Failed - Vitamin D in normal range and within 360 days    Vit D, 25-Hydroxy  Date Value Ref Range Status  04/07/2019 26.0 (L) 30.0 - 100.0 ng/mL Final    Comment:    Vitamin D deficiency has been defined by the Institute of Medicine and an Endocrine Society practice guideline as a level of serum 25-OH vitamin D less than 20 ng/mL (1,2). The Endocrine Society went on to further define vitamin D insufficiency as a level between 21 and 29 ng/mL (2). 1. IOM (Institute of Medicine). 2010. Dietary reference    intakes for calcium and D. [place]: The    [employer]. 2. Holick MF, Binkley [place], Bischoff-Ferrari HA, et al.    Evaluation, treatment, and prevention of vitamin D    deficiency: an Endocrine Society clinical practice    guideline. JCEM. 2011 Jul; 96(7):1911-30.           Passed - Ca in normal range and within 360 days    Calcium  Date Value Ref Range Status  11/16/2019 9.4 8.7 - 10.3 mg/dL Final   Calcium, Total  Date Value Ref Range Status  05/03/2012 9.0 8.5 - 10.1 mg/dL Final          Passed - Valid encounter within last 12 months    Recent Outpatient Visits           7 months ago Bilateral impacted cerumen   [facility] [place], [place], [place]   8 months ago Hair loss   [facility] [name], [place]   9 months ago Status post right knee replacement   [facility] [name] M, [place]   11 months ago Pre-op exam   [facility] [name], [place]   12 months ago Foot swelling   [facility], Fabio Bering M, [place]       Future Appointments             In 2 weeks Chrismon, Vickki Muff, PA-C [employer], [facility]              Signed Prescriptions Disp Refills   rOPINIRole (REQUIP) 1 MG tablet 90 tablet 0    Sig: TAKE 1 TABLET BY MOUTH EVERYDAY AT BEDTIME      Neurology:  Parkinsonian Agents Failed - 08/05/2020 11:49 AM      Failed - Last BP in normal range    BP Readings from Last 1 Encounters:  12/27/19 (!) 159/92          Passed - Valid encounter within last 12 months    Recent Outpatient Visits  7 months ago Bilateral impacted cerumen   [facility] [place], [place], [place]   8 months ago Hair loss   [facility] [name], [place]   9 months ago Status post right knee replacement   [facility] [name], [place]   11 months ago Pre-op exam   [facility] [name], [place]   12 months ago Foot swelling   [facility] [place], Fabio Bering [place], [place]       Future Appointments             In 2 weeks Chrismon, Vickki Muff, PA-C [employer], [facility]               lovastatin (MEVACOR) 40 MG tablet 90 tablet 0    Sig: TAKE 1 TABLET BY MOUTH EVERYDAY AT BEDTIME      Cardiovascular:  Antilipid - Statins Failed - 08/05/2020 11:49 AM      Failed - Total Cholesterol in normal range and within 360 days    Cholesterol, Total  Date Value Ref Range Status  04/07/2019 177 100 - 199 mg/dL Final          Failed - LDL in normal range and within 360 days    LDL Chol Calc (NIH)   Date Value Ref Range Status  04/07/2019 93 0 - 99 mg/dL Final          Failed - HDL in normal range and within 360 days    HDL  Date Value Ref Range Status  04/07/2019 56 >39 mg/dL Final          Failed - Triglycerides in normal range and within 360 days    Triglycerides  Date Value Ref Range Status  04/07/2019 161 (H) 0 - 149 mg/dL Final          Passed - Patient is not pregnant      Passed - Valid encounter within last 12 months    Recent Outpatient Visits           7 months ago Bilateral impacted cerumen   [place], [place], [place]   8 months ago Hair loss   [facility] [name], [place]   9 months ago Status post right knee replacement   [facility] [name] M, [place]   11 months ago Pre-op exam   [facility] [name], [place]   12 months ago Foot swelling   [place], [place], [place]       Future Appointments             In 2 weeks Chrismon, Vickki Muff, PA-C [employer], [place]

## 2020-08-15 DIAGNOSIS — M25562 Pain in left knee: Secondary | ICD-10-CM | POA: Diagnosis not present

## 2020-08-15 DIAGNOSIS — M25561 Pain in right knee: Secondary | ICD-10-CM | POA: Diagnosis not present

## 2020-08-20 ENCOUNTER — Ambulatory Visit (INDEPENDENT_AMBULATORY_CARE_PROVIDER_SITE_OTHER): Payer: PPO | Admitting: Family Medicine

## 2020-08-20 ENCOUNTER — Other Ambulatory Visit: Payer: Self-pay

## 2020-08-20 ENCOUNTER — Encounter: Payer: Self-pay | Admitting: Family Medicine

## 2020-08-20 VITALS — BP 170/76 | HR 67 | Resp 16 | Ht 65.0 in | Wt 242.0 lb

## 2020-08-20 DIAGNOSIS — E039 Hypothyroidism, unspecified: Secondary | ICD-10-CM

## 2020-08-20 DIAGNOSIS — I10 Essential (primary) hypertension: Secondary | ICD-10-CM | POA: Diagnosis not present

## 2020-08-20 DIAGNOSIS — Z6841 Body Mass Index (BMI) 40.0 and over, adult: Secondary | ICD-10-CM | POA: Diagnosis not present

## 2020-08-20 DIAGNOSIS — M159 Polyosteoarthritis, unspecified: Secondary | ICD-10-CM | POA: Diagnosis not present

## 2020-08-20 NOTE — Progress Notes (Signed)
Established patient visit   Patient: Robin Espinoza   DOB: 19-Oct-1945   75 y.o. Female  MRN: [id number] Visit Date: 08/20/2020  Today's healthcare provider: Vernie Murders, PA-C   Chief Complaint  Patient presents with   Hypertension   Subjective    HPI  Hypertension, follow-up  BP Readings from Last 3 Encounters:  08/20/20 (!) 170/76  12/27/19 (!) 159/92  11/16/19 (!) 147/90   Wt Readings from Last 3 Encounters:  08/20/20 242 lb (109.8 kg)  11/16/19 244 lb (110.7 kg)  10/20/19 (!) 240 lb (108.9 kg)     She was last seen for hypertension 1 years ago.  BP at that visit was 134/86. Management since that visit includes no medication changes.  She reports good compliance with treatment. She is not having side effects.  She is following a Regular diet. She is not exercising. She does not smoke.  Use of agents associated with hypertension: none.   Outside blood pressures are checked occasionally. Symptoms: No chest pain No chest pressure  No palpitations No syncope  No dyspnea No orthopnea  No paroxysmal nocturnal dyspnea No lower extremity edema   Pertinent labs: Lab Results  Component Value Date   CHOL 177 04/07/2019   HDL 56 04/07/2019   LDLCALC 93 04/07/2019   TRIG 161 (H) 04/07/2019   CHOLHDL 3.2 04/07/2019   Lab Results  Component Value Date   NA 140 11/16/2019   K 4.6 11/16/2019   CREATININE 0.79 11/16/2019   GFRNONAA 74 11/16/2019   GFRAA 85 11/16/2019   GLUCOSE 81 11/16/2019     The 10-year ASCVD risk score Mikey Bussing DC Jr., et al., 2013) is: 33.9%   --------------------------------------------------------------------------------------------------- Hypothyroid, follow-up  Lab Results  Component Value Date   TSH 1.350 11/16/2019   TSH 3.100 04/07/2019   TSH 2.170 04/19/2018   T4TOTAL 8.7 02/06/2016   Wt Readings from Last 3 Encounters:  08/20/20 242 lb (109.8 kg)  11/16/19 244 lb (110.7 kg)  10/20/19 (!) 240 lb (108.9 kg)    She  was last seen for hypothyroid 1 years ago.  Management since that visit includes no medication changes. She reports good compliance with treatment. She is not having side effects.   Symptoms: No change in energy level No constipation  No diarrhea No heat / cold intolerance  No nervousness No palpitations  No weight changes     Past Medical History:  Diagnosis Date   Anxiety    Depression    Family history of adverse reaction to anesthesia    sister had a reaction when had a baby   GERD (gastroesophageal reflux disease)    Hyperlipidemia 12/21/2005   Hypertension    Hypothyroidism    Insomnia    OAB (overactive bladder)    Obstructive sleep apnea    Osteoarthrosis    Vitamin D deficiency    Past Surgical History:  Procedure Laterality Date   COLONOSCOPY     X 2   Cortisone injections in back     EYE SURGERY     TOTAL KNEE ARTHROPLASTY 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   Procedure: RIGHT TOTAL KNEE ARTHROPLASTY;  Surgeon: Thornton Park, MD;  Location: [facility] ORS;  Service: Orthopedics;  Laterality: Right;   TUBAL LIGATION  1981   Social History   Tobacco Use   Smoking status: Former Smoker   Smokeless tobacco: Never Used   Tobacco comment: in college  Vaping Use   Vaping Use: Never used  Substance Use Topics  Alcohol use: Yes    Comment: rarely 1 drink   Drug use: No   Family History  Problem Relation Age of Onset   Diabetes Father    Breast cancer Neg Hx    Allergies  Allergen Reactions   Amlodipine     Ankle swelling    Oxycodone Other (See Comments)    hallucinated    Medications: Outpatient Medications Prior to Visit  Medication Sig   acetaminophen (TYLENOL) 650 MG CR tablet Take 1,300 mg by mouth every 8 (eight) hours as needed for pain.   Carboxymethylcellulose Sodium (THERATEARS OP) Place 1 drop into both eyes daily as needed (dry eyes).   clotrimazole-betamethasone (LOTRISONE) cream Apply 1 application topically 2 (two) times daily. (Patient taking  differently: Apply 1 application topically 2 (two) times daily as needed (rash).)   docusate sodium (COLACE) 100 MG capsule Take 1 capsule (100 mg total) by mouth 2 (two) times daily. (Patient taking differently: Take 200 mg by mouth daily as needed.)   etodolac (LODINE) 500 MG tablet TAKE 1 TABLET BY MOUTH TWICE A DAY   levothyroxine (SYNTHROID) 112 MCG tablet TAKE 1 TABLET (112 MCG TOTAL) BY MOUTH DAILY BEFORE BREAKFAST.   lovastatin (MEVACOR) 40 MG tablet TAKE 1 TABLET BY MOUTH EVERYDAY AT BEDTIME   metoprolol succinate (TOPROL-XL) 100 MG 24 hr tablet TAKE 1 TABLET BY MOUTH DAILY. TAKE WITH OR IMMEDIATELY FOLLOWING A MEAL.   NON FORMULARY CPAP   omeprazole (PRILOSEC) 20 MG capsule Take 1 capsule (20 mg total) by mouth daily.   Piroxicam (FELDENE PO) Take 1 tablet by mouth daily at 6 (six) AM.   Polyethylene Glycol 3350 (MIRALAX PO) Take by mouth as needed.   rOPINIRole (REQUIP) 1 MG tablet TAKE 1 TABLET BY MOUTH EVERYDAY AT BEDTIME   traZODone (DESYREL) 50 MG tablet TAKE 2 TABLETS BY MOUTH EVERY DAY   venlafaxine XR (EFFEXOR-XR) 75 MG 24 hr capsule TAKE 2 CAPSULES (150 MG TOTAL) BY MOUTH DAILY WITH BREAKFAST.   Vitamin D, Ergocalciferol, (DRISDOL) 1.25 MG (50000 UNIT) CAPS capsule TAKE 1 CAPSULE (50,000 UNITS TOTAL) BY MOUTH EVERY 7 (SEVEN) DAYS   Vitamin D3 (VITAMIN D) 25 MCG tablet Take 1,000 Units by mouth daily.   Calcium Polycarbophil (FIBER-CAPS PO) Take 2 capsules by mouth daily. (Patient not taking: No sig reported)   No facility-administered medications prior to visit.    Review of Systems  Constitutional: Negative for appetite change and fatigue.  Respiratory: Negative for cough and shortness of breath.   Cardiovascular: Negative for chest pain, palpitations and leg swelling.  Musculoskeletal: Negative for arthralgias and myalgias.  Neurological: Negative for dizziness, light-headedness and headaches.  Psychiatric/Behavioral: Negative for agitation, self-injury, sleep  disturbance and suicidal ideas. The patient is not nervous/anxious.        Objective    BP (!) 170/76   Pulse 67   Resp 16   Ht 5\' 5"  (1.651 m)   Wt 242 lb (109.8 kg)   BMI 40.27 kg/m  BP Readings from Last 3 Encounters:  08/20/20 (!) 170/76  12/27/19 (!) 159/92  11/16/19 (!) 147/90   Wt Readings from Last 3 Encounters:  08/20/20 242 lb (109.8 kg)  11/16/19 244 lb (110.7 kg)  10/20/19 (!) 240 lb (108.9 kg)     Physical Exam Constitutional:      General: She is not in acute distress.    Appearance: She is well-developed.  HENT:     Head: Normocephalic and atraumatic.     Right  Ear: Hearing normal.     Left Ear: Hearing normal.     Nose: Nose normal.  Eyes:     General: Lids are normal. No scleral icterus.       Right eye: No discharge.        Left eye: No discharge.     Conjunctiva/sclera: Conjunctivae normal.  Cardiovascular:     Rate and Rhythm: Normal rate and regular rhythm.     Heart sounds: Normal heart sounds.  Pulmonary:     Effort: Pulmonary effort is normal. No respiratory distress.     Breath sounds: Normal breath sounds.  Abdominal:     General: Bowel sounds are normal.     Palpations: Abdomen is soft.  Musculoskeletal:     Cervical back: Normal range of motion and neck supple.     Comments: Some crepitus of the left knee. History of right knee replacement in July 2021 and the left October 2021.  Skin:    Findings: No lesion or rash.  Neurological:     Mental Status: She is alert and oriented to person, place, and time.  Psychiatric:        Speech: Speech normal.        Behavior: Behavior normal.        Thought Content: Thought content normal.     No results found for any visits on 08/20/20.  Assessment & Plan     1. Essential hypertension BP elevated with morbid obesity. Tolerating Toprol-XL 100 mg qd without significant side effects. Recheck labs and plan follow up pending reports. - CBC with Differential/Platelet - Comprehensive  metabolic panel - Lipid panel - TSH  2. Hypothyroidism, unspecified type Tolerating the Levothyroxine 112 mcg qd. No palpitations, exophthalmos, tremor, or brittle nails. Morbid obesity and need to check labs. - CBC with Differential/Platelet - Comprehensive metabolic panel - TSH - T4  3. BMI 40.0-44.9, adult (HCC) BMI over 40. Need to lose weight to help with decreasing arthrtiic joint pains. Check labs for metabolic disorder. - CBC with Differential/Platelet - Comprehensive metabolic panel - Lipid panel - TSH - Hemoglobin A1c  4. Osteoarthritis of multiple joints, unspecified osteoarthritis type History of lumbar spondylosis and bilateral knee arthritis. Has had right knee replacement 09-26-19 and left knee 01-02-20. Having more discomfort. Will check for infection with CBC. No significant erythema or swelling. - CBC with Differential/Platelet   No follow-ups on file.      I, Krystyne Tewksbury, PA-C, have reviewed all documentation for this visit. The documentation on 08/20/20 for the exam, diagnosis, procedures, and orders are all accurate and complete.    Vernie Murders, PA-C  [employer] [phone] (phone) [phone] (fax)  [place]

## 2020-08-21 DIAGNOSIS — I10 Essential (primary) hypertension: Secondary | ICD-10-CM | POA: Diagnosis not present

## 2020-08-21 DIAGNOSIS — M25562 Pain in left knee: Secondary | ICD-10-CM | POA: Diagnosis not present

## 2020-08-21 DIAGNOSIS — Z6841 Body Mass Index (BMI) 40.0 and over, adult: Secondary | ICD-10-CM | POA: Diagnosis not present

## 2020-08-21 DIAGNOSIS — M25561 Pain in right knee: Secondary | ICD-10-CM | POA: Diagnosis not present

## 2020-08-21 DIAGNOSIS — E039 Hypothyroidism, unspecified: Secondary | ICD-10-CM | POA: Diagnosis not present

## 2020-08-21 DIAGNOSIS — M159 Polyosteoarthritis, unspecified: Secondary | ICD-10-CM | POA: Diagnosis not present

## 2020-08-21 DIAGNOSIS — M47817 Spondylosis without myelopathy or radiculopathy, lumbosacral region: Secondary | ICD-10-CM | POA: Diagnosis not present

## 2020-08-22 ENCOUNTER — Telehealth: Payer: Self-pay

## 2020-08-22 LAB — CBC WITH DIFFERENTIAL/PLATELET
Basophils Absolute: 0 10*3/uL (ref 0.0–0.2)
Basos: 1 %
EOS (ABSOLUTE): 0.2 10*3/uL (ref 0.0–0.4)
Eos: 2 %
Hematocrit: 43.9 % (ref 34.0–46.6)
Hemoglobin: 14.9 g/dL (ref 11.1–15.9)
Immature Grans (Abs): 0 10*3/uL (ref 0.0–0.1)
Immature Granulocytes: 0 %
Lymphocytes Absolute: 1.3 10*3/uL (ref 0.7–3.1)
Lymphs: 18 %
MCH: 33.5 pg — ABNORMAL HIGH (ref 26.6–33.0)
MCHC: 33.9 g/dL (ref 31.5–35.7)
MCV: 99 fL — ABNORMAL HIGH (ref 79–97)
Monocytes Absolute: 0.7 10*3/uL (ref 0.1–0.9)
Monocytes: 10 %
Neutrophils Absolute: 4.8 10*3/uL (ref 1.4–7.0)
Neutrophils: 69 %
Platelets: 221 10*3/uL (ref 150–450)
RBC: 4.45 x10E6/uL (ref 3.77–5.28)
RDW: 13.7 % (ref 11.7–15.4)
WBC: 7 10*3/uL (ref 3.4–10.8)

## 2020-08-22 LAB — COMPREHENSIVE METABOLIC PANEL
ALT: 17 IU/L (ref 0–32)
AST: 16 IU/L (ref 0–40)
Albumin/Globulin Ratio: 1.6 (ref 1.2–2.2)
Albumin: 4.3 g/dL (ref 3.7–4.7)
Alkaline Phosphatase: 137 IU/L — ABNORMAL HIGH (ref 44–121)
BUN/Creatinine Ratio: 23 (ref 12–28)
BUN: 18 mg/dL (ref 8–27)
Bilirubin Total: 0.4 mg/dL (ref 0.0–1.2)
CO2: 26 mmol/L (ref 20–29)
Calcium: 9.7 mg/dL (ref 8.7–10.3)
Chloride: 101 mmol/L (ref 96–106)
Creatinine, Ser: 0.79 mg/dL (ref 0.57–1.00)
Globulin, Total: 2.7 g/dL (ref 1.5–4.5)
Glucose: 85 mg/dL (ref 65–99)
Potassium: 4.5 mmol/L (ref 3.5–5.2)
Sodium: 140 mmol/L (ref 134–144)
Total Protein: 7 g/dL (ref 6.0–8.5)
eGFR: 78 mL/min/{1.73_m2} (ref >59)

## 2020-08-22 LAB — LIPID PANEL
Chol/HDL Ratio: 3.6 ratio (ref 0.0–4.4)
Cholesterol, Total: 181 mg/dL (ref 100–199)
HDL: 50 mg/dL (ref >39)
LDL Chol Calc (NIH): 109 mg/dL — ABNORMAL HIGH (ref 0–99)
Triglycerides: 125 mg/dL (ref 0–149)
VLDL Cholesterol Cal: 22 mg/dL (ref 5–40)

## 2020-08-22 LAB — TSH: TSH: 3.35 u[IU]/mL (ref 0.450–4.500)

## 2020-08-22 LAB — HEMOGLOBIN A1C
Est. average glucose Bld gHb Est-mCnc: 108 mg/dL
Hgb A1c MFr Bld: 5.4 % (ref 4.8–5.6)

## 2020-08-22 LAB — T4: T4, Total: 9.5 ug/dL (ref 4.5–12.0)

## 2020-08-22 NOTE — Telephone Encounter (Signed)
Copied from [place] [phone]. Topic: General - Inquiry >> Aug 22, 2020 10:09 AM Greggory Keen D wrote: Reason for CRM: Pt called saying she looked at her labs and has a few questions  CB#  [phone]

## 2020-08-22 NOTE — Telephone Encounter (Signed)
LMTCB 08/22/2020.  PEC please find out what questions she has when she calls back.   Thanks,   -Mickel Baas

## 2020-08-23 ENCOUNTER — Encounter: Payer: Self-pay | Admitting: Family Medicine

## 2020-08-23 NOTE — Telephone Encounter (Signed)
Spoke with patient on phone who viewed results on mychart and had concerns that her thyroid level is not where it needs to be, patient reports that she has been fatigued. . Patient also had concerns of abnormal flags on her CMP and CBC. I advised patient that you have not viewed results yet but I will forward to review, patient is asking that if she does not get a call back today that you reply back to her labs through my chart message. KW

## 2020-08-26 ENCOUNTER — Telehealth: Payer: Self-pay

## 2020-08-26 NOTE — Telephone Encounter (Signed)
Copied from [place] [phone]. Topic: General - Inquiry >> Aug 22, 2020 10:09 AM Greggory Keen D wrote: Reason for CRM: Pt called saying she looked at her labs and has a few questions  CB#  [phone] >> Aug 26, 2020 10:28 AM Leward Quan A wrote: Patient called in a little upset that she did not receive a call from [facility] or his nurse about her lab results. Please call when possible Ph# [phone]

## 2020-08-26 NOTE — Telephone Encounter (Signed)
Patient called in again asking for Robin Espinoza or Robin Espinoza to call her on 08/27/20 she was informed about the my chart message sent by Robin Huh but did not go through to her since its unsigned. Please advise

## 2020-08-26 NOTE — Telephone Encounter (Signed)
Pt called again this morning asking for a call back regarding her thyroid labs. She said she has been calling since last week  CB#  [phone]

## 2020-08-26 NOTE — Telephone Encounter (Signed)
Attempted to contact patient voicemail box is full, if patient returns call [facility] triage please deliver this message below to patient. KW (Newest Message First)  Chr[place]on, Vickki Muff, PA-C to [place], Prakriti Carignan      08/23/20 6:37 PM Thyroid tests are normal and don't indicate need for Levothyroxine dosage change. Minor variations of MCV and MCH should be no problem at all. LDL cholesterol is slightly above the goal of <100. Continue present dosage of Lovastatin. Elevation of alkaline phosphatase can be see with use of Trazodone, Effexor and Lovastatin together. No other liver or kidney changes to indicate problems. Should recheck these levels in 3-4 months.  This MyChart message has not been read.

## 2020-08-26 NOTE — Telephone Encounter (Signed)
Copied from [place] [phone]. Topic: General - Inquiry >> Aug 22, 2020 10:09 AM Greggory Keen D wrote: Reason for CRM: Pt called saying she looked at her labs and has a few questions  CB#  [phone] >> Aug 26, 2020 10:28 AM Leward Quan A wrote: Patient called in a little upset that she did not receive a call from [facility] or his nurse about her lab results. Please call when possible Ph# [phone]

## 2020-08-27 NOTE — Telephone Encounter (Signed)
Mailbox full, unable to leave message

## 2020-08-27 NOTE — Telephone Encounter (Signed)
Attempted to reach patient- with PCP response- no answer and  mailbox full- unable to leave message.

## 2020-08-28 NOTE — Telephone Encounter (Signed)
Attempted to call patient- no answer- mailbox is full- unable to leave message

## 2020-08-29 DIAGNOSIS — L570 Actinic keratosis: Secondary | ICD-10-CM | POA: Diagnosis not present

## 2020-08-29 DIAGNOSIS — D2272 Melanocytic nevi of left lower limb, including hip: Secondary | ICD-10-CM | POA: Diagnosis not present

## 2020-08-29 DIAGNOSIS — D225 Melanocytic nevi of trunk: Secondary | ICD-10-CM | POA: Diagnosis not present

## 2020-08-29 DIAGNOSIS — L720 Epidermal cyst: Secondary | ICD-10-CM | POA: Diagnosis not present

## 2020-08-29 DIAGNOSIS — D2261 Melanocytic nevi of right upper limb, including shoulder: Secondary | ICD-10-CM | POA: Diagnosis not present

## 2020-08-29 DIAGNOSIS — D2262 Melanocytic nevi of left upper limb, including shoulder: Secondary | ICD-10-CM | POA: Diagnosis not present

## 2020-08-29 DIAGNOSIS — D2271 Melanocytic nevi of right lower limb, including hip: Secondary | ICD-10-CM | POA: Diagnosis not present

## 2020-08-29 DIAGNOSIS — L821 Other seborrheic keratosis: Secondary | ICD-10-CM | POA: Diagnosis not present

## 2020-08-29 NOTE — Telephone Encounter (Signed)
Patient has reviewed lab and comment on her mychart 08/27/20. Patient is now scheduled to be seen at [facility] with Laverna Peace, NP.

## 2020-09-14 ENCOUNTER — Other Ambulatory Visit: Payer: Self-pay | Admitting: Family Medicine

## 2020-09-14 DIAGNOSIS — I10 Essential (primary) hypertension: Secondary | ICD-10-CM

## 2020-09-14 NOTE — Telephone Encounter (Signed)
Requested medication (s) are due for refill today: yes  Requested medication (s) are on the active medication list: yes  Last refill:  08/05/20 #30  Future visit scheduled: yes  Notes to clinic:  pt has already been given 2  courtesy refills- upcoming appt is 11/19/20   Requested Prescriptions  Pending Prescriptions Disp Refills   metoprolol succinate (TOPROL-XL) 100 MG 24 hr tablet [Pharmacy Med Name: METOPROLOL SUCC ER 100 MG TAB] 30 tablet 0    Sig: TAKE 1 TABLET BY MOUTH DAILY. TAKE WITH OR IMMEDIATELY FOLLOWING A MEAL.      Cardiovascular:  Beta Blockers Failed - 09/14/2020  2:31 PM      Failed - Last BP in normal range    BP Readings from Last 1 Encounters:  08/20/20 (!) 170/76          Passed - Last Heart Rate in normal range    Pulse Readings from Last 1 Encounters:  08/20/20 67          Passed - Valid encounter within last 6 months    Recent Outpatient Visits           3 weeks ago Essential hypertension   [place], PA-C   8 months ago Bilateral impacted cerumen   [facility], [place], [place]   10 months ago Hair loss   [place], [place]   11 months ago Status post right knee replacement   [facility] [place], [place], [place]   1 year ago Pre-op exam   [facility] [place], [place], [place]       Future Appointments             In 2 months [place], Kelby Aline, [place], [facility]

## 2020-09-16 DIAGNOSIS — M5416 Radiculopathy, lumbar region: Secondary | ICD-10-CM | POA: Diagnosis not present

## 2020-09-16 DIAGNOSIS — Z96651 Presence of right artificial knee joint: Secondary | ICD-10-CM | POA: Diagnosis not present

## 2020-09-26 DIAGNOSIS — M25562 Pain in left knee: Secondary | ICD-10-CM | POA: Diagnosis not present

## 2020-09-26 DIAGNOSIS — M25561 Pain in right knee: Secondary | ICD-10-CM | POA: Diagnosis not present

## 2020-09-26 DIAGNOSIS — M47817 Spondylosis without myelopathy or radiculopathy, lumbosacral region: Secondary | ICD-10-CM | POA: Diagnosis not present

## 2020-10-02 DIAGNOSIS — M25561 Pain in right knee: Secondary | ICD-10-CM | POA: Diagnosis not present

## 2020-10-02 DIAGNOSIS — M47817 Spondylosis without myelopathy or radiculopathy, lumbosacral region: Secondary | ICD-10-CM | POA: Diagnosis not present

## 2020-10-02 DIAGNOSIS — M25562 Pain in left knee: Secondary | ICD-10-CM | POA: Diagnosis not present

## 2020-10-08 ENCOUNTER — Telehealth: Payer: Self-pay

## 2020-10-08 NOTE — Telephone Encounter (Signed)
Copied from [place] [phone]. Topic: Medical Record Request - Other >> Oct 08, 2020  9:41 AM Greggory Keen D wrote:  Requestor Name/Agency: [employer]  Call Back #: [phone]  Information Requested: Medical records   he was told the office had rec the request and was in process.   Route to [employer] for [employer]. For all other clinics, route to the clinic's PEC Pool.

## 2020-10-08 NOTE — Telephone Encounter (Signed)
Copied from [place] [phone]. Topic: Medical Record Request - Other >> Oct 08, 2020  9:41 AM Greggory Keen D wrote:  Requestor Name/Agency: [employer]  Call Back #: [phone]  Information Requested: Medical records   he was told the office had rec the request and was in process.   Route to [employer] for [employer]. For all other clinics, route to the clinic's PEC Pool.

## 2020-10-08 NOTE — Telephone Encounter (Signed)
Copied from [place] [phone]. Topic: Medical Record Request - Other >> Oct 08, 2020  9:41 AM Greggory Keen D wrote:  Requestor Name/Agency: [employer]  Call Back #: [phone]  Information Requested: Medical records   he was told the office had rec the request and was in process.   Route to [employer] for [employer]. For all other clinics, route to the clinic's PEC Pool.

## 2020-10-10 NOTE — Telephone Encounter (Signed)
Unable to reach agency. [employer] picked up request from our office on 10/01/20.

## 2020-10-10 NOTE — Telephone Encounter (Signed)
Please see related encounter.

## 2020-10-10 NOTE — Telephone Encounter (Signed)
Spoke with Gerald Stabs advised [employer] picked up ROI on 10/01/20 and provided Gerald Stabs with our [employer] Rep's phone number.

## 2020-10-17 DIAGNOSIS — M3501 Sicca syndrome with keratoconjunctivitis: Secondary | ICD-10-CM | POA: Diagnosis not present

## 2020-10-28 ENCOUNTER — Ambulatory Visit: Payer: Self-pay | Admitting: *Deleted

## 2020-10-28 DIAGNOSIS — Z96651 Presence of right artificial knee joint: Secondary | ICD-10-CM | POA: Diagnosis not present

## 2020-10-28 NOTE — Telephone Encounter (Signed)
Patient requesting her latest B/P . Left VM with information requested.

## 2020-11-01 DIAGNOSIS — M545 Low back pain, unspecified: Secondary | ICD-10-CM | POA: Diagnosis not present

## 2020-11-12 DIAGNOSIS — M545 Low back pain, unspecified: Secondary | ICD-10-CM | POA: Diagnosis not present

## 2020-11-13 ENCOUNTER — Telehealth: Payer: Self-pay | Admitting: Family Medicine

## 2020-11-13 ENCOUNTER — Other Ambulatory Visit: Payer: Self-pay | Admitting: Family Medicine

## 2020-11-13 DIAGNOSIS — M159 Polyosteoarthritis, unspecified: Secondary | ICD-10-CM

## 2020-11-13 MED ORDER — MELOXICAM 15 MG PO TABS: 15.0000 mg | ORAL_TABLET | Freq: Every day | ORAL | 2 refills | Status: DC

## 2020-11-13 NOTE — Telephone Encounter (Signed)
Copied from [place] [phone]. Topic: Quick Communication - Rx Refill/Question >> Nov 13, 2020 12:46 PM Ranim, Hertling A wrote: Medication: meloxicam (MOBIC) 15 MG tablet  Last filled per medication list 05/28/2017. Please advise   Has the patient contacted their pharmacy? Yes.   (Agent: If no, request that the patient contact the pharmacy for the refill.) (Agent: If yes, when and what did the pharmacy advise?)  Preferred Pharmacy (with phone number or street name): [id number] [address] Phone:  [phone]:  [phone]    Agent: Please be advised that RX refills may take up to 3 business days. We ask that you follow-up with your pharmacy.

## 2020-11-19 ENCOUNTER — Ambulatory Visit: Payer: PPO | Admitting: Adult Health

## 2020-11-24 ENCOUNTER — Other Ambulatory Visit: Payer: Self-pay | Admitting: Physician Assistant

## 2020-11-24 DIAGNOSIS — F5101 Primary insomnia: Secondary | ICD-10-CM

## 2020-12-06 ENCOUNTER — Other Ambulatory Visit: Payer: Self-pay | Admitting: Family Medicine

## 2020-12-06 DIAGNOSIS — M545 Low back pain, unspecified: Secondary | ICD-10-CM | POA: Diagnosis not present

## 2020-12-13 ENCOUNTER — Other Ambulatory Visit: Payer: Self-pay | Admitting: Physician Assistant

## 2020-12-13 DIAGNOSIS — F32A Depression, unspecified: Secondary | ICD-10-CM

## 2020-12-13 DIAGNOSIS — E039 Hypothyroidism, unspecified: Secondary | ICD-10-CM

## 2020-12-13 DIAGNOSIS — K219 Gastro-esophageal reflux disease without esophagitis: Secondary | ICD-10-CM

## 2020-12-18 ENCOUNTER — Other Ambulatory Visit: Payer: Self-pay | Admitting: Family Medicine

## 2020-12-18 DIAGNOSIS — G2581 Restless legs syndrome: Secondary | ICD-10-CM

## 2021-01-13 ENCOUNTER — Telehealth: Payer: Self-pay | Admitting: Family Medicine

## 2021-01-13 NOTE — Telephone Encounter (Signed)
[facility] ref[facility] request for the following medications:    metoprolol succinate (TOPROL-XL) 100 MG 24 hr tablet  Please advise.

## 2021-01-14 ENCOUNTER — Other Ambulatory Visit: Payer: Self-pay

## 2021-01-14 ENCOUNTER — Ambulatory Visit: Payer: PPO | Admitting: Adult Health

## 2021-01-14 DIAGNOSIS — I10 Essential (primary) hypertension: Secondary | ICD-10-CM

## 2021-01-14 MED ORDER — METOPROLOL SUCCINATE ER 100 MG PO TB24: 100.0000 mg | ORAL_TABLET | Freq: Every day | ORAL | 3 refills | Status: DC

## 2021-01-17 ENCOUNTER — Telehealth: Payer: Self-pay | Admitting: Family Medicine

## 2021-01-17 DIAGNOSIS — G4733 Obstructive sleep apnea (adult) (pediatric): Secondary | ICD-10-CM | POA: Diagnosis not present

## 2021-01-17 NOTE — Telephone Encounter (Signed)
Ok. To schedule.

## 2021-01-17 NOTE — Telephone Encounter (Signed)
Patient called in stating that her sister Maylon Peppers who is currently a patient of Dr.Scott's said that Dr.Scott would see her as a new patient.Please advise.

## 2021-01-24 NOTE — Telephone Encounter (Signed)
New patient appt scheduled.

## 2021-01-27 DIAGNOSIS — G8310 Monoplegia of lower limb affecting unspecified side: Secondary | ICD-10-CM | POA: Diagnosis not present

## 2021-01-27 DIAGNOSIS — M545 Low back pain, unspecified: Secondary | ICD-10-CM | POA: Diagnosis not present

## 2021-01-27 DIAGNOSIS — M47817 Spondylosis without myelopathy or radiculopathy, lumbosacral region: Secondary | ICD-10-CM | POA: Diagnosis not present

## 2021-01-30 ENCOUNTER — Ambulatory Visit (INDEPENDENT_AMBULATORY_CARE_PROVIDER_SITE_OTHER): Payer: PPO | Admitting: Family Medicine

## 2021-01-30 ENCOUNTER — Other Ambulatory Visit: Payer: Self-pay

## 2021-01-30 ENCOUNTER — Encounter: Payer: Self-pay | Admitting: Family Medicine

## 2021-01-30 VITALS — BP 139/84 | HR 67 | Resp 16

## 2021-01-30 DIAGNOSIS — R531 Weakness: Secondary | ICD-10-CM | POA: Insufficient documentation

## 2021-01-30 DIAGNOSIS — E559 Vitamin D deficiency, unspecified: Secondary | ICD-10-CM

## 2021-01-30 DIAGNOSIS — K5904 Chronic idiopathic constipation: Secondary | ICD-10-CM

## 2021-01-30 DIAGNOSIS — Z23 Encounter for immunization: Secondary | ICD-10-CM

## 2021-01-30 DIAGNOSIS — R5382 Chronic fatigue, unspecified: Secondary | ICD-10-CM | POA: Insufficient documentation

## 2021-01-30 DIAGNOSIS — F339 Major depressive disorder, recurrent, unspecified: Secondary | ICD-10-CM

## 2021-01-30 DIAGNOSIS — R7303 Prediabetes: Secondary | ICD-10-CM | POA: Insufficient documentation

## 2021-01-30 MED ORDER — FLUOXETINE HCL 20 MG PO TABS: 20.0000 mg | ORAL_TABLET | Freq: Every day | ORAL | 3 refills | Status: DC

## 2021-01-30 MED ORDER — POLYETHYLENE GLYCOL 3350 17 GM/SCOOP PO POWD: 2.0000 | Freq: Every day | ORAL | 3 refills | Status: DC

## 2021-01-30 MED ORDER — ZOSTER VAC RECOMB ADJUVANTED 50 MCG/0.5ML IM SUSR
0.5000 mL | Freq: Once | INTRAMUSCULAR | 0 refills | Status: AC
Start: 2021-01-30 — End: 2021-01-30

## 2021-01-30 MED ORDER — SENNOSIDES 8.6 MG PO TABS: 2.0000 | ORAL_TABLET | Freq: Every evening | ORAL | 3 refills | Status: DC

## 2021-01-30 MED ORDER — LACTULOSE 10 GM/15ML PO SOLN: 30.0000 g | Freq: Every day | ORAL | 11 refills | Status: DC | PRN

## 2021-01-30 NOTE — Assessment & Plan Note (Signed)
-  htn -predm -depression Discussed importance of healthy weight management Discussed diet and exercise

## 2021-01-30 NOTE — Assessment & Plan Note (Signed)
Repeat a1c

## 2021-01-30 NOTE — Assessment & Plan Note (Signed)
Aware of possible fee; repeat labs

## 2021-01-30 NOTE — Assessment & Plan Note (Signed)
Reviewed daily preventive meds Reviewed meds as needed

## 2021-01-30 NOTE — Progress Notes (Signed)
Established patient visit   Patient: Robin Espinoza   DOB: 03-29-1945   75 y.o. Female  MRN: [id number] Visit Date: 01/30/2021  Today's healthcare provider: Gwyneth Sprout, FNP   Chief Complaint  Patient presents with   Hypertension   Hypothyroidism   Subjective    HPI  Hypertension, follow-up  BP Readings from Last 3 Encounters:  01/30/21 139/84  08/20/20 (!) 170/76  12/27/19 (!) 159/92   Wt Readings from Last 3 Encounters:  08/20/20 242 lb (109.8 kg)  11/16/19 244 lb (110.7 kg)  10/20/19 (!) 240 lb (108.9 kg)     She was last seen for hypertension 6 months ago.  BP at that visit was 170/76. Management since that visit includes none.  She reports excellent compliance with treatment. She is not having side effects.  She is following a Regular diet. She is not exercising. She does not smoke.  Use of agents associated with hypertension: none.   Outside blood pressures are not checked. Symptoms: No chest pain No chest pressure  No palpitations No syncope  No dyspnea No orthopnea  No paroxysmal nocturnal dyspnea Yes lower extremity edema   Pertinent labs: Lab Results  Component Value Date   CHOL 181 08/21/2020   HDL 50 08/21/2020   LDLCALC 109 (H) 08/21/2020   TRIG 125 08/21/2020   CHOLHDL 3.6 08/21/2020   Lab Results  Component Value Date   NA 140 08/21/2020   K 4.5 08/21/2020   CREATININE 0.79 08/21/2020   EGFR 78 08/21/2020   GLUCOSE 85 08/21/2020   TSH 3.350 08/21/2020     The 10-year ASCVD risk score (Arnett DK, et al., 2019) is: 24.1%   ---------------------------------------------------------------------------------------------------  Hypothyroid, follow-up  Lab Results  Component Value Date   TSH 3.350 08/21/2020   TSH 1.350 11/16/2019   TSH 3.100 04/07/2019   T4TOTAL 9.5 08/21/2020   T4TOTAL 8.7 02/06/2016   Wt Readings from Last 3 Encounters:  08/20/20 242 lb (109.8 kg)  11/16/19 244 lb (110.7 kg)  10/20/19 (!) 240 lb (108.9  kg)    She was last seen for hypothyroid 6 months ago.  Management since that visit includes none. She reports excellent compliance with treatment. She is not having side effects.   Symptoms: No change in energy level Yes constipation  No diarrhea No heat / cold intolerance  Yes nervousness No palpitations  No weight changes    -----------------------------------------------------------------------------------------     Medications: Outpatient Medications Prior to Visit  Medication Sig   acetaminophen (TYLENOL) 650 MG CR tablet Take 1,300 mg by mouth every 8 (eight) hours as needed for pain.   Carboxymethylcellulose Sodium (THERATEARS OP) Place 1 drop into both eyes daily as needed (dry eyes).   levothyroxine (SYNTHROID) 112 MCG tablet TAKE 1 TABLET BY MOUTH DAILY BEFORE BREAKFAST.   lovastatin (MEVACOR) 40 MG tablet TAKE 1 TABLET BY MOUTH EVERYDAY AT BEDTIME   meloxicam (MOBIC) 15 MG tablet Take 1 tablet (15 mg total) by mouth daily.   metoprolol succinate (TOPROL-XL) 100 MG 24 hr tablet Take 1 tablet (100 mg total) by mouth daily. TAKE WITH OR IMMEDIATELY FOLLOWING A MEAL.   NON FORMULARY CPAP   omeprazole (PRILOSEC) 20 MG capsule TAKE 1 CAPSULE BY MOUTH EVERY DAY   rOPINIRole (REQUIP) 1 MG tablet TAKE 1 TABLET BY MOUTH EVERYDAY AT BEDTIME   traMADol (ULTRAM) 50 MG tablet Take 50 mg by mouth 2 (two) times daily as needed.   traZODone (DESYREL) 50 MG  tablet TAKE 2 TABLETS BY MOUTH EVERY DAY   Vitamin D, Ergocalciferol, (DRISDOL) 1.25 MG (50000 UNIT) CAPS capsule TAKE 1 CAPSULE (50,000 UNITS TOTAL) BY MOUTH EVERY 7 (SEVEN) DAYS   Vitamin D3 (VITAMIN D) 25 MCG tablet Take 1,000 Units by mouth daily.   [DISCONTINUED] clotrimazole-betamethasone (LOTRISONE) cream Apply 1 application topically 2 (two) times daily. (Patient taking differently: Apply 1 application topically 2 (two) times daily as needed (rash).)   [DISCONTINUED] docusate sodium (COLACE) 100 MG capsule Take 1 capsule (100  mg total) by mouth 2 (two) times daily. (Patient taking differently: Take 200 mg by mouth daily as needed.)   [DISCONTINUED] Polyethylene Glycol 3350 (MIRALAX PO) Take by mouth as needed.   [DISCONTINUED] venlafaxine XR (EFFEXOR-XR) 75 MG 24 hr capsule TAKE 2 CAPSULES (150 MG TOTAL) BY MOUTH DAILY WITH BREAKFAST.   [DISCONTINUED] Calcium Polycarbophil (FIBER-CAPS PO) Take 2 capsules by mouth daily. (Patient not taking: No sig reported)   No facility-administered medications prior to visit.    Review of Systems     Objective    BP 139/84   Pulse 67   Resp 16   SpO2 100%    Physical Exam Vitals and nursing note reviewed.  Constitutional:      General: She is not in acute distress.    Appearance: Normal appearance. She is obese. She is not ill-appearing, toxic-appearing or diaphoretic.  HENT:     Head: Normocephalic and atraumatic.  Cardiovascular:     Rate and Rhythm: Normal rate and regular rhythm.     Pulses: Normal pulses.     Heart sounds: Normal heart sounds. No murmur heard.   No friction rub. No gallop.  Pulmonary:     Effort: Pulmonary effort is normal. No respiratory distress.     Breath sounds: Normal breath sounds. No stridor. No wheezing, rhonchi or rales.  Chest:     Chest wall: No tenderness.  Abdominal:     General: Bowel sounds are normal.     Palpations: Abdomen is soft.  Musculoskeletal:        General: No swelling, tenderness, deformity or signs of injury. Normal range of motion.     Right lower leg: Edema present.     Left lower leg: Edema present.  Skin:    General: Skin is warm and dry.     Capillary Refill: Capillary refill takes less than 2 seconds.     Coloration: Skin is not jaundiced or pale.     Findings: No bruising, erythema, lesion or rash.  Neurological:     General: No focal deficit present.     Mental Status: She is alert and oriented to person, place, and time. Mental status is at baseline.     Cranial Nerves: No cranial nerve  deficit.     Sensory: No sensory deficit.     Motor: No weakness.     Coordination: Coordination normal.  Psychiatric:        Mood and Affect: Mood normal.        Behavior: Behavior normal.        Thought Content: Thought content normal.        Judgment: Judgment normal.     No results found for any visits on 01/30/21.  Assessment & Plan     Problem List Items Addressed This Visit       Digestive   Chronic idiopathic constipation - Primary    Reviewed daily preventive meds Reviewed meds as needed  Relevant Medications   polyethylene glycol powder (GLYCOLAX/MIRALAX) 17 GM/SCOOP powder   senna (SENOKOT) 8.6 MG tablet   lactulose (CHRONULAC) 10 GM/15ML solution     Other   Avitaminosis D    Aware of possible fee; repeat labs      Relevant Orders   Vitamin D (25 hydroxy)   Prediabetes    Repeat a1c      Relevant Orders   Hemoglobin A1c   Morbid obesity ([place])    -htn -predm -depression Discussed importance of healthy weight management Discussed diet and exercise       Chronic fatigue    General lab work check advised      Relevant Orders   T4, free   TSH   CBC with Differential/Platelet   Comprehensive metabolic panel   Depression, recurrent ([place])    -feeling "blue" Request to change meds      Relevant Medications   FLUoxetine (PROZAC) 20 MG tablet   Need for shingles vaccine    Rx provided        Return in about 3 months (around 05/02/2021) for anxiety and depression, T2DM management.      Vonna Kotyk, FNP, have reviewed all documentation for this visit. The documentation on 01/30/21 for the exam, diagnosis, procedures, and orders are all accurate and complete.    Gwyneth Sprout, [place] [phone] (phone) [phone] (fax)  [place]

## 2021-01-30 NOTE — Assessment & Plan Note (Signed)
General lab work check advised

## 2021-01-30 NOTE — Patient Instructions (Signed)
Take Prozac daily with Effexor Stop Effexor- on Thanksgiving This will allow the Prozac time to get into your system

## 2021-01-30 NOTE — Assessment & Plan Note (Signed)
-  feeling "blue" Request to change meds

## 2021-01-30 NOTE — Assessment & Plan Note (Signed)
Rx provided.

## 2021-01-31 LAB — COMPREHENSIVE METABOLIC PANEL
ALT: 17 IU/L (ref 0–32)
AST: 18 IU/L (ref 0–40)
Albumin/Globulin Ratio: 1.6 (ref 1.2–2.2)
Albumin: 4 g/dL (ref 3.7–4.7)
Alkaline Phosphatase: 134 IU/L — ABNORMAL HIGH (ref 44–121)
BUN/Creatinine Ratio: 28 (ref 12–28)
BUN: 19 mg/dL (ref 8–27)
Bilirubin Total: 0.3 mg/dL (ref 0.0–1.2)
CO2: 25 mmol/L (ref 20–29)
Calcium: 9.3 mg/dL (ref 8.7–10.3)
Chloride: 104 mmol/L (ref 96–106)
Creatinine, Ser: 0.68 mg/dL (ref 0.57–1.00)
Globulin, Total: 2.5 g/dL (ref 1.5–4.5)
Glucose: 90 mg/dL (ref 70–99)
Potassium: 4.4 mmol/L (ref 3.5–5.2)
Sodium: 142 mmol/L (ref 134–144)
Total Protein: 6.5 g/dL (ref 6.0–8.5)
eGFR: 91 mL/min/{1.73_m2} (ref >59)

## 2021-01-31 LAB — CBC WITH DIFFERENTIAL/PLATELET
Basophils Absolute: 0 10*3/uL (ref 0.0–0.2)
Basos: 1 %
EOS (ABSOLUTE): 0.3 10*3/uL (ref 0.0–0.4)
Eos: 3 %
Hematocrit: 43.9 % (ref 34.0–46.6)
Hemoglobin: 14.6 g/dL (ref 11.1–15.9)
Immature Grans (Abs): 0 10*3/uL (ref 0.0–0.1)
Immature Granulocytes: 0 %
Lymphocytes Absolute: 1.1 10*3/uL (ref 0.7–3.1)
Lymphs: 14 %
MCH: 31.9 pg (ref 26.6–33.0)
MCHC: 33.3 g/dL (ref 31.5–35.7)
MCV: 96 fL (ref 79–97)
Monocytes Absolute: 0.7 10*3/uL (ref 0.1–0.9)
Monocytes: 9 %
Neutrophils Absolute: 5.4 10*3/uL (ref 1.4–7.0)
Neutrophils: 73 %
Platelets: 201 10*3/uL (ref 150–450)
RBC: 4.58 x10E6/uL (ref 3.77–5.28)
RDW: 12.3 % (ref 11.7–15.4)
WBC: 7.4 10*3/uL (ref 3.4–10.8)

## 2021-01-31 LAB — T4, FREE: Free T4: 1.84 ng/dL — ABNORMAL HIGH (ref 0.82–1.77)

## 2021-01-31 LAB — HEMOGLOBIN A1C
Est. average glucose Bld gHb Est-mCnc: 111 mg/dL
Hgb A1c MFr Bld: 5.5 % (ref 4.8–5.6)

## 2021-01-31 LAB — TSH: TSH: 0.085 u[IU]/mL — ABNORMAL LOW (ref 0.450–4.500)

## 2021-01-31 LAB — VITAMIN D 25 HYDROXY (VIT D DEFICIENCY, FRACTURES): Vit D, 25-Hydroxy: 32.8 ng/mL (ref 30.0–100.0)

## 2021-01-31 NOTE — Progress Notes (Signed)
Your thyroid illustrates that we are over treating it; would recommend going down on medication.  This goes against your complaints of fatigue.  I would like to change medication if you are agreeable and plan to recheck in 3 months.  Prediabetes has improved and is stable.  Cell count and kidney/liver functions are normal.  Vitamin D is currently normal. Would recommend staying on OTC Vit D3 1000-2000 units daily to reduce bone loss/break.  If you'd prefer- we can stay on the weekly supplement instead.  Let us know your preferences.

## 2021-02-03 ENCOUNTER — Other Ambulatory Visit: Payer: Self-pay

## 2021-02-03 DIAGNOSIS — F339 Major depressive disorder, recurrent, unspecified: Secondary | ICD-10-CM

## 2021-02-03 NOTE — Telephone Encounter (Signed)
Copied from [place] [phone]. Topic: General - Other >> Feb 03, 2021  1:14 PM Yvette Rack wrote: Reason for CRM: Pt stated the Rx for FLUoxetine (PROZAC) 20 MG tablet is about $200 but the capsules cost $9. Pt requests that a new Rx for capsules be sent to her pharmacy.

## 2021-02-04 ENCOUNTER — Telehealth: Payer: Self-pay

## 2021-02-04 MED ORDER — FLUOXETINE HCL 20 MG PO CAPS: 20.0000 mg | ORAL_CAPSULE | Freq: Every day | ORAL | 1 refills | Status: DC

## 2021-02-04 NOTE — Telephone Encounter (Signed)
Copied from [place] [id number]. Topic: General - Other >> Feb 04, 2021  4:04 PM Pawlus, Brayton Layman A wrote: Reason for CRM: Pt stated she saw her latest lab results on MyChart but does not fully understand what they mean, pt requested a call to go over her lab results, please advise.

## 2021-02-05 NOTE — Telephone Encounter (Signed)
See result notes. 

## 2021-02-05 NOTE — Telephone Encounter (Signed)
LMTCB-Ok for [facility] nurse to give and go over labs with patient when she calls. Thanks.

## 2021-02-06 ENCOUNTER — Other Ambulatory Visit: Payer: Self-pay | Admitting: Family Medicine

## 2021-02-06 MED ORDER — LEVOTHYROXINE SODIUM 88 MCG PO TABS: 88.0000 ug | ORAL_TABLET | Freq: Every day | ORAL | 0 refills | Status: DC

## 2021-02-06 NOTE — Telephone Encounter (Signed)
[facility] has reviewed over results with patient see result note in chart. What dosage of Levothyroxine did you want sent in on this patient? KW

## 2021-02-10 ENCOUNTER — Telehealth: Payer: Self-pay | Admitting: Family Medicine

## 2021-02-10 NOTE — Telephone Encounter (Signed)
Please review last filled 11/13/20. KW

## 2021-02-10 NOTE — Telephone Encounter (Signed)
[facility] faxed refill request for the following medications:   meloxicam (MOBIC) 15 MG tablet   Please advise.

## 2021-02-11 ENCOUNTER — Other Ambulatory Visit: Payer: Self-pay

## 2021-02-11 ENCOUNTER — Encounter: Payer: Self-pay | Admitting: Physician Assistant

## 2021-02-11 ENCOUNTER — Ambulatory Visit (INDEPENDENT_AMBULATORY_CARE_PROVIDER_SITE_OTHER): Payer: PPO | Admitting: Physician Assistant

## 2021-02-11 ENCOUNTER — Other Ambulatory Visit: Payer: Self-pay | Admitting: Family Medicine

## 2021-02-11 DIAGNOSIS — H6123 Impacted cerumen, bilateral: Secondary | ICD-10-CM | POA: Diagnosis not present

## 2021-02-11 DIAGNOSIS — M159 Polyosteoarthritis, unspecified: Secondary | ICD-10-CM

## 2021-02-11 MED ORDER — MELOXICAM 15 MG PO TABS: 15.0000 mg | ORAL_TABLET | Freq: Every day | ORAL | 0 refills | Status: DC

## 2021-02-11 NOTE — Patient Instructions (Signed)
Debrox - ear wax softener

## 2021-02-11 NOTE — Progress Notes (Signed)
Established patient visit   Patient: Robin Espinoza   DOB: 09/30/1945   75 y.o. Female  MRN: [id number] Visit Date: 02/11/2021  Today's healthcare provider: Mikey Kirschner, PA-C   Cc. Decreased hearing bilaterally  Subjective    HPI  Ferrell is a 75 y/o female who presents today feeling as if her ears are stopped up.  Denies feeling any pressure in her ears or fluid behind her ears.  Denies any sinus congestion or pressure.  She states that she often has blocked up ears due to wax which decreases her hearing.  Denies any ear pain.  Medications: Outpatient Medications Prior to Visit  Medication Sig   acetaminophen (TYLENOL) 650 MG CR tablet Take 1,300 mg by mouth every 8 (eight) hours as needed for pain.   Carboxymethylcellulose Sodium (THERATEARS OP) Place 1 drop into both eyes daily as needed (dry eyes).   FLUoxetine (PROZAC) 20 MG capsule Take 1 capsule (20 mg total) by mouth daily.   lactulose (CHRONULAC) 10 GM/15ML solution Take 45 mLs (30 g total) by mouth daily as needed for mild constipation.   levothyroxine (SYNTHROID) 88 MCG tablet Take 1 tablet (88 mcg total) by mouth daily.   lovastatin (MEVACOR) 40 MG tablet TAKE 1 TABLET BY MOUTH EVERYDAY AT BEDTIME   meloxicam (MOBIC) 15 MG tablet Take 1 tablet (15 mg total) by mouth daily.   metoprolol succinate (TOPROL-XL) 100 MG 24 hr tablet Take 1 tablet (100 mg total) by mouth daily. TAKE WITH OR IMMEDIATELY FOLLOWING A MEAL.   NON FORMULARY CPAP   omeprazole (PRILOSEC) 20 MG capsule TAKE 1 CAPSULE BY MOUTH EVERY DAY   polyethylene glycol powder (GLYCOLAX/MIRALAX) 17 GM/SCOOP powder Take 510 g by mouth daily.   rOPINIRole (REQUIP) 1 MG tablet TAKE 1 TABLET BY MOUTH EVERYDAY AT BEDTIME   senna (SENOKOT) 8.6 MG tablet Take 2 tablets (17.2 mg total) by mouth at bedtime.   traMADol (ULTRAM) 50 MG tablet Take 50 mg by mouth 2 (two) times daily as needed.   traZODone (DESYREL) 50 MG tablet TAKE 2 TABLETS BY MOUTH EVERY DAY   Vitamin  D, Ergocalciferol, (DRISDOL) 1.25 MG (50000 UNIT) CAPS capsule TAKE 1 CAPSULE (50,000 UNITS TOTAL) BY MOUTH EVERY 7 (SEVEN) DAYS   Vitamin D3 (VITAMIN D) 25 MCG tablet Take 1,000 Units by mouth daily.   No facility-administered medications prior to visit.    Review of Systems  Constitutional:  Negative for fever.  HENT:  Positive for hearing loss. Negative for congestion, ear pain, postnasal drip, sinus pressure and sinus pain.    Last CBC Lab Results  Component Value Date   WBC 7.4 01/30/2021   HGB 14.6 01/30/2021   HCT 43.9 01/30/2021   MCV 96 01/30/2021   MCH 31.9 01/30/2021   RDW 12.3 01/30/2021   PLT 201 62/83/1517   Last metabolic panel Lab Results  Component Value Date   GLUCOSE 90 01/30/2021   NA 142 01/30/2021   K 4.4 01/30/2021   CL 104 01/30/2021   CO2 25 01/30/2021   BUN 19 01/30/2021   CREATININE 0.68 01/30/2021   EGFR 91 01/30/2021   CALCIUM 9.3 01/30/2021   PROT 6.5 01/30/2021   ALBUMIN 4.0 01/30/2021   LABGLOB 2.5 01/30/2021   AGRATIO 1.6 01/30/2021   BILITOT 0.3 01/30/2021   ALKPHOS 134 (H) 01/30/2021   AST 18 01/30/2021   ALT 17 01/30/2021   ANIONGAP 8 09/27/2019   Last lipids Lab Results  Component Value Date  CHOL 181 08/21/2020   HDL 50 08/21/2020   LDLCALC 109 (H) 08/21/2020   TRIG 125 08/21/2020   CHOLHDL 3.6 08/21/2020   Last hemoglobin A1c Lab Results  Component Value Date   HGBA1C 5.5 01/30/2021   Last thyroid functions Lab Results  Component Value Date   TSH 0.085 (L) 01/30/2021   T4TOTAL 9.5 08/21/2020   Last vitamin D Lab Results  Component Value Date   VD25OH 32.8 01/30/2021   Last vitamin B12 and Folate No results found for: VITAMINB12, FOLATE     Objective    BP (!) 163/99 (BP Location: Left Arm, Patient Position: Sitting, Cuff Size: Large)   Pulse 67   Temp 98 F (36.7 C) (Oral)   Ht 5' 5.5" (1.664 m)   SpO2 97%   BMI 39.66 kg/m  BP Readings from Last 3 Encounters:  02/11/21 (!) 163/99  01/30/21  139/84  08/20/20 (!) 170/76   Wt Readings from Last 3 Encounters:  08/20/20 242 lb (109.8 kg)  11/16/19 244 lb (110.7 kg)  10/20/19 (!) 240 lb (108.9 kg)      Physical Exam Constitutional:      Appearance: Normal appearance. She is obese.  HENT:     Head: Normocephalic.     Right Ear: There is impacted cerumen.     Left Ear: There is impacted cerumen.     Ears:     Comments: Post ear irrigation, Bilateral TM normal w/o erythema, injection, bulging.. no erythema in canal Cardiovascular:     Rate and Rhythm: Normal rate.  Pulmonary:     Effort: Pulmonary effort is normal.  Neurological:     Mental Status: She is alert.     No results found for any visits on 02/11/21.  Assessment & Plan     Bilateral cerumen impaction CMA irrigated bilateral ears today.  Exam post irrigation with normal tympanic membrane and normal canal.  Patient states she can hear much better after irrigation. I recommended she use Debrox earwax softening drops, 5 drops in each ear 1-2 times a week.  She can rinse her ears in the shower after this as well. Return if symptoms worsen or fail to improve.      I, Mikey Kirschner, PA-C have reviewed all documentation for this visit. The documentation on  02/11/2021 for the exam, diagnosis, procedures, and orders are all accurate and complete.    Mikey Kirschner, PA-C  [facility] [phone] (phone) [phone] (fax)  [place]

## 2021-02-21 DIAGNOSIS — M25561 Pain in right knee: Secondary | ICD-10-CM | POA: Diagnosis not present

## 2021-02-21 DIAGNOSIS — M545 Low back pain, unspecified: Secondary | ICD-10-CM | POA: Diagnosis not present

## 2021-02-24 ENCOUNTER — Other Ambulatory Visit: Payer: Self-pay | Admitting: Family Medicine

## 2021-02-24 DIAGNOSIS — M159 Polyosteoarthritis, unspecified: Secondary | ICD-10-CM

## 2021-02-24 DIAGNOSIS — G2581 Restless legs syndrome: Secondary | ICD-10-CM

## 2021-02-24 DIAGNOSIS — M545 Low back pain, unspecified: Secondary | ICD-10-CM | POA: Diagnosis not present

## 2021-02-24 DIAGNOSIS — M25561 Pain in right knee: Secondary | ICD-10-CM | POA: Diagnosis not present

## 2021-02-24 NOTE — Telephone Encounter (Signed)
Requested medication (s) are due for refill today: Mobic was signed one week ago, Levothyroxine was filled for 90 day supply 02/06/21  Requested medication (s) are on the active medication list: yes  Last refill:  02/06/21 for Levothyroxine  Future visit scheduled: 04/10/21  Notes to clinic:  please assess.   Requested Prescriptions  Pending Prescriptions Disp Refills   meloxicam (MOBIC) 15 MG tablet [Pharmacy Med Name: MELOXICAM 15 MG TABLET] 90 tablet 0    Sig: Take 1 tablet (15 mg total) by mouth daily.     Analgesics:  COX2 Inhibitors Passed - 02/24/2021 12:17 PM      Passed - HGB in normal range and within 360 days    Hemoglobin  Date Value Ref Range Status  01/30/2021 14.6 11.1 - 15.9 g/dL Final          Passed - Cr in normal range and within 360 days    Creatinine  Date Value Ref Range Status  05/03/2012 0.74 0.60 - 1.30 mg/dL Final   Creatinine, Ser  Date Value Ref Range Status  01/30/2021 0.68 0.57 - 1.00 mg/dL Final          Passed - Patient is not pregnant      Passed - Valid encounter within last 12 months    Recent Outpatient Visits           1 week ago Bilateral impacted cerumen   [facility] Mikey Kirschner, PA-C   3 weeks ago Chronic idiopathic constipation   [facility] Gwyneth Sprout, FNP   6 months ago Essential hypertension   [facility], Vickki Muff, PA-C   1 year ago Bilateral impacted cerumen   [facility] [place], [place], [place]   1 year ago Hair loss   [place], Clearnce Sorrel, [place]       Future Appointments             In 3 weeks Einar Pheasant, MD [facility], [facility]             levothyroxine (SYNTHROID) 88 MCG tablet [Pharmacy Med Name: LEVOTHYROXINE 88 MCG TABLET] 90 tablet 0    Sig: TAKE 1 TABLET BY MOUTH EVERY DAY     Endocrinology:  Hypothyroid Agents Failed - 02/24/2021 12:17 PM      Failed - TSH needs to be  rechecked within 3 months after an abnormal result. Refill until TSH is due.      Failed - TSH in normal range and within 360 days    TSH  Date Value Ref Range Status  01/30/2021 0.085 (L) 0.450 - 4.500 uIU/mL Final          Passed - Valid encounter within last 12 months    Recent Outpatient Visits           1 week ago Bilateral impacted cerumen   [facility] Thedore Mins, [place], PA-C   3 weeks ago Chronic idiopathic constipation   [facility] Gwyneth Sprout, FNP   6 months ago Essential hypertension   [facility], Vickki Muff, PA-C   1 year ago Bilateral impacted cerumen   [place], Clearnce Sorrel, [place]   1 year ago Hair loss   [facility], Clearnce Sorrel, [place]       Future Appointments             In 3 weeks Einar Pheasant, MD [facility],  [facility]

## 2021-02-24 NOTE — Telephone Encounter (Signed)
Requested Prescriptions  Pending Prescriptions Disp Refills  . rOPINIRole (REQUIP) 1 MG tablet [Pharmacy Med Name: ROPINIROLE HCL 1 MG TABLET] 90 tablet 0    Sig: TAKE 1 TABLET BY MOUTH EVERYDAY AT BEDTIME     Neurology:  Parkinsonian Agents Failed - 02/24/2021 12:17 PM      Failed - Last BP in normal range    BP Readings from Last 1 Encounters:  02/11/21 (!) 163/99         Passed - Valid encounter within last 12 months    Recent Outpatient Visits          1 week ago Bilateral impacted cerumen   [facility] [place], [place], PA-C   3 weeks ago Chronic idiopathic constipation   [facility] Gwyneth Sprout, FNP   6 months ago Essential hypertension   [place], Vickki Muff, PA-C   1 year ago Bilateral impacted cerumen   [place], [place], [place]   1 year ago Hair loss   [place], Clearnce Sorrel, [place]      Future Appointments            In 3 weeks Einar Pheasant, MD [facility], [facility]

## 2021-02-27 DIAGNOSIS — M545 Low back pain, unspecified: Secondary | ICD-10-CM | POA: Diagnosis not present

## 2021-02-27 DIAGNOSIS — M25561 Pain in right knee: Secondary | ICD-10-CM | POA: Diagnosis not present

## 2021-03-07 ENCOUNTER — Telehealth: Payer: Self-pay

## 2021-03-07 ENCOUNTER — Other Ambulatory Visit: Payer: Self-pay | Admitting: Family Medicine

## 2021-03-07 DIAGNOSIS — F339 Major depressive disorder, recurrent, unspecified: Secondary | ICD-10-CM

## 2021-03-07 MED ORDER — VENLAFAXINE HCL ER 75 MG PO CP24
75.0000 mg | ORAL_CAPSULE | Freq: Every day | ORAL | 0 refills | Status: DC
Start: 2021-03-07 — End: 2021-05-30

## 2021-03-07 NOTE — Telephone Encounter (Signed)
Please review, you started patient on Fluoxetine on 02/04/21. KW

## 2021-03-07 NOTE — Telephone Encounter (Signed)
Copied from [place] [phone]. Topic: General - Other >> Mar 07, 2021  9:17 AM Greggory Keen D wrote: Reason for CRM: Pt called saying the Prozac does not work for her.  She  Wants to go back on the Effexor .  She would like refill on the Effexor.  CB#  [phone]  [facility]

## 2021-03-07 NOTE — Telephone Encounter (Signed)
Lmtcb okay for [facility] nurse triage to advise patient of message below from PCP. KW

## 2021-03-13 ENCOUNTER — Telehealth: Payer: Self-pay | Admitting: Internal Medicine

## 2021-03-13 NOTE — Telephone Encounter (Signed)
LMTCB

## 2021-03-13 NOTE — Telephone Encounter (Signed)
Patient called in she advised received voice mail about reschedule appt. I advise patient next avail appt for new patient. Patient only wants to see Dr Nicki Reaper and she advised she has waited for 3 months for appt and now  it was cancelled , didn't want to see NP. Patient would like a call from Dr Nicki Reaper or nurse @ [phone]

## 2021-03-18 NOTE — Telephone Encounter (Signed)
New patient appt with Dr Nicki Reaper sch 1/19 per chart.

## 2021-03-19 ENCOUNTER — Ambulatory Visit: Payer: PPO | Admitting: Internal Medicine

## 2021-03-26 ENCOUNTER — Telehealth: Payer: Self-pay

## 2021-03-26 NOTE — Telephone Encounter (Signed)
Copied from [place] [phone]. Topic: Referral - Request for Referral >> Mar 26, 2021  3:17 PM Yvette Rack wrote: Has patient seen PCP for this complaint? Yes.   *If NO, is insurance requiring patient see PCP for this issue before PCP can refer them? Referral for which specialty: Urology Preferred provider/office: no specific provider or location Reason for referral: pt stated she mentioned her problems to provider

## 2021-03-27 NOTE — Telephone Encounter (Signed)
Patient saw you 01/30/21 looking at office not nothing is mentioned of urinary systems, did you recall have a conversation with patient about urinary problem? KW

## 2021-03-28 NOTE — Telephone Encounter (Signed)
I have to find out reason for urology referral in order to place the order. Left message for patient to call back clarify. KW

## 2021-03-28 NOTE — Telephone Encounter (Signed)
Patient called, left VM to return the call to the office about the urology referral. Will need to know the reason she needs the urology referral.

## 2021-03-31 NOTE — Telephone Encounter (Signed)
Attempted to call patient- left message to call office for additional information

## 2021-04-02 ENCOUNTER — Ambulatory Visit: Payer: Self-pay | Admitting: *Deleted

## 2021-04-02 NOTE — Telephone Encounter (Signed)
°  Chief Complaint: fatigue, tired, slow movements Symptoms: Just feeling like I'm falling apart. Frequency: daily Pertinent Negatives: Patient denies any particular thing that has caused this. Disposition: [] ED /[] Urgent Care (no appt availability in office) / [x] Appointment(In office/virtual)/ []  [place] [facility]/ [] Home Care/ [] Refused Recommended Disposition /[] [place] [facility]/ []  Follow-up with PCP Additional Notes:

## 2021-04-02 NOTE — Telephone Encounter (Signed)
Pt calling in.  I feel like I'm falling apart.   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   I thought I would improve after that.  Something has happened to my back.   I'm miserable.   I'm really slow.   I wondered if I had a stroke but no symptoms of that.    Maybe I'm just getting old. I play cards and I can't deal, get my cards together and I'm so much slower. I have a hard time walking.   I'm still driving.   Reason for Disposition  Weakness is a chronic symptom (recurrent or ongoing AND present > 4 weeks)  Answer Assessment - Initial Assessment Questions 1. DESCRIPTION: "Describe how you are feeling."     See documentation notes.   I put in exactly how she told me she felt. 2. SEVERITY: "How bad is it?"  "Can you stand and walk?"   - MILD - Feels weak or tired, but does not interfere with work, school or normal activities   - [place] to stand and walk; weakness interferes with work, school, or normal activities   - SEVERE - Unable to stand or walk     I can walk but I'm slower. 3. ONSET:  "When did the weakness begin?"     *No Answer* 4. CAUSE: "What do you think is causing the weakness?"     *No Answer* 5. MEDICINES: "Have you recently started a new medicine or had a change in the amount of a medicine?"     *No Answer* 6. OTHER SYMPTOMS: "Do you have any other symptoms?" (e.g., chest pain, fever, cough, SOB, vomiting, diarrhea, bleeding, other areas of pain)     *No Answer* 7. PREGNANCY: "Is there any chance you are pregnant?" "When was your last menstrual period?"     *No Answer*  Protocols used: Weakness (Generalized) and Fatigue-A-AH

## 2021-04-08 ENCOUNTER — Other Ambulatory Visit: Payer: Self-pay

## 2021-04-08 ENCOUNTER — Ambulatory Visit
Admission: RE | Admit: 2021-04-08 | Discharge: 2021-04-08 | Disposition: A | Discharge status: Home / Self Care | Payer: PPO | Source: Ambulatory Visit | Attending: Internal Medicine | Admitting: Internal Medicine

## 2021-04-08 ENCOUNTER — Other Ambulatory Visit: Payer: Self-pay | Admitting: Internal Medicine

## 2021-04-08 DIAGNOSIS — M5489 Other dorsalgia: Secondary | ICD-10-CM | POA: Diagnosis not present

## 2021-04-08 DIAGNOSIS — R410 Disorientation, unspecified: Secondary | ICD-10-CM | POA: Insufficient documentation

## 2021-04-08 DIAGNOSIS — R058 Other specified cough: Secondary | ICD-10-CM | POA: Diagnosis not present

## 2021-04-08 DIAGNOSIS — R270 Ataxia, unspecified: Secondary | ICD-10-CM | POA: Diagnosis not present

## 2021-04-08 DIAGNOSIS — Z1331 Encounter for screening for depression: Secondary | ICD-10-CM | POA: Diagnosis not present

## 2021-04-08 DIAGNOSIS — Z03818 Encounter for observation for suspected exposure to other biological agents ruled out: Secondary | ICD-10-CM | POA: Diagnosis not present

## 2021-04-08 IMAGING — MR MR HEAD W/O CM
12 series · 48 of 48 positions shown · non-contrast
Comparison: None.

CLINICAL DATA: Ataxia, confusion

EXAM:
MRI HEAD WITHOUT CONTRAST
TECHNIQUE: Multiplanar, multiecho pulse sequences of the brain and surrounding
structures were obtained without intravenous contrast.

[Series 5: ax dwi_tracew · axial · 3.0mm · 0.65mm/px · z∈[-105,+50]mm · 4 of 48 slices shown]
[im 1/48]
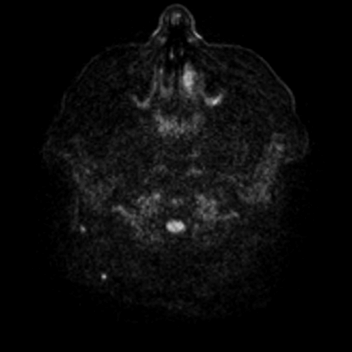
[im 16/48]
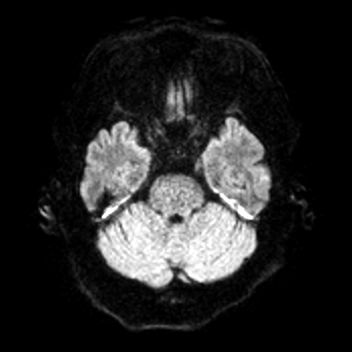
[im 32/48]
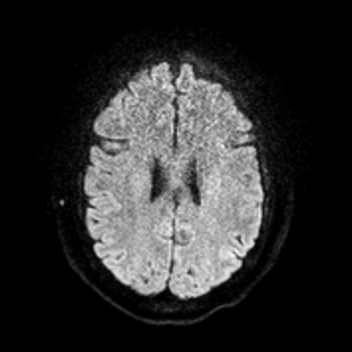
[im 48/48]
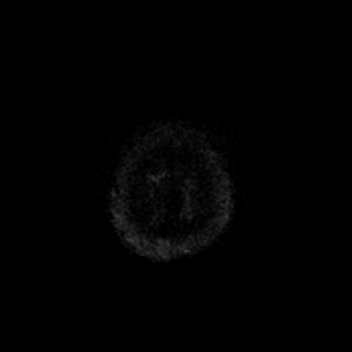

[Series 6: ax dwi_adc · axial · 3.0mm · 0.65mm/px · z∈[-105,+50]mm · 3 of 48 slices shown]
[im 1/48]
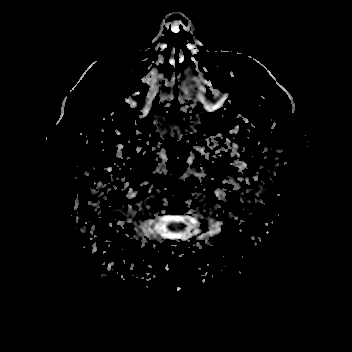
[im 24/48]
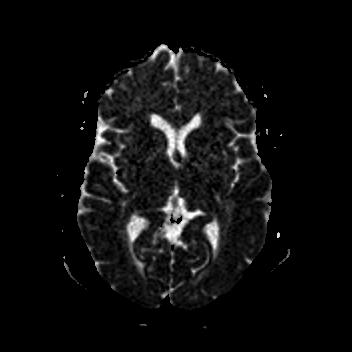
[im 48/48]
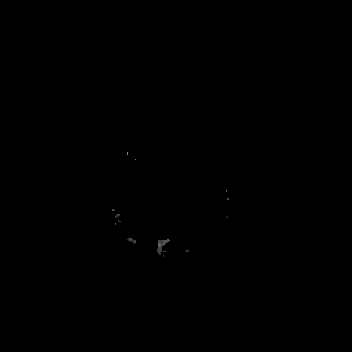

[Series 7: cor dwi_tracew · coronal · 5.0mm · 0.68mm/px · 3 of 40 slices shown]
[im 1/40]
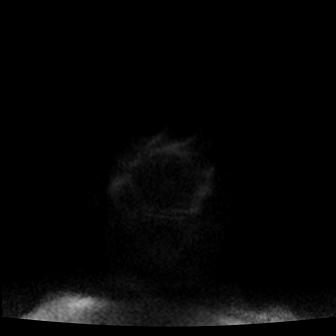
[im 20/40]
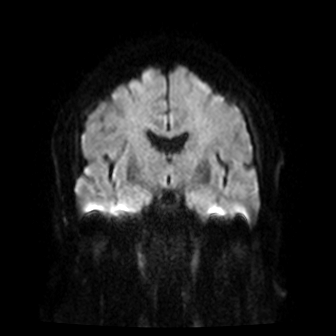
[im 40/40]
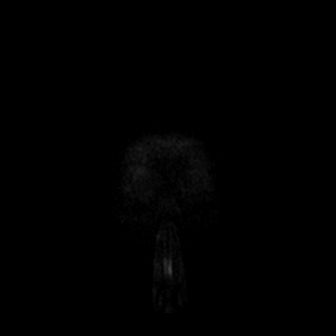

[Series 8: cor dwi_adc · coronal · 5.0mm · 0.68mm/px · 3 of 40 slices shown]
[im 1/40]
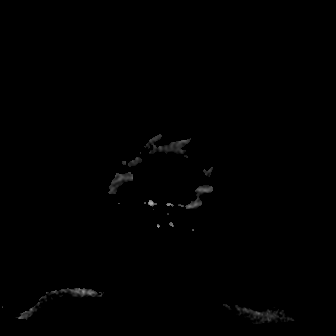
[im 20/40]
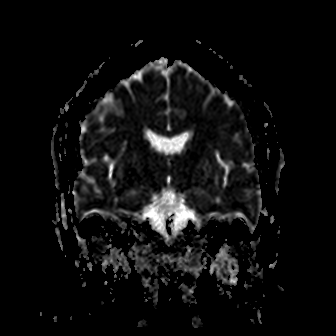
[im 40/40]
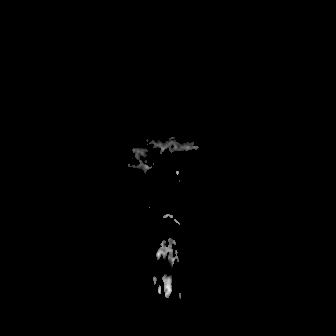

[Series 9: T1 · sagittal · 5.0mm · 0.62mm/px · 2 of 25 slices shown (1 of 2)]
[im 1/25]
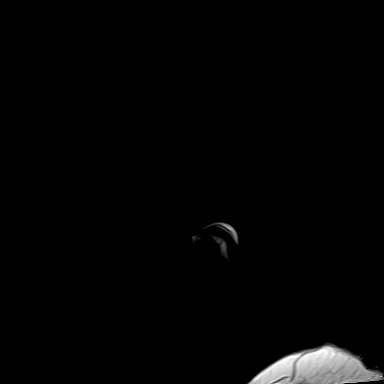
[im 25/25]
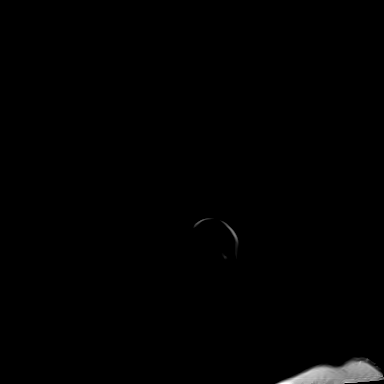

[Series 10: T2 · axial · 3.0mm · 0.45mm/px · z∈[-105,+50]mm · 3 of 48 slices shown (1 of 2)]
[im 1/48]
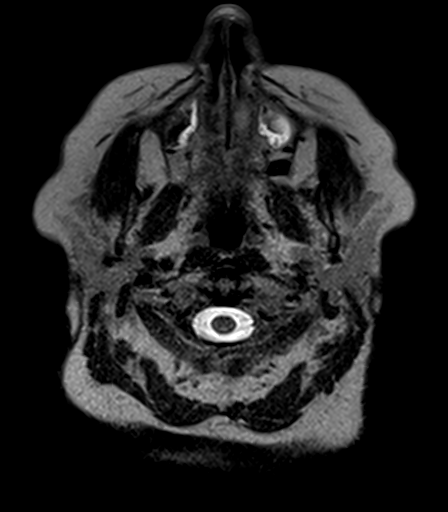
[im 24/48]
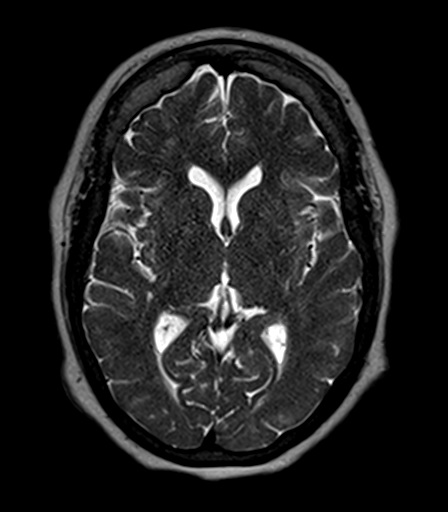
[im 48/48]
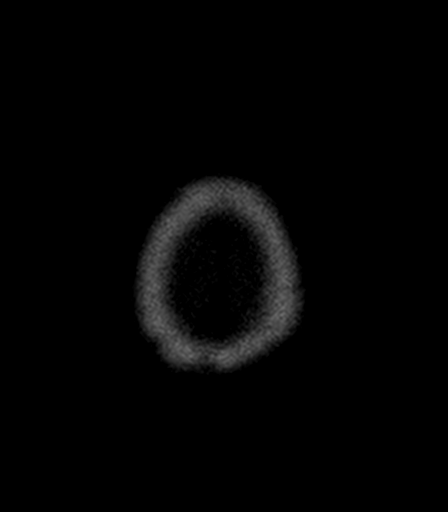

[Series 11: mag_images · axial · 3.0mm · 0.90mm/px · z∈[-116,+61]mm · 4 of 60 slices shown]
[im 1/60]
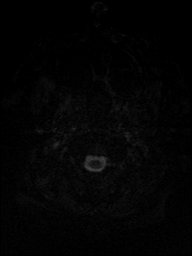
[im 20/60]
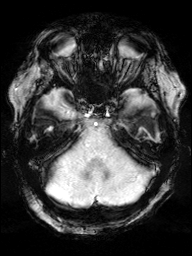
[im 40/60]
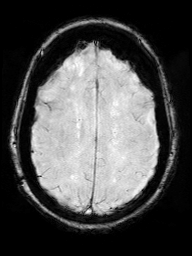
[im 60/60]
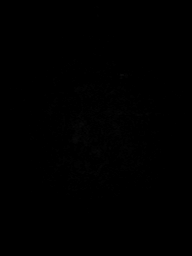

[Series 12: pha_images · axial · 3.0mm · 0.90mm/px · z∈[-116,+55]mm · 4 of 58 slices shown]
[im 1/58]
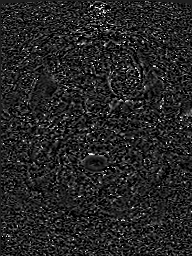
[im 20/58]
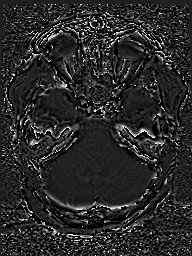
[im 39/58]
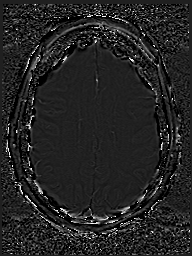
[im 58/58]
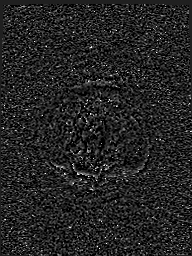

[Series 13: swi_images · axial · 3.0mm · 0.90mm/px · z∈[-116,+61]mm · 4 of 60 slices shown]
[im 1/60]
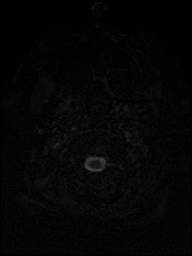
[im 20/60]
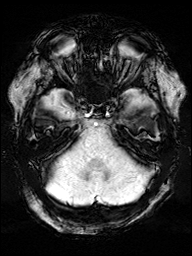
[im 40/60]
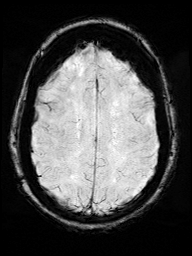
[im 60/60]
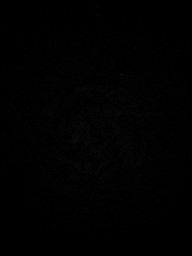

[Series 15: FLAIR · axial · 3.0mm · 0.53mm/px · z∈[-109,+53]mm · 4 of 55 slices shown]
[im 1/55]
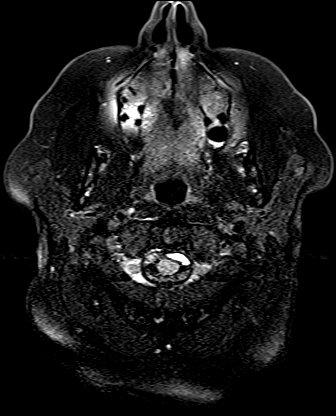
[im 19/55]
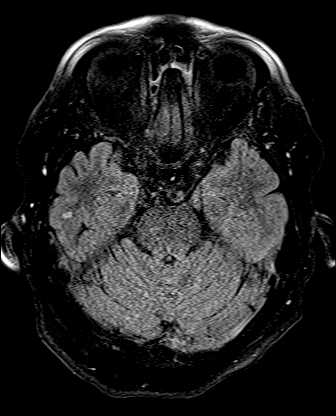
[im 37/55]
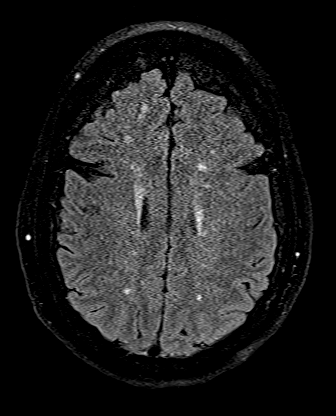
[im 55/55]
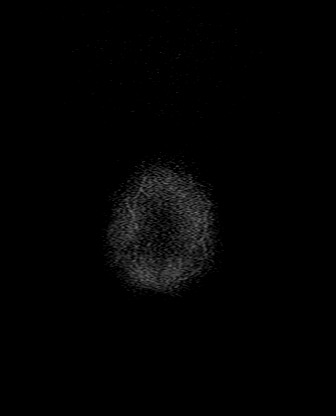

[Series 16: T1 · axial · 1.0mm · 0.98mm/px · z∈[-109,+66]mm · 12 of 175 slices shown (2 of 2)]
[im 1/175]
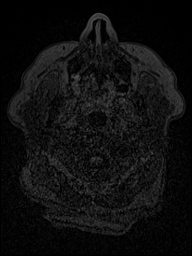
[im 16/175]
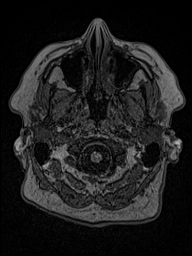
[im 32/175]
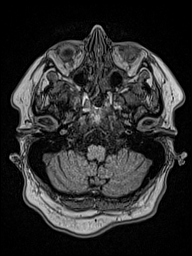
[im 48/175]
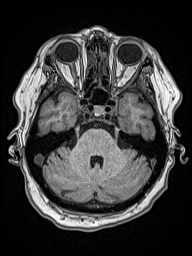
[im 64/175]
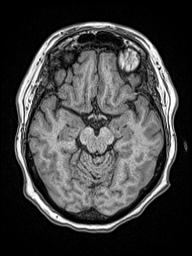
[im 80/175]
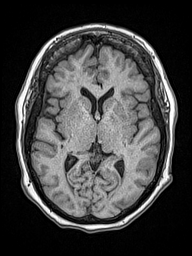
[im 95/175]
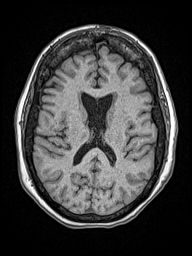
[im 111/175]
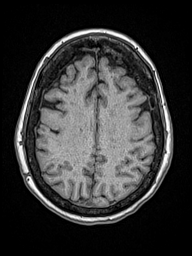
[im 127/175]
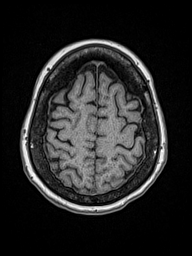
[im 143/175]
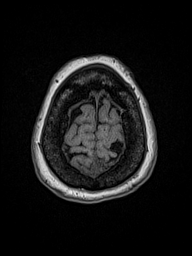
[im 159/175]
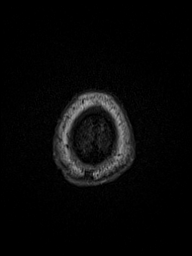
[im 175/175]
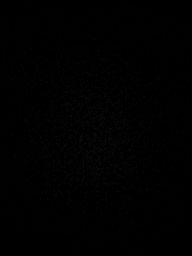

[Series 17: T2 · coronal · 5.0mm · 0.45mm/px · 2 of 31 slices shown (2 of 2)]
[im 1/31]
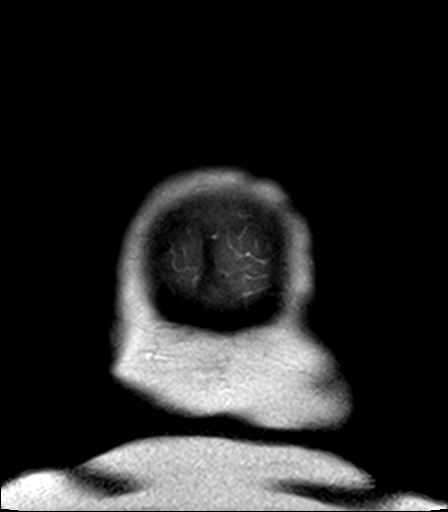
[im 31/31]
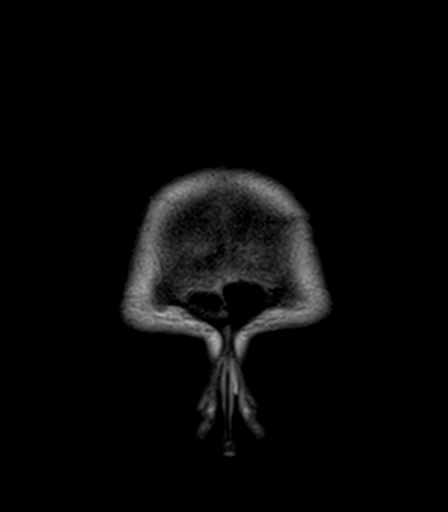

[48 of 48 positions shown; findings below may reference images not displayed]

FINDINGS: Brain: No acute infarction, hemorrhage, hydrocephalus, extra-axial
collection or mass lesion. Mild white matter changes with scattered
small deep white matter hyperintensities bilaterally.

Vascular: Normal arterial flow voids.

Skull and upper cervical spine: Negative

Sinuses/Orbits: Mucosal edema paranasal sinuses. Bilateral cataract
extraction

Other: None
IMPRESSION: No acute abnormality

Mild white matter changes consistent with chronic microvascular
ischemia.

## 2021-04-10 ENCOUNTER — Ambulatory Visit: Payer: PPO | Admitting: Internal Medicine

## 2021-04-10 ENCOUNTER — Telehealth: Payer: Self-pay | Admitting: Family Medicine

## 2021-04-10 NOTE — Telephone Encounter (Signed)
Pt called back stating that she have Covid and she would like to cancel the appt. Try to reschedule appt for pt. Pt stated that she have found another doctor.

## 2021-04-10 NOTE — Telephone Encounter (Signed)
Noted  

## 2021-04-18 ENCOUNTER — Ambulatory Visit: Payer: PPO | Admitting: Family Medicine

## 2021-04-22 ENCOUNTER — Other Ambulatory Visit: Payer: Self-pay | Admitting: Physical Medicine & Rehabilitation

## 2021-04-22 ENCOUNTER — Other Ambulatory Visit (HOSPITAL_COMMUNITY): Payer: Self-pay | Admitting: Physical Medicine & Rehabilitation

## 2021-04-22 DIAGNOSIS — G8929 Other chronic pain: Secondary | ICD-10-CM

## 2021-04-22 DIAGNOSIS — M5441 Lumbago with sciatica, right side: Secondary | ICD-10-CM | POA: Diagnosis not present

## 2021-04-23 ENCOUNTER — Telehealth: Payer: Self-pay | Admitting: Family Medicine

## 2021-04-23 DIAGNOSIS — F5101 Primary insomnia: Secondary | ICD-10-CM

## 2021-04-23 NOTE — Telephone Encounter (Signed)
[facility] faxed refill request for the following medications:  traZODone (DESYREL) 50 MG tablet   Please advise.  

## 2021-04-23 NOTE — Telephone Encounter (Signed)
Please review lov 08/20/20. KW

## 2021-04-24 MED ORDER — TRAZODONE HCL 50 MG PO TABS: 100.0000 mg | ORAL_TABLET | Freq: Every day | ORAL | 0 refills | Status: DC

## 2021-04-25 ENCOUNTER — Other Ambulatory Visit: Payer: Self-pay | Admitting: Family Medicine

## 2021-04-25 DIAGNOSIS — M159 Polyosteoarthritis, unspecified: Secondary | ICD-10-CM

## 2021-04-29 ENCOUNTER — Ambulatory Visit
Admission: RE | Admit: 2021-04-29 | Discharge: 2021-04-29 | Disposition: A | Discharge status: Home / Self Care | Payer: PPO | Source: Ambulatory Visit | Attending: Physical Medicine & Rehabilitation | Admitting: Physical Medicine & Rehabilitation

## 2021-04-29 ENCOUNTER — Other Ambulatory Visit: Payer: Self-pay

## 2021-04-29 DIAGNOSIS — M48061 Spinal stenosis, lumbar region without neurogenic claudication: Secondary | ICD-10-CM | POA: Diagnosis not present

## 2021-04-29 DIAGNOSIS — G8929 Other chronic pain: Secondary | ICD-10-CM | POA: Diagnosis not present

## 2021-04-29 DIAGNOSIS — I10 Essential (primary) hypertension: Secondary | ICD-10-CM | POA: Diagnosis not present

## 2021-04-29 DIAGNOSIS — M5441 Lumbago with sciatica, right side: Secondary | ICD-10-CM | POA: Diagnosis not present

## 2021-04-29 DIAGNOSIS — M5136 Other intervertebral disc degeneration, lumbar region: Secondary | ICD-10-CM | POA: Diagnosis not present

## 2021-04-29 DIAGNOSIS — E039 Hypothyroidism, unspecified: Secondary | ICD-10-CM | POA: Diagnosis not present

## 2021-04-29 IMAGING — MR MR LUMBAR SPINE W/O CM
4 of 5 series · 34 of 48 positions shown · non-contrast
Comparison: None.

CLINICAL DATA: Ongoing lower back pain.  No known injury

EXAM:
MRI LUMBAR SPINE WITHOUT CONTRAST
TECHNIQUE: Multiplanar, multisequence MR imaging of the lumbar spine was
performed. No intravenous contrast was administered.

[Series 5: T2 · sagittal · 4.0mm · 0.81mm/px · 8 of 21 slices shown (1 of 2)]
[im 1/21]
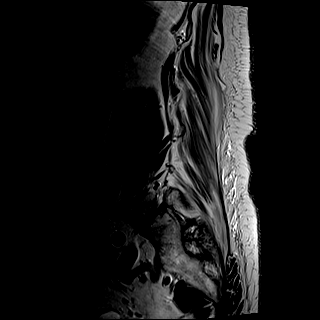
[im 3/21]
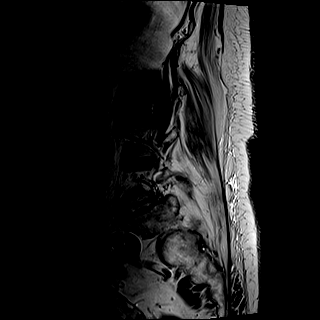
[im 6/21]
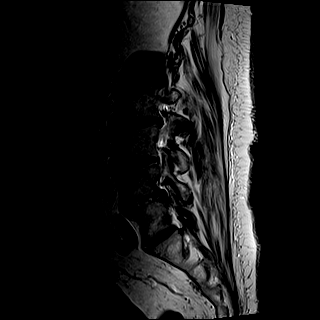
[im 9/21]
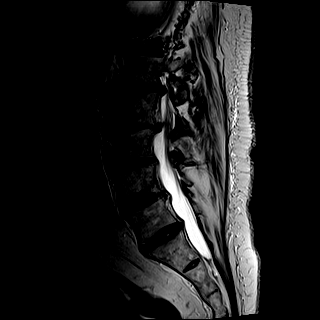
[im 12/21]
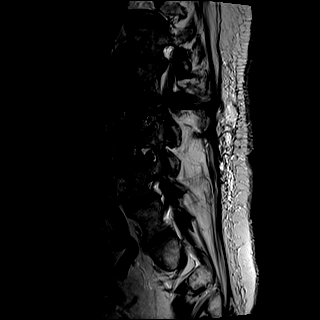
[im 15/21]
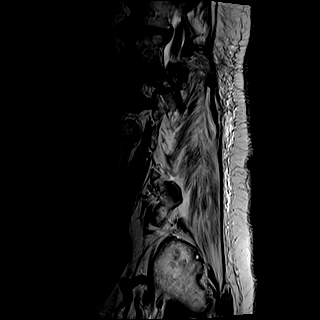
[im 18/21]
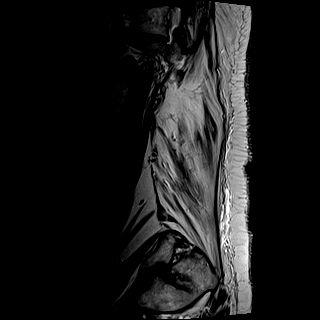
[im 21/21]
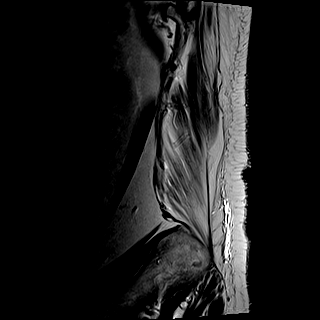

[Series 6: T1 · sagittal · 4.0mm · 0.81mm/px · 8 of 21 slices shown (1 of 2)]
[im 1/21]
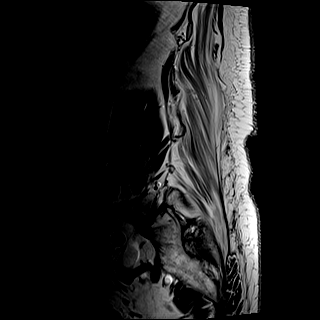
[im 3/21]
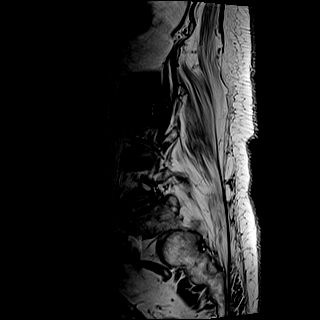
[im 6/21]
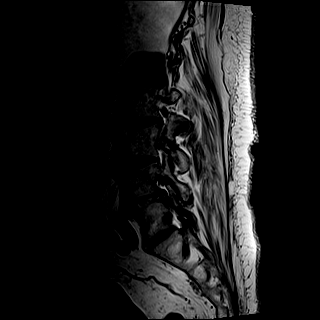
[im 9/21]
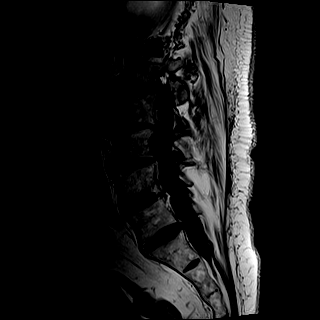
[im 12/21]
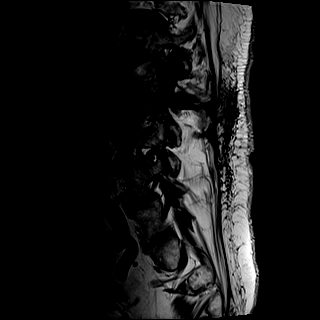
[im 15/21]
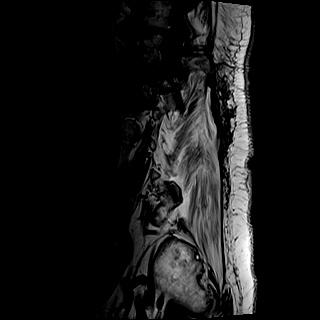
[im 18/21]
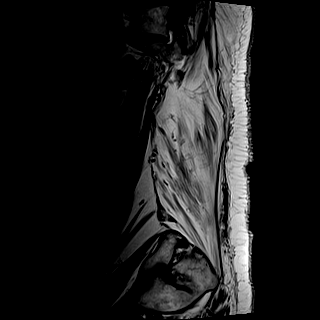
[im 21/21]
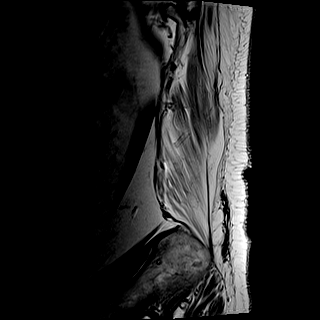

[Series 8: T2 · axial · 4.0mm · 0.78mm/px · z∈[-49,+129]mm · 9 of 33 slices shown (2 of 2)]
[im 1/33]
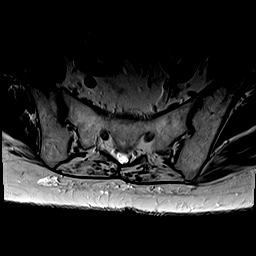
[im 6/33]
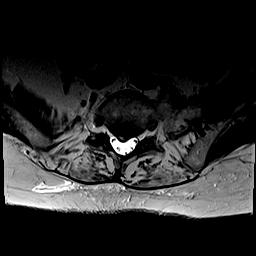
[im 9/33]
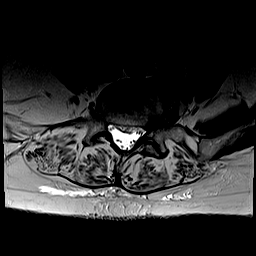
[im 15/33]
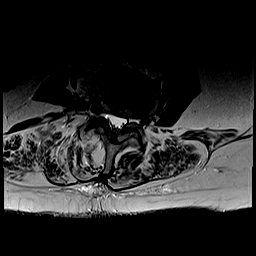
[im 18/33]
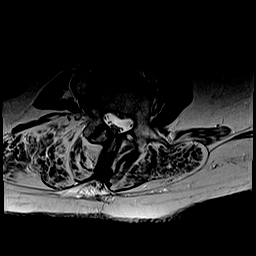
[im 24/33]
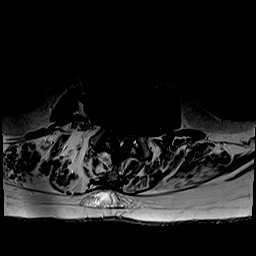
[im 27/33]
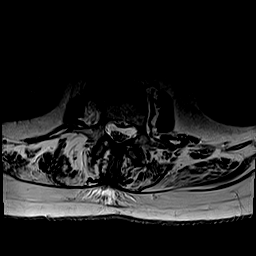
[im 30/33]
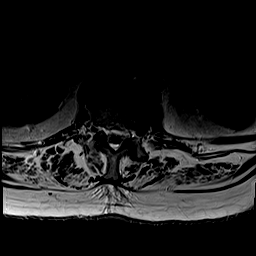
[im 33/33]
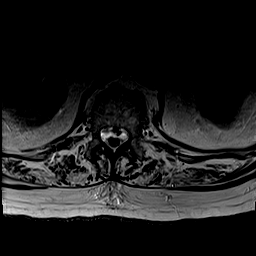

[Series 9: T1 · axial · 4.0mm · 0.39mm/px · z∈[-49,+129]mm · 9 of 33 slices shown (2 of 2)]
[im 1/33]
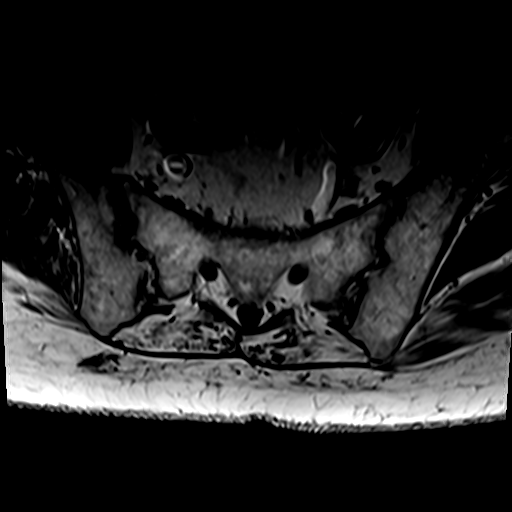
[im 6/33]
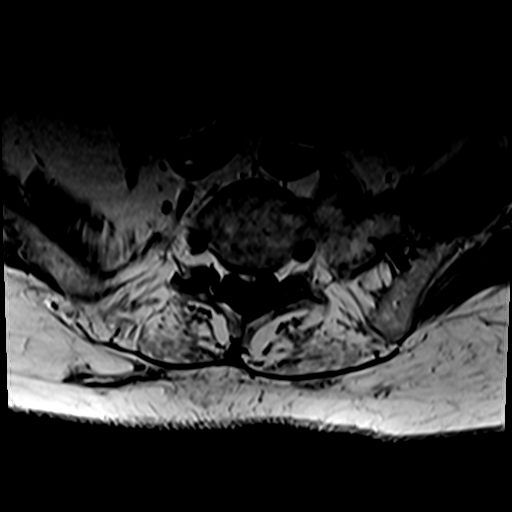
[im 9/33]
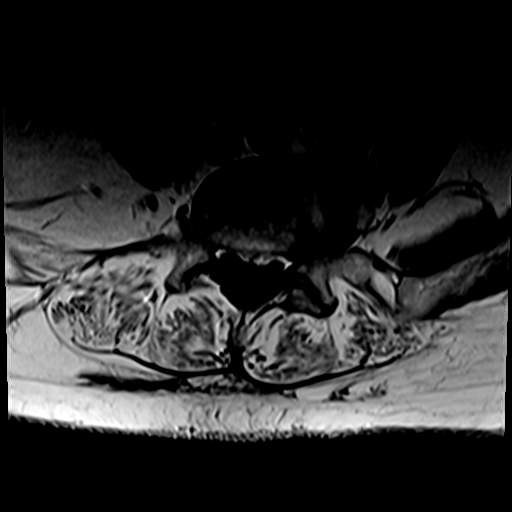
[im 15/33]
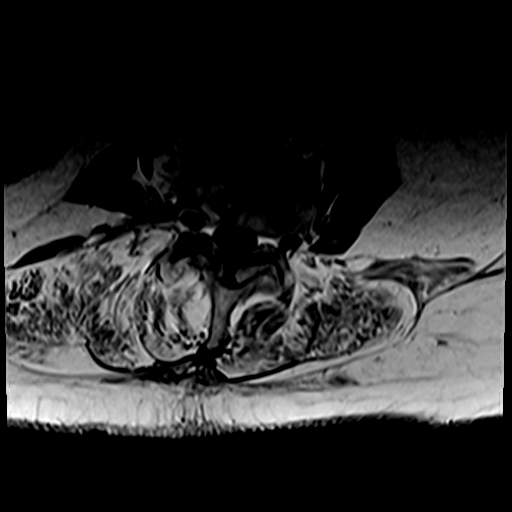
[im 18/33]
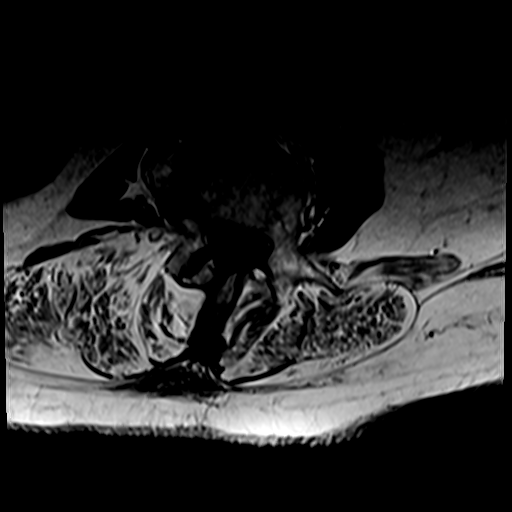
[im 24/33]
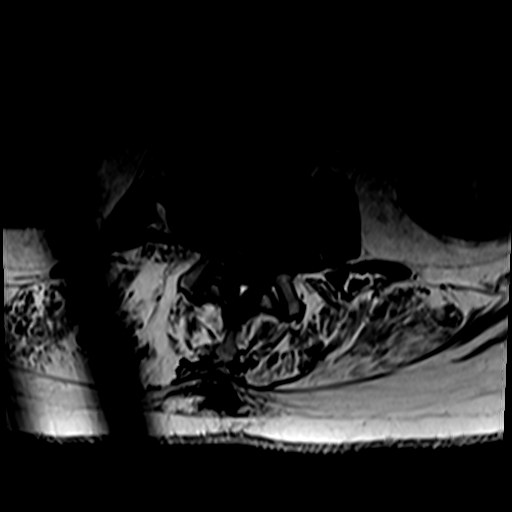
[im 27/33]
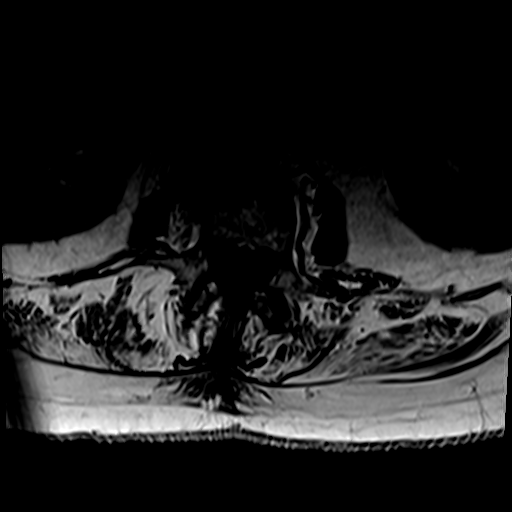
[im 30/33]
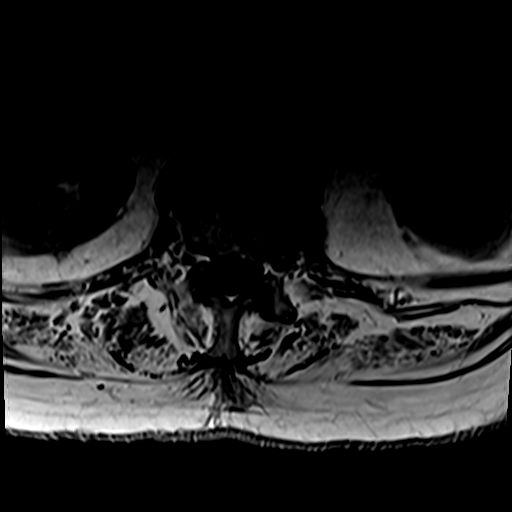
[im 33/33]
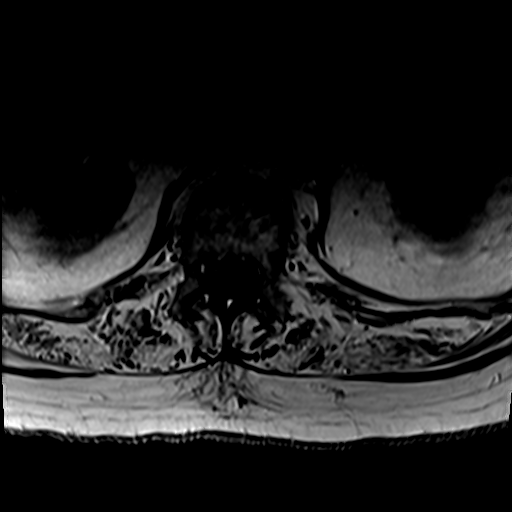

[34 of 48 positions shown; findings below may reference images not displayed]

FINDINGS: Segmentation: 5 lumbar type vertebrae based on the available
coverage

Alignment: Levoscoliosis. Mild retrolisthesis at T12-L1 and L1-2
primarily.

Vertebrae:  No fracture, evidence of discitis, or bone lesion.

Conus medullaris and cauda equina: Conus extends to the L1-2 level.
Conus and cauda equina appear normal.

Paraspinal and other soft tissues: Marked atrophy of intrinsic back
muscles.

Disc levels:

T12- L1: Disc collapse with endplate and facet spurring. Moderate
spinal stenosis encroaching on the conus. Moderate, borderline
advanced right foraminal narrowing.

L1-L2: Advanced disc degeneration with bulky ventral and rightward
spurring. Asymmetric right facet spurring with disc bulging.
High-grade spinal stenosis compressing the nerve roots just below
the conus. Moderate right foraminal narrowing.

L2-L3: Asymmetric rightward disc collapse, endplate ridging, and
facet spurring with mild right foraminal narrowing. Moderate spinal
stenosis.

L3-L4: Asymmetric rightward disc narrowing and spurring with
intervertebral ankylosis. No compressive narrowing

L4-L5: Asymmetric leftward disc narrowing and spurring affecting
endplates and facets. Left far-lateral endplate spurring is bulky.
Left foraminal narrowing that is mild based on axial images. Patent
spinal canal

L5-S1:  Intervertebral ankylosis.  No neural impingement.
IMPRESSION: 1. Scoliosis and advanced lumbar spine degeneration. Intervertebral
ankylosis is present at either L2 or L3 and below.
2. Spinal stenosis that is moderate at T12-L1, L2-3 and more
advanced at L1-2. Moderate foraminal narrowing on the right at
T12-L1 and L1-2.
3. Marked atrophy of intrinsic back muscles.

## 2021-05-06 DIAGNOSIS — M5441 Lumbago with sciatica, right side: Secondary | ICD-10-CM | POA: Diagnosis not present

## 2021-05-06 DIAGNOSIS — M48062 Spinal stenosis, lumbar region with neurogenic claudication: Secondary | ICD-10-CM | POA: Diagnosis not present

## 2021-05-06 DIAGNOSIS — G8929 Other chronic pain: Secondary | ICD-10-CM | POA: Diagnosis not present

## 2021-05-16 DIAGNOSIS — M48062 Spinal stenosis, lumbar region with neurogenic claudication: Secondary | ICD-10-CM | POA: Diagnosis not present

## 2021-05-26 ENCOUNTER — Other Ambulatory Visit: Payer: Self-pay | Admitting: Family Medicine

## 2021-05-27 DIAGNOSIS — I10 Essential (primary) hypertension: Secondary | ICD-10-CM | POA: Diagnosis not present

## 2021-05-27 DIAGNOSIS — E039 Hypothyroidism, unspecified: Secondary | ICD-10-CM | POA: Diagnosis not present

## 2021-05-27 NOTE — Telephone Encounter (Signed)
Requested Prescriptions  ?Pending Prescriptions Disp Refills  ?? levothyroxine (SYNTHROID) 88 MCG tablet [Pharmacy Med Name: LEVOTHYROXINE 88 MCG TABLET] 90 tablet 2  ?  Sig: TAKE 1 TABLET BY MOUTH EVERY DAY  ?  ? Endocrinology:  Hypothyroid Agents Failed - 05/26/2021 10:28 AM  ?  ?  Failed - TSH in normal range and within 360 days  ?  TSH  ?Date Value Ref Range Status  ?01/30/2021 0.085 (L) 0.450 - 4.500 uIU/mL Final  ?   ?  ?  Passed - Valid encounter within last 12 months  ?  Recent Outpatient Visits   ?      ? 3 months ago Bilateral impacted cerumen  ? [facility] Thedore Mins, Ria [place], PA-C  ? 3 months ago Chronic idiopathic constipation  ? [facility] Gwyneth Sprout, FNP  ? 9 months ago Essential hypertension  ? [place], PA-C  ? 1 year ago Bilateral impacted cerumen  ? [place], [place]  ? 1 year ago Hair loss  ? [facility] [place], Anderson Malta M, [place]  ?  ?  ? ?  ?  ?  ? ? ?

## 2021-05-30 ENCOUNTER — Other Ambulatory Visit: Payer: Self-pay | Admitting: Family Medicine

## 2021-05-30 DIAGNOSIS — F339 Major depressive disorder, recurrent, unspecified: Secondary | ICD-10-CM

## 2021-05-30 NOTE — Telephone Encounter (Signed)
Requested medications are due for refill today.  yes ? ?Requested medications are on the active medications list.  yes ? ?Last refill. 03/07/2021 #90 0 refills  ? ?Future visit scheduled.   No ? ?Notes to clinic.  Pharmacy needs dx code. ? ? ? ?Requested Prescriptions  ?Pending Prescriptions Disp Refills  ? venlafaxine XR (EFFEXOR-XR) 75 MG 24 hr capsule [Pharmacy Med Name: VENLAFAXINE HCL ER 75 MG CAP] 90 capsule 0  ?  Sig: TAKE 1 CAPSULE BY MOUTH DAILY WITH BREAKFAST.  ?  ? Psychiatry: Antidepressants - SNRI - desvenlafaxine & venlafaxine Failed - 05/30/2021  1:14 PM  ?  ?  Failed - Last BP in normal range  ?  BP Readings from Last 1 Encounters:  ?02/11/21 (!) 163/99  ?  ?  ?  ?  Failed - Lipid Panel in normal range within the last 12 months  ?  Cholesterol, Total  ?Date Value Ref Range Status  ?08/21/2020 181 100 - 199 mg/dL Final  ? ?LDL Chol Calc (NIH)  ?Date Value Ref Range Status  ?08/21/2020 109 (H) 0 - 99 mg/dL Final  ? ?HDL  ?Date Value Ref Range Status  ?08/21/2020 50 >39 mg/dL Final  ? ?Triglycerides  ?Date Value Ref Range Status  ?08/21/2020 125 0 - 149 mg/dL Final  ? ?  ?  ?  Passed - Cr in normal range and within 360 days  ?  Creatinine  ?Date Value Ref Range Status  ?05/03/2012 0.74 0.60 - 1.30 mg/dL Final  ? ?Creatinine, Ser  ?Date Value Ref Range Status  ?01/30/2021 0.68 0.57 - 1.00 mg/dL Final  ?  ?  ?  ?  Passed - Completed PHQ-2 or PHQ-9 in the last 360 days  ?  ?  Passed - Valid encounter within last 6 months  ?  Recent Outpatient Visits   ? ?      ? 3 months ago Bilateral impacted cerumen  ? [facility] Thedore Mins, [place], PA-C  ? 4 months ago Chronic idiopathic constipation  ? [facility] Gwyneth Sprout, FNP  ? 9 months ago Essential hypertension  ? [place], PA-C  ? 1 year ago Bilateral impacted cerumen  ? [place], [place]  ? 1 year ago Hair loss  ? [facility] [place],  Anderson Malta M, [place]  ? ?  ?  ? ?  ?  ?  ?  ?

## 2021-06-03 DIAGNOSIS — G8929 Other chronic pain: Secondary | ICD-10-CM | POA: Diagnosis not present

## 2021-06-03 DIAGNOSIS — M5441 Lumbago with sciatica, right side: Secondary | ICD-10-CM | POA: Diagnosis not present

## 2021-06-03 DIAGNOSIS — M48062 Spinal stenosis, lumbar region with neurogenic claudication: Secondary | ICD-10-CM | POA: Diagnosis not present

## 2021-06-04 DIAGNOSIS — M48061 Spinal stenosis, lumbar region without neurogenic claudication: Secondary | ICD-10-CM | POA: Diagnosis not present

## 2021-06-04 DIAGNOSIS — M6281 Muscle weakness (generalized): Secondary | ICD-10-CM | POA: Diagnosis not present

## 2021-06-06 DIAGNOSIS — Z6839 Body mass index (BMI) 39.0-39.9, adult: Secondary | ICD-10-CM | POA: Diagnosis not present

## 2021-06-06 DIAGNOSIS — Z791 Long term (current) use of non-steroidal anti-inflammatories (NSAID): Secondary | ICD-10-CM | POA: Diagnosis not present

## 2021-06-06 DIAGNOSIS — I10 Essential (primary) hypertension: Secondary | ICD-10-CM | POA: Diagnosis not present

## 2021-06-06 DIAGNOSIS — M5441 Lumbago with sciatica, right side: Secondary | ICD-10-CM | POA: Diagnosis not present

## 2021-06-06 DIAGNOSIS — G8929 Other chronic pain: Secondary | ICD-10-CM | POA: Diagnosis not present

## 2021-06-06 DIAGNOSIS — Z9181 History of falling: Secondary | ICD-10-CM | POA: Diagnosis not present

## 2021-06-06 DIAGNOSIS — M159 Polyosteoarthritis, unspecified: Secondary | ICD-10-CM | POA: Diagnosis not present

## 2021-06-06 DIAGNOSIS — Z96653 Presence of artificial knee joint, bilateral: Secondary | ICD-10-CM | POA: Diagnosis not present

## 2021-06-06 DIAGNOSIS — M48062 Spinal stenosis, lumbar region with neurogenic claudication: Secondary | ICD-10-CM | POA: Diagnosis not present

## 2021-06-06 DIAGNOSIS — K5900 Constipation, unspecified: Secondary | ICD-10-CM | POA: Diagnosis not present

## 2021-06-06 DIAGNOSIS — E039 Hypothyroidism, unspecified: Secondary | ICD-10-CM | POA: Diagnosis not present

## 2021-06-11 DIAGNOSIS — G8929 Other chronic pain: Secondary | ICD-10-CM | POA: Diagnosis not present

## 2021-06-11 DIAGNOSIS — K5900 Constipation, unspecified: Secondary | ICD-10-CM | POA: Diagnosis not present

## 2021-06-11 DIAGNOSIS — Z96653 Presence of artificial knee joint, bilateral: Secondary | ICD-10-CM | POA: Diagnosis not present

## 2021-06-11 DIAGNOSIS — M5441 Lumbago with sciatica, right side: Secondary | ICD-10-CM | POA: Diagnosis not present

## 2021-06-11 DIAGNOSIS — E039 Hypothyroidism, unspecified: Secondary | ICD-10-CM | POA: Diagnosis not present

## 2021-06-11 DIAGNOSIS — Z9181 History of falling: Secondary | ICD-10-CM | POA: Diagnosis not present

## 2021-06-11 DIAGNOSIS — M48062 Spinal stenosis, lumbar region with neurogenic claudication: Secondary | ICD-10-CM | POA: Diagnosis not present

## 2021-06-11 DIAGNOSIS — I10 Essential (primary) hypertension: Secondary | ICD-10-CM | POA: Diagnosis not present

## 2021-06-11 DIAGNOSIS — M159 Polyosteoarthritis, unspecified: Secondary | ICD-10-CM | POA: Diagnosis not present

## 2021-06-11 DIAGNOSIS — Z791 Long term (current) use of non-steroidal anti-inflammatories (NSAID): Secondary | ICD-10-CM | POA: Diagnosis not present

## 2021-06-11 DIAGNOSIS — Z6839 Body mass index (BMI) 39.0-39.9, adult: Secondary | ICD-10-CM | POA: Diagnosis not present

## 2021-06-23 DIAGNOSIS — M48062 Spinal stenosis, lumbar region with neurogenic claudication: Secondary | ICD-10-CM | POA: Diagnosis not present

## 2021-06-23 DIAGNOSIS — Z6839 Body mass index (BMI) 39.0-39.9, adult: Secondary | ICD-10-CM | POA: Diagnosis not present

## 2021-06-23 DIAGNOSIS — I10 Essential (primary) hypertension: Secondary | ICD-10-CM | POA: Diagnosis not present

## 2021-06-23 DIAGNOSIS — K5900 Constipation, unspecified: Secondary | ICD-10-CM | POA: Diagnosis not present

## 2021-06-23 DIAGNOSIS — Z791 Long term (current) use of non-steroidal anti-inflammatories (NSAID): Secondary | ICD-10-CM | POA: Diagnosis not present

## 2021-06-23 DIAGNOSIS — M159 Polyosteoarthritis, unspecified: Secondary | ICD-10-CM | POA: Diagnosis not present

## 2021-06-23 DIAGNOSIS — Z96653 Presence of artificial knee joint, bilateral: Secondary | ICD-10-CM | POA: Diagnosis not present

## 2021-06-23 DIAGNOSIS — M5441 Lumbago with sciatica, right side: Secondary | ICD-10-CM | POA: Diagnosis not present

## 2021-06-23 DIAGNOSIS — Z9181 History of falling: Secondary | ICD-10-CM | POA: Diagnosis not present

## 2021-06-23 DIAGNOSIS — E039 Hypothyroidism, unspecified: Secondary | ICD-10-CM | POA: Diagnosis not present

## 2021-06-23 DIAGNOSIS — G8929 Other chronic pain: Secondary | ICD-10-CM | POA: Diagnosis not present

## 2021-07-10 DIAGNOSIS — G4733 Obstructive sleep apnea (adult) (pediatric): Secondary | ICD-10-CM | POA: Diagnosis not present

## 2021-07-20 ENCOUNTER — Other Ambulatory Visit: Payer: Self-pay | Admitting: Family Medicine

## 2021-07-20 DIAGNOSIS — K219 Gastro-esophageal reflux disease without esophagitis: Secondary | ICD-10-CM

## 2021-07-25 ENCOUNTER — Other Ambulatory Visit: Payer: Self-pay | Admitting: Family Medicine

## 2021-07-25 DIAGNOSIS — F339 Major depressive disorder, recurrent, unspecified: Secondary | ICD-10-CM

## 2021-07-25 NOTE — Telephone Encounter (Signed)
Requested Prescriptions  ?Pending Prescriptions Disp Refills  ?? venlafaxine XR (EFFEXOR-XR) 75 MG 24 hr capsule [Pharmacy Med Name: VENLAFAXINE HCL ER 75 MG CAP] 90 capsule 0  ?  Sig: TAKE 1 CAPSULE BY MOUTH DAILY WITH BREAKFAST.  ?  ? Psychiatry: Antidepressants - SNRI - desvenlafaxine & venlafaxine Failed - 07/25/2021 12:57 PM  ?  ?  Failed - Last BP in normal range  ?  BP Readings from Last 1 Encounters:  ?02/11/21 (!) 163/99  ?   ?  ?  Failed - Lipid Panel in normal range within the last 12 months  ?  Cholesterol, Total  ?Date Value Ref Range Status  ?08/21/2020 181 100 - 199 mg/dL Final  ? ?LDL Chol Calc (NIH)  ?Date Value Ref Range Status  ?08/21/2020 109 (H) 0 - 99 mg/dL Final  ? ?HDL  ?Date Value Ref Range Status  ?08/21/2020 50 >39 mg/dL Final  ? ?Triglycerides  ?Date Value Ref Range Status  ?08/21/2020 125 0 - 149 mg/dL Final  ? ?  ?  ?  Passed - Cr in normal range and within 360 days  ?  Creatinine  ?Date Value Ref Range Status  ?05/03/2012 0.74 0.60 - 1.30 mg/dL Final  ? ?Creatinine, Ser  ?Date Value Ref Range Status  ?01/30/2021 0.68 0.57 - 1.00 mg/dL Final  ?   ?  ?  Passed - Completed PHQ-2 or PHQ-9 in the last 360 days  ?  ?  Passed - Valid encounter within last 6 months  ?  Recent Outpatient Visits   ?      ? 5 months ago Bilateral impacted cerumen  ? [place], PA-C  ? 5 months ago Chronic idiopathic constipation  ? [facility] Gwyneth Sprout, FNP  ? 11 months ago Essential hypertension  ? [place], PA-C  ? 1 year ago Bilateral impacted cerumen  ? [place], [place]  ? 1 year ago Hair loss  ? [facility] [place], Anderson Malta M, [place]  ?  ?  ? ?  ?  ?  ? ?

## 2021-08-26 DIAGNOSIS — I10 Essential (primary) hypertension: Secondary | ICD-10-CM | POA: Diagnosis not present

## 2021-08-26 DIAGNOSIS — E039 Hypothyroidism, unspecified: Secondary | ICD-10-CM | POA: Diagnosis not present

## 2021-08-26 DIAGNOSIS — M48062 Spinal stenosis, lumbar region with neurogenic claudication: Secondary | ICD-10-CM | POA: Diagnosis not present

## 2021-09-01 DIAGNOSIS — D2261 Melanocytic nevi of right upper limb, including shoulder: Secondary | ICD-10-CM | POA: Diagnosis not present

## 2021-09-01 DIAGNOSIS — D2271 Melanocytic nevi of right lower limb, including hip: Secondary | ICD-10-CM | POA: Diagnosis not present

## 2021-09-01 DIAGNOSIS — D225 Melanocytic nevi of trunk: Secondary | ICD-10-CM | POA: Diagnosis not present

## 2021-09-01 DIAGNOSIS — L570 Actinic keratosis: Secondary | ICD-10-CM | POA: Diagnosis not present

## 2021-09-01 DIAGNOSIS — L821 Other seborrheic keratosis: Secondary | ICD-10-CM | POA: Diagnosis not present

## 2021-09-01 DIAGNOSIS — D2262 Melanocytic nevi of left upper limb, including shoulder: Secondary | ICD-10-CM | POA: Diagnosis not present

## 2021-09-01 DIAGNOSIS — X32XXXA Exposure to sunlight, initial encounter: Secondary | ICD-10-CM | POA: Diagnosis not present

## 2021-09-01 DIAGNOSIS — D2272 Melanocytic nevi of left lower limb, including hip: Secondary | ICD-10-CM | POA: Diagnosis not present

## 2021-09-10 ENCOUNTER — Encounter: Payer: Self-pay | Admitting: Physician Assistant

## 2021-09-10 ENCOUNTER — Ambulatory Visit (INDEPENDENT_AMBULATORY_CARE_PROVIDER_SITE_OTHER): Payer: PPO | Admitting: Physician Assistant

## 2021-09-10 VITALS — BP 148/102 | HR 75 | Temp 98.1°F | Resp 16

## 2021-09-10 DIAGNOSIS — I10 Essential (primary) hypertension: Secondary | ICD-10-CM | POA: Diagnosis not present

## 2021-09-10 DIAGNOSIS — H6123 Impacted cerumen, bilateral: Secondary | ICD-10-CM

## 2021-09-10 NOTE — Progress Notes (Signed)
I,Robin Espinoza,acting as a Education administrator for [employer], PA-C.,have documented all relevant documentation on the behalf of [name], PA-C,as directed by  [employer], PA-C while in the presence of [employer], PA-C.   Established patient visit   Patient: Robin Espinoza   DOB: 17-Apr-1945   76 y.o. Female  MRN: [id number] Visit Date: 09/10/2021  Today's healthcare provider: [name], PA-C   Chief Complaint  Patient presents with   Ear Fullness   Subjective    Robin Espinoza is a 76 yr old female who presenting today requesting ear lavage.  States her ears accumulate wax and feel stopped up. Difficulty hearing.    Medications: Outpatient Medications Prior to Visit  Medication Sig   acetaminophen (TYLENOL) 650 MG CR tablet Take 1,300 mg by mouth every 8 (eight) hours as needed for pain.   Carboxymethylcellulose Sodium (THERATEARS OP) Place 1 drop into both eyes daily as needed (dry eyes).   lactulose (CHRONULAC) 10 GM/15ML solution Take 45 mLs (30 g total) by mouth daily as needed for mild constipation.   levothyroxine (SYNTHROID) 88 MCG tablet TAKE 1 TABLET BY MOUTH EVERY DAY   lovastatin (MEVACOR) 40 MG tablet TAKE 1 TABLET BY MOUTH EVERYDAY AT BEDTIME   meloxicam (MOBIC) 15 MG tablet TAKE 1 TABLET (15 MG TOTAL) BY MOUTH DAILY.   metoprolol succinate (TOPROL-XL) 100 MG 24 hr tablet Take 1 tablet (100 mg total) by mouth daily. TAKE WITH OR IMMEDIATELY FOLLOWING A MEAL.   NON FORMULARY CPAP   omeprazole (PRILOSEC) 20 MG capsule TAKE 1 CAPSULE BY MOUTH EVERY DAY   polyethylene glycol powder (GLYCOLAX/MIRALAX) 17 GM/SCOOP powder Take 510 g by mouth daily.   rOPINIRole (REQUIP) 1 MG tablet TAKE 1 TABLET BY MOUTH EVERYDAY AT BEDTIME   senna (SENOKOT) 8.6 MG tablet Take 2 tablets (17.2 mg total) by mouth at bedtime.   traMADol (ULTRAM) 50 MG tablet Take 50 mg by mouth 2 (two) times daily as needed.   traZODone (DESYREL) 50 MG tablet Take 2 tablets (100 mg total) by mouth daily.    venlafaxine XR (EFFEXOR-XR) 75 MG 24 hr capsule TAKE 1 CAPSULE BY MOUTH DAILY WITH BREAKFAST.   Vitamin D, Ergocalciferol, (DRISDOL) 1.25 MG (50000 UNIT) CAPS capsule TAKE 1 CAPSULE (50,000 UNITS TOTAL) BY MOUTH EVERY 7 (SEVEN) DAYS   Vitamin D3 (VITAMIN D) 25 MCG tablet Take 1,000 Units by mouth daily.   No facility-administered medications prior to visit.    Review of Systems  Constitutional: Negative.   HENT:  Positive for hearing loss. Negative for ear discharge, ear pain, mouth sores, nosebleeds, postnasal drip, sinus pressure, sinus pain and sore throat.   Respiratory: Negative.    Cardiovascular: Negative.   Except see HPI     Objective    BP (!) 148/102 (BP Location: Right Arm, Patient Position: Sitting, Cuff Size: Large)   Pulse 75   Temp 98.1 F (36.7 C) (Oral)   Resp 16   SpO2 97%    Physical Exam Vitals reviewed.  Constitutional:      General: She is not in acute distress.    Appearance: Normal appearance. She is well-developed. She is obese. She is not diaphoretic.  HENT:     Head: Normocephalic and atraumatic.     Right Ear: There is impacted cerumen.     Left Ear: There is impacted cerumen.     Ears:     Comments: Post irrigation: Normal TM, ear canal, external ear, bilaterally  Nose: Rhinorrhea present.     Mouth/Throat:     Mouth: Mucous membranes are moist.  Eyes:     General: No scleral icterus.    Conjunctiva/sclera: Conjunctivae normal.     Pupils: Pupils are equal, round, and reactive to light.  Neck:     Thyroid: No thyromegaly.  Cardiovascular:     Rate and Rhythm: Normal rate and regular rhythm.     Pulses: Normal pulses.     Heart sounds: Normal heart sounds. No murmur heard. Pulmonary:     Effort: Pulmonary effort is normal. No respiratory distress.     Breath sounds: Normal breath sounds. No wheezing, rhonchi or rales.  Musculoskeletal:     Cervical back: Neck supple.     Right lower leg: No edema.     Left lower leg: No edema.   Lymphadenopathy:     Cervical: No cervical adenopathy.  Skin:    General: Skin is warm and dry.     Findings: No rash.  Neurological:     Mental Status: She is alert and oriented to person, place, and time. Mental status is at baseline.  Psychiatric:        Mood and Affect: Mood normal.        Behavior: Behavior normal.        Thought Content: Thought content normal.        Judgment: Judgment normal.     Ambulates with walker  No results found for any visits on 09/10/21.  Assessment & Plan     1. Bilateral impacted cerumen  History of chronic cerumen impaction  CMA irrigated bilateral ears today.  Exam post irrigation with normal tympanic membrane and normal canal.  Patient states she can hear much better after irrigation. Patient is advised using cotton ball dipped in mineral oil and placing in the external canal once per week to help liquefy of cerumen and aids in normal elimination mechanisms  Might need to repeat in 6-8 mo Return if symptoms worsen or fail to improve.     2. HTN Chronic. Unstable. BP today was 148/102. Pt advised to measure her BP at home and bring BP log to the next appt Continue her current regimen. Might need to change her current medications FU this week.  The patient was advised to call back or seek an in-person evaluation if the symptoms worsen or if the condition fails to improve as anticipated.  I discussed the assessment and treatment plan with the patient. The patient was provided an opportunity to ask questions and all were answered. The patient agreed with the plan and demonstrated an understanding of the instructions.  The entirety of the information documented in the History of Present Illness, Review of Systems and Physical Exam were personally obtained by me. Portions of this information were initially documented by the CMA and reviewed by me for thoroughness and accuracy.  Portions of this note were created using dictation software and  may contain typographical errors.       [name], PA-C  [facility] [phone] (phone) [phone] (fax)  [place]

## 2021-09-12 ENCOUNTER — Ambulatory Visit (INDEPENDENT_AMBULATORY_CARE_PROVIDER_SITE_OTHER): Payer: PPO | Admitting: Physician Assistant

## 2021-09-12 VITALS — BP 153/81 | HR 64

## 2021-09-12 DIAGNOSIS — G250 Essential tremor: Secondary | ICD-10-CM | POA: Diagnosis not present

## 2021-09-12 DIAGNOSIS — I10 Essential (primary) hypertension: Secondary | ICD-10-CM

## 2021-09-12 MED ORDER — LOSARTAN POTASSIUM-HCTZ 50-12.5 MG PO TABS: 1.0000 | ORAL_TABLET | Freq: Every day | ORAL | 0 refills | Status: DC

## 2021-09-15 ENCOUNTER — Other Ambulatory Visit: Payer: Self-pay | Admitting: Physician Assistant

## 2021-09-15 DIAGNOSIS — I10 Essential (primary) hypertension: Secondary | ICD-10-CM

## 2021-10-01 DIAGNOSIS — S01311A Laceration without foreign body of right ear, initial encounter: Secondary | ICD-10-CM | POA: Diagnosis not present

## 2021-10-06 ENCOUNTER — Telehealth: Payer: Self-pay | Admitting: Family Medicine

## 2021-10-06 NOTE — Telephone Encounter (Signed)
Copied from [place]. Topic: Medicare AWV >> Oct 06, 2021 10:47 AM Jae Dire wrote: Reason for CRM:  Left message for patient to call back and schedule Medicare Annual Wellness Visit (AWV) in office.   If unable to come into the office for AWV,  please offer to do virtually or by telephone.  Last AWV:  04/04/2020  Please schedule at anytime with [facility] Health Advisor.  30 minute appointment for Virtual or phone 45 minute appointment for in office or Initial virtual/phone  Any questions, please contact me at [phone]

## 2021-10-08 NOTE — Progress Notes (Deleted)
Referring Physician:  Gwyneth Sprout, [place] [address] [place],  [place] [address]  Primary Physician:  Gwyneth Sprout, FNP  History of Present Illness: 10/08/2021 [name] is here today with a chief complaint of ***back pain? Leg pain?  Duration: *** Location: *** Quality: *** Severity: *** 10/10 Precipitating: aggravated by ***walking, standing Modifying factors: made better by *** Weakness: none Timing: ***constant Bowel/Bladder Dysfunction: none  Conservative measures:  Physical therapy: *** has not participated in? Multimodal medical therapy including regular antiinflammatories: *** meloxicam, tramadol Injections: *** has not had epidural steroid injections  Past Surgery: ***denies  Robin Espinoza has ***no symptoms of cervical myelopathy.  The symptoms are causing a significant impact on the patient's life.   Review of Systems:  A 10 point review of systems is negative, except for the pertinent positives and negatives detailed in the HPI.  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    Social History: Social History   Tobacco Use   Smoking status: Former   Smokeless tobacco: Never   Tobacco comments:    in college  Vaping Use   Vaping Use: Never used  Substance Use Topics   Alcohol use: Yes    Comment: rarely 1 drink   Drug use: No    Family Medical History: Family History  Problem Relation Age of Onset   Diabetes Father    Breast cancer Neg Hx     Physical Examination: There were no vitals filed for this visit.  General: Patient is well developed, well nourished, calm, collected, and in no apparent distress. Attention to examination is appropriate.  Neck:   Supple.  ***Full range of motion.  Respiratory: Patient is breathing without any difficulty.   NEUROLOGICAL:     Awake, alert, oriented to person, place, and time.  Speech is clear and fluent. Fund of knowledge is appropriate.   Cranial Nerves: Pupils equal round and reactive to light.  Facial tone is symmetric.  Facial sensation is symmetric. Shoulder shrug is symmetric. Tongue protrusion is midline.  There is no pronator drift.  ROM of spine: full.    Strength: Side Biceps Triceps Deltoid Interossei Grip Wrist Ext. Wrist Flex.  R '5 5 5 5 5 5 5  '$ L '5 5 5 5 5 5 5   '$ Side Iliopsoas Quads Hamstring PF DF EHL  R '5 5 5 5 5 5  '$ L '5 5 5 5 5 5   '$ Reflexes are ***2+ and symmetric at the biceps, triceps, brachioradialis, patella and achilles.   Hoffman's is absent.  Clonus is not present.  Toes are down-going.  Bilateral upper and lower extremity sensation is intact to light touch.    No evidence of dysmetria noted.  Gait is normal.   ***No difficulty with tandem gait.    Medical Decision Making  Imaging: ***  I have personally reviewed the images and agree with the above interpretation.  Assessment and Plan: [name] is a pleasant 75 y.o.  female with ***   I spent a total of *** minutes in face-to-face and non-face-to-face activities related to this patient's care today.  Thank you for involving me in the care of this patient.      [place] K. Izora Ribas MD, [facility] Neurosurgery

## 2021-10-09 ENCOUNTER — Ambulatory Visit: Payer: PPO | Admitting: Neurosurgery

## 2021-10-28 ENCOUNTER — Telehealth: Payer: Self-pay | Admitting: Family Medicine

## 2021-10-28 NOTE — Telephone Encounter (Signed)
Copied from [place] [id number]. Topic: Medicare AWV >> Oct 28, 2021 10:30 AM Jae Dire wrote: Reason for CRM:  Left message for patient to call back and schedule Medicare Annual Wellness Visit (AWV) in office.   If unable to come into the office for AWV,  please offer to do virtually or by telephone.  Last AWV: 04/04/2020  Please schedule at anytime with [facility] Health Advisor.  30 minute appointment for Virtual or phone 45 minute appointment for in office or Initial virtual/phone  Any questions, please contact me at [phone]

## 2021-11-09 ENCOUNTER — Other Ambulatory Visit: Payer: Self-pay | Admitting: Family Medicine

## 2021-11-09 DIAGNOSIS — F339 Major depressive disorder, recurrent, unspecified: Secondary | ICD-10-CM

## 2021-11-09 DIAGNOSIS — K219 Gastro-esophageal reflux disease without esophagitis: Secondary | ICD-10-CM

## 2021-11-09 DIAGNOSIS — M159 Polyosteoarthritis, unspecified: Secondary | ICD-10-CM

## 2021-11-27 ENCOUNTER — Telehealth: Payer: Self-pay | Admitting: Family Medicine

## 2021-11-27 DIAGNOSIS — I10 Essential (primary) hypertension: Secondary | ICD-10-CM | POA: Diagnosis not present

## 2021-11-27 DIAGNOSIS — E039 Hypothyroidism, unspecified: Secondary | ICD-10-CM | POA: Diagnosis not present

## 2021-11-27 DIAGNOSIS — M48062 Spinal stenosis, lumbar region with neurogenic claudication: Secondary | ICD-10-CM | POA: Diagnosis not present

## 2021-11-27 NOTE — Telephone Encounter (Signed)
Copied from [place] [phone]. Topic: Medicare AWV >> Nov 27, 2021  1:38 PM Jae Dire wrote: Reason for CRM:  Left message for patient to call back and schedule Medicare Annual Wellness Visit (AWV) in office.   If unable to come into the office for AWV,  please offer to do virtually or by telephone.  Last AWV:  04/04/2020  Please schedule at anytime with [facility] Health Advisor.  30 minute appointment for Virtual or phone 45 minute appointment for in office or Initial virtual/phone  Any questions, please contact me at [phone]

## 2021-12-17 ENCOUNTER — Telehealth: Payer: Self-pay | Admitting: Family Medicine

## 2021-12-17 ENCOUNTER — Other Ambulatory Visit: Payer: Self-pay | Admitting: Physician Assistant

## 2021-12-17 DIAGNOSIS — I10 Essential (primary) hypertension: Secondary | ICD-10-CM

## 2021-12-17 NOTE — Telephone Encounter (Signed)
Copied from [place] [id number]. Topic: Medicare AWV >> Dec 17, 2021  1:41 PM Jae Dire wrote: Reason for CRM:  Left message for patient to call back and schedule Medicare Annual Wellness Visit (AWV) in office.   If unable to come into the office for AWV,  please offer to do virtually or by telephone.  Last AWV: 04/04/2020  Please schedule at anytime with [facility] Health Advisor.  30 minute appointment for Virtual or phone 45 minute appointment for in office or Initial virtual/phone  Any questions, please contact me at [phone]

## 2021-12-17 NOTE — Telephone Encounter (Signed)
Requested medication (s) are due for refill today: yes  Requested medication (s) are on the active medication list: yes    Last refill: 09/15/21  #90  0 refills  Future visit scheduled no  Notes to clinic:Failed due to labs, please review. Thank you.  Requested Prescriptions  Pending Prescriptions Disp Refills   losartan-hydrochlorothiazide (HYZAAR) 50-12.5 MG tablet [Pharmacy Med Name: LOSARTAN-HCTZ 50-12.5 MG TAB] 90 tablet 0    Sig: TAKE 1 TABLET BY MOUTH EVERY DAY     Cardiovascular: ARB + Diuretic Combos Failed - 12/17/2021  2:40 AM      Failed - K in normal range and within 180 days    Potassium  Date Value Ref Range Status  01/30/2021 4.4 3.5 - 5.2 mmol/L Final  05/03/2012 3.7 3.5 - 5.1 mmol/L Final         Failed - Na in normal range and within 180 days    Sodium  Date Value Ref Range Status  01/30/2021 142 134 - 144 mmol/L Final  05/03/2012 138 136 - 145 mmol/L Final         Failed - Cr in normal range and within 180 days    Creatinine  Date Value Ref Range Status  05/03/2012 0.74 0.60 - 1.30 mg/dL Final   Creatinine, Ser  Date Value Ref Range Status  01/30/2021 0.68 0.57 - 1.00 mg/dL Final         Failed - eGFR is 10 or above and within 180 days    EGFR (African American)  Date Value Ref Range Status  05/03/2012 >60  Final   GFR calc Af Amer  Date Value Ref Range Status  11/16/2019 85 >59 mL/min/1.73 Final    Comment:    **[facility] currently reports eGFR in compliance with the current**   recommendations of the [employer]. [facility] will   update reporting as new guidelines are published from the NKF-ASN   Task force.    EGFR (Non-African Amer.)  Date Value Ref Range Status  05/03/2012 >60  Final    Comment:    eGFR values <64m/min/1.73 m2 may be an indication of chronic kidney disease (CKD). Calculated eGFR is useful in patients with stable renal function. The eGFR calculation will not be reliable in acutely ill patients when  serum creatinine is changing rapidly. It is not useful in  patients on dialysis. The eGFR calculation may not be applicable to patients at the low and high extremes of body sizes, pregnant women, and vegetarians.    GFR calc non Af Amer  Date Value Ref Range Status  11/16/2019 74 >59 mL/min/1.73 Final   eGFR  Date Value Ref Range Status  01/30/2021 91 >59 mL/min/1.73 Final         Failed - Last BP in normal range    BP Readings from Last 1 Encounters:  09/12/21 (!) 153/81         Passed - Patient is not pregnant      Passed - Valid encounter within last 6 months    Recent Outpatient Visits           3 months ago Hypertension, unspecified type   [facility] [place] PA-C   3 months ago Bilateral impacted cerumen   [facility]   10 months ago Bilateral impacted cerumen   [facility]   10 months ago Chronic idiopathic constipation   [facility]   1 year ago Essential hypertension   [place], [place]

## 2021-12-23 DIAGNOSIS — G4733 Obstructive sleep apnea (adult) (pediatric): Secondary | ICD-10-CM | POA: Diagnosis not present

## 2022-01-05 ENCOUNTER — Telehealth: Payer: Self-pay | Admitting: Family Medicine

## 2022-01-05 NOTE — Telephone Encounter (Signed)
Copied from [place] [id number]. Topic: Medicare AWV >> Jan 05, 2022  2:27 PM Jae Dire wrote: Reason for CRM:  Ilona Sorrel tto VM  Left message for patient to call back and schedule Medicare Annual Wellness Visit (AWV) in office.   If unable to come into the office for AWV,  please offer to do virtually or by telephone.  Last AWV:  04/04/2020  Please schedule at anytime with [facility] Health Advisor.  30 minute appointment for Virtual or phone 45 minute appointment for in office or Initial virtual/phone  Any questions, please contact me at [phone]

## 2022-01-17 ENCOUNTER — Other Ambulatory Visit: Payer: Self-pay | Admitting: Family Medicine

## 2022-01-17 DIAGNOSIS — I10 Essential (primary) hypertension: Secondary | ICD-10-CM

## 2022-01-19 NOTE — Telephone Encounter (Signed)
Requested Prescriptions  Pending Prescriptions Disp Refills  . metoprolol succinate (TOPROL-XL) 100 MG 24 hr tablet [Pharmacy Med Name: METOPROLOL SUCC ER 100 MG TAB] 90 tablet 0    Sig: TAKE 1 TABLET BY MOUTH DAILY. TAKE WITH OR IMMEDIATELY FOLLOWING A MEAL.     Cardiovascular:  Beta Blockers Failed - 01/17/2022  1:31 AM      Failed - Last BP in normal range    BP Readings from Last 1 Encounters:  09/12/21 (!) 153/81         Passed - Last Heart Rate in normal range    Pulse Readings from Last 1 Encounters:  09/12/21 64         Passed - Valid encounter within last 6 months    Recent Outpatient Visits          4 months ago Hypertension, unspecified type   [facility] [place], [place], PA-C   4 months ago Bilateral impacted cerumen   [facility] [place], [place], PA-C   11 months ago Bilateral impacted cerumen   [facility] [place], [place], PA-C   11 months ago Chronic idiopathic constipation   [facility] Gwyneth Sprout, FNP   1 year ago Essential hypertension   [place], Vickki Muff, [place]             Patient will need an office visit for further refills.

## 2022-02-04 ENCOUNTER — Telehealth: Payer: Self-pay | Admitting: Family Medicine

## 2022-02-04 NOTE — Telephone Encounter (Signed)
Left message for patient to call back and schedule Medicare Annual Wellness Visit (AWV) in office.   If not able to come in office, please offer to do virtually or by telephone.  Left office number and my jabber [phone].  Last AWV:04/04/2020   Please schedule at anytime with Nurse Health Advisor.

## 2022-02-19 ENCOUNTER — Telehealth: Payer: Self-pay | Admitting: Family Medicine

## 2022-02-19 DIAGNOSIS — M159 Polyosteoarthritis, unspecified: Secondary | ICD-10-CM

## 2022-02-19 DIAGNOSIS — K219 Gastro-esophageal reflux disease without esophagitis: Secondary | ICD-10-CM

## 2022-02-19 DIAGNOSIS — M48062 Spinal stenosis, lumbar region with neurogenic claudication: Secondary | ICD-10-CM | POA: Diagnosis not present

## 2022-02-19 DIAGNOSIS — I10 Essential (primary) hypertension: Secondary | ICD-10-CM

## 2022-02-19 DIAGNOSIS — E039 Hypothyroidism, unspecified: Secondary | ICD-10-CM

## 2022-02-19 MED ORDER — LEVOTHYROXINE SODIUM 88 MCG PO TABS: 88.0000 ug | ORAL_TABLET | Freq: Every day | ORAL | 2 refills | Status: DC

## 2022-02-19 MED ORDER — LOSARTAN POTASSIUM-HCTZ 50-12.5 MG PO TABS: 1.0000 | ORAL_TABLET | Freq: Every day | ORAL | 0 refills | Status: DC

## 2022-02-19 MED ORDER — MELOXICAM 15 MG PO TABS: 15.0000 mg | ORAL_TABLET | Freq: Every day | ORAL | 0 refills | Status: DC

## 2022-02-19 MED ORDER — METOPROLOL SUCCINATE ER 100 MG PO TB24: 100.0000 mg | ORAL_TABLET | Freq: Every day | ORAL | 0 refills | Status: DC

## 2022-02-19 MED ORDER — OMEPRAZOLE 20 MG PO CPDR: DELAYED_RELEASE_CAPSULE | ORAL | 1 refills | Status: DC

## 2022-02-19 NOTE — Telephone Encounter (Signed)
[facility] the following medications:   meloxicam (MOBIC) 15 MG tablet    levothyroxine (SYNTHROID) 88 MCG tablet    metoprolol succinate (TOPROL-XL) 100 MG 24 hr tablet    losartan-hydrochlorothiazide (HYZAAR) 50-12.5 MG tablet   Please advise.

## 2022-02-19 NOTE — Telephone Encounter (Signed)
[facility] the following medications:    omeprazole (PRILOSEC) 20 MG capsule    Please advise

## 2022-02-26 DIAGNOSIS — K5900 Constipation, unspecified: Secondary | ICD-10-CM | POA: Diagnosis not present

## 2022-02-26 DIAGNOSIS — I10 Essential (primary) hypertension: Secondary | ICD-10-CM | POA: Diagnosis not present

## 2022-02-26 DIAGNOSIS — E039 Hypothyroidism, unspecified: Secondary | ICD-10-CM | POA: Diagnosis not present

## 2022-03-03 DIAGNOSIS — R32 Unspecified urinary incontinence: Secondary | ICD-10-CM | POA: Diagnosis not present

## 2022-03-03 DIAGNOSIS — M5136 Other intervertebral disc degeneration, lumbar region: Secondary | ICD-10-CM | POA: Diagnosis not present

## 2022-03-03 DIAGNOSIS — E785 Hyperlipidemia, unspecified: Secondary | ICD-10-CM | POA: Diagnosis not present

## 2022-03-03 DIAGNOSIS — G4733 Obstructive sleep apnea (adult) (pediatric): Secondary | ICD-10-CM | POA: Diagnosis not present

## 2022-03-03 DIAGNOSIS — E669 Obesity, unspecified: Secondary | ICD-10-CM | POA: Diagnosis not present

## 2022-03-03 DIAGNOSIS — K219 Gastro-esophageal reflux disease without esophagitis: Secondary | ICD-10-CM | POA: Diagnosis not present

## 2022-03-03 DIAGNOSIS — G4700 Insomnia, unspecified: Secondary | ICD-10-CM | POA: Diagnosis not present

## 2022-03-03 DIAGNOSIS — E039 Hypothyroidism, unspecified: Secondary | ICD-10-CM | POA: Diagnosis not present

## 2022-03-03 DIAGNOSIS — G8929 Other chronic pain: Secondary | ICD-10-CM | POA: Diagnosis not present

## 2022-03-03 DIAGNOSIS — I10 Essential (primary) hypertension: Secondary | ICD-10-CM | POA: Diagnosis not present

## 2022-03-03 DIAGNOSIS — M1990 Unspecified osteoarthritis, unspecified site: Secondary | ICD-10-CM | POA: Diagnosis not present

## 2022-03-04 ENCOUNTER — Telehealth: Payer: Self-pay | Admitting: Family Medicine

## 2022-03-04 NOTE — Telephone Encounter (Signed)
Left message for patient to call back and schedule Medicare Annual Wellness Visit (AWV) in office.   If not able to come in office, please offer to do virtually or by telephone.  Left office number and my jabber [phone].  Last AWV:04/04/2020   Please schedule at anytime with Nurse Health Advisor.

## 2022-03-23 ENCOUNTER — Other Ambulatory Visit: Payer: Self-pay | Admitting: Family Medicine

## 2022-03-23 DIAGNOSIS — I10 Essential (primary) hypertension: Secondary | ICD-10-CM

## 2022-03-26 DIAGNOSIS — R531 Weakness: Secondary | ICD-10-CM | POA: Diagnosis not present

## 2022-03-26 DIAGNOSIS — R32 Unspecified urinary incontinence: Secondary | ICD-10-CM | POA: Diagnosis not present

## 2022-03-26 DIAGNOSIS — F3341 Major depressive disorder, recurrent, in partial remission: Secondary | ICD-10-CM | POA: Diagnosis not present

## 2022-03-26 DIAGNOSIS — G250 Essential tremor: Secondary | ICD-10-CM | POA: Diagnosis not present

## 2022-03-26 DIAGNOSIS — G4733 Obstructive sleep apnea (adult) (pediatric): Secondary | ICD-10-CM | POA: Diagnosis not present

## 2022-03-30 ENCOUNTER — Telehealth: Payer: Self-pay | Admitting: Family Medicine

## 2022-03-30 NOTE — Telephone Encounter (Signed)
LVM for pt to rtn my call to schedule AWV with [facility] call back # [phone] 

## 2022-04-02 DIAGNOSIS — H6123 Impacted cerumen, bilateral: Secondary | ICD-10-CM | POA: Diagnosis not present

## 2022-04-17 DIAGNOSIS — M3501 Sicca syndrome with keratoconjunctivitis: Secondary | ICD-10-CM | POA: Diagnosis not present

## 2022-04-17 DIAGNOSIS — H43813 Vitreous degeneration, bilateral: Secondary | ICD-10-CM | POA: Diagnosis not present

## 2022-04-17 DIAGNOSIS — Z961 Presence of intraocular lens: Secondary | ICD-10-CM | POA: Diagnosis not present

## 2022-04-17 DIAGNOSIS — H35379 Puckering of macula, unspecified eye: Secondary | ICD-10-CM | POA: Diagnosis not present

## 2022-04-18 ENCOUNTER — Other Ambulatory Visit: Payer: Self-pay | Admitting: Family Medicine

## 2022-04-18 DIAGNOSIS — I10 Essential (primary) hypertension: Secondary | ICD-10-CM

## 2022-04-20 NOTE — Telephone Encounter (Signed)
Requested medication (s) are due for refill today: yes  Requested medication (s) are on the active medication list: yes  Last refill:  02/19/21  Future visit scheduled: no  Notes to clinic:  Unable to refill per protocol, courtesy refill already given, routing for provider approval.      Requested Prescriptions  Pending Prescriptions Disp Refills   metoprolol succinate (TOPROL-XL) 100 MG 24 hr tablet [Pharmacy Med Name: METOPROLOL SUCC ER 100 MG TAB] 90 tablet 0    Sig: TAKE 1 TABLET BY MOUTH EVERY DAY WITH OR IMMEDIATELY FOLLOWING A MEAL     Cardiovascular:  Beta Blockers Failed - 04/18/2022  1:00 AM      Failed - Last BP in normal range    BP Readings from Last 1 Encounters:  09/12/21 (!) 153/81         Failed - Valid encounter within last 6 months    Recent Outpatient Visits           7 months ago Hypertension, unspecified type   [place], [place], PA-C   7 months ago Bilateral impacted cerumen   [place], [place], PA-C   1 year ago Bilateral impacted cerumen   [place] Mikey Kirschner, PA-C   1 year ago Chronic idiopathic constipation   [place] Gwyneth Sprout, FNP   1 year ago Essential hypertension   [place], [place]       Future Appointments             Tomorrow Hollice Espy, [facility] [place]            Passed - Last Heart Rate in normal range    Pulse Readings from Last 1 Encounters:  09/12/21 64

## 2022-04-21 ENCOUNTER — Ambulatory Visit: Payer: PPO | Admitting: Urology

## 2022-05-07 DIAGNOSIS — R531 Weakness: Secondary | ICD-10-CM | POA: Diagnosis not present

## 2022-05-07 DIAGNOSIS — G8929 Other chronic pain: Secondary | ICD-10-CM | POA: Diagnosis not present

## 2022-05-07 DIAGNOSIS — R42 Dizziness and giddiness: Secondary | ICD-10-CM | POA: Diagnosis not present

## 2022-05-07 DIAGNOSIS — R251 Tremor, unspecified: Secondary | ICD-10-CM | POA: Diagnosis not present

## 2022-05-07 DIAGNOSIS — M545 Low back pain, unspecified: Secondary | ICD-10-CM | POA: Diagnosis not present

## 2022-05-15 DIAGNOSIS — G4733 Obstructive sleep apnea (adult) (pediatric): Secondary | ICD-10-CM | POA: Diagnosis not present

## 2022-05-20 ENCOUNTER — Ambulatory Visit: Payer: PPO | Admitting: Urology

## 2022-06-11 ENCOUNTER — Ambulatory Visit: Payer: PPO | Admitting: Urology

## 2022-06-22 ENCOUNTER — Ambulatory Visit: Payer: PPO | Admitting: Urology

## 2022-06-22 VITALS — BP 150/99 | HR 63 | Ht 65.5 in | Wt 242.0 lb

## 2022-06-22 DIAGNOSIS — N3946 Mixed incontinence: Secondary | ICD-10-CM | POA: Diagnosis not present

## 2022-06-22 DIAGNOSIS — R32 Unspecified urinary incontinence: Secondary | ICD-10-CM | POA: Diagnosis not present

## 2022-06-22 LAB — URINALYSIS, COMPLETE
Bilirubin, UA: NEGATIVE
Glucose, UA: NEGATIVE
Nitrite, UA: NEGATIVE
Protein,UA: NEGATIVE
RBC, UA: NEGATIVE
Specific Gravity, UA: 1.025 (ref 1.005–1.030)
Urobilinogen, Ur: 0.2 mg/dL (ref 0.2–1.0)
pH, UA: 6 (ref 5.0–7.5)

## 2022-06-22 LAB — MICROSCOPIC EXAMINATION

## 2022-06-22 MED ORDER — MIRABEGRON ER 25 MG PO TB24: 25.0000 mg | ORAL_TABLET | Freq: Every day | ORAL | 0 refills | Status: DC

## 2022-06-22 NOTE — Patient Instructions (Signed)

## 2022-06-22 NOTE — Progress Notes (Signed)
06/22/2022 2:55 PM   Robin Espinoza November 16, 1945 [id number]  Referring provider: Gwyneth Sprout, [place],  [place] [address]  No chief complaint on file.   HPI: I was consulted to assess the patient is urinary incontinence.  She has urgency incontinence.  She leaks with coughing sneezing bending and lifting.  She said all the components are significant.  She has variable severity of bedwetting but double pads in the middle of the night.  She wears 2 pull-ups during the day that can be moderately wet.  She is slow and uses a walker.  She voids every 1 hour and cannot hold it for 2 hours.  She has no nocturia.  Some days flow was good sometimes poor  No hysterectomy  No history of kidney stones bladder surgery or bladder infections.  No neurologic issues.  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 If you have any questions, ask your nurse or doctor.          acetaminophen 650 MG CR tablet Commonly known as: TYLENOL Take 1,300 mg  by mouth every 8 (eight) hours as needed for pain.   lactulose 10 GM/15ML solution Commonly known as: CHRONULAC Take 45 mLs (30 g total) by mouth daily as needed for mild constipation.   levothyroxine 88 MCG tablet Commonly known as: SYNTHROID Take 1 tablet (88 mcg total) by mouth daily.   losartan-hydrochlorothiazide 50-12.5 MG tablet Commonly known as: HYZAAR TAKE 1 TABLET BY MOUTH EVERY DAY   lovastatin 40 MG tablet Commonly known as: MEVACOR TAKE 1 TABLET BY MOUTH EVERYDAY AT BEDTIME   meloxicam 15 MG tablet Commonly known as: MOBIC Take 1 tablet (15 mg total) by mouth daily.   metoprolol succinate 100 MG 24 hr tablet Commonly known as: TOPROL-XL Take 1 tablet (100 mg total) by mouth daily. Please schedule office visit before any future refill.   NON FORMULARY CPAP   omeprazole 20 MG capsule Commonly known as: PRILOSEC TAKE 1 CAPSULE BY MOUTH EVERY DAY   polyethylene glycol powder 17 GM/SCOOP powder Commonly known as: GLYCOLAX/MIRALAX Take 510 g by mouth daily.   rOPINIRole 1 MG tablet Commonly known as: REQUIP TAKE 1 TABLET BY MOUTH EVERYDAY AT BEDTIME   senna 8.6 MG tablet Commonly known as: [place] [employer]  Take 2 tablets (17.2 mg total) by mouth at bedtime.   THERATEARS OP Place 1 drop into both eyes daily as needed (dry eyes).   traMADol 50 MG tablet Commonly known as: ULTRAM Take 50 mg by mouth 2 (two) times daily as needed.   traZODone 50 MG tablet Commonly known as: DESYREL Take 2 tablets (100 mg total) by mouth daily.   venlafaxine XR 75 MG 24 hr capsule Commonly known as: EFFEXOR-XR TAKE 1 CAPSULE BY MOUTH DAILY WITH BREAKFAST.   Vitamin D (Ergocalciferol) 1.25 MG (50000 UNIT) Caps capsule Commonly known as: DRISDOL TAKE 1 CAPSULE (50,000 UNITS TOTAL) BY MOUTH EVERY 7 (SEVEN) DAYS   vitamin D3 25 MCG (1000 UT) tablet Generic drug: Cholecalciferol Take 1,000 Units by mouth daily.        Allergies:  Allergies  Allergen Reactions    Amlodipine     Ankle swelling    Oxycodone Other (See Comments)    hallucinated    Family History: Family History  Problem Relation Age of Onset   Diabetes Father    Breast cancer Neg Hx     Social History:  reports that she has quit smoking. She has never used smokeless tobacco. She reports current alcohol use. She reports that she does not use drugs.  ROS:                                        Physical Exam: There were no vitals taken for this visit.  Constitutional:  Alert and oriented, No acute distress. HEENT: [place] AT, moist mucus membranes.  Trachea midline, no masses.   Laboratory Data: Lab Results  Component Value Date   WBC 7.4 01/30/2021   HGB 14.6 01/30/2021   HCT 43.9 01/30/2021   MCV 96 01/30/2021   PLT 201 01/30/2021    Lab Results  Component Value Date   CREATININE 0.68 01/30/2021    No results found for: "PSA"  No results found for: "TESTOSTERONE"  Lab Results  Component Value Date   HGBA1C 5.5 01/30/2021    Urinalysis    Component Value Date/Time   COLORURINE YELLOW (A) 09/22/2019 1143   APPEARANCEUR CLEAR (A) 09/22/2019 1143   APPEARANCEUR Cloudy 05/02/2012 1036   LABSPEC 1.009 09/22/2019 1143   LABSPEC 1.020 05/02/2012 1036   PHURINE 5.0 09/22/2019 1143   GLUCOSEU NEGATIVE 09/22/2019 1143   GLUCOSEU Negative 05/02/2012 1036   HGBUR NEGATIVE 09/22/2019 1143   BILIRUBINUR NEGATIVE 09/22/2019 1143   BILIRUBINUR positive 08/22/2019 1605   BILIRUBINUR Negative 05/02/2012 1036   KETONESUR NEGATIVE 09/22/2019 1143   PROTEINUR NEGATIVE 09/22/2019 1143   UROBILINOGEN 0.2 08/22/2019 1605   NITRITE NEGATIVE 09/22/2019 1143   LEUKOCYTESUR TRACE (A) 09/22/2019 1143   LEUKOCYTESUR 3+ 05/02/2012 1036    Pertinent Imaging: Urine positive.  Urine sent for culture.  Chart reviewed  Assessment & Plan: Patient has mixed incontinence with bedwetting and a modest functional component.  She has frequency.  I will call if  urine culture is positive.  Return in 6 weeks on Myrbetriq 25 mg samples and prescription for pelvic examination and cystoscopy.  I may or may not order urodynamics in the future and did not bring up the test today.  The interesting see if the culture is positive.  She understands I will order urodynamics in the future depending on her treatment course.  She mention Botox and understands that she  needs to fail medications prior  1. Urinary incontinence, unspecified type  - Urinalysis, Complete - BLADDER SCAN AMB NON-IMAGING   No follow-ups on file.  Reece Packer, MD  [place] [address], [place], [place] [address] [phone]

## 2022-06-23 ENCOUNTER — Other Ambulatory Visit: Payer: Self-pay | Admitting: Family Medicine

## 2022-06-23 DIAGNOSIS — I10 Essential (primary) hypertension: Secondary | ICD-10-CM

## 2022-06-23 NOTE — Telephone Encounter (Signed)
Requested medication (s) are due for refill today - yes  Requested medication (s) are on the active medication list -yes  Future visit scheduled -no  Last refill: 03/24/22 #90  Notes to clinic: sent for review- not sure patient is still coming to practice  Requested Prescriptions  Pending Prescriptions Disp Refills   losartan-hydrochlorothiazide (HYZAAR) 50-12.5 MG tablet [Pharmacy Med Name: LOSARTAN-HCTZ 50-12.5 MG TAB] 90 tablet 0    Sig: TAKE 1 TABLET BY MOUTH EVERY DAY     Cardiovascular: ARB + Diuretic Combos Failed - 06/23/2022  1:33 PM      Failed - K in normal range and within 180 days    Potassium  Date Value Ref Range Status  01/30/2021 4.4 3.5 - 5.2 mmol/L Final  05/03/2012 3.7 3.5 - 5.1 mmol/L Final         Failed - Na in normal range and within 180 days    Sodium  Date Value Ref Range Status  01/30/2021 142 134 - 144 mmol/L Final  05/03/2012 138 136 - 145 mmol/L Final         Failed - Cr in normal range and within 180 days    Creatinine  Date Value Ref Range Status  05/03/2012 0.74 0.60 - 1.30 mg/dL Final   Creatinine, Ser  Date Value Ref Range Status  01/30/2021 0.68 0.57 - 1.00 mg/dL Final         Failed - eGFR is 10 or above and within 180 days    EGFR (African American)  Date Value Ref Range Status  05/03/2012 >60  Final   GFR calc Af Amer  Date Value Ref Range Status  11/16/2019 85 >59 mL/min/1.73 Final    Comment:    **[facility] currently reports eGFR in compliance with the current**   recommendations of the [employer]. [facility] will   update reporting as new guidelines are published from the NKF-ASN   Task force.    EGFR (Non-African Amer.)  Date Value Ref Range Status  05/03/2012 >60  Final    Comment:    eGFR values <8mL/min/1.73 m2 may be an indication of chronic kidney disease (CKD). Calculated eGFR is useful in patients with stable renal function. The eGFR calculation will not be reliable in acutely ill patients when  serum creatinine is changing rapidly. It is not useful in  patients on dialysis. The eGFR calculation may not be applicable to patients at the low and high extremes of body sizes, pregnant women, and vegetarians.    GFR calc non Af Amer  Date Value Ref Range Status  11/16/2019 74 >59 mL/min/1.73 Final   eGFR  Date Value Ref Range Status  01/30/2021 91 >59 mL/min/1.73 Final         Failed - Last BP in normal range    BP Readings from Last 1 Encounters:  06/22/22 (!) 150/99         Failed - Valid encounter within last 6 months    Recent Outpatient Visits           9 months ago Hypertension, unspecified type   [place], [place], PA-C   9 months ago Bilateral impacted cerumen   [place], [place], PA-C   1 year ago Bilateral impacted cerumen   [place] Mikey Kirschner, PA-C   1 year ago Chronic idiopathic constipation   [place] Gwyneth Sprout, FNP   1 year ago Essential  hypertension   [place], Vickki Muff, [place]              Passed - Patient is not pregnant         Requested Prescriptions  Pending Prescriptions Disp Refills   losartan-hydrochlorothiazide (HYZAAR) 50-12.5 MG tablet [Pharmacy Med Name: LOSARTAN-HCTZ 50-12.5 MG TAB] 90 tablet 0    Sig: TAKE 1 TABLET BY MOUTH EVERY DAY     Cardiovascular: ARB + Diuretic Combos Failed - 06/23/2022  1:33 PM      Failed - K in normal range and within 180 days    Potassium  Date Value Ref Range Status  01/30/2021 4.4 3.5 - 5.2 mmol/L Final  05/03/2012 3.7 3.5 - 5.1 mmol/L Final         Failed - Na in normal range and within 180 days    Sodium  Date Value Ref Range Status  01/30/2021 142 134 - 144 mmol/L Final  05/03/2012 138 136 - 145 mmol/L Final         Failed - Cr in normal range and within 180 days    Creatinine  Date Value Ref Range Status   05/03/2012 0.74 0.60 - 1.30 mg/dL Final   Creatinine, Ser  Date Value Ref Range Status  01/30/2021 0.68 0.57 - 1.00 mg/dL Final         Failed - eGFR is 10 or above and within 180 days    EGFR (African American)  Date Value Ref Range Status  05/03/2012 >60  Final   GFR calc Af Amer  Date Value Ref Range Status  11/16/2019 85 >59 mL/min/1.73 Final    Comment:    **[facility] currently reports eGFR in compliance with the current**   recommendations of the [employer]. [facility] will   update reporting as new guidelines are published from the NKF-ASN   Task force.    EGFR (Non-African Amer.)  Date Value Ref Range Status  05/03/2012 >60  Final    Comment:    eGFR values <54mL/min/1.73 m2 may be an indication of chronic kidney disease (CKD). Calculated eGFR is useful in patients with stable renal function. The eGFR calculation will not be reliable in acutely ill patients when serum creatinine is changing rapidly. It is not useful in  patients on dialysis. The eGFR calculation may not be applicable to patients at the low and high extremes of body sizes, pregnant women, and vegetarians.    GFR calc non Af Amer  Date Value Ref Range Status  11/16/2019 74 >59 mL/min/1.73 Final   eGFR  Date Value Ref Range Status  01/30/2021 91 >59 mL/min/1.73 Final         Failed - Last BP in normal range    BP Readings from Last 1 Encounters:  06/22/22 (!) 150/99         Failed - Valid encounter within last 6 months    Recent Outpatient Visits           9 months ago Hypertension, unspecified type   [place], [place], PA-C   9 months ago Bilateral impacted cerumen   [place], [place], PA-C   1 year ago Bilateral impacted cerumen   [place] Mikey Kirschner, PA-C   1 year ago Chronic idiopathic constipation   [place] Gwyneth Sprout, FNP   1  year ago Essential hypertension   [facility] [place], Vickki Muff, PA-C              Passed - Patient is not pregnant

## 2022-06-25 LAB — CULTURE, URINE COMPREHENSIVE

## 2022-07-13 ENCOUNTER — Other Ambulatory Visit: Payer: Self-pay | Admitting: Family Medicine

## 2022-07-13 DIAGNOSIS — I10 Essential (primary) hypertension: Secondary | ICD-10-CM

## 2022-07-22 DIAGNOSIS — R251 Tremor, unspecified: Secondary | ICD-10-CM | POA: Diagnosis not present

## 2022-07-22 DIAGNOSIS — R531 Weakness: Secondary | ICD-10-CM | POA: Diagnosis not present

## 2022-07-22 DIAGNOSIS — M545 Low back pain, unspecified: Secondary | ICD-10-CM | POA: Diagnosis not present

## 2022-07-22 DIAGNOSIS — R42 Dizziness and giddiness: Secondary | ICD-10-CM | POA: Diagnosis not present

## 2022-07-22 DIAGNOSIS — G8929 Other chronic pain: Secondary | ICD-10-CM | POA: Diagnosis not present

## 2022-07-23 DIAGNOSIS — H6123 Impacted cerumen, bilateral: Secondary | ICD-10-CM | POA: Diagnosis not present

## 2022-07-23 DIAGNOSIS — H903 Sensorineural hearing loss, bilateral: Secondary | ICD-10-CM | POA: Diagnosis not present

## 2022-08-05 DIAGNOSIS — M4800 Spinal stenosis, site unspecified: Secondary | ICD-10-CM | POA: Diagnosis not present

## 2022-08-05 DIAGNOSIS — E7800 Pure hypercholesterolemia, unspecified: Secondary | ICD-10-CM | POA: Diagnosis not present

## 2022-08-05 DIAGNOSIS — I10 Essential (primary) hypertension: Secondary | ICD-10-CM | POA: Diagnosis not present

## 2022-08-05 DIAGNOSIS — K5900 Constipation, unspecified: Secondary | ICD-10-CM | POA: Diagnosis not present

## 2022-08-05 DIAGNOSIS — M4150 Other secondary scoliosis, site unspecified: Secondary | ICD-10-CM | POA: Diagnosis not present

## 2022-08-05 DIAGNOSIS — G8929 Other chronic pain: Secondary | ICD-10-CM | POA: Diagnosis not present

## 2022-08-05 DIAGNOSIS — G4733 Obstructive sleep apnea (adult) (pediatric): Secondary | ICD-10-CM | POA: Diagnosis not present

## 2022-08-05 DIAGNOSIS — F419 Anxiety disorder, unspecified: Secondary | ICD-10-CM | POA: Diagnosis not present

## 2022-08-05 DIAGNOSIS — M545 Low back pain, unspecified: Secondary | ICD-10-CM | POA: Diagnosis not present

## 2022-08-05 DIAGNOSIS — Z6828 Body mass index (BMI) 28.0-28.9, adult: Secondary | ICD-10-CM | POA: Diagnosis not present

## 2022-08-05 DIAGNOSIS — G252 Other specified forms of tremor: Secondary | ICD-10-CM | POA: Diagnosis not present

## 2022-08-05 DIAGNOSIS — Z96653 Presence of artificial knee joint, bilateral: Secondary | ICD-10-CM | POA: Diagnosis not present

## 2022-08-05 DIAGNOSIS — E039 Hypothyroidism, unspecified: Secondary | ICD-10-CM | POA: Diagnosis not present

## 2022-08-05 DIAGNOSIS — K219 Gastro-esophageal reflux disease without esophagitis: Secondary | ICD-10-CM | POA: Diagnosis not present

## 2022-08-05 DIAGNOSIS — Z791 Long term (current) use of non-steroidal anti-inflammatories (NSAID): Secondary | ICD-10-CM | POA: Diagnosis not present

## 2022-08-05 DIAGNOSIS — M159 Polyosteoarthritis, unspecified: Secondary | ICD-10-CM | POA: Diagnosis not present

## 2022-08-05 DIAGNOSIS — Z9181 History of falling: Secondary | ICD-10-CM | POA: Diagnosis not present

## 2022-08-10 DIAGNOSIS — E039 Hypothyroidism, unspecified: Secondary | ICD-10-CM | POA: Diagnosis not present

## 2022-08-10 DIAGNOSIS — G8929 Other chronic pain: Secondary | ICD-10-CM | POA: Diagnosis not present

## 2022-08-10 DIAGNOSIS — K219 Gastro-esophageal reflux disease without esophagitis: Secondary | ICD-10-CM | POA: Diagnosis not present

## 2022-08-10 DIAGNOSIS — M159 Polyosteoarthritis, unspecified: Secondary | ICD-10-CM | POA: Diagnosis not present

## 2022-08-10 DIAGNOSIS — G4733 Obstructive sleep apnea (adult) (pediatric): Secondary | ICD-10-CM | POA: Diagnosis not present

## 2022-08-10 DIAGNOSIS — M4150 Other secondary scoliosis, site unspecified: Secondary | ICD-10-CM | POA: Diagnosis not present

## 2022-08-10 DIAGNOSIS — K5900 Constipation, unspecified: Secondary | ICD-10-CM | POA: Diagnosis not present

## 2022-08-10 DIAGNOSIS — E7800 Pure hypercholesterolemia, unspecified: Secondary | ICD-10-CM | POA: Diagnosis not present

## 2022-08-10 DIAGNOSIS — Z96653 Presence of artificial knee joint, bilateral: Secondary | ICD-10-CM | POA: Diagnosis not present

## 2022-08-10 DIAGNOSIS — Z791 Long term (current) use of non-steroidal anti-inflammatories (NSAID): Secondary | ICD-10-CM | POA: Diagnosis not present

## 2022-08-10 DIAGNOSIS — F419 Anxiety disorder, unspecified: Secondary | ICD-10-CM | POA: Diagnosis not present

## 2022-08-10 DIAGNOSIS — Z6828 Body mass index (BMI) 28.0-28.9, adult: Secondary | ICD-10-CM | POA: Diagnosis not present

## 2022-08-10 DIAGNOSIS — Z9181 History of falling: Secondary | ICD-10-CM | POA: Diagnosis not present

## 2022-08-10 DIAGNOSIS — M4800 Spinal stenosis, site unspecified: Secondary | ICD-10-CM | POA: Diagnosis not present

## 2022-08-10 DIAGNOSIS — G252 Other specified forms of tremor: Secondary | ICD-10-CM | POA: Diagnosis not present

## 2022-08-10 DIAGNOSIS — I10 Essential (primary) hypertension: Secondary | ICD-10-CM | POA: Diagnosis not present

## 2022-08-10 DIAGNOSIS — M545 Low back pain, unspecified: Secondary | ICD-10-CM | POA: Diagnosis not present

## 2022-08-11 ENCOUNTER — Other Ambulatory Visit: Payer: Self-pay

## 2022-08-11 DIAGNOSIS — E7800 Pure hypercholesterolemia, unspecified: Secondary | ICD-10-CM | POA: Diagnosis not present

## 2022-08-11 DIAGNOSIS — G252 Other specified forms of tremor: Secondary | ICD-10-CM | POA: Diagnosis not present

## 2022-08-11 DIAGNOSIS — Z9181 History of falling: Secondary | ICD-10-CM | POA: Diagnosis not present

## 2022-08-11 DIAGNOSIS — Z96653 Presence of artificial knee joint, bilateral: Secondary | ICD-10-CM | POA: Diagnosis not present

## 2022-08-11 DIAGNOSIS — Z6828 Body mass index (BMI) 28.0-28.9, adult: Secondary | ICD-10-CM | POA: Diagnosis not present

## 2022-08-11 DIAGNOSIS — E039 Hypothyroidism, unspecified: Secondary | ICD-10-CM | POA: Diagnosis not present

## 2022-08-11 DIAGNOSIS — K219 Gastro-esophageal reflux disease without esophagitis: Secondary | ICD-10-CM | POA: Diagnosis not present

## 2022-08-11 DIAGNOSIS — I10 Essential (primary) hypertension: Secondary | ICD-10-CM | POA: Diagnosis not present

## 2022-08-11 DIAGNOSIS — K5900 Constipation, unspecified: Secondary | ICD-10-CM | POA: Diagnosis not present

## 2022-08-11 DIAGNOSIS — M545 Low back pain, unspecified: Secondary | ICD-10-CM | POA: Diagnosis not present

## 2022-08-11 DIAGNOSIS — G4733 Obstructive sleep apnea (adult) (pediatric): Secondary | ICD-10-CM | POA: Diagnosis not present

## 2022-08-11 DIAGNOSIS — G8929 Other chronic pain: Secondary | ICD-10-CM | POA: Diagnosis not present

## 2022-08-11 DIAGNOSIS — M4800 Spinal stenosis, site unspecified: Secondary | ICD-10-CM | POA: Diagnosis not present

## 2022-08-11 DIAGNOSIS — Z791 Long term (current) use of non-steroidal anti-inflammatories (NSAID): Secondary | ICD-10-CM | POA: Diagnosis not present

## 2022-08-11 DIAGNOSIS — F419 Anxiety disorder, unspecified: Secondary | ICD-10-CM | POA: Diagnosis not present

## 2022-08-11 DIAGNOSIS — M4150 Other secondary scoliosis, site unspecified: Secondary | ICD-10-CM | POA: Diagnosis not present

## 2022-08-11 DIAGNOSIS — M159 Polyosteoarthritis, unspecified: Secondary | ICD-10-CM | POA: Diagnosis not present

## 2022-08-11 MED ORDER — MIRABEGRON ER 25 MG PO TB24: 25.0000 mg | ORAL_TABLET | Freq: Every day | ORAL | 0 refills | Status: DC

## 2022-08-11 NOTE — Telephone Encounter (Signed)
Received a call from [place] at [facility] stating patient reached out to them in regards to refilling her Myrbetriq medication. She has a week left and her follow up with [place] is on 08/24/22. Originally patient was given samples.  I called patient x 2 but had to leave a message. I left a message letting patient know we sent in prescription for the medication and to keep her follow up appointment. [facility] [address]

## 2022-08-13 DIAGNOSIS — F419 Anxiety disorder, unspecified: Secondary | ICD-10-CM | POA: Diagnosis not present

## 2022-08-13 DIAGNOSIS — M4150 Other secondary scoliosis, site unspecified: Secondary | ICD-10-CM | POA: Diagnosis not present

## 2022-08-13 DIAGNOSIS — G252 Other specified forms of tremor: Secondary | ICD-10-CM | POA: Diagnosis not present

## 2022-08-13 DIAGNOSIS — M4800 Spinal stenosis, site unspecified: Secondary | ICD-10-CM | POA: Diagnosis not present

## 2022-08-13 DIAGNOSIS — G4733 Obstructive sleep apnea (adult) (pediatric): Secondary | ICD-10-CM | POA: Diagnosis not present

## 2022-08-13 DIAGNOSIS — K219 Gastro-esophageal reflux disease without esophagitis: Secondary | ICD-10-CM | POA: Diagnosis not present

## 2022-08-13 DIAGNOSIS — I10 Essential (primary) hypertension: Secondary | ICD-10-CM | POA: Diagnosis not present

## 2022-08-19 DIAGNOSIS — G4733 Obstructive sleep apnea (adult) (pediatric): Secondary | ICD-10-CM | POA: Diagnosis not present

## 2022-08-20 DIAGNOSIS — H35379 Puckering of macula, unspecified eye: Secondary | ICD-10-CM | POA: Diagnosis not present

## 2022-08-20 DIAGNOSIS — G4733 Obstructive sleep apnea (adult) (pediatric): Secondary | ICD-10-CM | POA: Diagnosis not present

## 2022-08-20 DIAGNOSIS — M3501 Sicca syndrome with keratoconjunctivitis: Secondary | ICD-10-CM | POA: Diagnosis not present

## 2022-08-24 ENCOUNTER — Other Ambulatory Visit: Payer: PPO | Admitting: Urology

## 2022-08-24 DIAGNOSIS — E039 Hypothyroidism, unspecified: Secondary | ICD-10-CM | POA: Diagnosis not present

## 2022-08-24 DIAGNOSIS — I10 Essential (primary) hypertension: Secondary | ICD-10-CM | POA: Diagnosis not present

## 2022-08-31 DIAGNOSIS — F3341 Major depressive disorder, recurrent, in partial remission: Secondary | ICD-10-CM | POA: Diagnosis not present

## 2022-08-31 DIAGNOSIS — I10 Essential (primary) hypertension: Secondary | ICD-10-CM | POA: Diagnosis not present

## 2022-08-31 DIAGNOSIS — E039 Hypothyroidism, unspecified: Secondary | ICD-10-CM | POA: Diagnosis not present

## 2022-08-31 DIAGNOSIS — G4733 Obstructive sleep apnea (adult) (pediatric): Secondary | ICD-10-CM | POA: Diagnosis not present

## 2022-08-31 DIAGNOSIS — Z0000 Encounter for general adult medical examination without abnormal findings: Secondary | ICD-10-CM | POA: Diagnosis not present

## 2022-09-01 DIAGNOSIS — G4733 Obstructive sleep apnea (adult) (pediatric): Secondary | ICD-10-CM | POA: Diagnosis not present

## 2022-09-12 ENCOUNTER — Other Ambulatory Visit: Payer: Self-pay | Admitting: Family Medicine

## 2022-09-12 DIAGNOSIS — I10 Essential (primary) hypertension: Secondary | ICD-10-CM

## 2022-09-14 ENCOUNTER — Encounter: Payer: Self-pay | Admitting: Urology

## 2022-09-14 ENCOUNTER — Other Ambulatory Visit: Payer: PPO | Admitting: Urology

## 2022-09-14 NOTE — Telephone Encounter (Signed)
Unable to refill per protocol, no OV in a year, patient sees another PCP.  Requested Prescriptions  Pending Prescriptions Disp Refills   losartan-hydrochlorothiazide (HYZAAR) 50-12.5 MG tablet [Pharmacy Med Name: LOSARTAN-HCTZ 50-12.5 MG TAB] 90 tablet     Sig: TAKE 1 TABLET BY MOUTH EVERY DAY     Cardiovascular: ARB + Diuretic Combos Failed - 09/12/2022  1:01 AM      Failed - K in normal range and within 180 days    Potassium  Date Value Ref Range Status  01/30/2021 4.4 3.5 - 5.2 mmol/L Final  05/03/2012 3.7 3.5 - 5.1 mmol/L Final         Failed - Na in normal range and within 180 days    Sodium  Date Value Ref Range Status  01/30/2021 142 134 - 144 mmol/L Final  05/03/2012 138 136 - 145 mmol/L Final         Failed - Cr in normal range and within 180 days    Creatinine  Date Value Ref Range Status  05/03/2012 0.74 0.60 - 1.30 mg/dL Final   Creatinine, Ser  Date Value Ref Range Status  01/30/2021 0.68 0.57 - 1.00 mg/dL Final         Failed - eGFR is 10 or above and within 180 days    EGFR (African American)  Date Value Ref Range Status  05/03/2012 >60  Final   GFR calc Af Amer  Date Value Ref Range Status  11/16/2019 85 >59 mL/min/1.73 Final    Comment:    **[facility] currently reports eGFR in compliance with the current**   recommendations of the [employer]. [facility] will   update reporting as new guidelines are published from the NKF-ASN   Task force.    EGFR (Non-African Amer.)  Date Value Ref Range Status  05/03/2012 >60  Final    Comment:    eGFR values <53mL/min/1.73 m2 may be an indication of chronic kidney disease (CKD). Calculated eGFR is useful in patients with stable renal function. The eGFR calculation will not be reliable in acutely ill patients when serum creatinine is changing rapidly. It is not useful in  patients on dialysis. The eGFR calculation may not be applicable to patients at the low and high extremes of body sizes,  pregnant women, and vegetarians.    GFR calc non Af Amer  Date Value Ref Range Status  11/16/2019 74 >59 mL/min/1.73 Final   eGFR  Date Value Ref Range Status  01/30/2021 91 >59 mL/min/1.73 Final         Failed - Last BP in normal range    BP Readings from Last 1 Encounters:  06/22/22 (!) 150/99         Failed - Valid encounter within last 6 months    Recent Outpatient Visits           1 year ago Hypertension, unspecified type   [facility] [place], [place], PA-C   1 year ago Bilateral impacted cerumen   [place] [facility] [place], [place], PA-C   1 year ago Bilateral impacted cerumen   [facility] Alfredia [place], PA-C   1 year ago Chronic idiopathic constipation   [facility] Jacky Kindle, FNP   2 years ago Essential hypertension   [facility] Chrismon, [place], [place]              Passed - Patient is not pregnant

## 2022-09-16 ENCOUNTER — Other Ambulatory Visit: Payer: Self-pay

## 2022-09-16 ENCOUNTER — Emergency Department: Payer: PPO

## 2022-09-16 ENCOUNTER — Inpatient Hospital Stay
Admission: EM | Admit: 2022-09-16 | Discharge: 2022-09-23 | DRG: 308 | Disposition: A | Discharge status: Skilled Nursing Facility | Payer: PPO | Source: Ambulatory Visit | Attending: Internal Medicine | Admitting: Internal Medicine

## 2022-09-16 DIAGNOSIS — S0181XA Laceration without foreign body of other part of head, initial encounter: Secondary | ICD-10-CM | POA: Diagnosis present

## 2022-09-16 DIAGNOSIS — I509 Heart failure, unspecified: Secondary | ICD-10-CM | POA: Diagnosis not present

## 2022-09-16 DIAGNOSIS — I700 Atherosclerosis of aorta: Secondary | ICD-10-CM | POA: Diagnosis not present

## 2022-09-16 DIAGNOSIS — Z79899 Other long term (current) drug therapy: Secondary | ICD-10-CM

## 2022-09-16 DIAGNOSIS — R7989 Other specified abnormal findings of blood chemistry: Secondary | ICD-10-CM | POA: Diagnosis present

## 2022-09-16 DIAGNOSIS — I110 Hypertensive heart disease with heart failure: Secondary | ICD-10-CM | POA: Diagnosis not present

## 2022-09-16 DIAGNOSIS — G4733 Obstructive sleep apnea (adult) (pediatric): Secondary | ICD-10-CM | POA: Diagnosis not present

## 2022-09-16 DIAGNOSIS — F419 Anxiety disorder, unspecified: Secondary | ICD-10-CM | POA: Diagnosis present

## 2022-09-16 DIAGNOSIS — I4891 Unspecified atrial fibrillation: Secondary | ICD-10-CM | POA: Diagnosis not present

## 2022-09-16 DIAGNOSIS — R600 Localized edema: Secondary | ICD-10-CM

## 2022-09-16 DIAGNOSIS — G4700 Insomnia, unspecified: Secondary | ICD-10-CM | POA: Diagnosis present

## 2022-09-16 DIAGNOSIS — I4892 Unspecified atrial flutter: Secondary | ICD-10-CM

## 2022-09-16 DIAGNOSIS — Z7401 Bed confinement status: Secondary | ICD-10-CM | POA: Diagnosis not present

## 2022-09-16 DIAGNOSIS — E039 Hypothyroidism, unspecified: Secondary | ICD-10-CM | POA: Diagnosis present

## 2022-09-16 DIAGNOSIS — Z79890 Hormone replacement therapy (postmenopausal): Secondary | ICD-10-CM

## 2022-09-16 DIAGNOSIS — G8929 Other chronic pain: Secondary | ICD-10-CM | POA: Diagnosis not present

## 2022-09-16 DIAGNOSIS — I5041 Acute combined systolic (congestive) and diastolic (congestive) heart failure: Secondary | ICD-10-CM | POA: Diagnosis not present

## 2022-09-16 DIAGNOSIS — N3942 Incontinence without sensory awareness: Secondary | ICD-10-CM | POA: Diagnosis not present

## 2022-09-16 DIAGNOSIS — N3281 Overactive bladder: Secondary | ICD-10-CM | POA: Diagnosis present

## 2022-09-16 DIAGNOSIS — F32A Depression, unspecified: Secondary | ICD-10-CM | POA: Diagnosis not present

## 2022-09-16 DIAGNOSIS — S61411A Laceration without foreign body of right hand, initial encounter: Secondary | ICD-10-CM | POA: Diagnosis not present

## 2022-09-16 DIAGNOSIS — Z9851 Tubal ligation status: Secondary | ICD-10-CM

## 2022-09-16 DIAGNOSIS — M4804 Spinal stenosis, thoracic region: Secondary | ICD-10-CM | POA: Diagnosis not present

## 2022-09-16 DIAGNOSIS — G20C Parkinsonism, unspecified: Secondary | ICD-10-CM | POA: Diagnosis not present

## 2022-09-16 DIAGNOSIS — R32 Unspecified urinary incontinence: Secondary | ICD-10-CM | POA: Diagnosis not present

## 2022-09-16 DIAGNOSIS — Z888 Allergy status to other drugs, medicaments and biological substances status: Secondary | ICD-10-CM

## 2022-09-16 DIAGNOSIS — S0990XA Unspecified injury of head, initial encounter: Secondary | ICD-10-CM | POA: Diagnosis not present

## 2022-09-16 DIAGNOSIS — K219 Gastro-esophageal reflux disease without esophagitis: Secondary | ICD-10-CM | POA: Diagnosis present

## 2022-09-16 DIAGNOSIS — Z8249 Family history of ischemic heart disease and other diseases of the circulatory system: Secondary | ICD-10-CM

## 2022-09-16 DIAGNOSIS — E538 Deficiency of other specified B group vitamins: Secondary | ICD-10-CM | POA: Diagnosis present

## 2022-09-16 DIAGNOSIS — Z9181 History of falling: Secondary | ICD-10-CM | POA: Diagnosis not present

## 2022-09-16 DIAGNOSIS — I672 Cerebral atherosclerosis: Secondary | ICD-10-CM | POA: Diagnosis not present

## 2022-09-16 DIAGNOSIS — R296 Repeated falls: Secondary | ICD-10-CM | POA: Diagnosis present

## 2022-09-16 DIAGNOSIS — Z96651 Presence of right artificial knee joint: Secondary | ICD-10-CM | POA: Diagnosis not present

## 2022-09-16 DIAGNOSIS — Z23 Encounter for immunization: Secondary | ICD-10-CM | POA: Diagnosis not present

## 2022-09-16 DIAGNOSIS — E876 Hypokalemia: Secondary | ICD-10-CM | POA: Diagnosis not present

## 2022-09-16 DIAGNOSIS — Z66 Do not resuscitate: Secondary | ICD-10-CM | POA: Diagnosis present

## 2022-09-16 DIAGNOSIS — Z634 Disappearance and death of family member: Secondary | ICD-10-CM

## 2022-09-16 DIAGNOSIS — R000 Tachycardia, unspecified: Secondary | ICD-10-CM | POA: Diagnosis not present

## 2022-09-16 DIAGNOSIS — R2689 Other abnormalities of gait and mobility: Secondary | ICD-10-CM | POA: Diagnosis not present

## 2022-09-16 DIAGNOSIS — M1990 Unspecified osteoarthritis, unspecified site: Secondary | ICD-10-CM | POA: Diagnosis not present

## 2022-09-16 DIAGNOSIS — F339 Major depressive disorder, recurrent, unspecified: Secondary | ICD-10-CM | POA: Diagnosis not present

## 2022-09-16 DIAGNOSIS — Z7901 Long term (current) use of anticoagulants: Secondary | ICD-10-CM

## 2022-09-16 DIAGNOSIS — R278 Other lack of coordination: Secondary | ICD-10-CM | POA: Diagnosis not present

## 2022-09-16 DIAGNOSIS — E669 Obesity, unspecified: Secondary | ICD-10-CM | POA: Diagnosis present

## 2022-09-16 DIAGNOSIS — M6281 Muscle weakness (generalized): Secondary | ICD-10-CM | POA: Diagnosis not present

## 2022-09-16 DIAGNOSIS — I5031 Acute diastolic (congestive) heart failure: Secondary | ICD-10-CM

## 2022-09-16 DIAGNOSIS — Z6837 Body mass index (BMI) 37.0-37.9, adult: Secondary | ICD-10-CM

## 2022-09-16 DIAGNOSIS — M47816 Spondylosis without myelopathy or radiculopathy, lumbar region: Secondary | ICD-10-CM | POA: Diagnosis not present

## 2022-09-16 DIAGNOSIS — S3992XA Unspecified injury of lower back, initial encounter: Secondary | ICD-10-CM | POA: Diagnosis not present

## 2022-09-16 DIAGNOSIS — R0602 Shortness of breath: Secondary | ICD-10-CM | POA: Diagnosis not present

## 2022-09-16 DIAGNOSIS — Z885 Allergy status to narcotic agent status: Secondary | ICD-10-CM

## 2022-09-16 DIAGNOSIS — W010XXA Fall on same level from slipping, tripping and stumbling without subsequent striking against object, initial encounter: Secondary | ICD-10-CM | POA: Diagnosis not present

## 2022-09-16 DIAGNOSIS — E785 Hyperlipidemia, unspecified: Secondary | ICD-10-CM | POA: Diagnosis present

## 2022-09-16 DIAGNOSIS — E559 Vitamin D deficiency, unspecified: Secondary | ICD-10-CM | POA: Diagnosis not present

## 2022-09-16 DIAGNOSIS — I10 Essential (primary) hypertension: Secondary | ICD-10-CM | POA: Diagnosis present

## 2022-09-16 DIAGNOSIS — S199XXA Unspecified injury of neck, initial encounter: Secondary | ICD-10-CM | POA: Diagnosis not present

## 2022-09-16 DIAGNOSIS — Y92009 Unspecified place in unspecified non-institutional (private) residence as the place of occurrence of the external cause: Secondary | ICD-10-CM | POA: Diagnosis not present

## 2022-09-16 DIAGNOSIS — Z87891 Personal history of nicotine dependence: Secondary | ICD-10-CM

## 2022-09-16 DIAGNOSIS — R718 Other abnormality of red blood cells: Secondary | ICD-10-CM | POA: Diagnosis not present

## 2022-09-16 DIAGNOSIS — R29898 Other symptoms and signs involving the musculoskeletal system: Secondary | ICD-10-CM | POA: Diagnosis not present

## 2022-09-16 DIAGNOSIS — R8290 Unspecified abnormal findings in urine: Secondary | ICD-10-CM | POA: Diagnosis not present

## 2022-09-16 LAB — CBC WITH DIFFERENTIAL/PLATELET
Abs Immature Granulocytes: 0.03 10*3/uL (ref 0.00–0.07)
Basophils Absolute: 0 10*3/uL (ref 0.0–0.1)
Basophils Relative: 0 %
Eosinophils Absolute: 0.1 10*3/uL (ref 0.0–0.5)
Eosinophils Relative: 1 %
HCT: 47.2 % — ABNORMAL HIGH (ref 36.0–46.0)
Hemoglobin: 15.3 g/dL — ABNORMAL HIGH (ref 12.0–15.0)
Immature Granulocytes: 0 %
Lymphocytes Relative: 17 %
Lymphs Abs: 1.7 10*3/uL (ref 0.7–4.0)
MCH: 32.7 pg (ref 26.0–34.0)
MCHC: 32.4 g/dL (ref 30.0–36.0)
MCV: 100.9 fL — ABNORMAL HIGH (ref 80.0–100.0)
Monocytes Absolute: 1 10*3/uL (ref 0.1–1.0)
Monocytes Relative: 10 %
Neutro Abs: 7 10*3/uL (ref 1.7–7.7)
Neutrophils Relative %: 72 %
Platelets: 208 10*3/uL (ref 150–400)
RBC: 4.68 MIL/uL (ref 3.87–5.11)
RDW: 13.7 % (ref 11.5–15.5)
WBC: 9.8 10*3/uL (ref 4.0–10.5)
nRBC: 0 % (ref 0.0–0.2)

## 2022-09-16 LAB — COMPREHENSIVE METABOLIC PANEL
ALT: 14 U/L (ref 0–44)
AST: 17 U/L (ref 15–41)
Albumin: 3 g/dL — ABNORMAL LOW (ref 3.5–5.0)
Alkaline Phosphatase: 72 U/L (ref 38–126)
Anion gap: 7 (ref 5–15)
BUN: 27 mg/dL — ABNORMAL HIGH (ref 8–23)
CO2: 24 mmol/L (ref 22–32)
Calcium: 7.1 mg/dL — ABNORMAL LOW (ref 8.9–10.3)
Chloride: 107 mmol/L (ref 98–111)
Creatinine, Ser: 0.77 mg/dL (ref 0.44–1.00)
GFR, Estimated: >60 mL/min (ref >60)
Glucose, Bld: 72 mg/dL (ref 70–99)
Potassium: 2.9 mmol/L — ABNORMAL LOW (ref 3.5–5.1)
Sodium: 138 mmol/L (ref 135–145)
Total Bilirubin: 0.5 mg/dL (ref 0.3–1.2)
Total Protein: 5.4 g/dL — ABNORMAL LOW (ref 6.5–8.1)

## 2022-09-16 LAB — TSH: TSH: 4.765 u[IU]/mL — ABNORMAL HIGH (ref 0.350–4.500)

## 2022-09-16 LAB — T4, FREE: Free T4: 1.22 ng/dL — ABNORMAL HIGH (ref 0.61–1.12)

## 2022-09-16 LAB — MAGNESIUM: Magnesium: 1.9 mg/dL (ref 1.7–2.4)

## 2022-09-16 LAB — BRAIN NATRIURETIC PEPTIDE: B Natriuretic Peptide: 454.5 pg/mL — ABNORMAL HIGH (ref 0.0–100.0)

## 2022-09-16 MED ORDER — VENLAFAXINE HCL ER 75 MG PO CP24: 75.0000 mg | ORAL_CAPSULE | Freq: Every day | ORAL | Status: DC

## 2022-09-16 MED ORDER — OXYCODONE-ACETAMINOPHEN 5-325 MG PO TABS
1.0000 | ORAL_TABLET | Freq: Once | ORAL | Status: AC
  Administered 2022-09-16: 1 via ORAL
  Filled 2022-09-16: qty 1

## 2022-09-16 MED ORDER — LEVOTHYROXINE SODIUM 88 MCG PO TABS
88.0000 ug | ORAL_TABLET | Freq: Every day | ORAL | Status: DC
  Administered 2022-09-18: 88 ug via ORAL
  Filled 2022-09-16 (×3): qty 1

## 2022-09-16 MED ORDER — PANTOPRAZOLE SODIUM 40 MG PO TBEC
40.0000 mg | DELAYED_RELEASE_TABLET | Freq: Every day | ORAL | Status: DC
  Administered 2022-09-17 – 2022-09-23 (×7): 40 mg via ORAL
  Filled 2022-09-16 (×7): qty 1

## 2022-09-16 MED ORDER — DILTIAZEM HCL 30 MG PO TABS
15.0000 mg | ORAL_TABLET | Freq: Three times a day (TID) | ORAL | Status: DC
  Administered 2022-09-17 – 2022-09-18 (×3): 15 mg via ORAL
  Filled 2022-09-16 (×3): qty 1
  Filled 2022-09-16: qty 0.5
  Filled 2022-09-16 (×2): qty 1
  Filled 2022-09-16 (×2): qty 0.5

## 2022-09-16 MED ORDER — PRAVASTATIN SODIUM 40 MG PO TABS
40.0000 mg | ORAL_TABLET | Freq: Every day | ORAL | Status: DC
  Administered 2022-09-17 – 2022-09-22 (×6): 40 mg via ORAL
  Filled 2022-09-16 (×6): qty 1

## 2022-09-16 MED ORDER — AMLODIPINE BESYLATE 5 MG PO TABS: 5.0000 mg | ORAL_TABLET | Freq: Every day | ORAL | Status: DC

## 2022-09-16 MED ORDER — DILTIAZEM HCL 25 MG/5ML IV SOLN
10.0000 mg | Freq: Once | INTRAVENOUS | Status: AC
  Administered 2022-09-16: 10 mg via INTRAVENOUS
  Filled 2022-09-16: qty 5

## 2022-09-16 MED ORDER — POTASSIUM CHLORIDE 10 MEQ/100ML IV SOLN
10.0000 meq | INTRAVENOUS | Status: AC
  Administered 2022-09-16 (×2): 10 meq via INTRAVENOUS
  Filled 2022-09-16: qty 100

## 2022-09-16 MED ORDER — DILTIAZEM HCL-DEXTROSE 125-5 MG/125ML-% IV SOLN (PREMIX)
5.0000 mg/h | INTRAVENOUS | Status: DC
  Administered 2022-09-16: 5 mg/h via INTRAVENOUS
  Filled 2022-09-16: qty 125

## 2022-09-16 MED ORDER — HYDROCHLOROTHIAZIDE 12.5 MG PO TABS
12.5000 mg | ORAL_TABLET | Freq: Every day | ORAL | Status: DC
  Administered 2022-09-17: 12.5 mg via ORAL
  Filled 2022-09-16: qty 1

## 2022-09-16 MED ORDER — VITAMIN D 25 MCG (1000 UNIT) PO TABS
1000.0000 [IU] | ORAL_TABLET | Freq: Every day | ORAL | Status: DC
  Administered 2022-09-17 – 2022-09-23 (×7): 1000 [IU] via ORAL
  Filled 2022-09-16 (×7): qty 1

## 2022-09-16 MED ORDER — METOPROLOL SUCCINATE ER 100 MG PO TB24
100.0000 mg | ORAL_TABLET | Freq: Every day | ORAL | Status: DC
  Administered 2022-09-17 – 2022-09-20 (×4): 100 mg via ORAL
  Filled 2022-09-16 (×5): qty 1

## 2022-09-16 MED ORDER — LOSARTAN POTASSIUM-HCTZ 50-12.5 MG PO TABS: 1.0000 | ORAL_TABLET | Freq: Every day | ORAL | Status: DC

## 2022-09-16 MED ORDER — BUPROPION HCL ER (XL) 150 MG PO TB24
300.0000 mg | ORAL_TABLET | Freq: Every day | ORAL | Status: DC
  Administered 2022-09-17 – 2022-09-23 (×7): 300 mg via ORAL
  Filled 2022-09-16 (×7): qty 2

## 2022-09-16 MED ORDER — ACETAMINOPHEN 325 MG PO TABS
650.0000 mg | ORAL_TABLET | Freq: Four times a day (QID) | ORAL | Status: DC | PRN
  Administered 2022-09-17 – 2022-09-23 (×8): 650 mg via ORAL
  Filled 2022-09-16 (×8): qty 2

## 2022-09-16 MED ORDER — ONDANSETRON HCL 4 MG/2ML IJ SOLN: 4.0000 mg | Freq: Four times a day (QID) | INTRAMUSCULAR | Status: DC | PRN

## 2022-09-16 MED ORDER — ENOXAPARIN SODIUM 40 MG/0.4ML IJ SOSY
40.0000 mg | PREFILLED_SYRINGE | INTRAMUSCULAR | Status: DC
  Administered 2022-09-17: 40 mg via SUBCUTANEOUS
  Filled 2022-09-16: qty 0.4

## 2022-09-16 MED ORDER — MAGNESIUM HYDROXIDE 400 MG/5ML PO SUSP
30.0000 mL | Freq: Every day | ORAL | Status: DC | PRN
  Administered 2022-09-20: 30 mL via ORAL
  Filled 2022-09-16: qty 30

## 2022-09-16 MED ORDER — HYDROMORPHONE HCL 1 MG/ML IJ SOLN
0.5000 mg | Freq: Once | INTRAMUSCULAR | Status: AC
  Administered 2022-09-16: 0.5 mg via INTRAVENOUS
  Filled 2022-09-16: qty 0.5

## 2022-09-16 MED ORDER — SODIUM CHLORIDE 0.9 % IV BOLUS
250.0000 mL | Freq: Once | INTRAVENOUS | Status: AC
  Administered 2022-09-16: 250 mL via INTRAVENOUS

## 2022-09-16 MED ORDER — FUROSEMIDE 10 MG/ML IJ SOLN
40.0000 mg | Freq: Every day | INTRAMUSCULAR | Status: DC
  Administered 2022-09-17 – 2022-09-19 (×4): 40 mg via INTRAVENOUS
  Filled 2022-09-16 (×4): qty 4

## 2022-09-16 MED ORDER — MIRABEGRON ER 25 MG PO TB24
25.0000 mg | ORAL_TABLET | Freq: Every day | ORAL | Status: DC
  Administered 2022-09-17 – 2022-09-23 (×7): 25 mg via ORAL
  Filled 2022-09-16 (×7): qty 1

## 2022-09-16 MED ORDER — ACETAMINOPHEN 650 MG RE SUPP: 650.0000 mg | Freq: Four times a day (QID) | RECTAL | Status: DC | PRN

## 2022-09-16 MED ORDER — POTASSIUM CHLORIDE CRYS ER 20 MEQ PO TBCR
40.0000 meq | EXTENDED_RELEASE_TABLET | Freq: Once | ORAL | Status: AC
  Administered 2022-09-16: 40 meq via ORAL
  Filled 2022-09-16: qty 2

## 2022-09-16 MED ORDER — TRAZODONE HCL 50 MG PO TABS
25.0000 mg | ORAL_TABLET | Freq: Every evening | ORAL | Status: DC | PRN
  Administered 2022-09-17: 25 mg via ORAL
  Filled 2022-09-16: qty 1

## 2022-09-16 MED ORDER — GABAPENTIN 100 MG PO CAPS
200.0000 mg | ORAL_CAPSULE | Freq: Two times a day (BID) | ORAL | Status: DC
  Administered 2022-09-17 – 2022-09-23 (×13): 200 mg via ORAL
  Filled 2022-09-16 (×13): qty 2

## 2022-09-16 MED ORDER — TRAZODONE HCL 100 MG PO TABS
100.0000 mg | ORAL_TABLET | Freq: Every day | ORAL | Status: DC
  Administered 2022-09-17 – 2022-09-22 (×6): 100 mg via ORAL
  Filled 2022-09-16 (×6): qty 1

## 2022-09-16 MED ORDER — ONDANSETRON HCL 4 MG PO TABS: 4.0000 mg | ORAL_TABLET | Freq: Four times a day (QID) | ORAL | Status: DC | PRN

## 2022-09-16 MED ORDER — SENNA 8.6 MG PO TABS
2.0000 | ORAL_TABLET | Freq: Every evening | ORAL | Status: DC | PRN
  Administered 2022-09-21 – 2022-09-22 (×3): 17.2 mg via ORAL
  Filled 2022-09-16 (×3): qty 2

## 2022-09-16 MED ORDER — LOSARTAN POTASSIUM 50 MG PO TABS
50.0000 mg | ORAL_TABLET | Freq: Every day | ORAL | Status: DC
  Administered 2022-09-17: 50 mg via ORAL
  Filled 2022-09-16: qty 1

## 2022-09-16 MED ORDER — TRAMADOL HCL 50 MG PO TABS
50.0000 mg | ORAL_TABLET | Freq: Two times a day (BID) | ORAL | Status: DC | PRN
  Administered 2022-09-17 – 2022-09-23 (×7): 50 mg via ORAL
  Filled 2022-09-16 (×9): qty 1

## 2022-09-16 MED ORDER — TETANUS-DIPHTH-ACELL PERTUSSIS 5-2.5-18.5 LF-MCG/0.5 IM SUSY
0.5000 mL | PREFILLED_SYRINGE | Freq: Once | INTRAMUSCULAR | Status: AC
  Administered 2022-09-16: 0.5 mL via INTRAMUSCULAR
  Filled 2022-09-16: qty 0.5

## 2022-09-16 NOTE — ED Triage Notes (Signed)
Pt to ED from PCP for fall this morning, hit right side of face. Denies LOC. Sent for a fib. Denies hx of same, denies chest pain, shob.  NAD noted.

## 2022-09-16 NOTE — Assessment & Plan Note (Addendum)
Patient remained in RVR on diltiazem infusion.  Cardiology switched to p.o. diltiazem and Toprol, also added IV amiodarone for better rate control. Had atrial flutter with RVR. Echocardiogram pending. -If rate remained uncontrolled might need DCCV per cardiology -Started on heparin infusion-will need p.o. anticoagulation on discharge. -Cardiology is on board-appreciate their help

## 2022-09-16 NOTE — H&P (Signed)
[place]   PATIENT NAME: Robin Espinoza    MR#:  [id number]  DATE OF BIRTH:  [birth year]  DATE OF ADMISSION:  09/16/2022  PRIMARY CARE PHYSICIAN: Lauro Regulus, MD   Patient is coming from: Home  REQUESTING/REFERRING PHYSICIAN: Georga Hacking, MD  CHIEF COMPLAINT:   Chief Complaint  Patient presents with   Fall   Palpitations    HISTORY OF PRESENT ILLNESS:  Robin Espinoza is a 77 y.o. Caucasian female with medical history significant for anxiety, depression, hypertension, dyslipidemia, hypothyroidism, overactive bladder, obstructive sleep apnea and osteoarthritis, who presented to the emergency room with acute onset of fall while using her rollator today.  She stated that she felt lightheaded and has been feeling fatigued lately.  She hit the right side of her face with subsequent forehead laceration as well as had a right hand skin tear.  She denies any loss of consciousness.  She denies any chest pain or significant palpitations.  No dyspnea or orthopnea paroxysmal nocturnal dyspnea.  She has bilateral lower extremity edema without significant worsening.  She denies any paroxysmal nocturnal dyspnea.  No fever or chills.  No cough or wheezing.  No dysuria, oliguria or hematuria, urgency or frequency or flank pain.  No paresthesias or focal muscle weakness.  ED Course: When she came to the ER BP was 116/91 with a heart rate of 143 and  respiratory rate of 20, temperature 98.1 and pulse currently 96% on room air.  Labs revealed hypokalemia of 2.9, BUN of 27 and albumin 3 with total protein of 5.4.  BNP was 454.5.  CBC showed hemoconcentration and microcytosis.  TSH was 4.76 and free T4 1.22 both minimally elevated. EKG as reviewed by me : EKG showed atrial fibrillation with rapid ventricular response of 125 with low voltage QRS. Imaging: Portable chest x-ray showed no acute cardiopulmonary disease. Noncontrasted head CT scan showed no acute intracranial  abnormalities. C-spine CT showed no acute findings.  There were multilevel severe degenerative changes of the spine with associated multilevel posterior disc osteophyte complex formations and associated osseous neuroforaminal stenosis at both C3-C4 and C4-C5 levels. Lumbar spine CT without contrast revealed the following: 1. No acute displaced fracture or traumatic listhesis of the lumbar spine. 2. Multilevel posterior disc osteophyte complex formation with associated left L4-L5 as well as T11-T12 and T10-T11 severe osseous neural foraminal stenosis. Suggestion of at least moderate to severe osseous neural foraminal stenosis at the L1-L2 and T10-T11. 3.  Aortic Atherosclerosis (ICD10-I70.0). 4. 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 Recommend follow-up ultrasound every 3 years. This recommendation follows ACR consensus guidelines.  The patient was given IV diltiazem bolus followed by drip, Tdap booster, 1 p.o. Percocet, 40 mill equivalent p.o. potassium chloride and 10 mill equivalent IV.  She will be admitted to a progressive unit bed for further evaluation and management. PAST MEDICAL HISTORY:   Past Medical History:  Diagnosis Date   Anxiety    Depression    Family history of adverse reaction to anesthesia    sister had a reaction when had a baby   GERD (gastroesophageal reflux disease)    Hyperlipidemia 12/21/2005   Hypertension    Hypothyroidism    Insomnia    OAB (overactive bladder)    Obstructive sleep apnea    Osteoarthrosis    Vitamin D deficiency     PAST SURGICAL HISTORY:   Past Surgical History:  Procedure Laterality Date   COLONOSCOPY     X  2   Cortisone injections in back     EYE SURGERY     TOTAL KNEE ARTHROPLASTY 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   Procedure: RIGHT TOTAL KNEE ARTHROPLASTY;  Surgeon: Juanell Fairly, MD;  Location: [facility] ORS;  Service: Orthopedics;  Laterality: Right;   TUBAL LIGATION  1981    SOCIAL HISTORY:   Social History   Tobacco Use    Smoking status: Former   Smokeless tobacco: Never   Tobacco comments:    in college  Substance Use Topics   Alcohol use: Yes    Comment: rarely 1 drink    FAMILY HISTORY:   Family History  Problem Relation Age of Onset   Diabetes Father    Breast cancer Neg Hx     DRUG ALLERGIES:   Allergies  Allergen Reactions   Amlodipine     Ankle swelling    Oxycodone Other (See Comments)    hallucinated    REVIEW OF SYSTEMS:   ROS As per history of present illness. All pertinent systems were reviewed above. Constitutional, HEENT, cardiovascular, respiratory, GI, GU, musculoskeletal, neuro, psychiatric, endocrine, integumentary and hematologic systems were reviewed and are otherwise negative/unremarkable except for positive findings mentioned above in the HPI.   MEDICATIONS AT HOME:   Prior to Admission medications   Medication Sig Start Date End Date Taking? Authorizing Provider  acetaminophen (TYLENOL) 650 MG CR tablet Take 1,300 mg by mouth every 8 (eight) hours as needed for pain.    [provider]  amLODipine (NORVASC) 5 MG tablet Take 5 mg by mouth daily.    [provider]  buPROPion (WELLBUTRIN XL) 150 MG 24 hr tablet Take by mouth. 03/26/22 03/26/23  [provider]  Carboxymethylcellulose Sodium (THERATEARS OP) Place 1 drop into both eyes daily as needed (dry eyes).    [provider]  celecoxib (CELEBREX) 200 MG capsule Take 200 mg by mouth 2 (two) times daily. 06/14/22   [provider]  gabapentin (NEURONTIN) 100 MG capsule Take by mouth.    [provider]  levothyroxine (SYNTHROID) 88 MCG tablet Take 1 tablet (88 mcg total) by mouth daily. 02/19/22   Jacky Kindle, FNP  losartan-hydrochlorothiazide (HYZAAR) 50-12.5 MG tablet TAKE 1 TABLET BY MOUTH EVERY DAY 06/24/22   Merita Norton T, FNP  lovastatin (MEVACOR) 40 MG tablet TAKE 1 TABLET BY MOUTH EVERYDAY AT BEDTIME 12/06/20   Chrismon, Jodell Cipro, PA-C  metoprolol succinate  (TOPROL-XL) 100 MG 24 hr tablet TAKE 1 TABLET BY MOUTH DAILY. TAKE WITH OR IMMEDIATELY FOLLOWING A MEAL. 07/13/22   Jacky Kindle, FNP  mirabegron ER (MYRBETRIQ) 25 MG TB24 tablet Take 1 tablet (25 mg total) by mouth daily. 08/11/22   Alfredo Martinez, MD  NON FORMULARY CPAP 08/04/06   [provider]  omeprazole (PRILOSEC) 20 MG capsule TAKE 1 CAPSULE BY MOUTH EVERY DAY 02/19/22   Jacky Kindle, FNP  senna (SENOKOT) 8.6 MG tablet Take 2 tablets (17.2 mg total) by mouth at bedtime. 01/30/21   Jacky Kindle, FNP  traMADol (ULTRAM) 50 MG tablet Take 50 mg by mouth 2 (two) times daily as needed. 01/27/21   [provider]  traZODone (DESYREL) 50 MG tablet Take 2 tablets (100 mg total) by mouth daily. 04/24/21   Jacky Kindle, FNP  venlafaxine XR (EFFEXOR-XR) 75 MG 24 hr capsule TAKE 1 CAPSULE BY MOUTH DAILY WITH BREAKFAST. 11/10/21   Jacky Kindle, FNP  Vitamin D, Ergocalciferol, (DRISDOL) 1.25 MG (50000 UNIT) CAPS capsule TAKE 1  CAPSULE (50,000 UNITS TOTAL) BY MOUTH EVERY 7 (SEVEN) DAYS 08/05/20   Bacigalupo, Marzella Schlein, MD  Vitamin D3 (VITAMIN D) 25 MCG tablet Take 1,000 Units by mouth daily.    [provider]      VITAL SIGNS:  Blood pressure 96/83, pulse (!) 136, temperature 98.1 F (36.7 C), resp. rate 18, height 5\' 4"  (1.626 m), weight 99.3 kg, SpO2 95 %.  PHYSICAL EXAMINATION:  Physical Exam  GENERAL:  77 y.o.-year-old patient lying in the bed with no acute distress.  EYES: Pupils equal, round, reactive to light and accommodation. No scleral icterus. Extraocular muscles intact.  HEENT: Head atraumatic, normocephalic. Oropharynx and nasopharynx clear.  NECK:  Supple, no jugular venous distention. No thyroid enlargement, no tenderness.  LUNGS: Normal breath sounds bilaterally, no wheezing, rales,rhonchi or crepitation. No use of accessory muscles of respiration.  CARDIOVASCULAR: Irregularly irregular slightly tachycardic rhythm, S1, S2 normal. No murmurs, rubs, or  gallops.  ABDOMEN: Soft, nondistended, nontender. Bowel sounds present. No organomegaly or mass.  EXTREMITIES: No pedal edema, cyanosis, or clubbing.  NEUROLOGIC: Cranial nerves II through XII are intact. Muscle strength 5/5 in all extremities. Sensation intact. Gait not checked.  PSYCHIATRIC: The patient is alert and oriented x 3.  Normal affect and good eye contact. SKIN: No obvious rash, lesion, or ulcer.   LABORATORY PANEL:   CBC Recent Labs  Lab 09/16/22 1644  WBC 9.8  HGB 15.3*  HCT 47.2*  PLT 208   ------------------------------------------------------------------------------------------------------------------  Chemistries  Recent Labs  Lab 09/16/22 1849  NA 138  K 2.9*  CL 107  CO2 24  GLUCOSE 72  BUN 27*  CREATININE 0.77  CALCIUM 7.1*  AST 17  ALT 14  ALKPHOS 72  BILITOT 0.5   ------------------------------------------------------------------------------------------------------------------  Cardiac Enzymes No results for input(s): "TROPONINI" in the last 168 hours. ------------------------------------------------------------------------------------------------------------------  RADIOLOGY:  CT HEAD WO CONTRAST ( )  Result Date: 09/16/2022 CLINICAL DATA:  Neck trauma (Age >= 65y); Head trauma, minor (Age >= 65y) EXAM: CT HEAD WITHOUT CONTRAST CT CERVICAL SPINE WITHOUT CONTRAST TECHNIQUE: Multidetector CT imaging of the head and cervical spine was performed following the standard protocol without intravenous contrast. Multiplanar CT image reconstructions of the cervical spine were also generated. RADIATION DOSE REDUCTION: This exam was performed according to the departmental dose-optimization program which includes automated exposure control, adjustment of the mA and/or kV according to patient size and/or use of iterative reconstruction technique. COMPARISON:  MRI head 04/08/2021 FINDINGS: CT HEAD FINDINGS Brain: No evidence of large-territorial acute  infarction. No parenchymal hemorrhage. No mass lesion. No extra-axial collection. No mass effect or midline shift. No hydrocephalus. Basilar cisterns are patent. Vascular: No hyperdense vessel. Atherosclerotic calcifications are present within the cavernous internal carotid and vertebral arteries. Skull: No acute fracture or focal lesion. Sinuses/Orbits: Paranasal sinuses and mastoid air cells are clear. Bilateral lens replacement. Otherwise the orbits are unremarkable. Other: None. CT CERVICAL SPINE FINDINGS Alignment: Normal. Skull base and vertebrae: Multilevel severe degenerative changes of the spine with associated multilevel posterior disc osteophyte complex formations. Associated severe osseous neural foraminal stenosis at the bilateral C3-C4 and C4-C5 levels. No severe osseous central canal stenosis. No acute fracture. No aggressive appearing focal osseous lesion or focal pathologic process. Soft tissues and spinal canal: No prevertebral fluid or swelling. No visible canal hematoma. Upper chest: Unremarkable. Other: None. IMPRESSION: 1. No acute intracranial abnormality. 2. No acute displaced fracture or traumatic listhesis of the cervical spine. 3. Multilevel severe degenerative changes of the spine with associated  multilevel posterior disc osteophyte complex formations. Associated severe osseous neural foraminal stenosis at the bilateral C3-C4 and C4-C5 levels. Electronically Signed   By: Tish Frederickson M.D.   On: 09/16/2022 18:25   CT Cervical Spine Wo Contrast  Result Date: 09/16/2022 CLINICAL DATA:  Neck trauma (Age >= 65y); Head trauma, minor (Age >= 65y) EXAM: CT HEAD WITHOUT CONTRAST CT CERVICAL SPINE WITHOUT CONTRAST TECHNIQUE: Multidetector CT imaging of the head and cervical spine was performed following the standard protocol without intravenous contrast. Multiplanar CT image reconstructions of the cervical spine were also generated. RADIATION DOSE REDUCTION: This exam was performed according  to the departmental dose-optimization program which includes automated exposure control, adjustment of the mA and/or kV according to patient size and/or use of iterative reconstruction technique. COMPARISON:  MRI head 04/08/2021 FINDINGS: CT HEAD FINDINGS Brain: No evidence of large-territorial acute infarction. No parenchymal hemorrhage. No mass lesion. No extra-axial collection. No mass effect or midline shift. No hydrocephalus. Basilar cisterns are patent. Vascular: No hyperdense vessel. Atherosclerotic calcifications are present within the cavernous internal carotid and vertebral arteries. Skull: No acute fracture or focal lesion. Sinuses/Orbits: Paranasal sinuses and mastoid air cells are clear. Bilateral lens replacement. Otherwise the orbits are unremarkable. Other: None. CT CERVICAL SPINE FINDINGS Alignment: Normal. Skull base and vertebrae: Multilevel severe degenerative changes of the spine with associated multilevel posterior disc osteophyte complex formations. Associated severe osseous neural foraminal stenosis at the bilateral C3-C4 and C4-C5 levels. No severe osseous central canal stenosis. No acute fracture. No aggressive appearing focal osseous lesion or focal pathologic process. Soft tissues and spinal canal: No prevertebral fluid or swelling. No visible canal hematoma. Upper chest: Unremarkable. Other: None. IMPRESSION: 1. No acute intracranial abnormality. 2. No acute displaced fracture or traumatic listhesis of the cervical spine. 3. Multilevel severe degenerative changes of the spine with associated multilevel posterior disc osteophyte complex formations. Associated severe osseous neural foraminal stenosis at the bilateral C3-C4 and C4-C5 levels. Electronically Signed   By: Tish Frederickson M.D.   On: 09/16/2022 18:25   CT Lumbar Spine Wo Contrast  Result Date: 09/16/2022 CLINICAL DATA:  Back trauma, no prior imaging (Age >= 16y) EXAM: CT LUMBAR SPINE WITHOUT CONTRAST TECHNIQUE: Multidetector  CT imaging of the lumbar spine was performed without intravenous contrast administration. Multiplanar CT image reconstructions were also generated. RADIATION DOSE REDUCTION: This exam was performed according to the departmental dose-optimization program which includes automated exposure control, adjustment of the mA and/or kV according to patient size and/or use of iterative reconstruction technique. COMPARISON:  MRI lumbar spine 04/29/2021 FINDINGS: Segmentation: 5 lumbar type vertebrae. Alignment: Levoscoliosis centered at the L3 level. Vertebrae: Multilevel severe degenerative changes of the spine. Associated multiple level posterior disc osteophyte complex formation. Associated left L4-L5 as well as T11-T12 and T10-T11 severe osseous neural foraminal stenosis. Suggestion of at least moderate to severe osseous neural foraminal stenosis at the L1-L2 and T10-T11. Paraspinal and other soft tissues: Negative. Disc levels: Multilevel intervertebral disc space vacuum phenomenon. Other. Atherosclerotic plaque. Infrarenal abdominal aorta enlarged measuring up to 3.2 x 2.8 cm. IMPRESSION: 1. No acute displaced fracture or traumatic listhesis of the lumbar spine. 2. Multilevel posterior disc osteophyte complex formation with associated left L4-L5 as well as T11-T12 and T10-T11 severe osseous neural foraminal stenosis. Suggestion of at least moderate to severe osseous neural foraminal stenosis at the L1-L2 and T10-T11. 3.  Aortic Atherosclerosis (ICD10-I70.0). 4. 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 Recommend follow-up ultrasound every 3 years. This recommendation follows ACR consensus  guidelines: White Paper of the ACR Incidental Findings Committee II on Vascular Findings. J Am Coll Radiol 2013; 10:789-794. Electronically Signed   By: Tish Frederickson M.D.   On: 09/16/2022 18:21   DG Chest Portable 1 View  Result Date: 09/16/2022 CLINICAL DATA:  Short of breath, fell EXAM: PORTABLE CHEST 1 VIEW COMPARISON:   09/22/2019 FINDINGS: Single frontal view of the chest demonstrates an unremarkable cardiac silhouette. No acute airspace disease, effusion, or pneumothorax. No acute displaced fracture. IMPRESSION: 1. No acute intrathoracic process. Electronically Signed   By: Sharlet Salina M.D.   On: 09/16/2022 18:06      IMPRESSION AND PLAN:  Assessment and Plan: * Atrial fibrillation with rapid ventricular response (HCC) - The patient will be admitted to a progressive unit bed. - We will continue her on IV Cardizem. - We will optimize her electrolytes with aggressive replacement of her hypokalemia. - We will check a magnesium level. - Thyroid profile was unremarkable. - 2D echo will be obtained. [facility] cardiology consult will be obtained. - I notified [facility] group about the consult.  Elevated brain natriuretic peptide (BNP) level - We will monitor her for now.  She is currently asymptomatic. - I will avoid diuretic therapy given her significant hypokalemia. - 2D echo will be obtained as mentioned above and that should assess her EF.  Hypokalemia - Aggressive potassium of placement and we will check magnesium level as mentioned above.  Hypothyroidism - We will continue Synthroid.  GERD without esophagitis - We will continue PPI therapy.  Dyslipidemia - We will continue statin therapy.  Essential hypertension - We will continue her antihypertensive therapy.  Depression, recurrent (HCC) - We will continue her Wellbutrin XL, Effexor XR and Cymbalta.   DVT prophylaxis: Lovenox.  Advanced Care Planning:  Code Status: The patient is DNR and DNI.  This was discussed with her. Family Communication:  The plan of care was discussed in details with the patient (and family). I answered all questions. The patient agreed to proceed with the above mentioned plan. Further management will depend upon hospital course. Disposition Plan: Back to previous home environment Consults called: none.  All  the records are reviewed and case discussed with ED provider.  Status is: Inpatient  At the time of the admission, it appears that the appropriate admission status for this patient is inpatient.  This is judged to be reasonable and necessary in order to provide the required intensity of service to ensure the patient's safety given the presenting symptoms, physical exam findings and initial radiographic and laboratory data in the context of comorbid conditions.  The patient requires inpatient status due to high intensity of service, high risk of further deterioration and high frequency of surveillance required.  I certify that at the time of admission, it is my clinical judgment that the patient will require inpatient hospital care extending more than 2 midnights.                            Dispo: The patient is from: Home              Anticipated d/c is to: Home              Patient currently is not medically stable to d/c.              Difficult to place patient: No  Hannah Beat M.D on 09/16/2022 at 9:38 PM  [facility]  From 7 PM-7 AM, contact night-coverage [email or web address]  CC: Primary care physician; Lauro Regulus, MD

## 2022-09-16 NOTE — Assessment & Plan Note (Addendum)
-   We will continue her Wellbutrin XL, Effexor XR and Cymbalta.

## 2022-09-16 NOTE — Assessment & Plan Note (Signed)
-   We will continue Synthroid. 

## 2022-09-16 NOTE — Assessment & Plan Note (Signed)
No prior history of heart failure.  Echocardiogram ordered-pending -Cardiology started her on IV Lasix

## 2022-09-16 NOTE — ED Provider Triage Note (Signed)
Emergency Medicine Provider Triage Evaluation Note  MARGERY SZOSTAK , a 77 y.o. female  was evaluated in triage.  Pt complains of weakness for the past few months. Dizzy this morning causing her to trip and fall. She hit her right hand and the right side of her face. Saw Dr. Dareen Piano this afternoon and advised to come to the ER. EKG with questionable SVT with rate of 144 No chest pain or shortness of breath.  Physical Exam  BP (!) 116/91   Pulse (!) 143   Temp 98.1 F (36.7 C)   Resp 20   Ht 5\' 4"  (1.626 m)   Wt 99.3 kg   SpO2 96%   BMI 37.59 kg/m  Gen:   Awake, no distress   Resp:  Normal effort  MSK:   Moves extremities without difficulty  Other:    Medical Decision Making  Medically screening exam initiated at 4:44 PM.  Appropriate orders placed.  CALISSA SWENOR was informed that the remainder of the evaluation will be completed by another provider, this initial triage assessment does not replace that evaluation, and the importance of remaining in the ED until their evaluation is complete.  EKG showing uncontrolled A-fib. Patient denies history of the same. Denies blood thinners.   Chinita Pester, FNP 09/16/22 [phone]

## 2022-09-16 NOTE — Assessment & Plan Note (Signed)
Improved, potassium borderline low at 3.4 today.  Magnesium 1.9 -Replete potassium and monitor

## 2022-09-16 NOTE — Assessment & Plan Note (Signed)
-   We will continue her antihypertensive therapy. 

## 2022-09-16 NOTE — Assessment & Plan Note (Signed)
-   We will continue PPI therapy 

## 2022-09-16 NOTE — Assessment & Plan Note (Signed)
-   We will continue statin therapy. 

## 2022-09-16 NOTE — ED Provider Notes (Signed)
[facility] Provider Note    Event Date/Time   First MD Initiated Contact with Patient 09/16/22 [phone]     (approximate)   History   Fall and Palpitations   HPI  Robin Espinoza is a 77 y.o. female with past medical history of hypertension, hyperlipidemia, OSA, GERD, spinal stenosis who presents after a fall.  Patient was using her rollator today to walk when she felt lightheaded and lost balance did hit the right side of her face on a piece of furniture but did not lose consciousness.  Tells me that she is felt somewhat unwell for several days just feeling weak and intermittently lightheaded but denies any palpitations she denies dyspnea or chest pain.  Denies fevers chills nausea vomiting.  She is chronically incontinent of urine. Patient denies headache or neck pain.  She is complaining of worsening back pain.  Has chronic back pain from spinal stenosis.  History of arrhythmia     Past Medical History:  Diagnosis Date   Anxiety    Depression    Family history of adverse reaction to anesthesia    sister had a reaction when had a baby   GERD (gastroesophageal reflux disease)    Hyperlipidemia 12/21/2005   Hypertension    Hypothyroidism    Insomnia    OAB (overactive bladder)    Obstructive sleep apnea    Osteoarthrosis    Vitamin D deficiency     Patient Active Problem List   Diagnosis Date Noted   Prediabetes 01/30/2021   Morbid obesity (HCC) 01/30/2021   Chronic fatigue 01/30/2021   Depression, recurrent (HCC) 01/30/2021   Chronic idiopathic constipation 01/30/2021   Need for shingles vaccine 01/30/2021   S/P TKR (total knee replacement) using cement, 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   BMI 40.0-44.9, adult (HCC) 04/15/2018   Avitaminosis D 02/28/2015   Hypothyroidism 11/13/2014   Anxiety disorder 10/15/2014   Insomnia 09/21/2014   Acid reflux 11/30/2007   Arthritis, degenerative 09/16/2007   Fam hx-ischem heart disease 02/12/2007   Adiposity 02/09/2007    Obstructive apnea 05/06/2006   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   BP (high blood pressure) 03/23/1998     Physical Exam  Triage Vital Signs: ED Triage Vitals  Enc Vitals Group     BP 09/16/22 1642 (!) 116/91     Pulse Rate 09/16/22 1642 (!) 143     Resp 09/16/22 1642 20     Temp 09/16/22 1642 98.1 F (36.7 C)     Temp src --      SpO2 09/16/22 1642 96 %     Weight 09/16/22 1643 219 lb (99.3 kg)     Height 09/16/22 1643 5\' 4"  (1.626 m)     Head Circumference --      Peak Flow --      Pain Score 09/16/22 1643 0     Pain Loc --      Pain Edu? --      Excl. in GC? --     Most recent vital signs: Vitals:   09/16/22 1930 09/16/22 1945  BP: 125/65   Pulse: (!) 139 (!) 138  Resp: 12 14  Temp:    SpO2: 96% 96%     General: Awake, no distress.  CV:  Good peripheral perfusion pitting edema bilateral lower extremities Resp:  Normal effort.  Lung sounds are clear Abd:  No distention.  Neuro:             Awake, Alert,  Oriented x 3  Other:  + Midline L-spine tenderness   ED Results / Procedures / Treatments  Labs (all labs ordered are listed, but only abnormal results are displayed) Labs Reviewed  BRAIN NATRIURETIC PEPTIDE - Abnormal; Notable for the following components:      Result Value   B Natriuretic Peptide 454.5 (*)    All other components within normal limits  CBC WITH DIFFERENTIAL/PLATELET - Abnormal; Notable for the following components:   Hemoglobin 15.3 (*)    HCT 47.2 (*)    MCV 100.9 (*)    All other components within normal limits  TSH - Abnormal; Notable for the following components:   TSH 4.765 (*)    All other components within normal limits  T4, FREE - Abnormal; Notable for the following components:   Free T4 1.22 (*)    All other components within normal limits  COMPREHENSIVE METABOLIC PANEL - Abnormal; Notable for the following components:   Potassium 2.9 (*)    BUN 27 (*)    Calcium 7.1 (*)    Total Protein  5.4 (*)    Albumin 3.0 (*)    All other components within normal limits     EKG  EKG shows atrial fibrillation with RVR no acute ischemic changes   RADIOLOGY I reviewed and interpreted the CXR which does not show any acute cardiopulmonary process    PROCEDURES:  Critical Care performed: Yes, see critical care procedure note(s)  .Critical Care  Performed by: Georga Hacking, MD Authorized by: Georga Hacking, MD   Critical care provider statement:    Critical care time (minutes):  30   Critical care was time spent personally by me on the following activities:  Development of treatment plan with patient or surrogate, discussions with consultants, evaluation of patient's response to treatment, examination of patient, ordering and review of laboratory studies, ordering and review of radiographic studies, ordering and performing treatments and interventions, pulse oximetry, re-evaluation of patient's condition and review of old charts .1-3 Lead EKG Interpretation  Performed by: Georga Hacking, MD Authorized by: Georga Hacking, MD     Interpretation: abnormal     ECG rate assessment: tachycardic     Rhythm: atrial fibrillation     Ectopy: none     Conduction: normal   ..Laceration Repair  Date/Time: 09/16/2022 7:37 PM  Performed by: Georga Hacking, MD Authorized by: Georga Hacking, MD   Consent:    Consent obtained:  Verbal   Risks discussed:  Pain, infection and poor wound healing   Alternatives discussed:  No treatment Universal protocol:    Patient identity confirmed:  Verbally with patient Laceration details:    Location:  Hand   Hand location:  R hand, dorsum   Length (cm):  4 Treatment:    Area cleansed with:  Saline   Amount of cleaning:  Standard   Irrigation solution:  Sterile saline   Irrigation method:  Syringe   Visualized foreign bodies/material removed: no     Debridement:  None Skin repair:    Repair method:  Steri-Strips and  tissue adhesive   Number of Steri-Strips:  2 Approximation:    Approximation:  Close Repair type:    Repair type:  Simple Post-procedure details:    Dressing:  Open (no dressing)   The patient is on the cardiac monitor to evaluate for evidence of arrhythmia and/or significant heart rate changes.   MEDICATIONS ORDERED IN ED: Medications  diltiazem (CARDIZEM) 125 mg  in dextrose 5% 125 mL (1 mg/mL) infusion (10 mg/hr Intravenous Rate/Dose Change 09/16/22 1947)  potassium chloride SA (KLOR-CON M) CR tablet 40 mEq (has no administration in time range)  potassium chloride 10 mEq in 100 mL IVPB (has no administration in time range)  oxyCODONE-acetaminophen (PERCOCET/ROXICET) 5-325 MG per tablet 1 tablet (1 tablet Oral Given 09/16/22 1752)  diltiazem (CARDIZEM) injection 10 mg (10 mg Intravenous Given 09/16/22 1751)  HYDROmorphone (DILAUDID) injection 0.5 mg (0.5 mg Intravenous Given 09/16/22 1919)  Tdap (BOOSTRIX) injection 0.5 mL (0.5 mLs Intramuscular Given 09/16/22 1920)     IMPRESSION / MDM / ASSESSMENT AND PLAN / ED COURSE  I reviewed the triage vital signs and the nursing notes.                              Patient's presentation is most consistent with acute presentation with potential threat to life or bodily function.  Differential diagnosis includes, but is not limited to, syncope related to new atrial fibrillation, intracranial hemorrhage, lumbar compression fracture, electrolyte abnormality, hyperthyroidism, CHF  Patient is a 77 year old female presents after a fall and found to have new atrial fibrillation.  She has no prior history of A-fib.  Today was walking in her room using her rollator when she lost balance did states she felt lightheaded but does not think she passed out.  She hit her head and right hand.  She does have worsening of her chronic back pain since the fall no new numbness tingling weakness.  Denies any shortness of breath but has been feeling just somewhat  fatigued and intermittently lightheaded of late.  She does not sense palpitations.  Patient's heart rate is in the 140s she is in atrial fibs with RVR.  On exam she looks well she does have some pitting edema in her lower extremities tells me that this is chronic.  Lung sounds are clear.  She has a superficial laceration/skin tear on her right hand.  No midline C-spine tenderness does have midline lumbar tenderness.  She has good strength in her lower extremities.  Obtain CT head C-spine and L-spine which did not have any acute abnormalities.  Chest x-ray is clear no pulmonary edema.  I did give a dose of 10 mg of diltiazem which her blood pressure tolerated.  Still tachycardic afterwards and started on dill drip.  She will require admission.  Patient's labs are notable for potassium of 2.9.  TSH is high but so is the free T4.  No leukocytosis mildly elevated hemoglobin.  BNP mildly elevated as well but chest x-ray does not have any pulmonary edema.  Patient currently on a dill drip.  Did repair her hand wound with Steri-Strips and tissue adhesive.         FINAL CLINICAL IMPRESSION(S) / ED DIAGNOSES   Final diagnoses:  Atrial fibrillation with RVR (HCC)     Rx / DC Orders   ED Discharge Orders     None        Note:  This document was prepared using Dragon voice recognition software and may include unintentional dictation errors.   Georga Hacking, MD 09/16/22 [phone]

## 2022-09-17 ENCOUNTER — Other Ambulatory Visit (HOSPITAL_COMMUNITY): Payer: Self-pay

## 2022-09-17 ENCOUNTER — Encounter: Payer: Self-pay | Admitting: Family Medicine

## 2022-09-17 ENCOUNTER — Other Ambulatory Visit: Payer: PPO

## 2022-09-17 DIAGNOSIS — E039 Hypothyroidism, unspecified: Secondary | ICD-10-CM | POA: Diagnosis not present

## 2022-09-17 DIAGNOSIS — I509 Heart failure, unspecified: Secondary | ICD-10-CM

## 2022-09-17 DIAGNOSIS — F339 Major depressive disorder, recurrent, unspecified: Secondary | ICD-10-CM

## 2022-09-17 DIAGNOSIS — E876 Hypokalemia: Secondary | ICD-10-CM | POA: Diagnosis not present

## 2022-09-17 DIAGNOSIS — I4891 Unspecified atrial fibrillation: Secondary | ICD-10-CM | POA: Diagnosis not present

## 2022-09-17 DIAGNOSIS — R7989 Other specified abnormal findings of blood chemistry: Secondary | ICD-10-CM | POA: Diagnosis not present

## 2022-09-17 DIAGNOSIS — K219 Gastro-esophageal reflux disease without esophagitis: Secondary | ICD-10-CM

## 2022-09-17 LAB — URINALYSIS, COMPLETE (UACMP) WITH MICROSCOPIC
Bilirubin Urine: NEGATIVE
Glucose, UA: NEGATIVE mg/dL
Hgb urine dipstick: NEGATIVE
Ketones, ur: NEGATIVE mg/dL
Leukocytes,Ua: NEGATIVE
Nitrite: NEGATIVE
Protein, ur: NEGATIVE mg/dL
Specific Gravity, Urine: 1.008 (ref 1.005–1.030)
pH: 6 (ref 5.0–8.0)

## 2022-09-17 LAB — BASIC METABOLIC PANEL
Anion gap: 8 (ref 5–15)
BUN: 21 mg/dL (ref 8–23)
CO2: 27 mmol/L (ref 22–32)
Calcium: 8 mg/dL — ABNORMAL LOW (ref 8.9–10.3)
Chloride: 103 mmol/L (ref 98–111)
Creatinine, Ser: 0.74 mg/dL (ref 0.44–1.00)
GFR, Estimated: >60 mL/min (ref >60)
Glucose, Bld: 128 mg/dL — ABNORMAL HIGH (ref 70–99)
Potassium: 3.4 mmol/L — ABNORMAL LOW (ref 3.5–5.1)
Sodium: 138 mmol/L (ref 135–145)

## 2022-09-17 LAB — TROPONIN I (HIGH SENSITIVITY)
Troponin I (High Sensitivity): 58 ng/L — ABNORMAL HIGH (ref <18)
Troponin I (High Sensitivity): 48 ng/L — ABNORMAL HIGH (ref <18)

## 2022-09-17 LAB — CBC
HCT: 44.6 % (ref 36.0–46.0)
Hemoglobin: 14.5 g/dL (ref 12.0–15.0)
MCH: 33 pg (ref 26.0–34.0)
MCHC: 32.5 g/dL (ref 30.0–36.0)
MCV: 101.4 fL — ABNORMAL HIGH (ref 80.0–100.0)
Platelets: 165 10*3/uL (ref 150–400)
RBC: 4.4 MIL/uL (ref 3.87–5.11)
RDW: 13.6 % (ref 11.5–15.5)
WBC: 9.9 10*3/uL (ref 4.0–10.5)
nRBC: 0 % (ref 0.0–0.2)

## 2022-09-17 LAB — HEPARIN LEVEL (UNFRACTIONATED): Heparin Unfractionated: 0.53 IU/mL (ref 0.30–0.70)

## 2022-09-17 LAB — APTT: aPTT: >200 seconds (ref 24–36)

## 2022-09-17 MED ORDER — AMIODARONE HCL IN DEXTROSE 360-4.14 MG/200ML-% IV SOLN
30.0000 mg/h | INTRAVENOUS | Status: DC
  Administered 2022-09-17 – 2022-09-18 (×3): 30 mg/h via INTRAVENOUS
  Filled 2022-09-17 (×2): qty 200

## 2022-09-17 MED ORDER — POTASSIUM CHLORIDE CRYS ER 20 MEQ PO TBCR
40.0000 meq | EXTENDED_RELEASE_TABLET | Freq: Once | ORAL | Status: AC
  Administered 2022-09-17: 40 meq via ORAL
  Filled 2022-09-17: qty 2

## 2022-09-17 MED ORDER — HEPARIN (PORCINE) 25000 UT/250ML-% IV SOLN
1100.0000 [IU]/h | INTRAVENOUS | Status: AC
  Administered 2022-09-17 – 2022-09-18 (×2): 1100 [IU]/h via INTRAVENOUS
  Filled 2022-09-17 (×2): qty 250

## 2022-09-17 MED ORDER — AMIODARONE LOAD VIA INFUSION
150.0000 mg | Freq: Once | INTRAVENOUS | Status: AC
  Administered 2022-09-17: 150 mg via INTRAVENOUS
  Filled 2022-09-17: qty 83.34

## 2022-09-17 MED ORDER — AMIODARONE HCL IN DEXTROSE 360-4.14 MG/200ML-% IV SOLN
60.0000 mg/h | INTRAVENOUS | Status: AC
  Administered 2022-09-17 (×2): 60 mg/h via INTRAVENOUS
  Filled 2022-09-17 (×2): qty 200

## 2022-09-17 MED ORDER — HEPARIN BOLUS VIA INFUSION
4000.0000 [IU] | Freq: Once | INTRAVENOUS | Status: AC
  Administered 2022-09-17: 4000 [IU] via INTRAVENOUS
  Filled 2022-09-17: qty 4000

## 2022-09-17 NOTE — TOC Benefit Eligibility Note (Signed)
Pharmacy Patient Advocate Encounter  Insurance verification completed.    The patient is insured through [employer]  Ran test claim for Eliquis 5 mg and the current 30 day co-pay is $47.00.   This test claim was processed through [facility]- copay amounts may vary at other pharmacies due to pharmacy/plan contracts, or as the patient moves through the different stages of their insurance plan.    Roland Earl, CPHT Pharmacy Patient Advocate Specialist [facility] Patient Advocate Team Direct Number: [phone]  Fax: [phone]

## 2022-09-17 NOTE — Consult Note (Addendum)
ANTICOAGULATION CONSULT NOTE  Pharmacy Consult for Heparin Infusion Indication: atrial fibrillation  Allergies  Allergen Reactions   Amlodipine     Ankle swelling    Oxycodone Other (See Comments)    hallucinated    Patient Measurements: Height: 5\' 5"  (165.1 cm) Weight: 99.5 kg (219 lb 5.7 oz) IBW/kg (Calculated) : 57 Heparin Dosing Weight: 79.7 kg  Vital Signs: Temp: 98.6 F (37 C) (06/27 1632) Temp Source: Oral (06/27 0606) BP: 108/80 (06/27 1632) Pulse Rate: 127 (06/27 1632)  Labs: Recent Labs    09/16/22 1644 09/16/22 1849 09/17/22 0504 09/17/22 0844 09/17/22 0938 09/17/22 1120 09/17/22 1707  HGB 15.3*  --  14.5  --   --   --   --   HCT 47.2*  --  44.6  --   --   --   --   PLT 208  --  165  --   --   --   --   APTT  --   --   --   --  >200*  --   --   HEPARINUNFRC  --   --   --   --   --   --  0.53  CREATININE  --  0.77 0.74  --   --   --   --   TROPONINIHS  --   --   --  58*  --  48*  --      Estimated Creatinine Clearance: 68.8 mL/min (by C-G formula based on SCr of 0.74 mg/dL).   Medical History: Past Medical History:  Diagnosis Date   Anxiety    Depression    Family history of adverse reaction to anesthesia    sister had a reaction when had a baby   GERD (gastroesophageal reflux disease)    Hyperlipidemia 12/21/2005   Hypertension    Hypothyroidism    Insomnia    OAB (overactive bladder)    Obstructive sleep apnea    Osteoarthrosis    Vitamin D deficiency     Medications:  Medications Prior to Admission  Medication Sig Dispense Refill Last Dose   acetaminophen (TYLENOL) 650 MG CR tablet Take 1,300 mg by mouth every 8 (eight) hours as needed for pain.      amLODipine (NORVASC) 5 MG tablet Take 5 mg by mouth daily.      buPROPion (WELLBUTRIN XL) 150 MG 24 hr tablet Take by mouth.      Carboxymethylcellulose Sodium (THERATEARS OP) Place 1 drop into both eyes daily as needed (dry eyes).      celecoxib (CELEBREX) 200 MG capsule Take 200 mg  by mouth 2 (two) times daily.      gabapentin (NEURONTIN) 100 MG capsule Take by mouth.      levothyroxine (SYNTHROID) 88 MCG tablet Take 1 tablet (88 mcg total) by mouth daily. 90 tablet 2    losartan-hydrochlorothiazide (HYZAAR) 50-12.5 MG tablet TAKE 1 TABLET BY MOUTH EVERY DAY 30 tablet 0    lovastatin (MEVACOR) 40 MG tablet TAKE 1 TABLET BY MOUTH EVERYDAY AT BEDTIME 90 tablet 3    metoprolol succinate (TOPROL-XL) 100 MG 24 hr tablet TAKE 1 TABLET BY MOUTH DAILY. TAKE WITH OR IMMEDIATELY FOLLOWING A MEAL. 90 tablet 0    mirabegron ER (MYRBETRIQ) 25 MG TB24 tablet Take 1 tablet (25 mg total) by mouth daily. 30 tablet 0    NON FORMULARY CPAP      omeprazole (PRILOSEC) 20 MG capsule TAKE 1 CAPSULE BY MOUTH EVERY DAY 90  capsule 1    senna (SENOKOT) 8.6 MG tablet Take 2 tablets (17.2 mg total) by mouth at bedtime. 180 tablet 3    traMADol (ULTRAM) 50 MG tablet Take 50 mg by mouth 2 (two) times daily as needed.      traZODone (DESYREL) 50 MG tablet Take 2 tablets (100 mg total) by mouth daily. 180 tablet 0    venlafaxine XR (EFFEXOR-XR) 75 MG 24 hr capsule TAKE 1 CAPSULE BY MOUTH DAILY WITH BREAKFAST. 90 capsule 0    Vitamin D, Ergocalciferol, (DRISDOL) 1.25 MG (50000 UNIT) CAPS capsule TAKE 1 CAPSULE (50,000 UNITS TOTAL) BY MOUTH EVERY 7 (SEVEN) DAYS 12 capsule 1    Vitamin D3 (VITAMIN D) 25 MCG tablet Take 1,000 Units by mouth daily.      Scheduled:   buPROPion  300 mg Oral Daily   vitamin D3  1,000 Units Oral Daily   diltiazem  15 mg Oral Q8H   furosemide  40 mg Intravenous Daily   gabapentin  200 mg Oral BID   levothyroxine  88 mcg Oral Q0600   metoprolol succinate  100 mg Oral Daily   mirabegron ER  25 mg Oral Daily   pantoprazole  40 mg Oral Daily   pravastatin  40 mg Oral q1800   traZODone  100 mg Oral QHS   Infusions:   amiodarone 30 mg/hr (09/17/22 1457)   heparin 1,100 Units/hr (09/17/22 0901)   PRN: acetaminophen **OR** acetaminophen, magnesium hydroxide, ondansetron **OR**  ondansetron (ZOFRAN) IV, senna, traMADol Anti-infectives (From admission, onward)    None       Assessment: Robin Espinoza is a 77 y.o. female found to be in atrial flutter. PMH significant for HTN, HLD, hypothyroidism. Patient was not on [facility] PTA per chart review. CHADSVASC 4-5. Will need to follow with echo. She received enoxaparin 40 mg x1 on 6/27 @ 0100. Pharmacy has been consulted to initiate and manage heparin infusion.   Baseline Labs: aPTT >200, Hgb 14.5, Hct 44.6, Plt 165   Goal of Therapy:  Heparin level 0.3-0.7 units/ml Monitor platelets by anticoagulation protocol: Yes   Date Time HL Rate/Comment  6/27 1707 0.53 1100/therapeutic x1  Plan:  Continue heparin infusion at 1100 units/hr Check HL in 8 hours  Continue to monitor H&H and platelets daily while on heparin infusion   Celene Squibb, PharmD Clinical Pharmacist 09/17/2022 5:44 PM

## 2022-09-17 NOTE — Progress Notes (Signed)
Progress Note   Patient: Robin Espinoza MWU:[id number] DOB: 06-20-1945 DOA: 09/16/2022     1 DOS: the patient was seen and examined on 09/17/2022   Brief hospital course: Taken from H&P.  Robin Espinoza is a 77 y.o. Caucasian female with medical history significant for anxiety, depression, hypertension, dyslipidemia, hypothyroidism, overactive bladder, obstructive sleep apnea and osteoarthritis, who presented to the emergency room with acute onset of fall while using her rollator today.  She stated that she felt lightheaded and has been feeling fatigued lately.  She hit the right side of her face with subsequent forehead laceration as well as had a right hand skin tear.  She denies any loss of consciousness.   ED Course: When she came to the ER BP was 116/91 with a heart rate of 143 and  respiratory rate of 20, temperature 98.1 and pulse currently 96% on room air.  Labs revealed hypokalemia of 2.9, BUN of 27 and albumin 3 with total protein of 5.4.  BNP was 454.5.  CBC showed hemoconcentration and macrocytosis.  TSH was 4.76 and free T4 1.22 both minimally elevated. EKG as reviewed by me : EKG showed atrial fibrillation with rapid ventricular response of 125 with low voltage QRS. Imaging: Portable chest x-ray showed no acute cardiopulmonary disease. Noncontrasted head CT scan showed no acute intracranial abnormalities. C-spine CT showed no acute findings.  There were multilevel severe degenerative changes of the spine with associated multilevel posterior disc osteophyte complex formations and associated osseous neuroforaminal stenosis at both C3-C4 and C4-C5 levels. Lumbar spine CT without contrast revealed the following: 1. No acute displaced fracture or traumatic listhesis of the lumbar spine. 2. Multilevel posterior disc osteophyte complex formation with associated left L4-L5 as well as T11-T12 and T10-T11 severe osseous neural foraminal stenosis. Suggestion of at least moderate to  severe osseous neural foraminal stenosis at the L1-L2 and T10-T11. 3.  Aortic Atherosclerosis (ICD10-I70.0). 4. 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 Recommend follow-up ultrasound every 3 years. This recommendation follows ACR consensus guidelines.  Patient received IV diltiazem bolus followed by drip.  Cardiology was consulted.  6/27: Remained in RVR with atrial flutter.  Potassium improved to 3.4, magnesium 1.9.  CHA2DS2-VASc score of 5, starting on heparin.  Cardiology transitioned her to Toprol and diltiazem, discontinued losartan and HCTZ to make room for these medications.  She was also started on IV amiodarone for rate control.  Patient does has an history of falls 2-3 times every year but due to high score she will need anticoagulation.  Might need DCCV.    Assessment and Plan: * Atrial fibrillation with rapid ventricular response (HCC) Patient remained in RVR on diltiazem infusion.  Cardiology switched to p.o. diltiazem and Toprol, also added IV amiodarone for better rate control. Had atrial flutter with RVR. Echocardiogram pending. -If rate remained uncontrolled might need DCCV per cardiology -Started on heparin infusion-will need p.o. anticoagulation on discharge. -Cardiology is on board-appreciate their help  Elevated brain natriuretic peptide (BNP) level No prior history of heart failure.  Echocardiogram ordered-pending -Cardiology started her on IV Lasix  Hypokalemia Improved, potassium borderline low at 3.4 today.  Magnesium 1.9 -Replete potassium and monitor  Hypothyroidism - We will continue Synthroid.  GERD without esophagitis - We will continue PPI therapy.  Dyslipidemia - We will continue statin therapy.  Essential hypertension - Home losartan and HCTZ was discontinued by cardiology to make a room for rate controlling medications.  Currently on diltiazem, metoprolol and Lasix  Depression, recurrent (HCC) -  We will continue her Wellbutrin XL,  Effexor XR and Cymbalta.   Subjective: Patient was seen and examined today.  She was complaining of being intermittently incontinent and asking to replace pure wick stating that she does not want to have an accident while going to the bedside commode.  No chest pain.  Physical Exam: Vitals:   09/17/22 0400 09/17/22 0606 09/17/22 1219 09/17/22 1456  BP: 114/81 123/86 (!) 88/75 (!) 125/92  Pulse: 72 (!) 132 (!) 41   Resp: 18 18 18    Temp:  98 F (36.7 C) 98.2 F (36.8 C)   TempSrc:  Oral    SpO2: 96% 99% 97%   Weight:  99.5 kg    Height:  5\' 5"  (1.651 m)     General.  Obese elderly lady, in no acute distress. Pulmonary.  Lungs clear bilaterally, normal respiratory effort. CV.  Irregularly irregular. Abdomen.  Soft, nontender, nondistended, BS positive. CNS.  Alert and oriented .  No focal neurologic deficit. Extremities.  Trace LE edema, no cyanosis, pulses intact and symmetrical. Psychiatry.  Judgment and insight appears normal.   Data Reviewed: Prior data reviewed  Family Communication: Husband at bedside  Disposition: Status is: Inpatient Remains inpatient appropriate because: Severity of illness  Planned Discharge Destination: Home  DVT prophylaxis.  Heparin infusion Time spent: 50 minutes  This record has been created using Conservation officer, historic buildings. Errors have been sought and corrected,but may not always be located. Such creation errors do not reflect on the standard of care.   Author: Arnetha Courser, MD 09/17/2022 3:01 PM  For on call review [email or web address].

## 2022-09-17 NOTE — Consult Note (Addendum)
ANTICOAGULATION CONSULT NOTE - Initial Consult  Pharmacy Consult for Heparin Indication: atrial fibrillation  Allergies  Allergen Reactions   Amlodipine     Ankle swelling    Oxycodone Other (See Comments)    hallucinated    Patient Measurements: Height: 5\' 5"  (165.1 cm) Weight: 99.5 kg (219 lb 5.7 oz) IBW/kg (Calculated) : 57 Heparin Dosing Weight: 79.7 kg  Vital Signs: Temp: 98 F (36.7 C) (06/27 0606) Temp Source: Oral (06/27 0606) BP: 123/86 (06/27 0606) Pulse Rate: 132 (06/27 0606)  Labs: Recent Labs    09/16/22 1644 09/16/22 1849 09/17/22 0504  HGB 15.3*  --  14.5  HCT 47.2*  --  44.6  PLT 208  --  165  CREATININE  --  0.77 0.74    Estimated Creatinine Clearance: 68.8 mL/min (by C-G formula based on SCr of 0.74 mg/dL).   Medical History: Past Medical History:  Diagnosis Date   Anxiety    Depression    Family history of adverse reaction to anesthesia    sister had a reaction when had a baby   GERD (gastroesophageal reflux disease)    Hyperlipidemia 12/21/2005   Hypertension    Hypothyroidism    Insomnia    OAB (overactive bladder)    Obstructive sleep apnea    Osteoarthrosis    Vitamin D deficiency     Medications:  Medications Prior to Admission  Medication Sig Dispense Refill Last Dose   acetaminophen (TYLENOL) 650 MG CR tablet Take 1,300 mg by mouth every 8 (eight) hours as needed for pain.      amLODipine (NORVASC) 5 MG tablet Take 5 mg by mouth daily.      buPROPion (WELLBUTRIN XL) 150 MG 24 hr tablet Take by mouth.      Carboxymethylcellulose Sodium (THERATEARS OP) Place 1 drop into both eyes daily as needed (dry eyes).      celecoxib (CELEBREX) 200 MG capsule Take 200 mg by mouth 2 (two) times daily.      gabapentin (NEURONTIN) 100 MG capsule Take by mouth.      levothyroxine (SYNTHROID) 88 MCG tablet Take 1 tablet (88 mcg total) by mouth daily. 90 tablet 2    losartan-hydrochlorothiazide (HYZAAR) 50-12.5 MG tablet TAKE 1 TABLET BY  MOUTH EVERY DAY 30 tablet 0    lovastatin (MEVACOR) 40 MG tablet TAKE 1 TABLET BY MOUTH EVERYDAY AT BEDTIME 90 tablet 3    metoprolol succinate (TOPROL-XL) 100 MG 24 hr tablet TAKE 1 TABLET BY MOUTH DAILY. TAKE WITH OR IMMEDIATELY FOLLOWING A MEAL. 90 tablet 0    mirabegron ER (MYRBETRIQ) 25 MG TB24 tablet Take 1 tablet (25 mg total) by mouth daily. 30 tablet 0    NON FORMULARY CPAP      omeprazole (PRILOSEC) 20 MG capsule TAKE 1 CAPSULE BY MOUTH EVERY DAY 90 capsule 1    senna (SENOKOT) 8.6 MG tablet Take 2 tablets (17.2 mg total) by mouth at bedtime. 180 tablet 3    traMADol (ULTRAM) 50 MG tablet Take 50 mg by mouth 2 (two) times daily as needed.      traZODone (DESYREL) 50 MG tablet Take 2 tablets (100 mg total) by mouth daily. 180 tablet 0    venlafaxine XR (EFFEXOR-XR) 75 MG 24 hr capsule TAKE 1 CAPSULE BY MOUTH DAILY WITH BREAKFAST. 90 capsule 0    Vitamin D, Ergocalciferol, (DRISDOL) 1.25 MG (50000 UNIT) CAPS capsule TAKE 1 CAPSULE (50,000 UNITS TOTAL) BY MOUTH EVERY 7 (SEVEN) DAYS 12 capsule 1  Vitamin D3 (VITAMIN D) 25 MCG tablet Take 1,000 Units by mouth daily.      Scheduled:   buPROPion  300 mg Oral Daily   vitamin D3  1,000 Units Oral Daily   diltiazem  15 mg Oral Q8H   enoxaparin (LOVENOX) injection  40 mg Subcutaneous Q24H   furosemide  40 mg Intravenous Daily   gabapentin  200 mg Oral BID   losartan  50 mg Oral Daily   And   hydrochlorothiazide  12.5 mg Oral Daily   levothyroxine  88 mcg Oral Q0600   metoprolol succinate  100 mg Oral Daily   mirabegron ER  25 mg Oral Daily   pantoprazole  40 mg Oral Daily   potassium chloride  40 mEq Oral Once   pravastatin  40 mg Oral q1800   traZODone  100 mg Oral QHS   Infusions:   amiodarone 60 mg/hr (09/17/22 0629)   Followed by   amiodarone     PRN: acetaminophen **OR** acetaminophen, magnesium hydroxide, ondansetron **OR** ondansetron (ZOFRAN) IV, senna, traMADol Anti-infectives (From admission, onward)    None        Assessment: 77 year old female with HTN, HLD, hypothyroidism. Pt found to be in atrial flutter. NO DOAC PTA. CHADSVASC 4-5. Will need to follow with echo. Pt received enoxaparin 40 mg x 1 6/27 @ 0100.   Goal of Therapy:  Heparin level 0.3-0.7 units/ml Monitor platelets by anticoagulation protocol: Yes   Plan:  Give 4000 units bolus x 1 Start heparin infusion at 1100 units/hr Check anti-Xa level in 8 hours and daily while on heparin Continue to monitor H&H and platelets  Ronnald Ramp, PharmD, BCPS 09/17/2022,8:37 AM

## 2022-09-17 NOTE — Consult Note (Signed)
Cardiology Consultation   Patient ID: HUNTER PINKARD MRN: [id number]; DOB: 02-03-1946  Admit date: 09/16/2022 Date of Consult: 09/17/2022  PCP:  Lauro Regulus, MD   [facility] Providers Cardiologist: New   Patient Profile:   Robin Espinoza is a 77 y.o. female with a hx of anxiety, depression, hypertension, dyslipidemia, hypothyroidism, overactive bladder, OSA, OA who is being seen 09/17/2022 for the evaluation of palpitations at the request of Dr. Nelson Chimes.  History of Present Illness:   Ms. Peake has not been seen by cardiology in the past. She reports no h/o of MI, stenting, CHF or arrhythmia. Said her father died of a heart attack at 81. She has no h/o stroke. 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 She reports worsening fatigue and weakness for the last year.   Patient presented to the ER at [facility] 09/16/2022 with fall and palpitations. She fell while using her walker later earlier that day.  She felt lightheaded and dizzy before falling. NO heart racing or palpitations. She does report fatigue. She hit the right side of her face with subsequent forehead laceration and a right hand skin tear.  She denied loss of consciousness, chest pain, shortness of breath.  No dyspnea, orthopnea.  She reports chronic lower leg edema.  In the ER blood pressure 116/91 with a heart rate of 143, respiratory rate 20, afebrile, 96% on room air.  Labs showed hypokalemia of 2.9, BUN 27, Scr 0.77, albumin 3, BNP 454, Hgb 15.3. Mag 1.9. TSH 4.76, free T4 1.22.  EKG showed A-fib with RVR with a heart rate of 125 bpm.  Chest x-ray showed no active disease.  She was given IV Dilt bolus followed by drip, Percocet, IV potassium and admitted for further workup.  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 Authorizing Provider  acetaminophen (TYLENOL) 650 MG CR tablet Take 1,300 mg by mouth every 8 (eight) hours as needed for pain.    [provider]  amLODipine (NORVASC) 5 MG tablet Take 5 mg by mouth daily.    [provider]  buPROPion (WELLBUTRIN XL) 150 MG 24 hr tablet Take by mouth. 03/26/22 03/26/23  [provider]  Carboxymethylcellulose Sodium (THERATEARS OP) Place 1 drop into both eyes daily as needed (dry eyes).    [provider]  celecoxib (CELEBREX) 200 MG capsule Take 200 mg by mouth 2 (two) times daily. 06/14/22   [provider]  gabapentin (NEURONTIN) 100 MG capsule Take by mouth.    [provider]  levothyroxine (SYNTHROID) 88 MCG  tablet Take 1 tablet (88 mcg total) by mouth daily. 02/19/22   Jacky Kindle, FNP  losartan-hydrochlorothiazide (HYZAAR) 50-12.5 MG tablet TAKE 1 TABLET BY MOUTH EVERY DAY 06/24/22   Merita Norton T, FNP  lovastatin (MEVACOR) 40 MG tablet TAKE 1 TABLET BY MOUTH EVERYDAY AT BEDTIME 12/06/20   Chrismon, Jodell Cipro, PA-C  metoprolol succinate (TOPROL-XL) 100 MG 24 hr tablet TAKE 1 TABLET BY MOUTH DAILY. TAKE WITH OR IMMEDIATELY FOLLOWING A MEAL. 07/13/22   Jacky Kindle, FNP  mirabegron ER (MYRBETRIQ) 25 MG TB24 tablet Take 1 tablet (25 mg total) by mouth daily. 08/11/22   Alfredo Martinez, MD  NON FORMULARY CPAP 08/04/06   [provider]  omeprazole (PRILOSEC) 20 MG capsule TAKE 1 CAPSULE BY MOUTH EVERY DAY 02/19/22   Jacky Kindle, FNP  senna (SENOKOT) 8.6 MG tablet Take 2 tablets (17.2 mg total) by mouth at bedtime. 01/30/21   Jacky Kindle, FNP  traMADol (ULTRAM) 50 MG tablet Take 50 mg by mouth 2 (two) times daily as needed. 01/27/21   [provider]  traZODone (DESYREL) 50 MG tablet Take 2 tablets (100 mg total) by mouth daily. 04/24/21   Jacky Kindle, FNP  venlafaxine XR (EFFEXOR-XR) 75 MG 24 hr capsule TAKE 1 CAPSULE BY MOUTH DAILY WITH BREAKFAST. 11/10/21   Merita Norton T, FNP  Vitamin D, Ergocalciferol, (DRISDOL) 1.25 MG (50000 UNIT) CAPS capsule TAKE 1 CAPSULE (50,000 UNITS TOTAL) BY MOUTH EVERY 7 (SEVEN) DAYS 08/05/20   Bacigalupo, Marzella Schlein, MD  Vitamin D3 (VITAMIN D) 25 MCG tablet Take 1,000 Units by mouth daily.    [provider]    Inpatient Medications: Scheduled Meds:  amLODipine  5 mg Oral Daily   buPROPion  300 mg Oral Daily   vitamin D3  1,000 Units Oral Daily   diltiazem  15 mg Oral Q8H   enoxaparin (LOVENOX) injection  40 mg Subcutaneous Q24H   furosemide  40 mg Intravenous Daily   gabapentin  200 mg Oral BID   losartan  50 mg Oral Daily   And   hydrochlorothiazide  12.5 mg Oral Daily   levothyroxine  88 mcg Oral Q0600   metoprolol succinate  100 mg Oral Daily   mirabegron ER  25 mg Oral Daily   pantoprazole  40 mg Oral Daily   pravastatin  40 mg Oral q1800   traZODone  100 mg Oral QHS   Continuous Infusions:  amiodarone 60 mg/hr (09/17/22 0629)   Followed by   amiodarone     PRN Meds: acetaminophen **OR** acetaminophen, magnesium hydroxide, ondansetron **OR** ondansetron (ZOFRAN) IV, senna, traMADol, traZODone  Allergies:    Allergies  Allergen Reactions   Amlodipine     Ankle swelling    Oxycodone Other (See Comments)    hallucinated    Social History:   Social History   Socioeconomic History   Marital status: Married    Spouse name: Not on  file   Number of children: 3   Years of education: Not on file   Highest education level: Master's degree (e.g., MA, MS, MEng, MEd, MSW, MBA)  Occupational History   Occupation: retired  Tobacco Use   Smoking status: Former   Smokeless tobacco: Never   Tobacco comments:    in Therapist, art Use: Never used  Substance and Sexual Activity   Alcohol use: Yes    Comment: rarely 1 drink   Drug use: No   Sexual  activity: Not on file  Other Topics Concern   Not on file  Social History Narrative   Not on file   Social Determinants of Health   Financial Resource Strain: Low Risk  (04/04/2020)   Overall Financial Resource Strain (CARDIA)    Difficulty of Paying Living Expenses: Not hard at all  Food Insecurity: No Food Insecurity (09/17/2022)   Hunger Vital Sign    Worried About Running Out of Food in the Last Year: Never true    Ran Out of Food in the Last Year: Never true  Transportation Needs: No Transportation Needs (09/17/2022)   PRAPARE - Administrator, Civil Service (Medical): No    Lack of Transportation (Non-Medical): No  Physical Activity: Inactive (04/04/2020)   Exercise Vital Sign    Days of Exercise per Week: 0 days    Minutes of Exercise per Session: 0 min  Stress: No Stress Concern Present (04/04/2020)   [employer] of Occupational Health - Occupational Stress Questionnaire    Feeling of Stress : Not at all  Social Connections: Socially Integrated (04/04/2020)   Social Connection and Isolation Panel [NHANES]    Frequency of Communication with Friends and Family: More than three times a week    Frequency of Social Gatherings with Friends and Family: More than three times a week    Attends Religious Services: More than 4 times per year    Active Member of [employer] or Organizations: Yes    Attends Engineer, structural: More than 4 times per year    Marital Status: Married  Catering manager Violence: Not At Risk (09/17/2022)    Humiliation, Afraid, Rape, and Kick questionnaire    Fear of Current or Ex-Partner: No    Emotionally Abused: No    Physically Abused: No    Sexually Abused: No    Family History:    Family History  Problem Relation Age of Onset   Diabetes Father    Breast cancer Neg Hx      ROS:  Please see the history of present illness.   All other ROS reviewed and negative.     Physical Exam/Data:   Vitals:   09/17/22 0300 09/17/22 0330 09/17/22 0400 09/17/22 0606  BP:  111/80 114/81 123/86  Pulse:  (!) 130 72 (!) 132  Resp: 17 16 18 18   Temp:    98 F (36.7 C)  TempSrc:    Oral  SpO2:  95% 96% 99%  Weight:    99.5 kg  Height:    5\' 5"  (1.651 m)   No intake or output data in the 24 hours ending 09/17/22 0751    09/17/2022    6:06 AM 09/16/2022    4:43 PM 06/22/2022    2:55 PM  Last 3 Weights  Weight (lbs) 219 lb 5.7 oz 219 lb 242 lb  Weight (kg) 99.5 kg 99.338 kg 109.77 kg     Body mass index is 36.5 kg/m.  General:  Well nourished, well developed, in no acute distress HEENT: normal Neck: no JVD Vascular: No carotid bruits; Distal pulses 2+ bilaterally Cardiac:  normal S1, S2; Irreg IRreg; no murmur  Lungs:  clear to auscultation bilaterally, no wheezing, rhonchi or rales  Abd: soft, nontender, no hepatomegaly  Ext: 1+ lower leg edema Musculoskeletal:  No deformities, BUE and BLE strength normal and equal Skin: warm and dry  Neuro:  CNs 2-12 intact, no focal abnormalities noted Psych:  Normal affect   EKG:  The  EKG was personally reviewed and demonstrates:  Aflutter, 132bpm, TWI aVL Telemetry:  Telemetry was personally reviewed and demonstrates:  Aflutter/fib, 130s, PVCs  Relevant CV Studies:  Echo ordered  Laboratory Data:  High Sensitivity Troponin:  No results for input(s): "TROPONINIHS" in the last 720 hours.   Chemistry Recent Labs  Lab 09/16/22 1849 09/17/22 0504  NA 138 138  K 2.9* 3.4*  CL 107 103  CO2 24 27  GLUCOSE 72 128*  BUN 27* 21   CREATININE 0.77 0.74  CALCIUM 7.1* 8.0*  MG 1.9  --   GFRNONAA >60 >60  ANIONGAP 7 8    Recent Labs  Lab 09/16/22 1849  PROT 5.4*  ALBUMIN 3.0*  AST 17  ALT 14  ALKPHOS 72  BILITOT 0.5   Lipids No results for input(s): "CHOL", "TRIG", "HDL", "LABVLDL", "LDLCALC", "CHOLHDL" in the last 168 hours.  Hematology Recent Labs  Lab 09/16/22 1644 09/17/22 0504  WBC 9.8 9.9  RBC 4.68 4.40  HGB 15.3* 14.5  HCT 47.2* 44.6  MCV 100.9* 101.4*  MCH 32.7 33.0  MCHC 32.4 32.5  RDW 13.7 13.6  PLT 208 165   Thyroid  Recent Labs  Lab 09/16/22 1644 09/16/22 1849  TSH 4.765*  --   FREET4  --  1.22*    BNP Recent Labs  Lab 09/16/22 1644  BNP 454.5*    DDimer No results for input(s): "DDIMER" in the last 168 hours.   Radiology/Studies:  CT HEAD WO CONTRAST ( )  Result Date: 09/16/2022 CLINICAL DATA:  Neck trauma (Age >= 65y); Head trauma, minor (Age >= 65y) EXAM: CT HEAD WITHOUT CONTRAST CT CERVICAL SPINE WITHOUT CONTRAST TECHNIQUE: Multidetector CT imaging of the head and cervical spine was performed following the standard protocol without intravenous contrast. Multiplanar CT image reconstructions of the cervical spine were also generated. RADIATION DOSE REDUCTION: This exam was performed according to the departmental dose-optimization program which includes automated exposure control, adjustment of the mA and/or kV according to patient size and/or use of iterative reconstruction technique. COMPARISON:  MRI head 04/08/2021 FINDINGS: CT HEAD FINDINGS Brain: No evidence of large-territorial acute infarction. No parenchymal hemorrhage. No mass lesion. No extra-axial collection. No mass effect or midline shift. No hydrocephalus. Basilar cisterns are patent. Vascular: No hyperdense vessel. Atherosclerotic calcifications are present within the cavernous internal carotid and vertebral arteries. Skull: No acute fracture or focal lesion. Sinuses/Orbits: Paranasal sinuses and mastoid air cells  are clear. Bilateral lens replacement. Otherwise the orbits are unremarkable. Other: None. CT CERVICAL SPINE FINDINGS Alignment: Normal. Skull base and vertebrae: Multilevel severe degenerative changes of the spine with associated multilevel posterior disc osteophyte complex formations. Associated severe osseous neural foraminal stenosis at the bilateral C3-C4 and C4-C5 levels. No severe osseous central canal stenosis. No acute fracture. No aggressive appearing focal osseous lesion or focal pathologic process. Soft tissues and spinal canal: No prevertebral fluid or swelling. No visible canal hematoma. Upper chest: Unremarkable. Other: None. IMPRESSION: 1. No acute intracranial abnormality. 2. No acute displaced fracture or traumatic listhesis of the cervical spine. 3. Multilevel severe degenerative changes of the spine with associated multilevel posterior disc osteophyte complex formations. Associated severe osseous neural foraminal stenosis at the bilateral C3-C4 and C4-C5 levels. Electronically Signed   By: Tish Frederickson M.D.   On: 09/16/2022 18:25   CT Cervical Spine Wo Contrast  Result Date: 09/16/2022 CLINICAL DATA:  Neck trauma (Age >= 65y); Head trauma, minor (Age >= 65y) EXAM: CT HEAD WITHOUT CONTRAST CT CERVICAL SPINE WITHOUT  CONTRAST TECHNIQUE: Multidetector CT imaging of the head and cervical spine was performed following the standard protocol without intravenous contrast. Multiplanar CT image reconstructions of the cervical spine were also generated. RADIATION DOSE REDUCTION: This exam was performed according to the departmental dose-optimization program which includes automated exposure control, adjustment of the mA and/or kV according to patient size and/or use of iterative reconstruction technique. COMPARISON:  MRI head 04/08/2021 FINDINGS: CT HEAD FINDINGS Brain: No evidence of large-territorial acute infarction. No parenchymal hemorrhage. No mass lesion. No extra-axial collection. No mass  effect or midline shift. No hydrocephalus. Basilar cisterns are patent. Vascular: No hyperdense vessel. Atherosclerotic calcifications are present within the cavernous internal carotid and vertebral arteries. Skull: No acute fracture or focal lesion. Sinuses/Orbits: Paranasal sinuses and mastoid air cells are clear. Bilateral lens replacement. Otherwise the orbits are unremarkable. Other: None. CT CERVICAL SPINE FINDINGS Alignment: Normal. Skull base and vertebrae: Multilevel severe degenerative changes of the spine with associated multilevel posterior disc osteophyte complex formations. Associated severe osseous neural foraminal stenosis at the bilateral C3-C4 and C4-C5 levels. No severe osseous central canal stenosis. No acute fracture. No aggressive appearing focal osseous lesion or focal pathologic process. Soft tissues and spinal canal: No prevertebral fluid or swelling. No visible canal hematoma. Upper chest: Unremarkable. Other: None. IMPRESSION: 1. No acute intracranial abnormality. 2. No acute displaced fracture or traumatic listhesis of the cervical spine. 3. Multilevel severe degenerative changes of the spine with associated multilevel posterior disc osteophyte complex formations. Associated severe osseous neural foraminal stenosis at the bilateral C3-C4 and C4-C5 levels. Electronically Signed   By: Tish Frederickson M.D.   On: 09/16/2022 18:25   CT Lumbar Spine Wo Contrast  Result Date: 09/16/2022 CLINICAL DATA:  Back trauma, no prior imaging (Age >= 16y) EXAM: CT LUMBAR SPINE WITHOUT CONTRAST TECHNIQUE: Multidetector CT imaging of the lumbar spine was performed without intravenous contrast administration. Multiplanar CT image reconstructions were also generated. RADIATION DOSE REDUCTION: This exam was performed according to the departmental dose-optimization program which includes automated exposure control, adjustment of the mA and/or kV according to patient size and/or use of iterative  reconstruction technique. COMPARISON:  MRI lumbar spine 04/29/2021 FINDINGS: Segmentation: 5 lumbar type vertebrae. Alignment: Levoscoliosis centered at the L3 level. Vertebrae: Multilevel severe degenerative changes of the spine. Associated multiple level posterior disc osteophyte complex formation. Associated left L4-L5 as well as T11-T12 and T10-T11 severe osseous neural foraminal stenosis. Suggestion of at least moderate to severe osseous neural foraminal stenosis at the L1-L2 and T10-T11. Paraspinal and other soft tissues: Negative. Disc levels: Multilevel intervertebral disc space vacuum phenomenon. Other. Atherosclerotic plaque. Infrarenal abdominal aorta enlarged measuring up to 3.2 x 2.8 cm. IMPRESSION: 1. No acute displaced fracture or traumatic listhesis of the lumbar spine. 2. Multilevel posterior disc osteophyte complex formation with associated left L4-L5 as well as T11-T12 and T10-T11 severe osseous neural foraminal stenosis. Suggestion of at least moderate to severe osseous neural foraminal stenosis at the L1-L2 and T10-T11. 3.  Aortic Atherosclerosis (ICD10-I70.0). 4. 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 Recommend follow-up ultrasound every 3 years. This recommendation follows ACR consensus guidelines: White Paper of the ACR Incidental Findings Committee II on Vascular Findings. J Am Coll Radiol 2013; 10:789-794. Electronically Signed   By: Tish Frederickson M.D.   On: 09/16/2022 18:21   DG Chest Portable 1 View  Result Date: 09/16/2022 CLINICAL DATA:  Short of breath, fell EXAM: PORTABLE CHEST 1 VIEW COMPARISON:  09/22/2019 FINDINGS: Single frontal view of the chest demonstrates  an unremarkable cardiac silhouette. No acute airspace disease, effusion, or pneumothorax. No acute displaced fracture. IMPRESSION: 1. No acute intrathoracic process. Electronically Signed   By: Sharlet Salina M.D.   On: 09/16/2022 18:06     Assessment and Plan:   A-flutter with RVR - presented with  dizziness, weakness, fatigue and a fall (no LOC) found to have rapid aflutter 125bpm started on IV dilt - K low 2.9 on admission. Goal>4 - keep Mag>2 - BP low and she was started on IV amiodarone - Dilt changed to 15mg  Q8H - started on Toprol 100mg  daily - TSH and FT4 mildlyl abnormal>per IM - she reports compliance with CPAP - check an echo - she remains in aflutter with rates in the 130s - she is overall comfortable - CHADSVASC at least 5 (agex2, sex, PAD, HTN) - continue IV heparin for now. Need to be cautious given fall risk - If patient doesn't medically convert, may need TEE/DCCV.  Elevated BNP - BNP 454 - check an echo - albumin 3, may be element of third spacing - started on IV lasix 40mg  BID - stop hydrochlorothiazide - 1+ lower leg edema  Hypokalemia - K 2.9 given IV potassium - today 3.4 - goal>4  Hypothyroidism - synthroid per IM  Dyslipidemia - LDL 109 - pravastatin 40mg  daily  Hypertension - Losartan 50mg  daily, hydrochlorothiazide 12.5mg  daily, - dilt 15 Q8H - Toprol 100mg  daily - stop hydrochlorothiazide with lasix  For questions or updates, please contact [facility] Please consult [email or web address] for contact info under    Signed, Latreshia Beauchaine David Stall, PA-C  09/17/2022 7:51 AM

## 2022-09-17 NOTE — Hospital Course (Addendum)
Taken from H&P.  Robin Espinoza is a 77 y.o. Caucasian female with medical history significant for anxiety, depression, hypertension, dyslipidemia, hypothyroidism, overactive bladder, obstructive sleep apnea and osteoarthritis, who presented to the emergency room with acute onset of fall while using her rollator today.  She stated that she felt lightheaded and has been feeling fatigued lately.  She hit the right side of her face with subsequent forehead laceration as well as had a right hand skin tear.  She denies any loss of consciousness.   ED Course: When she came to the ER BP was 116/91 with a heart rate of 143 and  respiratory rate of 20, temperature 98.1 and pulse currently 96% on room air.  Labs revealed hypokalemia of 2.9, BUN of 27 and albumin 3 with total protein of 5.4.  BNP was 454.5.  CBC showed hemoconcentration and macrocytosis.  TSH was 4.76 and free T4 1.22 both minimally elevated. EKG as reviewed by me : EKG showed atrial fibrillation with rapid ventricular response of 125 with low voltage QRS. Imaging: Portable chest x-ray showed no acute cardiopulmonary disease. Noncontrasted head CT scan showed no acute intracranial abnormalities. C-spine CT showed no acute findings.  There were multilevel severe degenerative changes of the spine with associated multilevel posterior disc osteophyte complex formations and associated osseous neuroforaminal stenosis at both C3-C4 and C4-C5 levels. Lumbar spine CT without contrast revealed the following: 1. No acute displaced fracture or traumatic listhesis of the lumbar spine. 2. Multilevel posterior disc osteophyte complex formation with associated left L4-L5 as well as T11-T12 and T10-T11 severe osseous neural foraminal stenosis. Suggestion of at least moderate to severe osseous neural foraminal stenosis at the L1-L2 and T10-T11. 3.  Aortic Atherosclerosis (ICD10-I70.0). 4. 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 Recommend follow-up  ultrasound every 3 years. This recommendation follows ACR consensus guidelines.  Patient received IV diltiazem bolus followed by drip.  Cardiology was consulted.  6/27: Remained in RVR with atrial flutter.  Potassium improved to 3.4, magnesium 1.9.  CHA2DS2-VASc score of 5, starting on heparin.  Cardiology transitioned her to Toprol and diltiazem, discontinued losartan and HCTZ to make room for these medications.  She was also started on IV amiodarone for rate control.  Patient does has an history of falls 2-3 times every year but due to high score she will need anticoagulation.  Might need DCCV.

## 2022-09-18 ENCOUNTER — Inpatient Hospital Stay (HOSPITAL_COMMUNITY)
Admit: 2022-09-18 | Discharge: 2022-09-18 | Disposition: A | Discharge status: Home / Self Care | Payer: PPO | Attending: Family Medicine | Admitting: Family Medicine

## 2022-09-18 ENCOUNTER — Inpatient Hospital Stay: Payer: PPO

## 2022-09-18 DIAGNOSIS — I4892 Unspecified atrial flutter: Secondary | ICD-10-CM

## 2022-09-18 DIAGNOSIS — I5031 Acute diastolic (congestive) heart failure: Secondary | ICD-10-CM | POA: Diagnosis not present

## 2022-09-18 DIAGNOSIS — I4891 Unspecified atrial fibrillation: Secondary | ICD-10-CM | POA: Diagnosis not present

## 2022-09-18 LAB — VITAMIN B12: Vitamin B-12: 248 pg/mL (ref 180–914)

## 2022-09-18 LAB — FOLATE: Folate: 12.6 ng/mL (ref >5.9)

## 2022-09-18 LAB — ECHOCARDIOGRAM COMPLETE
AR max vel: 1.62 cm2
AV Area VTI: 1.95 cm2
AV Area mean vel: 1.64 cm2
AV Mean grad: 3.5 mmHg
AV Peak grad: 5.7 mmHg
Ao pk vel: 1.19 m/s
Area-P 1/2: 3.99 cm2
Height: 65 in
S' Lateral: 2.8 cm
Weight: 3199.32 oz

## 2022-09-18 LAB — MAGNESIUM: Magnesium: 1.8 mg/dL (ref 1.7–2.4)

## 2022-09-18 LAB — CBC
HCT: 42.1 % (ref 36.0–46.0)
Hemoglobin: 14.2 g/dL (ref 12.0–15.0)
MCH: 33.5 pg (ref 26.0–34.0)
MCHC: 33.7 g/dL (ref 30.0–36.0)
MCV: 99.3 fL (ref 80.0–100.0)
Platelets: 174 10*3/uL (ref 150–400)
RBC: 4.24 MIL/uL (ref 3.87–5.11)
RDW: 13.5 % (ref 11.5–15.5)
WBC: 8.3 10*3/uL (ref 4.0–10.5)
nRBC: 0 % (ref 0.0–0.2)

## 2022-09-18 LAB — BASIC METABOLIC PANEL
Anion gap: 10 (ref 5–15)
BUN: 20 mg/dL (ref 8–23)
CO2: 26 mmol/L (ref 22–32)
Calcium: 8.4 mg/dL — ABNORMAL LOW (ref 8.9–10.3)
Chloride: 101 mmol/L (ref 98–111)
Creatinine, Ser: 1 mg/dL (ref 0.44–1.00)
GFR, Estimated: 58 mL/min — ABNORMAL LOW (ref >60)
Glucose, Bld: 109 mg/dL — ABNORMAL HIGH (ref 70–99)
Potassium: 3.6 mmol/L (ref 3.5–5.1)
Sodium: 137 mmol/L (ref 135–145)

## 2022-09-18 LAB — HEPARIN LEVEL (UNFRACTIONATED): Heparin Unfractionated: 0.42 IU/mL (ref 0.30–0.70)

## 2022-09-18 LAB — CK: Total CK: 27 U/L — ABNORMAL LOW (ref 38–234)

## 2022-09-18 LAB — VITAMIN D 25 HYDROXY (VIT D DEFICIENCY, FRACTURES): Vit D, 25-Hydroxy: 34.73 ng/mL (ref 30–100)

## 2022-09-18 MED ORDER — VITAMIN B-12 1000 MCG PO TABS: 1000.0000 ug | ORAL_TABLET | Freq: Every day | ORAL | Status: DC

## 2022-09-18 MED ORDER — POLYVINYL ALCOHOL 1.4 % OP SOLN
1.0000 [drp] | OPHTHALMIC | Status: DC | PRN
  Administered 2022-09-21 – 2022-09-22 (×2): 1 [drp] via OPHTHALMIC
  Filled 2022-09-18: qty 15

## 2022-09-18 MED ORDER — POTASSIUM CHLORIDE CRYS ER 20 MEQ PO TBCR
40.0000 meq | EXTENDED_RELEASE_TABLET | Freq: Once | ORAL | Status: AC
  Administered 2022-09-18: 40 meq via ORAL
  Filled 2022-09-18: qty 2

## 2022-09-18 MED ORDER — AMIODARONE HCL 200 MG PO TABS
400.0000 mg | ORAL_TABLET | Freq: Two times a day (BID) | ORAL | Status: DC
  Administered 2022-09-18 – 2022-09-23 (×11): 400 mg via ORAL
  Filled 2022-09-18 (×12): qty 2

## 2022-09-18 MED ORDER — CYANOCOBALAMIN 1000 MCG/ML IJ SOLN
1000.0000 ug | Freq: Every day | INTRAMUSCULAR | Status: DC
  Administered 2022-09-18 – 2022-09-23 (×6): 1000 ug via INTRAMUSCULAR
  Filled 2022-09-18 (×6): qty 1

## 2022-09-18 MED ORDER — LEVOTHYROXINE SODIUM 88 MCG PO TABS: 88.0000 ug | ORAL_TABLET | Freq: Every day | ORAL | Status: DC

## 2022-09-18 MED ORDER — MAGNESIUM OXIDE -MG SUPPLEMENT 400 (240 MG) MG PO TABS
400.0000 mg | ORAL_TABLET | Freq: Two times a day (BID) | ORAL | Status: AC
  Administered 2022-09-18 – 2022-09-20 (×6): 400 mg via ORAL
  Filled 2022-09-18 (×6): qty 1

## 2022-09-18 MED ORDER — APIXABAN 5 MG PO TABS
5.0000 mg | ORAL_TABLET | Freq: Two times a day (BID) | ORAL | Status: DC
  Administered 2022-09-18 – 2022-09-23 (×10): 5 mg via ORAL
  Filled 2022-09-18 (×10): qty 1

## 2022-09-18 NOTE — Consult Note (Signed)
ANTICOAGULATION CONSULT NOTE  Pharmacy Consult for Heparin Infusion Indication: atrial fibrillation  Allergies  Allergen Reactions   Amlodipine     Ankle swelling    Oxycodone Other (See Comments)    hallucinated    Patient Measurements: Height: 5\' 5"  (165.1 cm) Weight: 99.5 kg (219 lb 5.7 oz) IBW/kg (Calculated) : 57 Heparin Dosing Weight: 79.7 kg  Vital Signs: Temp: 98.6 F (37 C) (06/27 2321) Temp Source: Oral (06/27 2321) BP: 136/92 (06/27 2321) Pulse Rate: 108 (06/27 2321)  Labs: Recent Labs    09/16/22 1644 09/16/22 1849 09/17/22 0504 09/17/22 0844 09/17/22 0938 09/17/22 1120 09/17/22 1707 09/18/22 0115  HGB 15.3*  --  14.5  --   --   --   --  14.2  HCT 47.2*  --  44.6  --   --   --   --  42.1  PLT 208  --  165  --   --   --   --  174  APTT  --   --   --   --  >200*  --   --   --   HEPARINUNFRC  --   --   --   --   --   --  0.53 0.42  CREATININE  --  0.77 0.74  --   --   --   --  1.00  TROPONINIHS  --   --   --  58*  --  48*  --   --      Estimated Creatinine Clearance: 55 mL/min (by C-G formula based on SCr of 1 mg/dL).   Medical History: Past Medical History:  Diagnosis Date   Anxiety    Depression    Family history of adverse reaction to anesthesia    sister had a reaction when had a baby   GERD (gastroesophageal reflux disease)    Hyperlipidemia 12/21/2005   Hypertension    Hypothyroidism    Insomnia    OAB (overactive bladder)    Obstructive sleep apnea    Osteoarthrosis    Vitamin D deficiency     Medications:  Medications Prior to Admission  Medication Sig Dispense Refill Last Dose   acetaminophen (TYLENOL) 650 MG CR tablet Take 1,300 mg by mouth every 8 (eight) hours as needed for pain.      amLODipine (NORVASC) 5 MG tablet Take 5 mg by mouth daily.      buPROPion (WELLBUTRIN XL) 150 MG 24 hr tablet Take by mouth.      Carboxymethylcellulose Sodium (THERATEARS OP) Place 1 drop into both eyes daily as needed (dry eyes).       celecoxib (CELEBREX) 200 MG capsule Take 200 mg by mouth 2 (two) times daily.      gabapentin (NEURONTIN) 100 MG capsule Take by mouth.      levothyroxine (SYNTHROID) 88 MCG tablet Take 1 tablet (88 mcg total) by mouth daily. 90 tablet 2    losartan-hydrochlorothiazide (HYZAAR) 50-12.5 MG tablet TAKE 1 TABLET BY MOUTH EVERY DAY 30 tablet 0    lovastatin (MEVACOR) 40 MG tablet TAKE 1 TABLET BY MOUTH EVERYDAY AT BEDTIME 90 tablet 3    metoprolol succinate (TOPROL-XL) 100 MG 24 hr tablet TAKE 1 TABLET BY MOUTH DAILY. TAKE WITH OR IMMEDIATELY FOLLOWING A MEAL. 90 tablet 0    mirabegron ER (MYRBETRIQ) 25 MG TB24 tablet Take 1 tablet (25 mg total) by mouth daily. 30 tablet 0    NON FORMULARY CPAP  omeprazole (PRILOSEC) 20 MG capsule TAKE 1 CAPSULE BY MOUTH EVERY DAY 90 capsule 1    senna (SENOKOT) 8.6 MG tablet Take 2 tablets (17.2 mg total) by mouth at bedtime. 180 tablet 3    traMADol (ULTRAM) 50 MG tablet Take 50 mg by mouth 2 (two) times daily as needed.      traZODone (DESYREL) 50 MG tablet Take 2 tablets (100 mg total) by mouth daily. 180 tablet 0    venlafaxine XR (EFFEXOR-XR) 75 MG 24 hr capsule TAKE 1 CAPSULE BY MOUTH DAILY WITH BREAKFAST. 90 capsule 0    Vitamin D, Ergocalciferol, (DRISDOL) 1.25 MG (50000 UNIT) CAPS capsule TAKE 1 CAPSULE (50,000 UNITS TOTAL) BY MOUTH EVERY 7 (SEVEN) DAYS 12 capsule 1    Vitamin D3 (VITAMIN D) 25 MCG tablet Take 1,000 Units by mouth daily.      Scheduled:   buPROPion  300 mg Oral Daily   vitamin D3  1,000 Units Oral Daily   diltiazem  15 mg Oral Q8H   furosemide  40 mg Intravenous Daily   gabapentin  200 mg Oral BID   levothyroxine  88 mcg Oral Q0600   metoprolol succinate  100 mg Oral Daily   mirabegron ER  25 mg Oral Daily   pantoprazole  40 mg Oral Daily   pravastatin  40 mg Oral q1800   traZODone  100 mg Oral QHS   Infusions:   amiodarone 30 mg/hr (09/17/22 2014)   heparin 1,100 Units/hr (09/17/22 2014)   PRN: acetaminophen **OR**  acetaminophen, magnesium hydroxide, ondansetron **OR** ondansetron (ZOFRAN) IV, senna, traMADol Anti-infectives (From admission, onward)    None       Assessment: Robin Espinoza is a 77 y.o. female found to be in atrial flutter. PMH significant for HTN, HLD, hypothyroidism. Patient was not on [facility] PTA per chart review. CHADSVASC 4-5. Will need to follow with echo. She received enoxaparin 40 mg x1 on 6/27 @ 0100. Pharmacy has been consulted to initiate and manage heparin infusion.   Baseline Labs: aPTT >200, Hgb 14.5, Hct 44.6, Plt 165   Goal of Therapy:  Heparin level 0.3-0.7 units/ml Monitor platelets by anticoagulation protocol: Yes   Date Time HL Rate/Comment  6/27 1707 0.53 1100/therapeutic x1 6/28 0115 0.42 Therapeutic x 2  Plan:  Continue heparin infusion at 1100 units/hr Recheck HL daily w/ AM labs Continue to monitor H&H and platelets daily while on heparin infusion   Otelia Sergeant, PharmD, [facility] 09/18/2022 2:05 AM

## 2022-09-18 NOTE — Progress Notes (Signed)
[facility] Progress Note  Patient: Robin Espinoza    ZOX:[id number]  DOA: 09/16/2022     Date of Service: the patient was seen and examined on 09/18/2022  Chief Complaint  Patient presents with   Fall   Palpitations   Brief hospital course: Robin Espinoza is a 77 y.o. Caucasian female with medical history significant for anxiety, depression, hypertension, dyslipidemia, hypothyroidism, overactive bladder, obstructive sleep apnea and osteoarthritis, who presented to the emergency room with acute onset of fall while using her rollator today.  She stated that she felt lightheaded and has been feeling fatigued lately.  She hit the right side of her face with subsequent forehead laceration as well as had a right hand skin tear.  She denies any loss of consciousness.    ED Course: When she came to the ER BP was 116/91 with a heart rate of 143 and  respiratory rate of 20, temperature 98.1 and pulse currently 96% on room air.  Labs revealed hypokalemia of 2.9, BUN of 27 and albumin 3 with total protein of 5.4.  BNP was 454.5.  CBC showed hemoconcentration and macrocytosis.  TSH was 4.76 and free T4 1.22 both minimally elevated. EKG as reviewed by me : EKG showed atrial fibrillation with rapid ventricular response of 125 with low voltage QRS. Imaging: Portable chest x-ray showed no acute cardiopulmonary disease. Noncontrasted head CT scan showed no acute intracranial abnormalities. C-spine CT showed no acute findings.  There were multilevel severe degenerative changes of the spine with associated multilevel posterior disc osteophyte complex formations and associated osseous neuroforaminal stenosis at both C3-C4 and C4-C5 levels. Lumbar spine CT without contrast revealed the following: 1. No acute displaced fracture or traumatic listhesis of the lumbar spine. 2. Multilevel posterior disc osteophyte complex formation with associated left L4-L5 as well as T11-T12 and T10-T11 severe osseous neural  foraminal stenosis. Suggestion of at least moderate to severe osseous neural foraminal stenosis at the L1-L2 and T10-T11. 3.  Aortic Atherosclerosis (ICD10-I70.0). 4. 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 Recommend follow-up ultrasound every 3 years. This recommendation follows ACR consensus guidelines.   Patient received IV diltiazem bolus followed by drip.  Cardiology was consulted.  Assessment and Plan:   Atrial fibrillation with rapid ventricular response  Patient remained in RVR on diltiazem infusion.  Cardiology switched to p.o. diltiazem and Toprol, also added IV amiodarone for better rate control. Had atrial flutter with RVR. TTE LVEF 55 to 60%, moderate LV hypertrophy.  -If rate remained uncontrolled might need DCCV per cardiology -s/p Heparin infusion, transition to Eliquis 5 mg p.o. twice daily -Cardiology following.     Elevated brain natriuretic peptide (BNP) level No prior history of heart failure.  TTE as above -Cardiology started her on IV Lasix   Hypokalemia, Resolved  K 3.6 now -Replete potassium and monitor   Hypothyroidism TSH 4.76, free T41.22 Both elevated Patient is on Synthroid 88 mcg Due to high free T4 level I held Synthroid for now We will continue monitor free T4 level and patient was recommended to follow with endocrinologist as an outpatient. F/u Free T4 level   GERD without esophagitis - We will continue PPI therapy.   Dyslipidemia - We will continue statin therapy.   Essential hypertension - Home losartan and HCTZ was discontinued by cardiology to make a room for rate controlling medications.  Currently on diltiazem, metoprolol and Lasix   Depression, recurrent (HCC) - We will continue her Wellbutrin XL, Effexor XR and Cymbalta.  Vitamin B12 deficiency: B 12 level 248 goal >400, started vitamin B12 1000 mcg IM injection daily during hospital stay, followed by oral supplement.  Follow-up PCP to repeat vitamin B12 level after 3 to  6 months.  Body mass index is 33.27 kg/m.  Interventions:  Diet: Heart healthy DVT Prophylaxis: Therapeutic Anticoagulation with Eliquis    Advance goals of care discussion: DNR  Family Communication: family was present at bedside, at the time of interview.  The pt provided permission to discuss medical plan with the family. Opportunity was given to ask question and all questions were answered satisfactorily.   Disposition:  Pt is from Home, admitted with A-fib with RVR, still has A-fib, which precludes a safe discharge. Discharge to home, when cleared by cardiology.  Subjective: No significant events overnight, patient denies any chest pain or palpitations, no abdominal pain.  Patient has urinary incontinence.  Denies any other complaints.  Physical Exam: General: NAD, lying comfortably Appear in no distress, affect appropriate Eyes: PERRLA ENT: Oral Mucosa Clear, moist  Neck: no JVD,  Cardiovascular: Irregular rhythm, no Murmur,  Respiratory: good respiratory effort, Bilateral Air entry equal and Decreased, no Crackles, no wheezes Abdomen: Bowel Sound present, Soft and no tenderness,  Skin: no rashes Extremities: no Pedal edema, no calf tenderness Neurologic: without any new focal findings Gait not checked due to patient safety concerns  Vitals:   09/17/22 2321 09/18/22 0418 09/18/22 0600 09/18/22 0749  BP: (!) 136/92 (!) 140/96 (!) 142/94 129/82  Pulse: (!) 108 (!) 112  62  Resp: 16 18 14 18   Temp: 98.6 F (37 C) 98.4 F (36.9 C) 98.6 F (37 C) 98 F (36.7 C)  TempSrc: Oral Oral Oral   SpO2: 98% 99%  99%  Weight:  90.7 kg    Height:        Intake/Output Summary (Last 24 hours) at 09/18/2022 1141 Last data filed at 09/18/2022 1102 Gross per 24 hour  Intake 1215.38 ml  Output 750 ml  Net 465.38 ml   Filed Weights   09/16/22 1643 09/17/22 0606 09/18/22 0418  Weight: 99.3 kg 99.5 kg 90.7 kg    Data Reviewed: I have personally reviewed and interpreted daily  labs, tele strips, imagings as discussed above. I reviewed all nursing notes, pharmacy notes, vitals, pertinent old records I have discussed plan of care as described above with RN and patient/family.  CBC: Recent Labs  Lab 09/16/22 1644 09/17/22 0504 09/18/22 0115  WBC 9.8 9.9 8.3  NEUTROABS 7.0  --   --   HGB 15.3* 14.5 14.2  HCT 47.2* 44.6 42.1  MCV 100.9* 101.4* 99.3  PLT 208 165 174   Basic Metabolic Panel: Recent Labs  Lab 09/16/22 1849 09/17/22 0504 09/18/22 0115  NA 138 138 137  K 2.9* 3.4* 3.6  CL 107 103 101  CO2 24 27 26   GLUCOSE 72 128* 109*  BUN 27* 21 20  CREATININE 0.77 0.74 1.00  CALCIUM 7.1* 8.0* 8.4*  MG 1.9  --  1.8    Studies: No results found.  Scheduled Meds:  buPROPion  300 mg Oral Daily   vitamin D3  1,000 Units Oral Daily   diltiazem  15 mg Oral Q8H   furosemide  40 mg Intravenous Daily   gabapentin  200 mg Oral BID   [START ON 09/21/2022] levothyroxine  88 mcg Oral Q0600   magnesium oxide  400 mg Oral BID   metoprolol succinate  100 mg Oral Daily   mirabegron  ER  25 mg Oral Daily   pantoprazole  40 mg Oral Daily   potassium chloride  40 mEq Oral Once   pravastatin  40 mg Oral q1800   traZODone  100 mg Oral QHS   Continuous Infusions:  amiodarone 30 mg/hr (09/18/22 0400)   heparin 1,100 Units/hr (09/18/22 0400)   PRN Meds: acetaminophen **OR** acetaminophen, magnesium hydroxide, ondansetron **OR** ondansetron (ZOFRAN) IV, senna, traMADol  Time spent: 50 minutes  Author: Gillis Santa. MD [facility] 09/18/2022 11:41 AM  To reach On-call, see care teams to locate the attending and reach out to them via [email or web address]. If 7PM-7AM, please contact night-coverage If you still have difficulty reaching the attending provider, please page the [facility] (Director on Call) for [facility] on amion for assistance.

## 2022-09-18 NOTE — Care Management Important Message (Signed)
Important Message  Patient Details  Name: Robin Espinoza MRN: [id number] Date of Birth: 1945-04-28   Medicare Important Message Given:  Yes     [name] 09/18/2022, 10:16 AM

## 2022-09-18 NOTE — Evaluation (Addendum)
Occupational Therapy Evaluation Patient Details Name: Robin Espinoza MRN: [id number] DOB: [birth year] Today's Date: 09/18/2022   History of Present Illness Robin Espinoza is a 77 y.o. Caucasian female with medical history significant for anxiety, depression, hypertension, dyslipidemia, hypothyroidism, overactive bladder, obstructive sleep apnea and osteoarthritis, who presented to the emergency room with acute onset of fall while using her rollator today. She hit the right side of her face with subsequent forehead laceration as well as had a right hand skin tear. Imaging negative for acute fractures   Clinical Impression   Robin Espinoza was seen for OT evaluation this date. Prior to hospital admission, pt was MOD I using 4WW. Pt lives with spouse in level entry home; states spouse could not provide physical assistance. Pt currently requires MIN A bed mobility. MIN A + RW for BSC t/f, assist for lift off. CGA pericare standing. Poor activity tolerance and decreased safety awareness. Pt would benefit from skilled OT to address noted impairments and functional limitations (see below for any additional details). Upon hospital discharge, recommend follow up OT.   Recommendations for follow up therapy are one component of a multi-disciplinary discharge planning process, led by the attending physician.  Recommendations may be updated based on patient status, additional functional criteria and insurance authorization.   Assistance Recommended at Discharge Frequent or constant Supervision/Assistance  Patient can return home with the following A little help with walking and/or transfers;A little help with bathing/dressing/bathroom;Help with stairs or ramp for entrance    Functional Status Assessment  Patient has had a recent decline in their functional status and demonstrates the ability to make significant improvements in function in a reasonable and predictable amount of time.  Equipment Recommendations   BSC/3in1    Recommendations for Other Services       Precautions / Restrictions Precautions Precautions: Fall Restrictions Weight Bearing Restrictions: No      Mobility Bed Mobility Overal bed mobility: Needs Assistance Bed Mobility: Supine to Sit     Supine to sit: Min assist          Transfers Overall transfer level: Needs assistance Equipment used: Rolling walker (2 wheels) Transfers: Sit to/from Stand Sit to Stand: Min assist                  Balance Overall balance assessment: Needs assistance Sitting-balance support: No upper extremity supported, Feet supported Sitting balance-Leahy Scale: Good     Standing balance support: Bilateral upper extremity supported, Reliant on assistive device for balance Standing balance-Leahy Scale: Fair                             ADL either performed or assessed with clinical judgement   ADL Overall ADL's : Needs assistance/impaired                                       General ADL Comments: MIN A + RW for BSC t/f, assist for lift off. CGA pericare standing. MOD A for LB access in sitting.      Pertinent Vitals/Pain Pain Assessment Pain Assessment: No/denies pain     Hand Dominance Right   Extremity/Trunk Assessment Upper Extremity Assessment Upper Extremity Assessment: Generalized weakness   Lower Extremity Assessment Lower Extremity Assessment: Generalized weakness       Communication Communication Communication: No difficulties   Cognition Arousal/Alertness: Awake/alert Behavior During Therapy:  WFL for tasks assessed/performed Overall Cognitive Status: Within Functional Limits for tasks assessed                                        Home Living Family/patient expects to be discharged to:: Private residence Living Arrangements: Spouse/significant other Available Help at Discharge: Family Type of Home: House Home Access: Level entry     Home  Layout: One level               Home Equipment: Agricultural consultant (2 wheels);Rollator (4 wheels);Shower seat;BSC/3in1;Toilet riser;Hospital bed          Prior Functioning/Environment Prior Level of Function : Independent/Modified Independent             Mobility Comments: 2 falls in last 6 months ADLs Comments: incontinent, wears pull ups        OT Problem List: Decreased strength;Decreased range of motion;Decreased activity tolerance;Impaired balance (sitting and/or standing);Decreased safety awareness      OT Treatment/Interventions: Self-care/ADL training;Therapeutic exercise;Energy conservation;DME and/or AE instruction;Therapeutic activities;Patient/family education;Balance training    OT Goals(Current goals can be found in the care plan section) Acute Rehab OT Goals Patient Stated Goal: to go home OT Goal Formulation: With patient Time For Goal Achievement: 10/02/22 Potential to Achieve Goals: Good ADL Goals Pt Will Perform Grooming: with modified independence;standing Pt Will Perform Lower Body Dressing: sit to/from stand;with modified independence Pt Will Transfer to Toilet: with modified independence;ambulating;regular height toilet  OT Frequency: Min 1X/week    Co-evaluation              AM-PAC OT "6 Clicks" Daily Activity     Outcome Measure Help from another person eating meals?: None Help from another person taking care of personal grooming?: A Little Help from another person toileting, which includes using toliet, bedpan, or urinal?: A Little Help from another person bathing (including washing, rinsing, drying)?: A Lot Help from another person to put on and taking off regular upper body clothing?: A Little Help from another person to put on and taking off regular lower body clothing?: A Lot 6 Click Score: 17   End of Session Equipment Utilized During Treatment: Rolling walker (2 wheels)  Activity Tolerance: Patient tolerated treatment  well Patient left: in chair;with call bell/phone within reach  OT Visit Diagnosis: Other abnormalities of gait and mobility (R26.89);Muscle weakness (generalized) (M62.81)                Time: 1610-9604 OT Time Calculation (min): 20 min Charges:  OT General Charges $OT Visit: 1 Visit OT Evaluation $OT Eval Moderate Complexity: 1 Mod  Kathie Dike, M.S. OTR/L  09/18/22, 3:22 PM  ascom [phone]

## 2022-09-18 NOTE — Progress Notes (Signed)
Rounding Note    Patient Name: Robin Espinoza Date of Encounter: 09/18/2022  [facility] Cardiologist: New  Subjective   Patient converted to NSR last night. She is overall feeling better. She is working with OT. No chest pain.   Inpatient Medications    Scheduled Meds:  buPROPion  300 mg Oral Daily   vitamin D3  1,000 Units Oral Daily   diltiazem  15 mg Oral Q8H   furosemide  40 mg Intravenous Daily   gabapentin  200 mg Oral BID   [START ON 09/21/2022] levothyroxine  88 mcg Oral Q0600   magnesium oxide  400 mg Oral BID   metoprolol succinate  100 mg Oral Daily   mirabegron ER  25 mg Oral Daily   pantoprazole  40 mg Oral Daily   potassium chloride  40 mEq Oral Once   pravastatin  40 mg Oral q1800   traZODone  100 mg Oral QHS   Continuous Infusions:  amiodarone 30 mg/hr (09/18/22 0400)   heparin 1,100 Units/hr (09/18/22 0400)   PRN Meds: acetaminophen **OR** acetaminophen, magnesium hydroxide, ondansetron **OR** ondansetron (ZOFRAN) IV, senna, traMADol   Vital Signs    Vitals:   09/18/22 0418 09/18/22 0600 09/18/22 0749 09/18/22 1159  BP: (!) 140/96 (!) 142/94 129/82 120/74  Pulse: (!) 112  62 (!) 58  Resp: 18 14 18 19   Temp: 98.4 F (36.9 C) 98.6 F (37 C) 98 F (36.7 C) 98.3 F (36.8 C)  TempSrc: Oral Oral    SpO2: 99%  99% 98%  Weight: 90.7 kg     Height:        Intake/Output Summary (Last 24 hours) at 09/18/2022 1257 Last data filed at 09/18/2022 1102 Gross per 24 hour  Intake 1215.38 ml  Output 750 ml  Net 465.38 ml      09/18/2022    4:18 AM 09/17/2022    6:06 AM 09/16/2022    4:43 PM  Last 3 Weights  Weight (lbs) 199 lb 15.3 oz 219 lb 5.7 oz 219 lb  Weight (kg) 90.7 kg 99.5 kg 99.338 kg      Telemetry    Aflutter>NSR.  - Personally Reviewed  ECG    No new - Personally Reviewed  Physical Exam   GEN: No acute distress.   Neck: No JVD Cardiac: RRR, no murmurs, rubs, or gallops.  Respiratory: Clear to auscultation  bilaterally. GI: Soft, nontender, non-distended  MS: No edema; No deformity. Neuro:  Nonfocal  Psych: Normal affect   Labs    High Sensitivity Troponin:   Recent Labs  Lab 09/17/22 0844 09/17/22 1120  TROPONINIHS 58* 48*     Chemistry Recent Labs  Lab 09/16/22 1849 09/17/22 0504 09/18/22 0115  NA 138 138 137  K 2.9* 3.4* 3.6  CL 107 103 101  CO2 24 27 26   GLUCOSE 72 128* 109*  BUN 27* 21 20  CREATININE 0.77 0.74 1.00  CALCIUM 7.1* 8.0* 8.4*  MG 1.9  --  1.8  PROT 5.4*  --   --   ALBUMIN 3.0*  --   --   AST 17  --   --   ALT 14  --   --   ALKPHOS 72  --   --   BILITOT 0.5  --   --   GFRNONAA >60 >60 58*  ANIONGAP 7 8 10     Lipids No results for input(s): "CHOL", "TRIG", "HDL", "LABVLDL", "LDLCALC", "CHOLHDL" in the last 168 hours.  Hematology Recent Labs  Lab 09/16/22 1644 09/17/22 0504 09/18/22 0115  WBC 9.8 9.9 8.3  RBC 4.68 4.40 4.24  HGB 15.3* 14.5 14.2  HCT 47.2* 44.6 42.1  MCV 100.9* 101.4* 99.3  MCH 32.7 33.0 33.5  MCHC 32.4 32.5 33.7  RDW 13.7 13.6 13.5  PLT 208 165 174   Thyroid  Recent Labs  Lab 09/16/22 1644 09/16/22 1849  TSH 4.765*  --   FREET4  --  1.22*    BNP Recent Labs  Lab 09/16/22 1644  BNP 454.5*    DDimer No results for input(s): "DDIMER" in the last 168 hours.   Radiology    CT HEAD WO CONTRAST ( )  Result Date: 09/16/2022 CLINICAL DATA:  Neck trauma (Age >= 65y); Head trauma, minor (Age >= 65y) EXAM: CT HEAD WITHOUT CONTRAST CT CERVICAL SPINE WITHOUT CONTRAST TECHNIQUE: Multidetector CT imaging of the head and cervical spine was performed following the standard protocol without intravenous contrast. Multiplanar CT image reconstructions of the cervical spine were also generated. RADIATION DOSE REDUCTION: This exam was performed according to the departmental dose-optimization program which includes automated exposure control, adjustment of the mA and/or kV according to patient size and/or use of iterative  reconstruction technique. COMPARISON:  MRI head 04/08/2021 FINDINGS: CT HEAD FINDINGS Brain: No evidence of large-territorial acute infarction. No parenchymal hemorrhage. No mass lesion. No extra-axial collection. No mass effect or midline shift. No hydrocephalus. Basilar cisterns are patent. Vascular: No hyperdense vessel. Atherosclerotic calcifications are present within the cavernous internal carotid and vertebral arteries. Skull: No acute fracture or focal lesion. Sinuses/Orbits: Paranasal sinuses and mastoid air cells are clear. Bilateral lens replacement. Otherwise the orbits are unremarkable. Other: None. CT CERVICAL SPINE FINDINGS Alignment: Normal. Skull base and vertebrae: Multilevel severe degenerative changes of the spine with associated multilevel posterior disc osteophyte complex formations. Associated severe osseous neural foraminal stenosis at the bilateral C3-C4 and C4-C5 levels. No severe osseous central canal stenosis. No acute fracture. No aggressive appearing focal osseous lesion or focal pathologic process. Soft tissues and spinal canal: No prevertebral fluid or swelling. No visible canal hematoma. Upper chest: Unremarkable. Other: None. IMPRESSION: 1. No acute intracranial abnormality. 2. No acute displaced fracture or traumatic listhesis of the cervical spine. 3. Multilevel severe degenerative changes of the spine with associated multilevel posterior disc osteophyte complex formations. Associated severe osseous neural foraminal stenosis at the bilateral C3-C4 and C4-C5 levels. Electronically Signed   By: Tish Frederickson M.D.   On: 09/16/2022 18:25   CT Cervical Spine Wo Contrast  Result Date: 09/16/2022 CLINICAL DATA:  Neck trauma (Age >= 65y); Head trauma, minor (Age >= 65y) EXAM: CT HEAD WITHOUT CONTRAST CT CERVICAL SPINE WITHOUT CONTRAST TECHNIQUE: Multidetector CT imaging of the head and cervical spine was performed following the standard protocol without intravenous contrast.  Multiplanar CT image reconstructions of the cervical spine were also generated. RADIATION DOSE REDUCTION: This exam was performed according to the departmental dose-optimization program which includes automated exposure control, adjustment of the mA and/or kV according to patient size and/or use of iterative reconstruction technique. COMPARISON:  MRI head 04/08/2021 FINDINGS: CT HEAD FINDINGS Brain: No evidence of large-territorial acute infarction. No parenchymal hemorrhage. No mass lesion. No extra-axial collection. No mass effect or midline shift. No hydrocephalus. Basilar cisterns are patent. Vascular: No hyperdense vessel. Atherosclerotic calcifications are present within the cavernous internal carotid and vertebral arteries. Skull: No acute fracture or focal lesion. Sinuses/Orbits: Paranasal sinuses and mastoid air cells are clear. Bilateral lens replacement. Otherwise  the orbits are unremarkable. Other: None. CT CERVICAL SPINE FINDINGS Alignment: Normal. Skull base and vertebrae: Multilevel severe degenerative changes of the spine with associated multilevel posterior disc osteophyte complex formations. Associated severe osseous neural foraminal stenosis at the bilateral C3-C4 and C4-C5 levels. No severe osseous central canal stenosis. No acute fracture. No aggressive appearing focal osseous lesion or focal pathologic process. Soft tissues and spinal canal: No prevertebral fluid or swelling. No visible canal hematoma. Upper chest: Unremarkable. Other: None. IMPRESSION: 1. No acute intracranial abnormality. 2. No acute displaced fracture or traumatic listhesis of the cervical spine. 3. Multilevel severe degenerative changes of the spine with associated multilevel posterior disc osteophyte complex formations. Associated severe osseous neural foraminal stenosis at the bilateral C3-C4 and C4-C5 levels. Electronically Signed   By: Tish Frederickson M.D.   On: 09/16/2022 18:25   CT Lumbar Spine Wo Contrast  Result  Date: 09/16/2022 CLINICAL DATA:  Back trauma, no prior imaging (Age >= 16y) EXAM: CT LUMBAR SPINE WITHOUT CONTRAST TECHNIQUE: Multidetector CT imaging of the lumbar spine was performed without intravenous contrast administration. Multiplanar CT image reconstructions were also generated. RADIATION DOSE REDUCTION: This exam was performed according to the departmental dose-optimization program which includes automated exposure control, adjustment of the mA and/or kV according to patient size and/or use of iterative reconstruction technique. COMPARISON:  MRI lumbar spine 04/29/2021 FINDINGS: Segmentation: 5 lumbar type vertebrae. Alignment: Levoscoliosis centered at the L3 level. Vertebrae: Multilevel severe degenerative changes of the spine. Associated multiple level posterior disc osteophyte complex formation. Associated left L4-L5 as well as T11-T12 and T10-T11 severe osseous neural foraminal stenosis. Suggestion of at least moderate to severe osseous neural foraminal stenosis at the L1-L2 and T10-T11. Paraspinal and other soft tissues: Negative. Disc levels: Multilevel intervertebral disc space vacuum phenomenon. Other. Atherosclerotic plaque. Infrarenal abdominal aorta enlarged measuring up to 3.2 x 2.8 cm. IMPRESSION: 1. No acute displaced fracture or traumatic listhesis of the lumbar spine. 2. Multilevel posterior disc osteophyte complex formation with associated left L4-L5 as well as T11-T12 and T10-T11 severe osseous neural foraminal stenosis. Suggestion of at least moderate to severe osseous neural foraminal stenosis at the L1-L2 and T10-T11. 3.  Aortic Atherosclerosis (ICD10-I70.0). 4. 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 Recommend follow-up ultrasound every 3 years. This recommendation follows ACR consensus guidelines: White Paper of the ACR Incidental Findings Committee II on Vascular Findings. J Am Coll Radiol 2013; 10:789-794. Electronically Signed   By: Tish Frederickson M.D.   On: 09/16/2022  18:21   DG Chest Portable 1 View  Result Date: 09/16/2022 CLINICAL DATA:  Short of breath, fell EXAM: PORTABLE CHEST 1 VIEW COMPARISON:  09/22/2019 FINDINGS: Single frontal view of the chest demonstrates an unremarkable cardiac silhouette. No acute airspace disease, effusion, or pneumothorax. No acute displaced fracture. IMPRESSION: 1. No acute intrathoracic process. Electronically Signed   By: Sharlet Salina M.D.   On: 09/16/2022 18:06    Cardiac Studies   Echo 09/18/22 1. Left ventricular ejection fraction, by estimation, is 55 to 60%. The  left ventricle has normal function. Left ventricular endocardial border  not optimally defined to evaluate regional wall motion. There is moderate  left ventricular hypertrophy. Left  ventricular diastolic parameters are indeterminate.   2. Right ventricular systolic function is low normal. The right  ventricular size is normal. Mildly increased right ventricular wall  thickness. Tricuspid regurgitation signal is inadequate for assessing PA  pressure.   3. The mitral valve is normal in structure. Trivial mitral valve  regurgitation.  4. The aortic valve has an indeterminant number of cusps. There is mild  calcification of the aortic valve. There is mild thickening of the aortic  valve. Aortic valve regurgitation is not visualized. No aortic stenosis is  present.   Patient Profile     77 y.o. female with a hx of anxiety, depression, hypertension, dyslipidemia, hypothyroidism, overactive bladder, OSA, OA who is being seen 09/17/2022 for the evaluation of palpitations   Assessment & Plan    A-flutter with RVR - presented with dizziness, weakness, fatigue and a fall (no LOC) found to have rapid aflutter 125bpm started on IV dilt - BP low and she was started on IV amiodarone - Patient converted overnight to NSR - Continue Toprol 100mg  daily - K low 2.9 on admission. Goal>4. keep Mag>2 - she reports compliance with CPAP - Echo showed normal pump  function, moderate LVH, trivial MR - she remains in aflutter with rates in the 130s - she is overall comfortable - CHADSVASC at least 5 (agex2, sex, PAD, HTN). continue Eliquis 5mg  BID   Elevated BNP - BNP 454 - Echo with normal LVEF, suspect diastolic dysfunction - started on IV lasix 40mg  daily - PTA hydrochlorothiazide held - LLE improving   Hypokalemia - K 2.9 given IV potassium - today 3.6 - goal>4   Hypothyroidism - synthroid per IM   Dyslipidemia - LDL 109 - pravastatin 40mg  daily   Hypertension - Losartan 50mg  daily, hydrochlorothiazide 12.5mg  daily, - dilt 15 Q8H - Toprol 100mg  daily - stop hydrochlorothiazide with lasix  For questions or updates, please contact [facility] Please consult [email or web address] for contact info under        Signed, Keeleigh Terris David Stall, PA-C  09/18/2022, 12:57 PM

## 2022-09-18 NOTE — Evaluation (Signed)
Physical Therapy Evaluation Patient Details Name: Robin Espinoza MRN: [id number] DOB: March 13, 1946 Today's Date: 09/18/2022  History of Present Illness  Robin Espinoza is a 77 y.o. Caucasian female with medical history significant for anxiety, depression, hypertension, dyslipidemia, hypothyroidism, overactive bladder, obstructive sleep apnea and osteoarthritis, who presented to the emergency room with acute onset of fall while using her rollator today. She hit the right side of her face with subsequent forehead laceration as well as had a right hand skin tear. Imaging negative for acute fractures.  MD assessment includes: atrial fibrillation with rapid ventricular response, elevated BNP, hypokalemia, and vitamin B12 deficiency.   Clinical Impression  Pt was pleasant and motivated to participate during the session and put forth good effort throughout. Pt required significant physical assistance with bed mobility tasks and transfers and presented with mod posterior lean upon coming to initial stand requiring physical assist to correct and to prevent LOB.  Once ambulating pt's stability improved and she was ultimately able to amb 12 feet with very slow but steady cadence with no overt LOB.  Pt reported no adverse symptoms during the session.          Recommendations for follow up therapy are one component of a multi-disciplinary discharge planning process, led by the attending physician.  Recommendations may be updated based on patient status, additional functional criteria and insurance authorization.  Follow Up Recommendations Can patient physically be transported by private vehicle: No     Assistance Recommended at Discharge Frequent or constant Supervision/Assistance  Patient can return home with the following  A lot of help with walking and/or transfers;A little help with bathing/dressing/bathroom;Assistance with cooking/housework;Direct supervision/assist for medications management;Assist for  transportation    Equipment Recommendations None recommended by PT  Recommendations for Other Services       Functional Status Assessment Patient has had a recent decline in their functional status and demonstrates the ability to make significant improvements in function in a reasonable and predictable amount of time.     Precautions / Restrictions Precautions Precautions: Fall Restrictions Weight Bearing Restrictions: No      Mobility  Bed Mobility Overal bed mobility: Needs Assistance       Supine to sit: Mod assist, +2 for physical assistance     General bed mobility comments: +2 Mod A for BLE and trunk control during sit to sup    Transfers Overall transfer level: Needs assistance Equipment used: Rolling walker (2 wheels) Transfers: Sit to/from Stand Sit to Stand: Min assist, +2 physical assistance           General transfer comment: Mod verbal and tactile cues for increased trunk flexion and foot positioning, +2 min A to come to standing as well as to prevent posterior LOB upon initial stand    Ambulation/Gait Ambulation/Gait assistance: Min guard Gait Distance (Feet): 12 Feet x 1, 6 Feet x 1  Assistive device: Rolling walker (2 wheels) Gait Pattern/deviations: Step-through pattern, Decreased step length - right, Decreased step length - left, Trunk flexed Gait velocity: decreased     General Gait Details: Very slow cadence with short B step length but steady with no overt LOB  Stairs            Wheelchair Mobility    Modified Rankin (Stroke Patients Only)       Balance Overall balance assessment: Needs assistance Sitting-balance support: No upper extremity supported, Feet supported Sitting balance-Leahy Scale: Good     Standing balance support: Bilateral upper extremity supported, Reliant  on assistive device for balance, During functional activity Standing balance-Leahy Scale: Poor Standing balance comment: Posterior instability upon  initial stand                             Pertinent Vitals/Pain Pain Assessment Pain Assessment: No/denies pain    Home Living Family/patient expects to be discharged to:: Private residence Living Arrangements: Spouse/significant other Available Help at Discharge: Family Type of Home: House Home Access: Level entry       Home Layout: One level Home Equipment: Agricultural consultant (2 wheels);Rollator (4 wheels);Shower seat;BSC/3in1;Toilet riser;Hospital bed Additional Comments: Owns lift chair    Prior Function Prior Level of Function : History of Falls (last six months);Independent/Modified Independent             Mobility Comments: Mod Ind amb limited community distances with a rollator, 2 falls in last 6 months ADLs Comments: incontinent, wears pull ups, takes sponge baths     Hand Dominance   Dominant Hand: Right    Extremity/Trunk Assessment   Upper Extremity Assessment Upper Extremity Assessment: Generalized weakness    Lower Extremity Assessment Lower Extremity Assessment: Generalized weakness       Communication   Communication: No difficulties  Cognition Arousal/Alertness: Awake/alert Behavior During Therapy: WFL for tasks assessed/performed Overall Cognitive Status: Within Functional Limits for tasks assessed                                          General Comments      Exercises     Assessment/Plan    PT Assessment Patient needs continued PT services  PT Problem List Decreased strength;Decreased activity tolerance;Decreased balance;Decreased mobility;Decreased knowledge of use of DME       PT Treatment Interventions DME instruction;Gait training;Functional mobility training;Therapeutic activities;Therapeutic exercise;Balance training;Patient/family education    PT Goals (Current goals can be found in the Care Plan section)  Acute Rehab PT Goals Patient Stated Goal: To get stronger with improved balance PT Goal  Formulation: With patient Time For Goal Achievement: 10/01/22 Potential to Achieve Goals: Good    Frequency Min 4X/week     Co-evaluation               AM-PAC PT "6 Clicks" Mobility  Outcome Measure Help needed turning from your back to your side while in a flat bed without using bedrails?: A Lot Help needed moving from lying on your back to sitting on the side of a flat bed without using bedrails?: A Lot Help needed moving to and from a bed to a chair (including a wheelchair)?: A Lot Help needed standing up from a chair using your arms (e.g., wheelchair or bedside chair)?: A Lot Help needed to walk in hospital room?: A Lot Help needed climbing 3-5 steps with a railing? : Total 6 Click Score: 11    End of Session Equipment Utilized During Treatment: Gait belt Activity Tolerance: Patient tolerated treatment well Patient left: in bed;with call bell/phone within reach;with bed alarm set Nurse Communication: Mobility status PT Visit Diagnosis: Unsteadiness on feet (R26.81);History of falling (Z91.81);Difficulty in walking, not elsewhere classified (R26.2);Muscle weakness (generalized) (M62.81)    Time: 1610-9604 PT Time Calculation (min) (ACUTE ONLY): 41 min   Charges:   PT Evaluation $PT Eval Moderate Complexity: 1 Mod PT Treatments $Gait Training: 8-22 mins       D.  Elly Modena PT, DPT 09/18/22, 5:04 PM

## 2022-09-18 NOTE — Progress Notes (Signed)
*  PRELIMINARY RESULTS* Echocardiogram 2D Echocardiogram has been performed.  Robin Espinoza 09/18/2022, 10:56 AM

## 2022-09-19 DIAGNOSIS — R600 Localized edema: Secondary | ICD-10-CM | POA: Diagnosis not present

## 2022-09-19 DIAGNOSIS — I10 Essential (primary) hypertension: Secondary | ICD-10-CM | POA: Diagnosis not present

## 2022-09-19 DIAGNOSIS — I4892 Unspecified atrial flutter: Secondary | ICD-10-CM

## 2022-09-19 DIAGNOSIS — I4891 Unspecified atrial fibrillation: Secondary | ICD-10-CM | POA: Diagnosis not present

## 2022-09-19 LAB — CBC
HCT: 41.2 % (ref 36.0–46.0)
Hemoglobin: 14 g/dL (ref 12.0–15.0)
MCH: 33.9 pg (ref 26.0–34.0)
MCHC: 34 g/dL (ref 30.0–36.0)
MCV: 99.8 fL (ref 80.0–100.0)
Platelets: 161 10*3/uL (ref 150–400)
RBC: 4.13 MIL/uL (ref 3.87–5.11)
RDW: 13.5 % (ref 11.5–15.5)
WBC: 8.1 10*3/uL (ref 4.0–10.5)
nRBC: 0 % (ref 0.0–0.2)

## 2022-09-19 LAB — T4, FREE: Free T4: 0.99 ng/dL (ref 0.61–1.12)

## 2022-09-19 LAB — BASIC METABOLIC PANEL
Anion gap: 6 (ref 5–15)
BUN: 23 mg/dL (ref 8–23)
CO2: 29 mmol/L (ref 22–32)
Calcium: 8.5 mg/dL — ABNORMAL LOW (ref 8.9–10.3)
Chloride: 102 mmol/L (ref 98–111)
Creatinine, Ser: 0.98 mg/dL (ref 0.44–1.00)
GFR, Estimated: 59 mL/min — ABNORMAL LOW (ref >60)
Glucose, Bld: 96 mg/dL (ref 70–99)
Potassium: 3.8 mmol/L (ref 3.5–5.1)
Sodium: 137 mmol/L (ref 135–145)

## 2022-09-19 MED ORDER — LEVOTHYROXINE SODIUM 88 MCG PO TABS: 88.0000 ug | ORAL_TABLET | Freq: Every day | ORAL | Status: DC

## 2022-09-19 MED ORDER — FUROSEMIDE 40 MG PO TABS
40.0000 mg | ORAL_TABLET | Freq: Every day | ORAL | Status: DC
  Administered 2022-09-20 – 2022-09-23 (×4): 40 mg via ORAL
  Filled 2022-09-19 (×4): qty 1

## 2022-09-19 MED ORDER — POTASSIUM CHLORIDE CRYS ER 20 MEQ PO TBCR
40.0000 meq | EXTENDED_RELEASE_TABLET | Freq: Once | ORAL | Status: AC
  Administered 2022-09-19: 40 meq via ORAL
  Filled 2022-09-19: qty 2

## 2022-09-19 NOTE — Progress Notes (Signed)
Rounding Note    Patient Name: Robin Espinoza Date of Encounter: 09/19/2022  [facility] Cardiologist: New  Subjective   Patient is asleep during interview. She remains in SB.   Inpatient Medications    Scheduled Meds:  amiodarone  400 mg Oral BID   apixaban  5 mg Oral BID   buPROPion  300 mg Oral Daily   vitamin D3  1,000 Units Oral Daily   cyanocobalamin  1,000 mcg Intramuscular Daily   Followed by   Melene Muller ON 09/25/2022] vitamin B-12  1,000 mcg Oral Daily   furosemide  40 mg Intravenous Daily   gabapentin  200 mg Oral BID   [START ON 09/21/2022] levothyroxine  88 mcg Oral Q0600   magnesium oxide  400 mg Oral BID   metoprolol succinate  100 mg Oral Daily   mirabegron ER  25 mg Oral Daily   pantoprazole  40 mg Oral Daily   pravastatin  40 mg Oral q1800   traZODone  100 mg Oral QHS   Continuous Infusions:  PRN Meds: acetaminophen **OR** acetaminophen, magnesium hydroxide, ondansetron **OR** ondansetron (ZOFRAN) IV, polyvinyl alcohol, senna, traMADol   Vital Signs    Vitals:   09/18/22 1935 09/18/22 2319 09/19/22 0317 09/19/22 0600  BP: 131/78 116/74 115/73   Pulse: 62 (!) 59 (!) 55   Resp: 18 18 16    Temp: 97.8 F (36.6 C) (!) 97.3 F (36.3 C) (!) 97.5 F (36.4 C)   TempSrc: Oral     SpO2: 100% 97% 97%   Weight:    100.7 kg  Height:        Intake/Output Summary (Last 24 hours) at 09/19/2022 0723 Last data filed at 09/18/2022 1940 Gross per 24 hour  Intake 0 ml  Output 500 ml  Net -500 ml      09/19/2022    6:00 AM 09/18/2022    4:18 AM 09/17/2022    6:06 AM  Last 3 Weights  Weight (lbs) 222 lb 0.1 oz 199 lb 15.3 oz 219 lb 5.7 oz  Weight (kg) 100.7 kg 90.7 kg 99.5 kg      Telemetry    SB HR 50s - Personally Reviewed  ECG    NO new - Personally Reviewed  Physical Exam   GEN: No acute distress.   Neck: No JVD Cardiac: bradycardia, RR, no murmurs, rubs, or gallops.  Respiratory: Clear to auscultation bilaterally. GI: Soft,  nontender, non-distended  MS: No edema; No deformity. Neuro:  Nonfocal  Psych: Normal affect   Labs    High Sensitivity Troponin:   Recent Labs  Lab 09/17/22 0844 09/17/22 1120  TROPONINIHS 58* 48*     Chemistry Recent Labs  Lab 09/16/22 1849 09/17/22 0504 09/18/22 0115  NA 138 138 137  K 2.9* 3.4* 3.6  CL 107 103 101  CO2 24 27 26   GLUCOSE 72 128* 109*  BUN 27* 21 20  CREATININE 0.77 0.74 1.00  CALCIUM 7.1* 8.0* 8.4*  MG 1.9  --  1.8  PROT 5.4*  --   --   ALBUMIN 3.0*  --   --   AST 17  --   --   ALT 14  --   --   ALKPHOS 72  --   --   BILITOT 0.5  --   --   GFRNONAA >60 >60 58*  ANIONGAP 7 8 10     Lipids No results for input(s): "CHOL", "TRIG", "HDL", "LABVLDL", "LDLCALC", "CHOLHDL" in the last 168  hours.  Hematology Recent Labs  Lab 09/17/22 0504 09/18/22 0115 09/19/22 0218  WBC 9.9 8.3 8.1  RBC 4.40 4.24 4.13  HGB 14.5 14.2 14.0  HCT 44.6 42.1 41.2  MCV 101.4* 99.3 99.8  MCH 33.0 33.5 33.9  MCHC 32.5 33.7 34.0  RDW 13.6 13.5 13.5  PLT 165 174 161   Thyroid  Recent Labs  Lab 09/16/22 1644 09/16/22 1849 09/19/22 0622  TSH 4.765*  --   --   FREET4  --    < > 0.99   < > = values in this interval not displayed.    BNP Recent Labs  Lab 09/16/22 1644  BNP 454.5*    DDimer No results for input(s): "DDIMER" in the last 168 hours.   Radiology    US RENAL  Result Date: 09/18/2022 CLINICAL DATA:  Incontinence EXAM: RENAL / URINARY TRACT ULTRASOUND COMPLETE COMPARISON:  None Available. FINDINGS: Right Kidney: Renal measurements: 11.0 x 5.3 x 5.0 = volume: 153.8 mL. Echogenicity within normal limits. No mass or hydronephrosis visualized. Left Kidney: Renal measurements: 11.1 x 5.9 x 5.1 cm = volume: 174 mL. Echogenicity within normal limits. No mass or hydronephrosis visualized. Bladder: Appears normal for degree of bladder distention. Ureteral jets were seen. Other: None. IMPRESSION: No collecting system dilatation. Electronically Signed   By: Lavinia Kays M.D.   On: 09/18/2022 14:50   ECHOCARDIOGRAM COMPLETE  Result Date: 09/18/2022    ECHOCARDIOGRAM REPORT   Patient Name:   Robin Espinoza [place] Date of Exam: 09/18/2022 Medical Rec #:  [id number]     Height:       65.0 in Accession #:    [id number]    Weight:       200.0 lb Date of Birth:  23-Jan-1946    BSA:          1.978 m Patient Age:    76 years      BP:           129/82 mmHg Patient Gender: F             HR:           62 bpm. Exam Location:  [facility] Procedure: 2D Echo, Cardiac Doppler and Color Doppler Indications:     Atrial Fibrillation I48.91  History:         Patient has no prior history of Echocardiogram examinations.                  Risk Factors:Hypertension and Dyslipidemia.  Sonographer:     Cristela Blue Referring Phys:  [id number] JAN A MANSY Diagnosing Phys: Yvonne Kendall MD  Sonographer Comments: Suboptimal apical window. IMPRESSIONS  1. Left ventricular ejection fraction, by estimation, is 55 to 60%. The left ventricle has normal function. Left ventricular endocardial border not optimally defined to evaluate regional wall motion. There is moderate left ventricular hypertrophy. Left ventricular diastolic parameters are indeterminate.  2. Right ventricular systolic function is low normal. The right ventricular size is normal. Mildly increased right ventricular wall thickness. Tricuspid regurgitation signal is inadequate for assessing PA pressure.  3. The mitral valve is normal in structure. Trivial mitral valve regurgitation.  4. The aortic valve has an indeterminant number of cusps. There is mild calcification of the aortic valve. There is mild thickening of the aortic valve. Aortic valve regurgitation is not visualized. No aortic stenosis is present. FINDINGS  Left Ventricle: Left ventricular ejection fraction, by estimation, is 55 to 60%. The left ventricle has normal function.  Left ventricular endocardial border not optimally defined to evaluate regional wall motion. The left ventricular internal  cavity size was normal in size. There is moderate left ventricular hypertrophy. Left ventricular diastolic parameters are indeterminate. Right Ventricle: The right ventricular size is normal. Mildly increased right ventricular wall thickness. Right ventricular systolic function is low normal. Tricuspid regurgitation signal is inadequate for assessing PA pressure. Left Atrium: Left atrial size was normal in size. Right Atrium: Right atrial size was normal in size. Pericardium: The pericardium was not well visualized. Mitral Valve: The mitral valve is normal in structure. Trivial mitral valve regurgitation. Tricuspid Valve: The tricuspid valve is not well visualized. Tricuspid valve regurgitation is trivial. Aortic Valve: The aortic valve has an indeterminant number of cusps. There is mild calcification of the aortic valve. There is mild thickening of the aortic valve. Aortic valve regurgitation is not visualized. No aortic stenosis is present. Aortic valve mean gradient measures 3.5 mmHg. Aortic valve peak gradient measures 5.7 mmHg. Aortic valve area, by VTI measures 1.95 cm. Pulmonic Valve: The pulmonic valve was not well visualized. Pulmonic valve regurgitation is not visualized. No evidence of pulmonic stenosis. Aorta: The aortic root is normal in size and structure. Pulmonary Artery: The pulmonary artery is not well seen. Venous: The inferior vena cava was not well visualized. IAS/Shunts: The interatrial septum was not well visualized.  LEFT VENTRICLE PLAX 2D LVIDd:         4.00 cm   Diastology LVIDs:         2.80 cm   LV e' medial:    6.42 cm/s LV PW:         1.40 cm   LV E/e' medial:  12.2 LV IVS:        1.50 cm   LV e' lateral:   8.49 cm/s LVOT diam:     2.00 cm   LV E/e' lateral: 9.2 LV SV:         44 LV SV Index:   22 LVOT Area:     3.14 cm  RIGHT VENTRICLE RV S prime:     14.70 cm/s TAPSE (M-mode): 1.6 cm LEFT ATRIUM             Index        RIGHT ATRIUM           Index LA diam:        3.80 cm 1.92 cm/m    RA Area:     14.30 cm LA Vol (A2C):   49.3 ml 24.92 ml/m  RA Volume:   30.60 ml  15.47 ml/m LA Vol (A4C):   41.5 ml 20.98 ml/m LA Biplane Vol: 45.5 ml 23.00 ml/m  AORTIC VALVE AV Area (Vmax):    1.62 cm AV Area (Vmean):   1.64 cm AV Area (VTI):     1.95 cm AV Vmax:           119.00 cm/s AV Vmean:          84.800 cm/s AV VTI:            0.224 m AV Peak Grad:      5.7 mmHg AV Mean Grad:      3.5 mmHg LVOT Vmax:         61.50 cm/s LVOT Vmean:        44.200 cm/s LVOT VTI:          0.139 m LVOT/AV VTI ratio: 0.62  AORTA Ao Root diam: 3.00 cm MITRAL VALVE MV  Area (PHT): 3.99 cm    SHUNTS MV Decel Time: 190 msec    Systemic VTI:  0.14 m MV E velocity: 78.10 cm/s  Systemic Diam: 2.00 cm MV A velocity: 34.60 cm/s MV E/A ratio:  2.26 Yvonne Kendall MD Electronically signed by Yvonne Kendall MD Signature Date/Time: 09/18/2022/1:41:14 PM    Final     Cardiac Studies   Echo 09/18/22 1. Left ventricular ejection fraction, by estimation, is 55 to 60%. The  left ventricle has normal function. Left ventricular endocardial border  not optimally defined to evaluate regional wall motion. There is moderate  left ventricular hypertrophy. Left  ventricular diastolic parameters are indeterminate.   2. Right ventricular systolic function is low normal. The right  ventricular size is normal. Mildly increased right ventricular wall  thickness. Tricuspid regurgitation signal is inadequate for assessing PA  pressure.   3. The mitral valve is normal in structure. Trivial mitral valve  regurgitation.   4. The aortic valve has an indeterminant number of cusps. There is mild  calcification of the aortic valve. There is mild thickening of the aortic  valve. Aortic valve regurgitation is not visualized. No aortic stenosis is  present.   Patient Profile     77 y.o. female with a hx of anxiety, depression, hypertension, dyslipidemia, hypothyroidism, overactive bladder, OSA, OA who is being seen 09/17/2022 for the  evaluation of palpitations   Assessment & Plan    A-flutter with RVR - presented with dizziness, weakness, fatigue and a fall (no LOC) found to have rapid aflutter 125bpm started on IV amio with conversion to NSR - continue amiodarone 400mg  BID x 7 days, amiodarone 200mg  BID x 7 days, amiodarone 200mg  daily thereafter - Continue Toprol 100mg  daily - K low 2.9 on admission. Goal>4. keep Mag>2 - she reports compliance with CPAP - Echo showed normal pump function, moderate LVH, trivial MR - CHADSVASC at least 5 (agex2, sex, PAD, HTN). continue Eliquis 5mg  BID. Caution with fall risk. - HR in the 50s, may need to decrease Toprol   Elevated BNP - BNP 454 - Echo with normal LVEF, suspect diastolic dysfunction - started on IV lasix 40mg  daily - I/Os not accurate - LLE improving   Hypokalemia - K 2.9 s/p potassium - today 3.6 - goal>4   Hypothyroidism - synthroid per IM   Dyslipidemia - LDL 109 - pravastatin 40mg  daily   Hypertension - Toprol 100mg  daily  For questions or updates, please contact [facility] Please consult [email or web address] for contact info under        Signed, Anisha Starliper David Stall, PA-C  09/19/2022, 7:23 AM

## 2022-09-19 NOTE — Plan of Care (Signed)

## 2022-09-19 NOTE — Progress Notes (Signed)
[facility] Progress Note  Patient: Robin Espinoza    ZOX:[id number]  DOA: 09/16/2022     Date of Service: the patient was seen and examined on 09/19/2022  Chief Complaint  Patient presents with   Fall   Palpitations   Brief hospital course: Robin Espinoza is a 77 y.o. Caucasian female with medical history significant for anxiety, depression, hypertension, dyslipidemia, hypothyroidism, overactive bladder, obstructive sleep apnea and osteoarthritis, who presented to the emergency room with acute onset of fall while using her rollator today.  She stated that she felt lightheaded and has been feeling fatigued lately.  She hit the right side of her face with subsequent forehead laceration as well as had a right hand skin tear.  She denies any loss of consciousness.    ED Course: When she came to the ER BP was 116/91 with a heart rate of 143 and  respiratory rate of 20, temperature 98.1 and pulse currently 96% on room air.  Labs revealed hypokalemia of 2.9, BUN of 27 and albumin 3 with total protein of 5.4.  BNP was 454.5.  CBC showed hemoconcentration and macrocytosis.  TSH was 4.76 and free T4 1.22 both minimally elevated. EKG as reviewed by me : EKG showed atrial fibrillation with rapid ventricular response of 125 with low voltage QRS. Imaging: Portable chest x-ray showed no acute cardiopulmonary disease. Noncontrasted head CT scan showed no acute intracranial abnormalities. C-spine CT showed no acute findings.  There were multilevel severe degenerative changes of the spine with associated multilevel posterior disc osteophyte complex formations and associated osseous neuroforaminal stenosis at both C3-C4 and C4-C5 levels. Lumbar spine CT without contrast revealed the following: 1. No acute displaced fracture or traumatic listhesis of the lumbar spine. 2. Multilevel posterior disc osteophyte complex formation with associated left L4-L5 as well as T11-T12 and T10-T11 severe osseous neural  foraminal stenosis. Suggestion of at least moderate to severe osseous neural foraminal stenosis at the L1-L2 and T10-T11. 3.  Aortic Atherosclerosis (ICD10-I70.0). 4. 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 Recommend follow-up ultrasound every 3 years. This recommendation follows ACR consensus guidelines.   Patient received IV diltiazem bolus followed by drip.  Cardiology was consulted.  Assessment and Plan:   Atrial fibrillation with rapid ventricular response, now SR brady S/p diltiazem infusion.  Cardiology switched to p.o. diltiazem and Toprol, also added IV amiodarone for better rate control.  Switch to oral amiodarone 400 mg p.o. BID x 7day, 200 mg po BID x day and the 200 mg pod TTE LVEF 55 to 60%, moderate LV hypertrophy.  -s/p Heparin infusion, transition to Eliquis 5 mg p.o. BID on 6/28 -Cardiology following.     Elevated brain natriuretic peptide (BNP) level No prior history of heart failure.  TTE as above -Cardiology started her on IV Lasix, switch to oral Lasix 40 mg p.o. daily from tomorrow   Hypokalemia, Resolved  K 3.6--3.8 now -Replete potassium and monitor   Hypothyroidism TSH 4.76, free T41.22 Both elevated Patient is on Synthroid 88 mcg Due to high free T4 level I held Synthroid for now We will continue monitor free T4 level and patient was recommended to follow with endocrinologist as an outpatient. FT4 level 0.99 wnl, improved F/u Free T4 level in am again   GERD without esophagitis - We will continue PPI therapy.   Dyslipidemia - We will continue statin therapy.   Essential hypertension - Home losartan and HCTZ was discontinued by cardiology to make a room for rate controlling medications.  Currently on diltiazem, metoprolol and Lasix   Depression, recurrent (HCC) - We will continue her Wellbutrin XL, Effexor XR and Cymbalta.   Vitamin B12 deficiency: B 12 level 248 goal >400, started vitamin B12 1000 mcg IM injection daily during hospital  stay, followed by oral supplement.  Follow-up PCP to repeat vitamin B12 level after 3 to 6 months.  Body mass index is 36.94 kg/m.  Interventions:  Diet: Heart healthy DVT Prophylaxis: Therapeutic Anticoagulation with Eliquis    Advance goals of care discussion: DNR  Family Communication: family was present at bedside, at the time of interview.  The pt provided permission to discuss medical plan with the family. Opportunity was given to ask question and all questions were answered satisfactorily.   Disposition:  Pt is from Home, admitted with A-fib with RVR, s/p hep and Amio gtt, now sinus brady, which precludes a safe discharge. Discharge to home, when cleared by cardiology.  Subjective: No significant events overnight, patient stated that she feels a lot better now, has more energy, less tiredness.  Denies any chest pain or palpitation, no shortness of breath.  Patient has not walked yet in the hallway.  We will try to ambulate her today and plan for disposition tomorrow a.m. if remains stable   Physical Exam: General: NAD, lying comfortably Appear in no distress, affect appropriate Eyes: PERRLA ENT: Oral Mucosa Clear, moist  Neck: no JVD,  Cardiovascular: Sinus bradycardia, S1-S2 audible, no Murmur,  Respiratory: good respiratory effort, Bilateral Air entry equal and Decreased, no Crackles, no wheezes Abdomen: Bowel Sound present, Soft and no tenderness,  Skin: no rashes Extremities: no Pedal edema, no calf tenderness Neurologic: without any new focal findings Gait not checked due to patient safety concerns  Vitals:   09/19/22 0317 09/19/22 0600 09/19/22 0907 09/19/22 1245  BP: 115/73  133/88 114/77  Pulse: (!) 55  60 60  Resp: 16  20 20   Temp: (!) 97.5 F (36.4 C)  98.3 F (36.8 C) 98.1 F (36.7 C)  TempSrc:      SpO2: 97%  91% 100%  Weight:  100.7 kg    Height:        Intake/Output Summary (Last 24 hours) at 09/19/2022 1259 Last data filed at 09/19/2022  1200 Gross per 24 hour  Intake --  Output 2100 ml  Net -2100 ml   Filed Weights   09/17/22 0606 09/18/22 0418 09/19/22 0600  Weight: 99.5 kg 90.7 kg 100.7 kg    Data Reviewed: I have personally reviewed and interpreted daily labs, tele strips, imagings as discussed above. I reviewed all nursing notes, pharmacy notes, vitals, pertinent old records I have discussed plan of care as described above with RN and patient/family.  CBC: Recent Labs  Lab 09/16/22 1644 09/17/22 0504 09/18/22 0115 09/19/22 0218  WBC 9.8 9.9 8.3 8.1  NEUTROABS 7.0  --   --   --   HGB 15.3* 14.5 14.2 14.0  HCT 47.2* 44.6 42.1 41.2  MCV 100.9* 101.4* 99.3 99.8  PLT 208 165 174 161   Basic Metabolic Panel: Recent Labs  Lab 09/16/22 1849 09/17/22 0504 09/18/22 0115 09/19/22 0622  NA 138 138 137 137  K 2.9* 3.4* 3.6 3.8  CL 107 103 101 102  CO2 24 27 26 29   GLUCOSE 72 128* 109* 96  BUN 27* 21 20 23   CREATININE 0.77 0.74 1.00 0.98  CALCIUM 7.1* 8.0* 8.4* 8.5*  MG 1.9  --  1.8  --  Studies: US RENAL  Result Date: 09/18/2022 CLINICAL DATA:  Incontinence EXAM: RENAL / URINARY TRACT ULTRASOUND COMPLETE COMPARISON:  None Available. FINDINGS: Right Kidney: Renal measurements: 11.0 x 5.3 x 5.0 = volume: 153.8 mL. Echogenicity within normal limits. No mass or hydronephrosis visualized. Left Kidney: Renal measurements: 11.1 x 5.9 x 5.1 cm = volume: 174 mL. Echogenicity within normal limits. No mass or hydronephrosis visualized. Bladder: Appears normal for degree of bladder distention. Ureteral jets were seen. Other: None. IMPRESSION: No collecting system dilatation. Electronically Signed   By: Coren Kays M.D.   On: 09/18/2022 14:50    Scheduled Meds:  amiodarone  400 mg Oral BID   apixaban  5 mg Oral BID   buPROPion  300 mg Oral Daily   vitamin D3  1,000 Units Oral Daily   cyanocobalamin  1,000 mcg Intramuscular Daily   Followed by   Melene Muller ON 09/25/2022] vitamin B-12  1,000 mcg Oral Daily   [START  ON 09/20/2022] furosemide  40 mg Oral Daily   gabapentin  200 mg Oral BID   [START ON 09/28/2022] levothyroxine  88 mcg Oral Q0600   magnesium oxide  400 mg Oral BID   metoprolol succinate  100 mg Oral Daily   mirabegron ER  25 mg Oral Daily   pantoprazole  40 mg Oral Daily   pravastatin  40 mg Oral q1800   traZODone  100 mg Oral QHS   Continuous Infusions:   PRN Meds: acetaminophen **OR** acetaminophen, magnesium hydroxide, ondansetron **OR** ondansetron (ZOFRAN) IV, polyvinyl alcohol, senna, traMADol  Time spent: 50 minutes  Author: Gillis Santa. MD [facility] 09/19/2022 12:59 PM  To reach On-call, see care teams to locate the attending and reach out to them via [email or web address]. If 7PM-7AM, please contact night-coverage If you still have difficulty reaching the attending provider, please page the [facility] (Director on Call) for [facility] on amion for assistance.

## 2022-09-20 DIAGNOSIS — I4891 Unspecified atrial fibrillation: Secondary | ICD-10-CM | POA: Diagnosis not present

## 2022-09-20 DIAGNOSIS — R600 Localized edema: Secondary | ICD-10-CM | POA: Diagnosis not present

## 2022-09-20 LAB — T4, FREE: Free T4: 0.95 ng/dL (ref 0.61–1.12)

## 2022-09-20 LAB — BASIC METABOLIC PANEL
Anion gap: 6 (ref 5–15)
BUN: 24 mg/dL — ABNORMAL HIGH (ref 8–23)
CO2: 29 mmol/L (ref 22–32)
Calcium: 8.7 mg/dL — ABNORMAL LOW (ref 8.9–10.3)
Chloride: 102 mmol/L (ref 98–111)
Creatinine, Ser: 0.91 mg/dL (ref 0.44–1.00)
GFR, Estimated: >60 mL/min (ref >60)
Glucose, Bld: 98 mg/dL (ref 70–99)
Potassium: 4.1 mmol/L (ref 3.5–5.1)
Sodium: 137 mmol/L (ref 135–145)

## 2022-09-20 LAB — CBC
HCT: 40.9 % (ref 36.0–46.0)
Hemoglobin: 13.8 g/dL (ref 12.0–15.0)
MCH: 33.5 pg (ref 26.0–34.0)
MCHC: 33.7 g/dL (ref 30.0–36.0)
MCV: 99.3 fL (ref 80.0–100.0)
Platelets: 176 10*3/uL (ref 150–400)
RBC: 4.12 MIL/uL (ref 3.87–5.11)
RDW: 13.4 % (ref 11.5–15.5)
WBC: 8.5 10*3/uL (ref 4.0–10.5)
nRBC: 0 % (ref 0.0–0.2)

## 2022-09-20 LAB — PHOSPHORUS: Phosphorus: 4 mg/dL (ref 2.5–4.6)

## 2022-09-20 LAB — MAGNESIUM: Magnesium: 2 mg/dL (ref 1.7–2.4)

## 2022-09-20 NOTE — TOC Initial Note (Signed)
Transition of Care [facility]) - Initial/Assessment Note    Patient Details  Name: Robin Espinoza MRN: [id number] Date of Birth: 1945/08/10  Transition of [facility]) CM/SW Contact:    Susa Simmonds, LCSWA Phone Number: 09/20/2022, 12:38 PM  Clinical Narrative:  CSW spoke with patient who stated she prefers to go home but will take the opportunity and participate in SNF rehab. Patient stated she has been to [facility] before and would like to go back. Patient understands she might not be able to go to [facility] based on bed availability and insurance. Patient voiced understanding.    Expected Discharge Plan: Skilled Nursing Facility Barriers to Discharge: Continued Medical Work up   Patient Goals and CMS Choice Patient states their goals for this hospitalization and ongoing recovery are:: Go home CMS Medicare.gov Compare Post Acute Care list provided to:: Patient Choice offered to / list presented to : Patient      Expected Discharge Plan and Services       Living arrangements for the past 2 months: Single Family Home                                      Prior Living Arrangements/Services Living arrangements for the past 2 months: Single Family Home Lives with:: Spouse Patient language and need for interpreter reviewed:: Yes Do you feel safe going back to the place where you live?: Yes      Need for Family Participation in Patient Care: Yes (Comment) Care giver support system in place?: Yes (comment) Current home services: DME Criminal Activity/Legal Involvement Pertinent to Current Situation/Hospitalization: No - Comment as needed  Activities of Daily Living Home Assistive Devices/Equipment: Eyeglasses, Raised toilet seat with rails, Other (Comment) (Rollator) ADL Screening (condition at time of admission) Patient's cognitive ability adequate to safely complete daily activities?: Yes Is the patient deaf or have difficulty hearing?: Yes Does the patient have  difficulty seeing, even when wearing glasses/contacts?: No Does the patient have difficulty concentrating, remembering, or making decisions?: No Patient able to express need for assistance with ADLs?: Yes Does the patient have difficulty dressing or bathing?: Yes Independently performs ADLs?: Yes (appropriate for developmental age) Does the patient have difficulty walking or climbing stairs?: Yes Weakness of Legs: Both Weakness of Arms/Hands: None  Permission Sought/Granted                  Emotional Assessment   Attitude/Demeanor/Rapport: Engaged Affect (typically observed): Calm Orientation: : Oriented to Place, Oriented to Self, Oriented to  Time, Oriented to Situation Alcohol / Substance Use: Not Applicable Psych Involvement: No (comment)  Admission diagnosis:  Atrial fibrillation with rapid ventricular response (HCC) [I48.91] Atrial fibrillation with RVR (HCC) [I48.91] Patient Active Problem List   Diagnosis Date Noted   Bilateral leg edema 09/19/2022   Atrial flutter (HCC) 09/19/2022   Atrial flutter with rapid ventricular response (HCC) 09/18/2022   Acute heart failure with preserved ejection fraction (HFpEF) (HCC) 09/18/2022   Atrial fibrillation with rapid ventricular response (HCC) 09/16/2022   Elevated brain natriuretic peptide (BNP) level 09/16/2022   Hypokalemia 09/16/2022   Essential hypertension 09/16/2022   Dyslipidemia 09/16/2022   GERD without esophagitis 09/16/2022   Prediabetes 01/30/2021   Morbid obesity (HCC) 01/30/2021   Chronic fatigue 01/30/2021   Depression, recurrent (HCC) 01/30/2021   Chronic idiopathic constipation 01/30/2021   Need for shingles vaccine 01/30/2021   S/P TKR (total  knee replacement) using cement, 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   BMI 40.0-44.9, adult (HCC) 04/15/2018   Avitaminosis D 02/28/2015   Hypothyroidism 11/13/2014   Anxiety disorder 10/15/2014   Insomnia 09/21/2014   Acid reflux 11/30/2007   Arthritis, degenerative 09/16/2007    Fam hx-ischem heart disease 02/12/2007   Adiposity 02/09/2007   Obstructive apnea 05/06/2006   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   BP (high blood pressure) 03/23/1998   PCP:  Lauro Regulus, MD Pharmacy:   [facility] [address], [place] - [address] [place] [address] Phone: [phone] Fax: [phone]  [id number] [place] - [address] [place] [address] Phone: [phone] Fax: [phone]     Social Determinants of Health (SDOH) Social History: SDOH Screenings   Food Insecurity: No Food Insecurity (09/17/2022)  Housing: Low Risk  (09/17/2022)  Transportation Needs: No Transportation Needs (09/17/2022)  Utilities: Not At Risk (09/17/2022)  Alcohol Screen: Low Risk  (09/12/2021)  Depression (PHQ2-9): Medium Risk (09/12/2021)  Financial Resource Strain: Low Risk  (04/04/2020)  Physical Activity: Inactive (04/04/2020)  Social Connections: Socially Integrated (04/04/2020)  Stress: No Stress Concern Present (04/04/2020)  Tobacco Use: Medium Risk (09/17/2022)   SDOH Interventions:     Readmission Risk Interventions     No data to display

## 2022-09-20 NOTE — Progress Notes (Signed)
Physical Therapy Treatment Patient Details Name: Robin Espinoza MRN: [id number] DOB: Mar 08, 1946 Today's Date: 09/20/2022   History of Present Illness Robin Espinoza is a 77 y.o. Caucasian female with medical history significant for anxiety, depression, hypertension, dyslipidemia, hypothyroidism, overactive bladder, obstructive sleep apnea and osteoarthritis, who presented to the emergency room with acute onset of fall while using her rollator today. She hit the right side of her face with subsequent forehead laceration as well as had a right hand skin tear. Imaging negative for acute fractures.  MD assessment includes: atrial fibrillation with rapid ventricular response, elevated BNP, hypokalemia, and vitamin B12 deficiency.    PT Comments    Pt in bed.  Put wic not managing output.  She is assisted to EOB with mod a x 1.  Has difficulty scooting and positioning hips on EOB but is steady once up.  She is assisted with bath and clothing change due to being soiled.  She was aware she needed a bath but not to the extent of wetness.  She is able to stand with min/mod a x 2 and increased time to straighten LE's.  Initially unsteady and voices some concern of falling backwards but with time she is able to reposition feet to allow for lower body care and changing of briefs.  She is able to continue to walk 12' with RW and min assist with recliner follow for safety.  No LOB or buckling with gait but she does fatigue quickly and chair is needed just outside of her doorway.  Remained up in chair after session with needs met.  She does voice she has been struggling more at home but tries to hide her struggles from her family but sometime she is unable to do so.  Pt may need more support at home once she completes SNF stay.    Recommendations for follow up therapy are one component of a multi-disciplinary discharge planning process, led by the attending physician.  Recommendations may be updated based on patient  status, additional functional criteria and insurance authorization.  Follow Up Recommendations       Assistance Recommended at Discharge Frequent or constant Supervision/Assistance  Patient can return home with the following A lot of help with walking and/or transfers;A little help with bathing/dressing/bathroom;Assistance with cooking/housework;Direct supervision/assist for medications management;Assist for transportation   Equipment Recommendations       Recommendations for Other Services       Precautions / Restrictions Precautions Precautions: Fall Restrictions Weight Bearing Restrictions: No     Mobility  Bed Mobility Overal bed mobility: Needs Assistance Bed Mobility: Supine to Sit     Supine to sit: Mod assist, +2 for physical assistance       Patient Response: Cooperative  Transfers Overall transfer level: Needs assistance Equipment used: Rolling walker (2 wheels) Transfers: Sit to/from Stand Sit to Stand: Min assist, +2 physical assistance, From elevated surface           General transfer comment: increased time and cues    Ambulation/Gait Ambulation/Gait assistance: Min guard Gait Distance (Feet): 12 Feet Assistive device: Rolling walker (2 wheels)   Gait velocity: decreased     General Gait Details: Very slow cadence with short B step length but steady with no overt LOB - recliner follow for safety   Stairs             Wheelchair Mobility    Modified Rankin (Stroke Patients Only)       Balance   Sitting-balance support:  No upper extremity supported, Feet supported Sitting balance-Leahy Scale: Good     Standing balance support: Bilateral upper extremity supported, Reliant on assistive device for balance, During functional activity Standing balance-Leahy Scale: Poor Standing balance comment: initially some imbalances but she does improve with time                            Cognition Arousal/Alertness:  Awake/alert Behavior During Therapy: WFL for tasks assessed/performed Overall Cognitive Status: Within Functional Limits for tasks assessed                                          Exercises Other Exercises Other Exercises: bathing with pt participation for frontal care.    General Comments        Pertinent Vitals/Pain Pain Assessment Pain Assessment: Faces Faces Pain Scale: Hurts little more Pain Location: ankle Pain Descriptors / Indicators: Sore Pain Intervention(s): Monitored during session, Repositioned    Home Living                          Prior Function            PT Goals (current goals can now be found in the care plan section) Progress towards PT goals: Progressing toward goals    Frequency    Min 4X/week      PT Plan Current plan remains appropriate    Co-evaluation              AM-PAC PT "6 Clicks" Mobility   Outcome Measure  Help needed turning from your back to your side while in a flat bed without using bedrails?: A Lot Help needed moving from lying on your back to sitting on the side of a flat bed without using bedrails?: A Lot Help needed moving to and from a bed to a chair (including a wheelchair)?: A Lot Help needed standing up from a chair using your arms (e.g., wheelchair or bedside chair)?: A Lot Help needed to walk in hospital room?: A Little Help needed climbing 3-5 steps with a railing? : Total 6 Click Score: 12    End of Session Equipment Utilized During Treatment: Gait belt Activity Tolerance: Patient tolerated treatment well Patient left: in chair;with call bell/phone within reach;with chair alarm set Nurse Communication: Mobility status PT Visit Diagnosis: Unsteadiness on feet (R26.81);History of falling (Z91.81);Difficulty in walking, not elsewhere classified (R26.2);Muscle weakness (generalized) (M62.81)     Time: 6578-4696 PT Time Calculation (min) (ACUTE ONLY): 26 min  Charges:   $Gait Training: 8-22 mins $Therapeutic Activity: 8-22 mins                   Danielle Dess, PTA 09/20/22, 2:01 PM

## 2022-09-20 NOTE — Progress Notes (Signed)
Rounding Note    Patient Name: Robin Espinoza Date of Encounter: 09/20/2022  [facility] Cardiologist: New  Subjective   Patient is overall feeling good today. She remains in NSR.   Inpatient Medications    Scheduled Meds:  amiodarone  400 mg Oral BID   apixaban  5 mg Oral BID   buPROPion  300 mg Oral Daily   vitamin D3  1,000 Units Oral Daily   cyanocobalamin  1,000 mcg Intramuscular Daily   Followed by   Melene Muller ON 09/25/2022] vitamin B-12  1,000 mcg Oral Daily   furosemide  40 mg Oral Daily   gabapentin  200 mg Oral BID   [START ON 09/28/2022] levothyroxine  88 mcg Oral Q0600   magnesium oxide  400 mg Oral BID   metoprolol succinate  100 mg Oral Daily   mirabegron ER  25 mg Oral Daily   pantoprazole  40 mg Oral Daily   pravastatin  40 mg Oral q1800   traZODone  100 mg Oral QHS   Continuous Infusions:  PRN Meds: acetaminophen **OR** acetaminophen, magnesium hydroxide, ondansetron **OR** ondansetron (ZOFRAN) IV, polyvinyl alcohol, senna, traMADol   Vital Signs    Vitals:   09/19/22 1720 09/19/22 2052 09/20/22 0041 09/20/22 0426  BP: (!) 141/104 (!) 138/95 (!) 141/89 130/72  Pulse: 67 66 62 70  Resp: 20 18 18 18   Temp: 98.7 F (37.1 C) 98.1 F (36.7 C) (!) 97.5 F (36.4 C) 97.6 F (36.4 C)  TempSrc: Oral     SpO2: 97% 100% 100% 100%  Weight:    100.9 kg  Height:        Intake/Output Summary (Last 24 hours) at 09/20/2022 0734 Last data filed at 09/19/2022 2052 Gross per 24 hour  Intake 240 ml  Output 2450 ml  Net -2210 ml      09/20/2022    4:26 AM 09/19/2022    6:00 AM 09/18/2022    4:18 AM  Last 3 Weights  Weight (lbs) 222 lb 7.1 oz 222 lb 0.1 oz 199 lb 15.3 oz  Weight (kg) 100.9 kg 100.7 kg 90.7 kg      Telemetry    SR/SB HR 50-60s - Personally Reviewed  ECG    NO new - Personally Reviewed  Physical Exam   GEN: No acute distress.   Neck: No JVD Cardiac: RRR, no murmurs, rubs, or gallops.  Respiratory: Clear to auscultation  bilaterally. GI: Soft, nontender, non-distended  MS: No edema; No deformity. Neuro:  Nonfocal  Psych: Normal affect   Labs    High Sensitivity Troponin:   Recent Labs  Lab 09/17/22 0844 09/17/22 1120  TROPONINIHS 58* 48*     Chemistry Recent Labs  Lab 09/16/22 1849 09/17/22 0504 09/18/22 0115 09/19/22 0622 09/20/22 0511  NA 138   < > 137 137 137  K 2.9*   < > 3.6 3.8 4.1  CL 107   < > 101 102 102  CO2 24   < > 26 29 29   GLUCOSE 72   < > 109* 96 98  BUN 27*   < > 20 23 24*  CREATININE 0.77   < > 1.00 0.98 0.91  CALCIUM 7.1*   < > 8.4* 8.5* 8.7*  MG 1.9  --  1.8  --  2.0  PROT 5.4*  --   --   --   --   ALBUMIN 3.0*  --   --   --   --  AST 17  --   --   --   --   ALT 14  --   --   --   --   ALKPHOS 72  --   --   --   --   BILITOT 0.5  --   --   --   --   GFRNONAA >60   < > 58* 59* >60  ANIONGAP 7   < > 10 6 6    < > = values in this interval not displayed.    Lipids No results for input(s): "CHOL", "TRIG", "HDL", "LABVLDL", "LDLCALC", "CHOLHDL" in the last 168 hours.  Hematology Recent Labs  Lab 09/18/22 0115 09/19/22 0218 09/20/22 0511  WBC 8.3 8.1 8.5  RBC 4.24 4.13 4.12  HGB 14.2 14.0 13.8  HCT 42.1 41.2 40.9  MCV 99.3 99.8 99.3  MCH 33.5 33.9 33.5  MCHC 33.7 34.0 33.7  RDW 13.5 13.5 13.4  PLT 174 161 176   Thyroid  Recent Labs  Lab 09/16/22 1644 09/16/22 1849 09/20/22 0511  TSH 4.765*  --   --   FREET4  --    < > 0.95   < > = values in this interval not displayed.    BNP Recent Labs  Lab 09/16/22 1644  BNP 454.5*    DDimer No results for input(s): "DDIMER" in the last 168 hours.   Radiology    US RENAL  Result Date: 09/18/2022 CLINICAL DATA:  Incontinence EXAM: RENAL / URINARY TRACT ULTRASOUND COMPLETE COMPARISON:  None Available. FINDINGS: Right Kidney: Renal measurements: 11.0 x 5.3 x 5.0 = volume: 153.8 mL. Echogenicity within normal limits. No mass or hydronephrosis visualized. Left Kidney: Renal measurements: 11.1 x 5.9 x 5.1 cm =  volume: 174 mL. Echogenicity within normal limits. No mass or hydronephrosis visualized. Bladder: Appears normal for degree of bladder distention. Ureteral jets were seen. Other: None. IMPRESSION: No collecting system dilatation. Electronically Signed   By: Jalyne Kays M.D.   On: 09/18/2022 14:50   ECHOCARDIOGRAM COMPLETE  Result Date: 09/18/2022    ECHOCARDIOGRAM REPORT   Patient Name:   Robin Espinoza Date of Exam: 09/18/2022 Medical Rec #:  [id number]     Height:       65.0 in Accession #:    [id number]    Weight:       200.0 lb Date of Birth:  22-Apr-1945    BSA:          1.978 m Patient Age:    76 years      BP:           129/82 mmHg Patient Gender: F             HR:           62 bpm. Exam Location:  [facility] Procedure: 2D Echo, Cardiac Doppler and Color Doppler Indications:     Atrial Fibrillation I48.91  History:         Patient has no prior history of Echocardiogram examinations.                  Risk Factors:Hypertension and Dyslipidemia.  Sonographer:     Cristela Blue Referring Phys:  [id number] JAN A MANSY Diagnosing Phys: Yvonne Kendall MD  Sonographer Comments: Suboptimal apical window. IMPRESSIONS  1. Left ventricular ejection fraction, by estimation, is 55 to 60%. The left ventricle has normal function. Left ventricular endocardial border not optimally defined to evaluate regional wall motion. There is moderate left ventricular  hypertrophy. Left ventricular diastolic parameters are indeterminate.  2. Right ventricular systolic function is low normal. The right ventricular size is normal. Mildly increased right ventricular wall thickness. Tricuspid regurgitation signal is inadequate for assessing PA pressure.  3. The mitral valve is normal in structure. Trivial mitral valve regurgitation.  4. The aortic valve has an indeterminant number of cusps. There is mild calcification of the aortic valve. There is mild thickening of the aortic valve. Aortic valve regurgitation is not visualized. No aortic stenosis is  present. FINDINGS  Left Ventricle: Left ventricular ejection fraction, by estimation, is 55 to 60%. The left ventricle has normal function. Left ventricular endocardial border not optimally defined to evaluate regional wall motion. The left ventricular internal cavity size was normal in size. There is moderate left ventricular hypertrophy. Left ventricular diastolic parameters are indeterminate. Right Ventricle: The right ventricular size is normal. Mildly increased right ventricular wall thickness. Right ventricular systolic function is low normal. Tricuspid regurgitation signal is inadequate for assessing PA pressure. Left Atrium: Left atrial size was normal in size. Right Atrium: Right atrial size was normal in size. Pericardium: The pericardium was not well visualized. Mitral Valve: The mitral valve is normal in structure. Trivial mitral valve regurgitation. Tricuspid Valve: The tricuspid valve is not well visualized. Tricuspid valve regurgitation is trivial. Aortic Valve: The aortic valve has an indeterminant number of cusps. There is mild calcification of the aortic valve. There is mild thickening of the aortic valve. Aortic valve regurgitation is not visualized. No aortic stenosis is present. Aortic valve mean gradient measures 3.5 mmHg. Aortic valve peak gradient measures 5.7 mmHg. Aortic valve area, by VTI measures 1.95 cm. Pulmonic Valve: The pulmonic valve was not well visualized. Pulmonic valve regurgitation is not visualized. No evidence of pulmonic stenosis. Aorta: The aortic root is normal in size and structure. Pulmonary Artery: The pulmonary artery is not well seen. Venous: The inferior vena cava was not well visualized. IAS/Shunts: The interatrial septum was not well visualized.  LEFT VENTRICLE PLAX 2D LVIDd:         4.00 cm   Diastology LVIDs:         2.80 cm   LV e' medial:    6.42 cm/s LV PW:         1.40 cm   LV E/e' medial:  12.2 LV IVS:        1.50 cm   LV e' lateral:   8.49 cm/s LVOT diam:      2.00 cm   LV E/e' lateral: 9.2 LV SV:         44 LV SV Index:   22 LVOT Area:     3.14 cm  RIGHT VENTRICLE RV S prime:     14.70 cm/s TAPSE (M-mode): 1.6 cm LEFT ATRIUM             Index        RIGHT ATRIUM           Index LA diam:        3.80 cm 1.92 cm/m   RA Area:     14.30 cm LA Vol (A2C):   49.3 ml 24.92 ml/m  RA Volume:   30.60 ml  15.47 ml/m LA Vol (A4C):   41.5 ml 20.98 ml/m LA Biplane Vol: 45.5 ml 23.00 ml/m  AORTIC VALVE AV Area (Vmax):    1.62 cm AV Area (Vmean):   1.64 cm AV Area (VTI):     1.95 cm AV Vmax:  119.00 cm/s AV Vmean:          84.800 cm/s AV VTI:            0.224 m AV Peak Grad:      5.7 mmHg AV Mean Grad:      3.5 mmHg LVOT Vmax:         61.50 cm/s LVOT Vmean:        44.200 cm/s LVOT VTI:          0.139 m LVOT/AV VTI ratio: 0.62  AORTA Ao Root diam: 3.00 cm MITRAL VALVE MV Area (PHT): 3.99 cm    SHUNTS MV Decel Time: 190 msec    Systemic VTI:  0.14 m MV E velocity: 78.10 cm/s  Systemic Diam: 2.00 cm MV A velocity: 34.60 cm/s MV E/A ratio:  2.26 Yvonne Kendall MD Electronically signed by Yvonne Kendall MD Signature Date/Time: 09/18/2022/1:41:14 PM    Final     Cardiac Studies   Echo 09/18/22 1. Left ventricular ejection fraction, by estimation, is 55 to 60%. The  left ventricle has normal function. Left ventricular endocardial border  not optimally defined to evaluate regional wall motion. There is moderate  left ventricular hypertrophy. Left  ventricular diastolic parameters are indeterminate.   2. Right ventricular systolic function is low normal. The right  ventricular size is normal. Mildly increased right ventricular wall  thickness. Tricuspid regurgitation signal is inadequate for assessing PA  pressure.   3. The mitral valve is normal in structure. Trivial mitral valve  regurgitation.   4. The aortic valve has an indeterminant number of cusps. There is mild  calcification of the aortic valve. There is mild thickening of the aortic  valve. Aortic  valve regurgitation is not visualized. No aortic stenosis is  present.   Patient Profile     77 y.o. female  with a hx of anxiety, depression, hypertension, dyslipidemia, hypothyroidism, overactive bladder, OSA, OA who is being seen 09/17/2022 for the evaluation of palpitations   Assessment & Plan    A-flutter with RVR - presented with dizziness, weakness, fatigue and a fall (no LOC) found to have rapid aflutter 125bpm started on IV amio with conversion to NSR - continue amiodarone 400mg  BID x 7 days, amiodarone 200mg  BID x 7 days, amiodarone 200mg  daily thereafter - Continue Toprol 100mg  daily - K low 2.9 on admission. Goal>4. keep Mag>2 - she reports compliance with CPAP - Echo showed normal pump function, moderate LVH, trivial MR - CHADSVASC at least 5 (agex2, sex, PAD, HTN). continue Eliquis 5mg  BID. Caution with fall risk.  Elevated BNP - BNP 454 - Echo with normal LVEF, suspect diastolic dysfunction - started on IV lasix>transitioned to lasix 40mg  daily - Net -2.2L - LLE improved   Hypokalemia - K 2.9 s/p potassium - today 4.1 - goal>4   Hypothyroidism - synthroid per IM   Dyslipidemia - LDL 109 - pravastatin 40mg  daily   Hypertension - Toprol 100mg  daily  For questions or updates, please contact [facility] Please consult [email or web address] for contact info under        Signed, Sheri Prows David Stall, PA-C  09/20/2022, 7:34 AM

## 2022-09-20 NOTE — Consult Note (Signed)
[facility] Face-to-Face Psychiatry Consult   Reason for Consult:  polypharmacy Referring Physician:  Dr. Lucianne Muss Patient Identification: Robin Espinoza MRN:  [id number] Principal Diagnosis: Atrial fibrillation with rapid ventricular response [facility]) Diagnosis:  Principal Problem:   Atrial fibrillation with rapid ventricular response (HCC) Active Problems:   Anxiety disorder   Depression, recurrent (HCC)   Hypothyroidism   Elevated brain natriuretic peptide (BNP) level   Hypokalemia   Essential hypertension   Dyslipidemia   GERD without esophagitis   Atrial flutter with rapid ventricular response (HCC)   Acute heart failure with preserved ejection fraction (HFpEF) (HCC)   Bilateral leg edema   Atrial flutter (HCC)   Total Time spent with patient: 45 minutes  Subjective:   Robin Espinoza is a 77 y.o. female patient admitted with a-fib, consult for medication evaluation.  Patient states, "I can't barely walk for a year now", correlating with the start of her Cymbalta which may have contributed to her current symptoms of parkinsonism.  HPI:  Patient alert and oriented X 3. She denies HI/SI/ or AVH. Pt is pleasant, calm, and cooperative. She states she has suffered with depression and anxiety "almost my whole life". States she is anxious about  "everything". However, recent increase in anxiety has been centered around falling. States "I can't barely walk for a year now". Upon our arrival into pt's room, patient was assisted by the NT and her gait was observed and found to be unsteady, requiring the use of a walker. Generalized tremor noticed in both hands. Patient reports pill rolling of her right hand that started around the same time she noticed the tremor 1.5 years ago.States anxiety and depression medications were added 1.5 years ago.  Recommendations noted below in the treatment plan.  Past Psychiatric History: depression and anxiety  Risk to Self:  none Risk to Others:  none Prior Inpatient  Therapy:  none Prior Outpatient Therapy:  none  Past Medical History:  Past Medical History:  Diagnosis Date   Anxiety    Depression    Family history of adverse reaction to anesthesia    sister had a reaction when had a baby   GERD (gastroesophageal reflux disease)    Hyperlipidemia 12/21/2005   Hypertension    Hypothyroidism    Insomnia    OAB (overactive bladder)    Obstructive sleep apnea    Osteoarthrosis    Vitamin D deficiency     Past Surgical History:  Procedure Laterality Date   COLONOSCOPY     X 2   Cortisone injections in back     EYE SURGERY     TOTAL KNEE ARTHROPLASTY 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   Procedure: RIGHT TOTAL KNEE ARTHROPLASTY;  Surgeon: Juanell Fairly, MD;  Location: [facility] ORS;  Service: Orthopedics;  Laterality: Right;   TUBAL LIGATION  1981   Family History:  Family History  Problem Relation Age of Onset   Diabetes Father    Breast cancer Neg Hx    Family Psychiatric  History: none Social History:  Social History   Substance and Sexual Activity  Alcohol Use Yes   Comment: rarely 1 drink     Social History   Substance and Sexual Activity  Drug Use No    Social History   Socioeconomic History   Marital status: Married    Spouse name: Not on file   Number of children: 3   Years of education: Not on file   Highest education level: Master's degree (e.g., MA, MS, MEng, MEd, MSW,  MBA)  Occupational History   Occupation: retired  Tobacco Use   Smoking status: Former   Smokeless tobacco: Never   Tobacco comments:    in Therapist, art Use: Never used  Substance and Sexual Activity   Alcohol use: Yes    Comment: rarely 1 drink   Drug use: No   Sexual activity: Not on file  Other Topics Concern   Not on file  Social History Narrative   Not on file   Social Determinants of Health   Financial Resource Strain: Low Risk  (04/04/2020)   Overall Financial Resource Strain (CARDIA)    Difficulty of Paying Living Expenses: Not  hard at all  Food Insecurity: No Food Insecurity (09/17/2022)   Hunger Vital Sign    Worried About Running Out of Food in the Last Year: Never true    Ran Out of Food in the Last Year: Never true  Transportation Needs: No Transportation Needs (09/17/2022)   PRAPARE - Administrator, Civil Service (Medical): No    Lack of Transportation (Non-Medical): No  Physical Activity: Inactive (04/04/2020)   Exercise Vital Sign    Days of Exercise per Week: 0 days    Minutes of Exercise per Session: 0 min  Stress: No Stress Concern Present (04/04/2020)   [employer] of Occupational Health - Occupational Stress Questionnaire    Feeling of Stress : Not at all  Social Connections: Socially Integrated (04/04/2020)   Social Connection and Isolation Panel [NHANES]    Frequency of Communication with Friends and Family: More than three times a week    Frequency of Social Gatherings with Friends and Family: More than three times a week    Attends Religious Services: More than 4 times per year    Active Member of [employer] or Organizations: Yes    Attends Engineer, structural: More than 4 times per year    Marital Status: Married   Additional Social History:    Allergies:   Allergies  Allergen Reactions   Oxycodone Other (See Comments)    hallucinated   Amlodipine Swelling    Patient has been tolerating low dose    Labs:  Results for orders placed or performed during the hospital encounter of 09/16/22 (from the past 48 hour(s))  CBC     Status: None   Collection Time: 09/19/22  2:18 AM  Result Value Ref Range   WBC 8.1 4.0 - 10.5 K/uL   RBC 4.13 3.87 - 5.11 MIL/uL   Hemoglobin 14.0 12.0 - 15.0 g/dL   HCT 16.1 09.6 - 04.5 %   MCV 99.8 80.0 - 100.0 fL   MCH 33.9 26.0 - 34.0 pg   MCHC 34.0 30.0 - 36.0 g/dL   RDW 40.9 81.1 - 91.4 %   Platelets 161 150 - 400 K/uL   nRBC 0.0 0.0 - 0.2 %    Comment: Performed at [facility], [address] [place], [place]  [address]  T4, free     Status: None   Collection Time: 09/19/22  6:22 AM  Result Value Ref Range   Free T4 0.99 0.61 - 1.12 ng/dL    Comment: (NOTE) Biotin ingestion may interfere with free T4 tests. If the results are inconsistent with the TSH level, previous test results, or the clinical presentation, then consider biotin interference. If needed, order repeat testing after stopping biotin. Performed at [facility], [address] [place], [place] [address]  Basic metabolic panel     Status: Abnormal   Collection Time: 09/19/22  6:22 AM  Result Value Ref Range   Sodium 137 135 - 145 mmol/L   Potassium 3.8 3.5 - 5.1 mmol/L   Chloride 102 98 - 111 mmol/L   CO2 29 22 - 32 mmol/L   Glucose, Bld 96 70 - 99 mg/dL    Comment: Glucose reference range applies only to samples taken after fasting for at least 8 hours.   BUN 23 8 - 23 mg/dL   Creatinine, Ser 5.40 0.44 - 1.00 mg/dL   Calcium 8.5 (L) 8.9 - 10.3 mg/dL   GFR, Estimated 59 (L) >60 mL/min    Comment: (NOTE) Calculated using the CKD-EPI Creatinine Equation (2021)    Anion gap 6 5 - 15    Comment: Performed at [facility], [address] [place], [place] [address]  T4, free     Status: None   Collection Time: 09/20/22  5:11 AM  Result Value Ref Range   Free T4 0.95 0.61 - 1.12 ng/dL    Comment: (NOTE) Biotin ingestion may interfere with free T4 tests. If the results are inconsistent with the TSH level, previous test results, or the clinical presentation, then consider biotin interference. If needed, order repeat testing after stopping biotin. Performed at [facility], [address] [place], [place] [address]   CBC     Status: None   Collection Time: 09/20/22  5:11 AM  Result Value Ref Range   WBC 8.5 4.0 - 10.5 K/uL   RBC 4.12 3.87 - 5.11 MIL/uL   Hemoglobin 13.8 12.0 - 15.0 g/dL   HCT 95.6 21.3 - 08.6 %   MCV 99.3 80.0 - 100.0 fL   MCH 33.5 26.0 - 34.0 pg   MCHC 33.7 30.0 - 36.0  g/dL   RDW 57.8 46.9 - 62.9 %   Platelets 176 150 - 400 K/uL   nRBC 0.0 0.0 - 0.2 %    Comment: Performed at [facility], [address], [place], [place] [address]  Basic metabolic panel     Status: Abnormal   Collection Time: 09/20/22  5:11 AM  Result Value Ref Range   Sodium 137 135 - 145 mmol/L   Potassium 4.1 3.5 - 5.1 mmol/L   Chloride 102 98 - 111 mmol/L   CO2 29 22 - 32 mmol/L   Glucose, Bld 98 70 - 99 mg/dL    Comment: Glucose reference range applies only to samples taken after fasting for at least 8 hours.   BUN 24 (H) 8 - 23 mg/dL   Creatinine, Ser 3.24 0.44 - 1.00 mg/dL   Calcium 8.7 (L) 8.9 - 10.3 mg/dL   GFR, Estimated >40 >10 mL/min    Comment: (NOTE) Calculated using the CKD-EPI Creatinine Equation (2021)    Anion gap 6 5 - 15    Comment: Performed at [facility], [address] [place], [place] [address]  Magnesium     Status: None   Collection Time: 09/20/22  5:11 AM  Result Value Ref Range   Magnesium 2.0 1.7 - 2.4 mg/dL    Comment: Performed at [facility], [address] [place], [place] [address]  Phosphorus     Status: None   Collection Time: 09/20/22  5:11 AM  Result Value Ref Range   Phosphorus 4.0 2.5 - 4.6 mg/dL    Comment: Performed at [facility], [address], [place], [place] [address]  Current Facility-Administered Medications  Medication Dose Route Frequency Provider Last Rate Last Admin   acetaminophen (TYLENOL) tablet 650 mg  650 mg Oral Q6H PRN Mansy, Jan A, MD   650 mg at 09/17/22 2024   Or   acetaminophen (TYLENOL) suppository 650 mg  650 mg Rectal Q6H PRN Mansy, Jan A, MD       amiodarone (PACERONE) tablet 400 mg  400 mg Oral BID End, Cristal Deer, MD   400 mg at 09/20/22 1114   apixaban (ELIQUIS) tablet 5 mg  5 mg Oral BID Mila Merry A, RPH   5 mg at 09/20/22 1115   buPROPion (WELLBUTRIN XL) 24 hr tablet 300 mg  300 mg Oral Daily Mansy, Jan A, MD   300 mg at 09/20/22 1115    cholecalciferol (VITAMIN D3) 25 MCG (1000 UNIT) tablet 1,000 Units  1,000 Units Oral Daily Mansy, Jan A, MD   1,000 Units at 09/20/22 1114   cyanocobalamin (VITAMIN B12) injection 1,000 mcg  1,000 mcg Intramuscular Daily Gillis Santa, MD   1,000 mcg at 09/20/22 1121   Followed by   Melene Muller ON 09/25/2022] cyanocobalamin (VITAMIN B12) tablet 1,000 mcg  1,000 mcg Oral Daily Gillis Santa, MD       furosemide (LASIX) tablet 40 mg  40 mg Oral Daily Debbe Odea, MD   40 mg at 09/20/22 1115   gabapentin (NEURONTIN) capsule 200 mg  200 mg Oral BID Mansy, Jan A, MD   200 mg at 09/20/22 1115   [START ON 09/28/2022] levothyroxine (SYNTHROID) tablet 88 mcg  88 mcg Oral Q0600 Gillis Santa, MD       magnesium hydroxide (MILK OF MAGNESIA) suspension 30 mL  30 mL Oral Daily PRN Mansy, Jan A, MD   30 mL at 09/20/22 1117   magnesium oxide (MAG-OX) tablet 400 mg  400 mg Oral BID Gillis Santa, MD   400 mg at 09/20/22 1115   metoprolol succinate (TOPROL-XL) 24 hr tablet 100 mg  100 mg Oral Daily Mansy, Jan A, MD   100 mg at 09/20/22 1115   mirabegron ER (MYRBETRIQ) tablet 25 mg  25 mg Oral Daily Mansy, Jan A, MD   25 mg at 09/20/22 1114   ondansetron (ZOFRAN) tablet 4 mg  4 mg Oral Q6H PRN Mansy, Jan A, MD       Or   ondansetron [facility]) injection 4 mg  4 mg Intravenous Q6H PRN Mansy, Jan A, MD       pantoprazole (PROTONIX) EC tablet 40 mg  40 mg Oral Daily Mansy, Jan A, MD   40 mg at 09/20/22 1114   polyvinyl alcohol (LIQUIFILM TEARS) 1.4 % ophthalmic solution 1 drop  1 drop Both Eyes PRN Gillis Santa, MD       pravastatin (PRAVACHOL) tablet 40 mg  40 mg Oral q1800 Mansy, Jan A, MD   40 mg at 09/19/22 1705   senna (SENOKOT) tablet 17.2 mg  2 tablet Oral QHS PRN Mansy, Jan A, MD       traMADol Janean Sark) tablet 50 mg  50 mg Oral BID PRN Mansy, Jan A, MD   50 mg at 09/20/22 0855   traZODone (DESYREL) tablet 100 mg  100 mg Oral QHS Mansy, Jan A, MD   100 mg at 09/19/22 2033    Musculoskeletal: Strength & Muscle  Tone: within normal limits Gait & Station: unsteady Patient leans: N/A  Psychiatric Specialty Exam: Physical Exam Vitals and nursing note reviewed.  Constitutional:  Appearance: Normal appearance.  HENT:     Head: Normocephalic.     Nose: Nose normal.  Pulmonary:     Effort: Pulmonary effort is normal.  Musculoskeletal:        General: Normal range of motion.     Cervical back: Normal range of motion.  Neurological:     General: No focal deficit present.     Mental Status: She is alert and oriented to person, place, and time.  Psychiatric:        Attention and Perception: Attention and perception normal.        Mood and Affect: Mood is anxious.        Speech: Speech normal.        Behavior: Behavior normal. Behavior is cooperative.        Thought Content: Thought content normal.        Cognition and Memory: Cognition and memory normal.        Judgment: Judgment normal.     Review of Systems  Psychiatric/Behavioral:  The patient is nervous/anxious.   All other systems reviewed and are negative.   Blood pressure 138/87, pulse 61, temperature 98.4 F (36.9 C), resp. rate 14, height 5\' 5"  (1.651 m), weight 100.9 kg, SpO2 100 %.Body mass index is 37.02 kg/m.  General Appearance: Fairly Groomed  Eye Contact:  Good  Speech:  Normal Rate  Volume:  Normal  Mood:  Anxiety  Affect:  Congruent  Thought Process:  Coherent  Orientation:  Full (Time, Place, and Person)  Thought Content:  WDL and Logical  Suicidal Thoughts:  No  Homicidal Thoughts:  No  Memory:  Immediate;   Good  Judgement:  Good  Insight:  Good  Psychomotor Activity:  Tremor, bilateral with an increase in right hand  Concentration:  Concentration: Good  Recall:  Good  Fund of Knowledge:  Good  Language:  Good  Akathisia:  No  Handed:  Right  AIMS (if indicated):     Assets:  Architect Housing Social Support  ADL's:  Impaired  Cognition:  WNL  Sleep:         Physical Exam: Physical Exam Vitals and nursing note reviewed.  Constitutional:      Appearance: Normal appearance.  HENT:     Head: Normocephalic.     Nose: Nose normal.  Pulmonary:     Effort: Pulmonary effort is normal.  Musculoskeletal:        General: Normal range of motion.     Cervical back: Normal range of motion.  Neurological:     General: No focal deficit present.     Mental Status: She is alert and oriented to person, place, and time.  Psychiatric:        Attention and Perception: Attention and perception normal.        Mood and Affect: Mood is anxious.        Speech: Speech normal.        Behavior: Behavior normal. Behavior is cooperative.        Thought Content: Thought content normal.        Cognition and Memory: Cognition and memory normal.        Judgment: Judgment normal.    Review of Systems  Psychiatric/Behavioral:  The patient is nervous/anxious.   All other systems reviewed and are negative.  Blood pressure 138/87, pulse 61, temperature 98.4 F (36.9 C), resp. rate 14, height 5\' 5"  (1.651 m), weight 100.9 kg, SpO2 100 %. Body  mass index is 37.02 kg/m.  Treatment Plan Summary: General anxiety disorder: Prior to admission she was taking Effexor 75 mg daily, Wellbutrin 300 mg daily, Cymbalta 60 mg daily, Buspar 15 mg daily, and Trazodone 100 mg at bedtime. The Cymbalta, Buspar, and Effexor were not continued inpatient, past 3 days. Recommendation:  Continue the Wellbutrin 300 mg daily and Trazodone for now.  An outpatient geriatric specialty psychiatrist can assist in cross titrating her to something like Lexapro as Wellbutrin increases anxiety.  However, Cymbalta and Effexor, SNRIs, can cause her parkinsonism.    Disposition: Patient does not meet criteria for psychiatric inpatient admission.  Nanine Means, NP 09/20/2022 2:11 PM

## 2022-09-20 NOTE — Progress Notes (Signed)
[facility] Progress Note  Patient: Robin Espinoza    ZOX:[id number]  DOA: 09/16/2022     Date of Service: the patient was seen and examined on 09/20/2022  Chief Complaint  Patient presents with   Fall   Palpitations   Brief hospital course: BIRUTE JURKOVIC is a 77 y.o. Caucasian female with medical history significant for anxiety, depression, hypertension, dyslipidemia, hypothyroidism, overactive bladder, obstructive sleep apnea and osteoarthritis, who presented to the emergency room with acute onset of fall while using her rollator today.  She stated that she felt lightheaded and has been feeling fatigued lately.  She hit the right side of her face with subsequent forehead laceration as well as had a right hand skin tear.  She denies any loss of consciousness.    ED Course: When she came to the ER BP was 116/91 with a heart rate of 143 and  respiratory rate of 20, temperature 98.1 and pulse currently 96% on room air.  Labs revealed hypokalemia of 2.9, BUN of 27 and albumin 3 with total protein of 5.4.  BNP was 454.5.  CBC showed hemoconcentration and macrocytosis.  TSH was 4.76 and free T4 1.22 both minimally elevated. EKG as reviewed by me : EKG showed atrial fibrillation with rapid ventricular response of 125 with low voltage QRS. Imaging: Portable chest x-ray showed no acute cardiopulmonary disease. Noncontrasted head CT scan showed no acute intracranial abnormalities. C-spine CT showed no acute findings.  There were multilevel severe degenerative changes of the spine with associated multilevel posterior disc osteophyte complex formations and associated osseous neuroforaminal stenosis at both C3-C4 and C4-C5 levels. Lumbar spine CT without contrast revealed the following: 1. No acute displaced fracture or traumatic listhesis of the lumbar spine. 2. Multilevel posterior disc osteophyte complex formation with associated left L4-L5 as well as T11-T12 and T10-T11 severe osseous neural  foraminal stenosis. Suggestion of at least moderate to severe osseous neural foraminal stenosis at the L1-L2 and T10-T11. 3.  Aortic Atherosclerosis (ICD10-I70.0). 4. 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 Recommend follow-up ultrasound every 3 years. This recommendation follows ACR consensus guidelines.   Patient received IV diltiazem bolus followed by drip.  Cardiology was consulted.  Assessment and Plan:   Atrial fibrillation with rapid ventricular response, now SR brady S/p diltiazem infusion.  Cardiology switched to p.o. diltiazem and Toprol, also added IV amiodarone for better rate control.  Switch to oral amiodarone 400 mg p.o. BID x 7day, 200 mg po BID x day and the 200 mg pod TTE LVEF 55 to 60%, moderate LV hypertrophy.  -s/p Heparin infusion, transition to Eliquis 5 mg p.o. BID on 6/28 -Cardiology following.     Elevated brain natriuretic peptide (BNP) level No prior history of heart failure.  TTE as above -Cardiology started her on IV Lasix, switch to oral Lasix 40 mg p.o. daily from today   Hypokalemia, Resolved  K 3.6--3.8 now -Replete potassium and monitor   Hypothyroidism TSH 4.76, free T41.22 Both elevated Patient is on Synthroid 88 mcg, Due to high free T4 level, I held Synthroid for now.  It is recommended to check TSH level only in the hypothyroid patients who are on Synthroid, we do not need to check free T4 level.  I do not know why it was ordered but it was elevated.  I am not sure whether she had true hypothyroidism or she had subclinical hypothyroidism? We will continue monitor free T4 level and patient was recommended to follow with endocrinologist as an  outpatient. FT4 level 0.95 wnl, improved F/u Free T4 level again in am    Depression, recurrent and anxiety - We will continue her Wellbutrin XL, Effexor XR and Cymbalta. 6/30 it seems patient is developing extraparametal symptoms, parkinsonism Consulted psych for medication optimization and  decrease polypharmacy, patient is feeling weak and tired and fatigued and sleepy most of the time Follow psych consult  GERD without esophagitis - We will continue PPI therapy.   Dyslipidemia - We will continue statin therapy.   Essential hypertension - Home losartan and HCTZ was discontinued by cardiology to make a room for rate controlling medications.  Currently on diltiazem, metoprolol and Lasix     Vitamin B12 deficiency: B 12 level 248 goal >400, started vitamin B12 1000 mcg IM injection daily during hospital stay, followed by oral supplement.  Follow-up PCP to repeat vitamin B12 level after 3 to 6 months.  Body mass index is 37.02 kg/m.  Interventions:  Diet: Heart healthy DVT Prophylaxis: Therapeutic Anticoagulation with Eliquis    Advance goals of care discussion: DNR  Family Communication: family was present at bedside, at the time of interview.  The pt provided permission to discuss medical plan with the family. Opportunity was given to ask question and all questions were answered satisfactorily.   Disposition:  Pt is from Home, admitted with A-fib with RVR, s/p hep and Amio gtt, now sinus brady, which precludes a safe discharge. Discharge to home, when cleared by cardiology.  Subjective: No significant events overnight, patient thinks that she is improving but still has weakness and ambulatory dysfunction.  Denies any specific complaints, no chest pain or palpitation, no shortness of breath.  Patient was wondering why she is more sleepy, explained regarding antipsychotic medications and polypharmacy.  Patient stated that she is most of the time depressed and has a lot of anxiety.  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  Counseling was done, patient agreed for psych consult to adjust medications. Patient is also having some tremor in the right hand, and noticed some rigidity.  Possible developing parkinsonism   Physical Exam: General: NAD, lying  comfortably Appear in no distress, affect appropriate Eyes: PERRLA ENT: Oral Mucosa Clear, moist  Neck: no JVD,  Cardiovascular: Sinus bradycardia, S1-S2 audible, no Murmur,  Respiratory: good respiratory effort, Bilateral Air entry equal and Decreased, no Crackles, no wheezes Abdomen: Bowel Sound present, Soft and no tenderness,  Skin: no rashes Extremities: no Pedal edema, no calf tenderness Neurologic: without any new focal findings Gait not checked due to patient safety concerns  Vitals:   09/20/22 0041 09/20/22 0426 09/20/22 0829 09/20/22 1152  BP: (!) 141/89 130/72 137/86 138/87  Pulse: 62 70 (!) 59 61  Resp: 18 18 18 14   Temp: (!) 97.5 F (36.4 C) 97.6 F (36.4 C) 97.7 F (36.5 C) 98.4 F (36.9 C)  TempSrc:      SpO2: 100% 100% 99% 100%  Weight:  100.9 kg    Height:        Intake/Output Summary (Last 24 hours) at 09/20/2022 1433 Last data filed at 09/20/2022 0700 Gross per 24 hour  Intake 405.23 ml  Output 850 ml  Net -444.77 ml   Filed Weights   09/18/22 0418 09/19/22 0600 09/20/22 0426  Weight: 90.7 kg 100.7 kg 100.9 kg    Data Reviewed: I have personally reviewed and interpreted daily labs, tele strips, imagings as discussed above. I reviewed all nursing notes, pharmacy notes, vitals, pertinent old records I have discussed  plan of care as described above with RN and patient/family.  CBC: Recent Labs  Lab 09/16/22 1644 09/17/22 0504 09/18/22 0115 09/19/22 0218 09/20/22 0511  WBC 9.8 9.9 8.3 8.1 8.5  NEUTROABS 7.0  --   --   --   --   HGB 15.3* 14.5 14.2 14.0 13.8  HCT 47.2* 44.6 42.1 41.2 40.9  MCV 100.9* 101.4* 99.3 99.8 99.3  PLT 208 165 174 161 176   Basic Metabolic Panel: Recent Labs  Lab 09/16/22 1849 09/17/22 0504 09/18/22 0115 09/19/22 0622 09/20/22 0511  NA 138 138 137 137 137  K 2.9* 3.4* 3.6 3.8 4.1  CL 107 103 101 102 102  CO2 24 27 26 29 29   GLUCOSE 72 128* 109* 96 98  BUN 27* 21 20 23  24*  CREATININE 0.77 0.74 1.00 0.98  0.91  CALCIUM 7.1* 8.0* 8.4* 8.5* 8.7*  MG 1.9  --  1.8  --  2.0  PHOS  --   --   --   --  4.0    Studies: No results found.  Scheduled Meds:  amiodarone  400 mg Oral BID   apixaban  5 mg Oral BID   buPROPion  300 mg Oral Daily   vitamin D3  1,000 Units Oral Daily   cyanocobalamin  1,000 mcg Intramuscular Daily   Followed by   Melene Muller ON 09/25/2022] vitamin B-12  1,000 mcg Oral Daily   furosemide  40 mg Oral Daily   gabapentin  200 mg Oral BID   [START ON 09/28/2022] levothyroxine  88 mcg Oral Q0600   magnesium oxide  400 mg Oral BID   metoprolol succinate  100 mg Oral Daily   mirabegron ER  25 mg Oral Daily   pantoprazole  40 mg Oral Daily   pravastatin  40 mg Oral q1800   traZODone  100 mg Oral QHS   Continuous Infusions:   PRN Meds: acetaminophen **OR** acetaminophen, magnesium hydroxide, ondansetron **OR** ondansetron (ZOFRAN) IV, polyvinyl alcohol, senna, traMADol  Time spent: 50 minutes  Author: Gillis Santa. MD [facility] 09/20/2022 2:33 PM  To reach On-call, see care teams to locate the attending and reach out to them via [email or web address]. If 7PM-7AM, please contact night-coverage If you still have difficulty reaching the attending provider, please page the [facility] (Director on Call) for [facility] on amion for assistance.

## 2022-09-20 NOTE — [place] FL2 (Signed)
[place] MEDICAID FL2 LEVEL OF CARE FORM     IDENTIFICATION  Patient Name: Robin Espinoza Birthdate: [birth year] Sex: female Admission Date (Current Location): 09/16/2022  [place] and [place] Number:  Chiropodist and Address:  [facility], [address], [place], [place] [address]      Provider Number: [id number]  Attending Physician Name and Address:  Gillis Santa, MD  Relative Name and Phone Number:  A[place]iz, [place], [phone]    Current Level of Care: Hospital Recommended Level of Care: Skilled Nursing Facility Prior Approval Number:    Date Approved/Denied:   PASRR Number: [id number]  Discharge Plan: SNF    Current Diagnoses: Patient Active Problem List   Diagnosis Date Noted   Bilateral leg edema 09/19/2022   Atrial flutter (HCC) 09/19/2022   Atrial flutter with rapid ventricular response (HCC) 09/18/2022   Acute heart failure with preserved ejection fraction (HFpEF) (HCC) 09/18/2022   Atrial fibrillation with rapid ventricular response (HCC) 09/16/2022   Elevated brain natriuretic peptide (BNP) level 09/16/2022   Hypokalemia 09/16/2022   Essential hypertension 09/16/2022   Dyslipidemia 09/16/2022   GERD without esophagitis 09/16/2022   Prediabetes 01/30/2021   Morbid obesity (HCC) 01/30/2021   Chronic fatigue 01/30/2021   Depression, recurrent (HCC) 01/30/2021   Chronic idiopathic constipation 01/30/2021   Need for shingles vaccine 01/30/2021   S/P TKR (total knee replacement) using cement, 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   BMI 40.0-44.9, adult (HCC) 04/15/2018   Avitaminosis D 02/28/2015   Hypothyroidism 11/13/2014   Anxiety disorder 10/15/2014   Insomnia 09/21/2014   Acid reflux 11/30/2007   Arthritis, degenerative 09/16/2007   Fam hx-ischem heart disease 02/12/2007   Adiposity 02/09/2007   Obstructive apnea 05/06/2006   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   BP (high blood pressure) 03/23/1998     Orientation RESPIRATION BLADDER Height & Weight     Self, Time, Situation, Place  Normal Incontinent Weight: 222 lb 7.1 oz (100.9 kg) Height:  5\' 5"  (165.1 cm)  BEHAVIORAL SYMPTOMS/MOOD NEUROLOGICAL BOWEL NUTRITION STATUS      Incontinent Diet (See discharge summary)  AMBULATORY STATUS COMMUNICATION OF NEEDS Skin   Extensive Assist Verbally Normal                       Personal Care Assistance Level of Assistance  Bathing, Feeding, Dressing Bathing Assistance: Limited assistance Feeding assistance: Independent Dressing Assistance: Limited assistance     Functional Limitations Info  Sight, Hearing, Speech Sight Info: Impaired (Wears glasses) Hearing Info: Impaired (Hard of hearing) Speech Info: Adequate    SPECIAL CARE FACTORS FREQUENCY  PT (By licensed PT), OT (By licensed OT)     PT Frequency: MIN 4X WEEKLY OT Frequency: MIN 1X WEEKLY            Contractures Contractures Info: Not present    Additional Factors Info  Code Status, Allergies Code Status Info: DNR Allergies Info: Oxycodone, Amlodipine           Current Medications (09/20/2022):  This is the current hospital active medication list Current Facility-Administered Medications  Medication Dose Route Frequency Provider Last Rate Last Admin   acetaminophen (TYLENOL) tablet 650 mg  650 mg Oral Q6H PRN Mansy, Jan A, MD   650 mg at 09/17/22 2024   Or   acetaminophen (TYLENOL) suppository 650 mg  650 mg Rectal Q6H PRN Mansy, Jan A, MD       amiodarone (PACERONE) tablet 400 mg  400 mg  Oral BID End, Cristal Deer, MD   400 mg at 09/19/22 2033   apixaban (ELIQUIS) tablet 5 mg  5 mg Oral BID Mila Merry A, RPH   5 mg at 09/19/22 2033   buPROPion (WELLBUTRIN XL) 24 hr tablet 300 mg  300 mg Oral Daily Mansy, Jan A, MD   300 mg at 09/19/22 1003   cholecalciferol (VITAMIN D3) 25 MCG (1000 UNIT) tablet 1,000 Units  1,000 Units Oral Daily Mansy, Jan A, MD   1,000 Units at 09/19/22 1003   cyanocobalamin  (VITAMIN B12) injection 1,000 mcg  1,000 mcg Intramuscular Daily Gillis Santa, MD   1,000 mcg at 09/19/22 1014   Followed by   Melene Muller ON 09/25/2022] cyanocobalamin (VITAMIN B12) tablet 1,000 mcg  1,000 mcg Oral Daily Gillis Santa, MD       furosemide (LASIX) tablet 40 mg  40 mg Oral Daily Agbor-Etang, Arlys John, MD       gabapentin (NEURONTIN) capsule 200 mg  200 mg Oral BID Mansy, Jan A, MD   200 mg at 09/19/22 2033   [START ON 09/28/2022] levothyroxine (SYNTHROID) tablet 88 mcg  88 mcg Oral Q0600 Gillis Santa, MD       magnesium hydroxide (MILK OF MAGNESIA) suspension 30 mL  30 mL Oral Daily PRN Mansy, Jan A, MD       magnesium oxide (MAG-OX) tablet 400 mg  400 mg Oral BID Gillis Santa, MD   400 mg at 09/19/22 2033   metoprolol succinate (TOPROL-XL) 24 hr tablet 100 mg  100 mg Oral Daily Mansy, Jan A, MD   100 mg at 09/19/22 1004   mirabegron ER (MYRBETRIQ) tablet 25 mg  25 mg Oral Daily Mansy, Jan A, MD   25 mg at 09/19/22 1014   ondansetron (ZOFRAN) tablet 4 mg  4 mg Oral Q6H PRN Mansy, Jan A, MD       Or   ondansetron [facility]) injection 4 mg  4 mg Intravenous Q6H PRN Mansy, Jan A, MD       pantoprazole (PROTONIX) EC tablet 40 mg  40 mg Oral Daily Mansy, Jan A, MD   40 mg at 09/19/22 1004   polyvinyl alcohol (LIQUIFILM TEARS) 1.4 % ophthalmic solution 1 drop  1 drop Both Eyes PRN Gillis Santa, MD       pravastatin (PRAVACHOL) tablet 40 mg  40 mg Oral q1800 Mansy, Jan A, MD   40 mg at 09/19/22 1705   senna (SENOKOT) tablet 17.2 mg  2 tablet Oral QHS PRN Mansy, Jan A, MD       traMADol Janean Sark) tablet 50 mg  50 mg Oral BID PRN Mansy, Jan A, MD   50 mg at 09/20/22 0855   traZODone (DESYREL) tablet 100 mg  100 mg Oral QHS Mansy, Jan A, MD   100 mg at 09/19/22 2033     Discharge Medications: Please see discharge summary for a list of discharge medications.  Relevant Imaging Results:  Relevant Lab Results:   Additional Information SS# [id number]  Susa Simmonds, LCSWA

## 2022-09-21 DIAGNOSIS — I4891 Unspecified atrial fibrillation: Secondary | ICD-10-CM | POA: Diagnosis not present

## 2022-09-21 LAB — TSH: TSH: 6.597 u[IU]/mL — ABNORMAL HIGH (ref 0.350–4.500)

## 2022-09-21 LAB — T4, FREE: Free T4: 1.03 ng/dL (ref 0.61–1.12)

## 2022-09-21 MED ORDER — METOPROLOL SUCCINATE ER 50 MG PO TB24
50.0000 mg | ORAL_TABLET | Freq: Every day | ORAL | Status: DC
  Administered 2022-09-22 – 2022-09-23 (×2): 50 mg via ORAL
  Filled 2022-09-21 (×2): qty 1

## 2022-09-21 NOTE — Progress Notes (Signed)
Physical Therapy Treatment Patient Details Name: Robin Espinoza MRN: [id number] DOB: April 29, 1945 Today's Date: 09/21/2022   History of Present Illness Robin Espinoza is a 77 y.o. Caucasian female with medical history significant for anxiety, depression, hypertension, dyslipidemia, hypothyroidism, overactive bladder, obstructive sleep apnea and osteoarthritis, who presented to the emergency room with acute onset of fall while using her rollator today. She hit the right side of her face with subsequent forehead laceration as well as had a right hand skin tear. Imaging negative for acute fractures.  MD assessment includes: atrial fibrillation with rapid ventricular response, elevated BNP, hypokalemia, and vitamin B12 deficiency.    PT Comments    Pt ready for session, excited to get out of bed.  She is given time but struggles to EOB with mod a x 1.  Time and cues for sitting balance.  She is able to stand from elevated bed with min a x 2 but mod a x 2 from recliner.  She walks 12' x 1 and 20' x 1 with RW and min assist with recliner follow to allow for max gait distance and safety.  Fatigued from effort but opts to remain up in chair.   Recommendations for follow up therapy are one component of a multi-disciplinary discharge planning process, led by the attending physician.  Recommendations may be updated based on patient status, additional functional criteria and insurance authorization.  Follow Up Recommendations       Assistance Recommended at Discharge Frequent or constant Supervision/Assistance  Patient can return home with the following A lot of help with walking and/or transfers;A little help with bathing/dressing/bathroom;Assistance with cooking/housework;Direct supervision/assist for medications management;Assist for transportation   Equipment Recommendations       Recommendations for Other Services       Precautions / Restrictions Precautions Precautions: Fall Restrictions Weight  Bearing Restrictions: No     Mobility  Bed Mobility Overal bed mobility: Needs Assistance Bed Mobility: Supine to Sit     Supine to sit: Mod assist       Patient Response: Cooperative  Transfers Overall transfer level: Needs assistance Equipment used: Rolling walker (2 wheels) Transfers: Sit to/from Stand Sit to Stand: Min assist, +2 physical assistance, From elevated surface           General transfer comment: increased time and cues    Ambulation/Gait Ambulation/Gait assistance: Min guard, Min assist Gait Distance (Feet): 20 Feet Assistive device: Rolling walker (2 wheels) Gait Pattern/deviations: Step-through pattern, Decreased step length - right, Decreased step length - left, Trunk flexed Gait velocity: decreased     General Gait Details: 32' then 20'  - cues to step up into walker   Stairs             Wheelchair Mobility    Modified Rankin (Stroke Patients Only)       Balance Overall balance assessment: Needs assistance Sitting-balance support: No upper extremity supported, Feet supported Sitting balance-Leahy Scale: Good     Standing balance support: Bilateral upper extremity supported, Reliant on assistive device for balance, During functional activity Standing balance-Leahy Scale: Poor Standing balance comment: initially some imbalances but she does improve with time                            Cognition Arousal/Alertness: Awake/alert Behavior During Therapy: WFL for tasks assessed/performed Overall Cognitive Status: Within Functional Limits for tasks assessed  Exercises      General Comments        Pertinent Vitals/Pain Pain Assessment Pain Assessment: Faces Faces Pain Scale: Hurts little more Pain Location: ankle Pain Descriptors / Indicators: Sore Pain Intervention(s): Monitored during session, Repositioned    Home Living                           Prior Function            PT Goals (current goals can now be found in the care plan section) Progress towards PT goals: Progressing toward goals    Frequency    Min 4X/week      PT Plan Current plan remains appropriate    Co-evaluation              AM-PAC PT "6 Clicks" Mobility   Outcome Measure  Help needed turning from your back to your side while in a flat bed without using bedrails?: A Lot Help needed moving from lying on your back to sitting on the side of a flat bed without using bedrails?: A Lot Help needed moving to and from a bed to a chair (including a wheelchair)?: A Lot Help needed standing up from a chair using your arms (e.g., wheelchair or bedside chair)?: A Lot Help needed to walk in hospital room?: A Little Help needed climbing 3-5 steps with a railing? : Total 6 Click Score: 12    End of Session Equipment Utilized During Treatment: Gait belt Activity Tolerance: Patient tolerated treatment well Patient left: in chair;with call bell/phone within reach;with chair alarm set Nurse Communication: Mobility status PT Visit Diagnosis: Unsteadiness on feet (R26.81);History of falling (Z91.81);Difficulty in walking, not elsewhere classified (R26.2);Muscle weakness (generalized) (M62.81)     Time: 1610-9604 PT Time Calculation (min) (ACUTE ONLY): 22 min  Charges:  $Gait Training: 8-22 mins                   Danielle Dess, PTA 09/21/22, 10:51 AM

## 2022-09-21 NOTE — Progress Notes (Signed)
Progress Note   Patient: Robin Espinoza ZOX:[id number] DOB: 01-Dec-1945 DOA: 09/16/2022     5 DOS: the patient was seen and examined on 09/21/2022    Subjective:  Patient seen and examined at bedside this morning She admits to improvement in her condition Denies nausea or vomiting or abdominal pain chest pain cough    Brief hospital course:  From HPI "Robin Espinoza is a 77 y.o. Caucasian female with medical history significant for anxiety, depression, hypertension, dyslipidemia, hypothyroidism, overactive bladder, obstructive sleep apnea and osteoarthritis, who presented to the emergency room with acute onset of fall while using her rollator today.  She stated that she felt lightheaded and has been feeling fatigued lately.  She hit the right side of her face with subsequent forehead laceration as well as had a right hand skin tear.  She denies any loss of consciousness.    ED Course: When she came to the ER BP was 116/91 with a heart rate of 143 and  respiratory rate of 20, temperature 98.1 and pulse currently 96% on room air.  Labs revealed hypokalemia of 2.9, BUN of 27 and albumin 3 with total protein of 5.4.  BNP was 454.5.  CBC showed hemoconcentration and macrocytosis.  TSH was 4.76 and free T4 1.22 both minimally elevated. EKG as reviewed by me : EKG showed atrial fibrillation with rapid ventricular response of 125 with low voltage QRS. Imaging: Portable chest x-ray showed no acute cardiopulmonary disease. Noncontrasted head CT scan showed no acute intracranial abnormalities. C-spine CT showed no acute findings.  There were multilevel severe degenerative changes of the spine with associated multilevel posterior disc osteophyte complex formations and associated osseous neuroforaminal stenosis at both C3-C4 and C4-C5 levels. Lumbar spine CT without contrast revealed the following: 1. No acute displaced fracture or traumatic listhesis of the lumbar spine. 2. Multilevel posterior disc  osteophyte complex formation with associated left L4-L5 as well as T11-T12 and T10-T11 severe osseous neural foraminal stenosis. Suggestion of at least moderate to severe osseous neural foraminal stenosis at the L1-L2 and T10-T11. 3.  Aortic Atherosclerosis (ICD10-I70.0). 4. 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 Recommend follow-up ultrasound every 3 years. This recommendation follows ACR consensus guidelines. Patient received IV diltiazem bolus followed by drip.  Cardiology was consulted".   Assessment and plan  Atrial fibrillation with rapid ventricular response,  Currently rate controlled S/p diltiazem infusion Cardiologist on board and have switched to p.o. diltiazem as well as metoprolol cardiology switched to p.o. diltiazem and Toprol,  Currently on oral amiodarone 400 mg p.o. BID x 7day, 200 mg po BID x day and the 200 mg pod TTE LVEF 55 to 60%, moderate LV hypertrophy.  -Cardiologist on board we appreciate input Continue Eliquis   Elevated brain natriuretic peptide (BNP) level No prior history of heart failure.  TTE as above Continue oral Lasix as recommended by cardiologist  Hypokalemia, Resolved  Continue potassium repletion and monitoring   Hypothyroidism Continue levothyroxine   Depression, recurrent and anxiety - We will continue her Wellbutrin XL, Effexor XR and Cymbalta. 6/30 it seems patient is developing extraparametal symptoms, parkinsonism Consulted psych for medication optimization and decrease polypharmacy, patient is feeling weak and tired and fatigued and sleepy most of the time Outpatient follow-up with psych   GERD without esophagitis Continue PPI therapy.   Dyslipidemia Continue statin therapy.   Essential hypertension - Home losartan and HCTZ was discontinued by cardiology to make a room for rate controlling medications.  Currently on diltiazem, metoprolol  and Lasix     Vitamin B12 deficiency: B 12 level 248 goal >400, started vitamin  B12 1000 mcg IM injection daily during hospital stay, followed by oral supplement.  Follow-up PCP to repeat vitamin B12 level after 3 to 6 months.   Body mass index is 37.02 kg/m.  Interventions: Counseled on weight loss with diet and exercise when medically stable   Diet: Heart healthy DVT Prophylaxis: Therapeutic Anticoagulation with Eliquis     Advance goals of care discussion: DNR   Family Communication: family was present at bedside, at the time of interview.  The pt provided permission to discuss medical plan with the family. Opportunity was given to ask question and all questions were answered satisfactorily.       Physical Exam: General: NAD, lying comfortably Appear in no distress, affect appropriate Eyes: PERRLA ENT: Oral Mucosa Clear, moist  Neck: no JVD,  Cardiovascular: Sinus bradycardia, S1-S2 audible, no Murmur,  Respiratory: good respiratory effort, Bilateral Air entry equal and Decreased, no Crackles, no wheezes Abdomen: Bowel Sound present, Soft and no tenderness,  Skin: no rashes Extremities: no Pedal edema, no calf tenderness Neurologic: without any new focal findings Gait not checked due to patient safety concerns     Vitals:   09/21/22 0757 09/21/22 0956 09/21/22 1212 09/21/22 1253  BP: (!) 140/77 137/88 (!) 143/104 (!) 123/96  Pulse: (!) 58 (!) 58 63 64  Resp: 18  18 18   Temp: 98.5 F (36.9 C)  98.2 F (36.8 C) 98.3 F (36.8 C)  TempSrc:    Oral  SpO2: 100%  99% 98%  Weight:      Height:        Data Reviewed: I reviewed the patient's CBC, BMP results   Author: Loyce Dys, MD 09/21/2022 4:24 PM  For on call review [email or web address].

## 2022-09-21 NOTE — Progress Notes (Signed)
Rounding Note    Patient Name: Robin Espinoza Date of Encounter: 09/21/2022  [facility] Cardiologist: None  New with consult done by Dr Azucena Cecil Subjective   Patient seen on a.m. rounds.  Continues to remain sleepy.  Sinus tachycardia continues to be noted on telemetry monitoring.  Inpatient Medications    Scheduled Meds:  amiodarone  400 mg Oral BID   apixaban  5 mg Oral BID   buPROPion  300 mg Oral Daily   vitamin D3  1,000 Units Oral Daily   cyanocobalamin  1,000 mcg Intramuscular Daily   Followed by   Melene Muller ON 09/25/2022] vitamin B-12  1,000 mcg Oral Daily   furosemide  40 mg Oral Daily   gabapentin  200 mg Oral BID   [START ON 09/28/2022] levothyroxine  88 mcg Oral Q0600   metoprolol succinate  100 mg Oral Daily   mirabegron ER  25 mg Oral Daily   pantoprazole  40 mg Oral Daily   pravastatin  40 mg Oral q1800   traZODone  100 mg Oral QHS   Continuous Infusions:  PRN Meds: acetaminophen **OR** acetaminophen, magnesium hydroxide, ondansetron **OR** ondansetron (ZOFRAN) IV, polyvinyl alcohol, senna, traMADol   Vital Signs    Vitals:   09/21/22 0512 09/21/22 0541 09/21/22 0757 09/21/22 0956  BP: 130/68  (!) 140/77 137/88  Pulse: (!) 51  (!) 58 (!) 58  Resp: 19  18   Temp: 98.1 F (36.7 C)  98.5 F (36.9 C)   TempSrc:      SpO2: 97%  100%   Weight:  102.9 kg    Height:        Intake/Output Summary (Last 24 hours) at 09/21/2022 1205 Last data filed at 09/21/2022 1116 Gross per 24 hour  Intake 600 ml  Output 1650 ml  Net -1050 ml      09/21/2022    5:41 AM 09/20/2022    4:26 AM 09/19/2022    6:00 AM  Last 3 Weights  Weight (lbs) 226 lb 13.7 oz 222 lb 7.1 oz 222 lb 0.1 oz  Weight (kg) 102.9 kg 100.9 kg 100.7 kg      Telemetry    Sinus bradycardia sinus rhythm rate 70-70- Personally Reviewed  ECG    No new tracings- Personally Reviewed  Physical Exam   GEN: No acute distress.  Resting with eyes closed. Neck: No JVD Cardiac: RRR, no  murmurs, rubs, or gallops.  Respiratory: Clear to auscultation bilaterally. GI: Soft, nontender, non-distended  MS: No edema; No deformity. Neuro:  Nonfocal  Psych: Normal affect   Labs    High Sensitivity Troponin:   Recent Labs  Lab 09/17/22 0844 09/17/22 1120  TROPONINIHS 58* 48*     Chemistry Recent Labs  Lab 09/16/22 1849 09/17/22 0504 09/18/22 0115 09/19/22 0622 09/20/22 0511  NA 138   < > 137 137 137  K 2.9*   < > 3.6 3.8 4.1  CL 107   < > 101 102 102  CO2 24   < > 26 29 29   GLUCOSE 72   < > 109* 96 98  BUN 27*   < > 20 23 24*  CREATININE 0.77   < > 1.00 0.98 0.91  CALCIUM 7.1*   < > 8.4* 8.5* 8.7*  MG 1.9  --  1.8  --  2.0  PROT 5.4*  --   --   --   --   ALBUMIN 3.0*  --   --   --   --  AST 17  --   --   --   --   ALT 14  --   --   --   --   ALKPHOS 72  --   --   --   --   BILITOT 0.5  --   --   --   --   GFRNONAA >60   < > 58* 59* >60  ANIONGAP 7   < > 10 6 6    < > = values in this interval not displayed.    Lipids No results for input(s): "CHOL", "TRIG", "HDL", "LABVLDL", "LDLCALC", "CHOLHDL" in the last 168 hours.  Hematology Recent Labs  Lab 09/18/22 0115 09/19/22 0218 09/20/22 0511  WBC 8.3 8.1 8.5  RBC 4.24 4.13 4.12  HGB 14.2 14.0 13.8  HCT 42.1 41.2 40.9  MCV 99.3 99.8 99.3  MCH 33.5 33.9 33.5  MCHC 33.7 34.0 33.7  RDW 13.5 13.5 13.4  PLT 174 161 176   Thyroid  Recent Labs  Lab 09/20/22 2347 09/21/22 0709  TSH 6.597*  --   FREET4  --  1.03    BNP Recent Labs  Lab 09/16/22 1644  BNP 454.5*    DDimer No results for input(s): "DDIMER" in the last 168 hours.   Radiology    No results found.  Cardiac Studies   Echo 09/18/22 1. Left ventricular ejection fraction, by estimation, is 55 to 60%. The  left ventricle has normal function. Left ventricular endocardial border  not optimally defined to evaluate regional wall motion. There is moderate  left ventricular hypertrophy. Left  ventricular diastolic parameters are  indeterminate.   2. Right ventricular systolic function is low normal. The right  ventricular size is normal. Mildly increased right ventricular wall  thickness. Tricuspid regurgitation signal is inadequate for assessing PA  pressure.   3. The mitral valve is normal in structure. Trivial mitral valve  regurgitation.   4. The aortic valve has an indeterminant number of cusps. There is mild  calcification of the aortic valve. There is mild thickening of the aortic  valve. Aortic valve regurgitation is not visualized. No aortic stenosis is  present.   Patient Profile     77 y.o. female with a past medical history of anxiety, depression, hypertension, dyslipidemia, hypothyroidism, overactive bladder, OSA, OA has been seen and evaluated for palpitations  Assessment & Plan    Atrial flutter with RVR -Currently maintaining sinus  -Continued on apixaban 5 mg twice daily for CHA2DS2-VASc score of at least 5 for stroke prophylaxis -Continued on amiodarone 400 mg IV twice daily x 4 more days, then reduce to 200 mg twice daily x 7 days and then 200 mg daily -continue telemetry monitoring  Elevated BNP -BNP 454 -Echocardiogram completed with LVEF 55 to 60%, suspect diastolic dysfunction -Continued on furosemide 40 mg daily -Lower extremity edema improving -Inaccurate I's and O's recorded  Hypokalemia -Serum potassium 4.1 -Previously up at 2.9 -Daily BMP -Monitor/trend/replete electrolytes as needed -Recommend keeping potassium greater than 4 less than 5  Hypothyroidism -Continue on Synthroid -Management per IM  Dyslipidemia -LDL 109 -Continue pravastatin 40 mg daily  Hypertension -Blood pressure 137/88 -Continued on Toprol-XL 100 mg daily -Vital signs per unit protocol  Obstructive sleep apnea -Continue with nightly CPAP     For questions or updates, please contact  [facility] Please consult [email or web address] for contact info under        Signed, Shereese Bonnie,  NP  09/21/2022, 12:05 PM

## 2022-09-21 NOTE — Care Management Important Message (Signed)
Important Message  Patient Details  Name: Robin Espinoza MRN: [id number] Date of Birth: 1945/12/16   Medicare Important Message Given:  Yes     [name] 09/21/2022, 12:17 PM

## 2022-09-21 NOTE — TOC Progression Note (Addendum)
Transition of Care [facility]) - Progression Note    Patient Details  Name: Robin Espinoza MRN: [id number] Date of Birth: 1945/06/02  [facility]) CM/SW Contact  Margarito Liner, LCSW Phone Number: 09/21/2022, 10:59 AM  Clinical Narrative:   [facility] as soon as tomorrow. Sent secure chat to MD to confirm she should be stable by then. Patient has accepted bed offer.  11:03 am: CSW started insurance authorization.  Expected Discharge Plan: Skilled Nursing Facility Barriers to Discharge: Continued Medical Work up  Expected Discharge Plan and Services       Living arrangements for the past 2 months: Single Family Home                                       Social Determinants of Health (SDOH) Interventions SDOH Screenings   Food Insecurity: No Food Insecurity (09/17/2022)  Housing: Low Risk  (09/17/2022)  Transportation Needs: No Transportation Needs (09/17/2022)  Utilities: Not At Risk (09/17/2022)  Alcohol Screen: Low Risk  (09/12/2021)  Depression (PHQ2-9): Medium Risk (09/12/2021)  Financial Resource Strain: Low Risk  (04/04/2020)  Physical Activity: Inactive (04/04/2020)  Social Connections: Socially Integrated (04/04/2020)  Stress: No Stress Concern Present (04/04/2020)  Tobacco Use: Medium Risk (09/17/2022)    Readmission Risk Interventions     No data to display

## 2022-09-22 DIAGNOSIS — I4891 Unspecified atrial fibrillation: Secondary | ICD-10-CM | POA: Diagnosis not present

## 2022-09-22 NOTE — Progress Notes (Signed)
Progress Note   Patient: Robin Espinoza KGM:[id number] DOB: [birth year] DOA: 09/16/2022     6 DOS: the patient was seen and examined on 09/22/2022      Subjective:  Seen and examined this morning Still continues to await placement Denies worsening shortness of breath cough nausea vomiting abdominal pain     Brief hospital course:   From HPI "Robin Espinoza is a 77 y.o. Caucasian female with medical history significant for anxiety, depression, hypertension, dyslipidemia, hypothyroidism, overactive bladder, obstructive sleep apnea and osteoarthritis, who presented to the emergency room with acute onset of fall while using her rollator today.  She stated that she felt lightheaded and has been feeling fatigued lately.  She hit the right side of her face with subsequent forehead laceration as well as had a right hand skin tear.  She denies any loss of consciousness.    ED Course: When she came to the ER BP was 116/91 with a heart rate of 143 and  respiratory rate of 20, temperature 98.1 and pulse currently 96% on room air.  Labs revealed hypokalemia of 2.9, BUN of 27 and albumin 3 with total protein of 5.4.  BNP was 454.5.  CBC showed hemoconcentration and macrocytosis.  TSH was 4.76 and free T4 1.22 both minimally elevated. EKG as reviewed by me : EKG showed atrial fibrillation with rapid ventricular response of 125 with low voltage QRS. Imaging: Portable chest x-ray showed no acute cardiopulmonary disease. Noncontrasted head CT scan showed no acute intracranial abnormalities. C-spine CT showed no acute findings.  There were multilevel severe degenerative changes of the spine with associated multilevel posterior disc osteophyte complex formations and associated osseous neuroforaminal stenosis at both C3-C4 and C4-C5 levels. Lumbar spine CT without contrast revealed the following: 1. No acute displaced fracture or traumatic listhesis of the lumbar spine. 2. Multilevel posterior disc osteophyte  complex formation with associated left L4-L5 as well as T11-T12 and T10-T11 severe osseous neural foraminal stenosis. Suggestion of at least moderate to severe osseous neural foraminal stenosis at the L1-L2 and T10-T11. 3.  Aortic Atherosclerosis (ICD10-I70.0). 4. 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 Recommend follow-up ultrasound every 3 years. This recommendation follows ACR consensus guidelines. Patient received IV diltiazem bolus followed by drip.  Cardiology was consulted".   Assessment and plan  Atrial fibrillation with rapid ventricular response,  Currently rate controlled S/p diltiazem infusion Cardiologist on board and have switched to p.o. diltiazem as well as metoprolol cardiology switched to p.o. diltiazem and Toprol,  Currently on oral amiodarone 400 mg p.o. BID x 7day, 200 mg po BID x day and the 200 mg pod TTE LVEF 55 to 60%, moderate LV hypertrophy.  Cardiologist on board and case discussed Continue Eliquis   Elevated brain natriuretic peptide (BNP) level No prior history of heart failure.  TTE as above Continue oral Lasix as recommended by cardiologist   Hypokalemia, Resolved  Continue potassium repletion and monitoring   Hypothyroidism Continue levothyroxine   Depression, recurrent and anxiety - We will continue her Wellbutrin XL, Effexor XR and Cymbalta. 6/30 it seems patient is developing extraparametal symptoms, parkinsonism Consulted psych for medication optimization and decrease polypharmacy, patient is feeling weak and tired and fatigued and sleepy most of the time Outpatient follow-up with psych   GERD without esophagitis Continue PPI therapy.   Dyslipidemia Continue statin therapy.   Essential hypertension - Home losartan and HCTZ was discontinued by cardiology to make a room for rate controlling medications.  Currently on diltiazem, metoprolol  and Lasix     Vitamin B12 deficiency: B 12 level 248 goal >400, started vitamin B12 1000  mcg IM injection daily during hospital stay, followed by oral supplement.  Follow-up PCP to repeat vitamin B12 level after 3 to 6 months.   Body mass index is 37.02 kg/m.  Interventions: Counseled on weight loss with diet and exercise when medically stable   Diet: Heart healthy DVT Prophylaxis: Therapeutic Anticoagulation with Eliquis     Advance goals of care discussion: DNR   Family Communication: family was present at bedside, at the time of interview.  The pt provided permission to discuss medical plan with the family. Opportunity was given to ask question and all questions were answered satisfactorily.        Physical Exam: General: NAD, lying comfortably Appear in no distress, affect appropriate Eyes: PERRLA ENT: Oral Mucosa Clear, moist  Neck: no JVD,  Cardiovascular: Sinus bradycardia, S1-S2 audible, no Murmur,  Respiratory: good respiratory effort, Bilateral Air entry equal and Decreased, no Crackles, no wheezes Abdomen: Bowel Sound present, Soft and no tenderness,  Skin: no rashes Extremities: no Pedal edema, no calf tenderness Neurologic: without any new focal findings Gait not checked due to patient safety concerns     Data reviewed: I have reviewed notes by nurses, patient's vitals, as well as notes by consulting cardiologist   Vitals:   09/22/22 0443 09/22/22 0500 09/22/22 0813 09/22/22 1242  BP: 135/72  136/83 (!) 122/43  Pulse: 64  63 60  Resp: 16  16 16   Temp: 97.7 F (36.5 C)  97.8 F (36.6 C) (!) 97.5 F (36.4 C)  TempSrc: Oral   Oral  SpO2: 97%  100% 96%  Weight:  100.5 kg    Height:         Author: Loyce Dys, MD 09/22/2022 6:15 PM  For on call review [email or web address].

## 2022-09-22 NOTE — TOC Progression Note (Addendum)
Transition of Care [facility]) - Progression Note    Patient Details  Name: Robin Espinoza MRN: [id number] Date of Birth: 1945/03/24  [facility]) CM/SW Contact  Margarito Liner, LCSW Phone Number: 09/22/2022, 2:49 PM  Clinical Narrative: [facility] approved for SNF: [phone]. EMS auth still pending. [facility] admissions nurse is gone for the day so will have to postpone discharge to tomorrow morning.    3:56 pm: Updated patient. Even if insurance does not approve EMS transport, she still wants to transport to the facility that way.   Expected Discharge Plan: Skilled Nursing Facility Barriers to Discharge: Continued Medical Work up  Expected Discharge Plan and Services       Living arrangements for the past 2 months: Single Family Home                                       Social Determinants of Health (SDOH) Interventions SDOH Screenings   Food Insecurity: No Food Insecurity (09/17/2022)  Housing: Low Risk  (09/17/2022)  Transportation Needs: No Transportation Needs (09/17/2022)  Utilities: Not At Risk (09/17/2022)  Alcohol Screen: Low Risk  (09/12/2021)  Depression (PHQ2-9): Medium Risk (09/12/2021)  Financial Resource Strain: Low Risk  (04/04/2020)  Physical Activity: Inactive (04/04/2020)  Social Connections: Socially Integrated (04/04/2020)  Stress: No Stress Concern Present (04/04/2020)  Tobacco Use: Medium Risk (09/17/2022)    Readmission Risk Interventions     No data to display

## 2022-09-22 NOTE — Progress Notes (Signed)
Rounding Note    Patient Name: Robin Espinoza Date of Encounter: 09/22/2022  [facility] Cardiologist: None   Subjective   Patient seen on a.m. rounds.  Denies any chest pain or shortness of breath or palpitations.  Continues to remain in sinus rhythm on telemetry monitoring.  Inpatient Medications    Scheduled Meds:  amiodarone  400 mg Oral BID   apixaban  5 mg Oral BID   buPROPion  300 mg Oral Daily   vitamin D3  1,000 Units Oral Daily   cyanocobalamin  1,000 mcg Intramuscular Daily   Followed by   Melene Muller ON 09/25/2022] vitamin B-12  1,000 mcg Oral Daily   furosemide  40 mg Oral Daily   gabapentin  200 mg Oral BID   [START ON 09/28/2022] levothyroxine  88 mcg Oral Q0600   metoprolol succinate  50 mg Oral Daily   mirabegron ER  25 mg Oral Daily   pantoprazole  40 mg Oral Daily   pravastatin  40 mg Oral q1800   traZODone  100 mg Oral QHS   Continuous Infusions:  PRN Meds: acetaminophen **OR** acetaminophen, magnesium hydroxide, ondansetron **OR** ondansetron (ZOFRAN) IV, polyvinyl alcohol, senna, traMADol   Vital Signs    Vitals:   09/22/22 0443 09/22/22 0500 09/22/22 0813 09/22/22 1242  BP: 135/72  136/83 (!) 122/43  Pulse: 64  63 60  Resp: 16  16 16   Temp: 97.7 F (36.5 C)  97.8 F (36.6 C) (!) 97.5 F (36.4 C)  TempSrc: Oral   Oral  SpO2: 97%  100% 96%  Weight:  100.5 kg    Height:        Intake/Output Summary (Last 24 hours) at 09/22/2022 1631 Last data filed at 09/22/2022 1500 Gross per 24 hour  Intake 240 ml  Output 1700 ml  Net -1460 ml      09/22/2022    5:00 AM 09/21/2022    5:41 AM 09/20/2022    4:26 AM  Last 3 Weights  Weight (lbs) 221 lb 9 oz 226 lb 13.7 oz 222 lb 7.1 oz  Weight (kg) 100.5 kg 102.9 kg 100.9 kg      Telemetry    Sinus bradycardia to sinus rhythm rate of 50-70- Personally Reviewed  ECG    No new tracings- Personally Reviewed  Physical Exam   GEN: No acute distress.   Neck: No JVD Cardiac: RRR, no murmurs,  rubs, or gallops.  Respiratory: Clear to auscultation bilaterally.  Respirations are unlabored at rest on room air GI: Soft, nontender, non-distended  MS: No edema; No deformity. Neuro:  Nonfocal  Psych: Normal affect   Labs    High Sensitivity Troponin:   Recent Labs  Lab 09/17/22 0844 09/17/22 1120  TROPONINIHS 58* 48*     Chemistry Recent Labs  Lab 09/16/22 1849 09/17/22 0504 09/18/22 0115 09/19/22 0622 09/20/22 0511  NA 138   < > 137 137 137  K 2.9*   < > 3.6 3.8 4.1  CL 107   < > 101 102 102  CO2 24   < > 26 29 29   GLUCOSE 72   < > 109* 96 98  BUN 27*   < > 20 23 24*  CREATININE 0.77   < > 1.00 0.98 0.91  CALCIUM 7.1*   < > 8.4* 8.5* 8.7*  MG 1.9  --  1.8  --  2.0  PROT 5.4*  --   --   --   --  ALBUMIN 3.0*  --   --   --   --   AST 17  --   --   --   --   ALT 14  --   --   --   --   ALKPHOS 72  --   --   --   --   BILITOT 0.5  --   --   --   --   GFRNONAA >60   < > 58* 59* >60  ANIONGAP 7   < > 10 6 6    < > = values in this interval not displayed.    Lipids No results for input(s): "CHOL", "TRIG", "HDL", "LABVLDL", "LDLCALC", "CHOLHDL" in the last 168 hours.  Hematology Recent Labs  Lab 09/18/22 0115 09/19/22 0218 09/20/22 0511  WBC 8.3 8.1 8.5  RBC 4.24 4.13 4.12  HGB 14.2 14.0 13.8  HCT 42.1 41.2 40.9  MCV 99.3 99.8 99.3  MCH 33.5 33.9 33.5  MCHC 33.7 34.0 33.7  RDW 13.5 13.5 13.4  PLT 174 161 176   Thyroid  Recent Labs  Lab 09/20/22 2347 09/21/22 0709  TSH 6.597*  --   FREET4  --  1.03    BNP Recent Labs  Lab 09/16/22 1644  BNP 454.5*    DDimer No results for input(s): "DDIMER" in the last 168 hours.   Radiology    No results found.  Cardiac Studies   Echo 09/18/22 1. Left ventricular ejection fraction, by estimation, is 55 to 60%. The  left ventricle has normal function. Left ventricular endocardial border  not optimally defined to evaluate regional wall motion. There is moderate  left ventricular hypertrophy. Left   ventricular diastolic parameters are indeterminate.   2. Right ventricular systolic function is low normal. The right  ventricular size is normal. Mildly increased right ventricular wall  thickness. Tricuspid regurgitation signal is inadequate for assessing PA  pressure.   3. The mitral valve is normal in structure. Trivial mitral valve  regurgitation.   4. The aortic valve has an indeterminant number of cusps. There is mild  calcification of the aortic valve. There is mild thickening of the aortic  valve. Aortic valve regurgitation is not visualized. No aortic stenosis is  present.   Patient Profile     77 y.o. female with past medical history of anxiety, depression, hypertension, dyslipidemia, hypothyroidism, overactive bladder, OSA, and OA who has been seen and evaluated for palpitations and found to be in atrial flutter with RVR.  Assessment & Plan    Atrial flutter with RVR -Currently maintaining sinus -Continued on apixaban 5 mg twice daily for CHA2DS2-VASc score of at least 5 for stroke prophylaxis -Continued on amiodarone and Toprol-XL -Continue with telemetry monitoring  Elevated BNP -BNP 454 -Echocardiogram revealed LVEF 55 to 60% suspected systolic dysfunction -Continued on furosemide 40 mg daily -Lower extremity edema continues to improve -Maintaining oxygen saturation on room air without complaints of shortness of breath -Inaccurate I's and O's have been recorded --1.4 L output in the last 24 hours  Hypokalemia -Last BMP done on 6/30 -Labs ordered for a.m. to reevaluate  Hypothyroidism -Continued on levothyroxine  Dyslipidemia -Continued on pravastatin  Hypertension -Blood pressure 136/83 -Continued on amiodarone, furosemide, and Toprol-XL -Vital signs per unit protocol  Obstructive sleep apnea -Recommend to continue with nightly CPAP     For questions or updates, please contact [facility] Please consult [email or web address] for contact info  under        Signed, Tawnie Ehresman  Madina Galati, NP  09/22/2022, 4:31 PM

## 2022-09-23 ENCOUNTER — Telehealth: Payer: Self-pay | Admitting: *Deleted

## 2022-09-23 DIAGNOSIS — Z23 Encounter for immunization: Secondary | ICD-10-CM | POA: Diagnosis present

## 2022-09-23 DIAGNOSIS — E538 Deficiency of other specified B group vitamins: Secondary | ICD-10-CM | POA: Diagnosis not present

## 2022-09-23 DIAGNOSIS — R296 Repeated falls: Secondary | ICD-10-CM | POA: Diagnosis not present

## 2022-09-23 DIAGNOSIS — K219 Gastro-esophageal reflux disease without esophagitis: Secondary | ICD-10-CM | POA: Diagnosis not present

## 2022-09-23 DIAGNOSIS — G4733 Obstructive sleep apnea (adult) (pediatric): Secondary | ICD-10-CM | POA: Diagnosis not present

## 2022-09-23 DIAGNOSIS — I509 Heart failure, unspecified: Secondary | ICD-10-CM | POA: Diagnosis not present

## 2022-09-23 DIAGNOSIS — G629 Polyneuropathy, unspecified: Secondary | ICD-10-CM | POA: Diagnosis not present

## 2022-09-23 DIAGNOSIS — R2689 Other abnormalities of gait and mobility: Secondary | ICD-10-CM | POA: Diagnosis not present

## 2022-09-23 DIAGNOSIS — F5101 Primary insomnia: Secondary | ICD-10-CM | POA: Diagnosis not present

## 2022-09-23 DIAGNOSIS — Z7401 Bed confinement status: Secondary | ICD-10-CM | POA: Diagnosis not present

## 2022-09-23 DIAGNOSIS — E039 Hypothyroidism, unspecified: Secondary | ICD-10-CM | POA: Diagnosis not present

## 2022-09-23 DIAGNOSIS — N3281 Overactive bladder: Secondary | ICD-10-CM | POA: Diagnosis not present

## 2022-09-23 DIAGNOSIS — F419 Anxiety disorder, unspecified: Secondary | ICD-10-CM | POA: Diagnosis not present

## 2022-09-23 DIAGNOSIS — R29898 Other symptoms and signs involving the musculoskeletal system: Secondary | ICD-10-CM | POA: Diagnosis not present

## 2022-09-23 DIAGNOSIS — R278 Other lack of coordination: Secondary | ICD-10-CM | POA: Diagnosis not present

## 2022-09-23 DIAGNOSIS — G4700 Insomnia, unspecified: Secondary | ICD-10-CM | POA: Diagnosis not present

## 2022-09-23 DIAGNOSIS — E785 Hyperlipidemia, unspecified: Secondary | ICD-10-CM | POA: Diagnosis not present

## 2022-09-23 DIAGNOSIS — M6281 Muscle weakness (generalized): Secondary | ICD-10-CM | POA: Diagnosis not present

## 2022-09-23 DIAGNOSIS — H6123 Impacted cerumen, bilateral: Secondary | ICD-10-CM | POA: Diagnosis not present

## 2022-09-23 DIAGNOSIS — F339 Major depressive disorder, recurrent, unspecified: Secondary | ICD-10-CM | POA: Diagnosis not present

## 2022-09-23 DIAGNOSIS — I4891 Unspecified atrial fibrillation: Secondary | ICD-10-CM | POA: Diagnosis not present

## 2022-09-23 DIAGNOSIS — F32A Depression, unspecified: Secondary | ICD-10-CM | POA: Diagnosis not present

## 2022-09-23 DIAGNOSIS — Z9181 History of falling: Secondary | ICD-10-CM | POA: Diagnosis not present

## 2022-09-23 DIAGNOSIS — I10 Essential (primary) hypertension: Secondary | ICD-10-CM | POA: Diagnosis not present

## 2022-09-23 DIAGNOSIS — E559 Vitamin D deficiency, unspecified: Secondary | ICD-10-CM | POA: Diagnosis not present

## 2022-09-23 DIAGNOSIS — W010XXA Fall on same level from slipping, tripping and stumbling without subsequent striking against object, initial encounter: Secondary | ICD-10-CM | POA: Diagnosis not present

## 2022-09-23 DIAGNOSIS — M1990 Unspecified osteoarthritis, unspecified site: Secondary | ICD-10-CM | POA: Diagnosis not present

## 2022-09-23 LAB — BASIC METABOLIC PANEL
Anion gap: 10 (ref 5–15)
BUN: 21 mg/dL (ref 8–23)
CO2: 27 mmol/L (ref 22–32)
Calcium: 8.4 mg/dL — ABNORMAL LOW (ref 8.9–10.3)
Chloride: 98 mmol/L (ref 98–111)
Creatinine, Ser: 0.73 mg/dL (ref 0.44–1.00)
GFR, Estimated: >60 mL/min (ref >60)
Glucose, Bld: 100 mg/dL — ABNORMAL HIGH (ref 70–99)
Potassium: 3.9 mmol/L (ref 3.5–5.1)
Sodium: 135 mmol/L (ref 135–145)

## 2022-09-23 MED ORDER — CYANOCOBALAMIN 1000 MCG PO TABS: 1000.0000 ug | ORAL_TABLET | Freq: Every day | ORAL | 0 refills | Status: DC

## 2022-09-23 MED ORDER — GABAPENTIN 100 MG PO CAPS: 200.0000 mg | ORAL_CAPSULE | Freq: Two times a day (BID) | ORAL | 0 refills | Status: DC

## 2022-09-23 MED ORDER — METOPROLOL SUCCINATE ER 50 MG PO TB24: 50.0000 mg | ORAL_TABLET | Freq: Every day | ORAL | 0 refills | Status: DC

## 2022-09-23 MED ORDER — AMIODARONE HCL 200 MG PO TABS: 200.0000 mg | ORAL_TABLET | Freq: Every day | ORAL | 0 refills | Status: DC

## 2022-09-23 MED ORDER — BUPROPION HCL ER (XL) 300 MG PO TB24: 300.0000 mg | ORAL_TABLET | Freq: Every day | ORAL | 0 refills | Status: DC

## 2022-09-23 MED ORDER — LACTULOSE ENEMA
300.0000 mL | Freq: Once | ORAL | Status: AC
  Administered 2022-09-23: 300 mL via RECTAL
  Filled 2022-09-23: qty 300

## 2022-09-23 MED ORDER — APIXABAN 5 MG PO TABS: 5.0000 mg | ORAL_TABLET | Freq: Two times a day (BID) | ORAL | 0 refills | Status: DC

## 2022-09-23 MED ORDER — FUROSEMIDE 40 MG PO TABS: 40.0000 mg | ORAL_TABLET | Freq: Every day | ORAL | 0 refills | Status: DC

## 2022-09-23 MED ORDER — AMIODARONE HCL 200 MG PO TABS: 200.0000 mg | ORAL_TABLET | Freq: Every day | ORAL | Status: DC

## 2022-09-23 MED ORDER — AMIODARONE HCL 400 MG PO TABS: 200.0000 mg | ORAL_TABLET | Freq: Two times a day (BID) | ORAL | 0 refills | Status: DC

## 2022-09-23 NOTE — Progress Notes (Signed)
Report called to nurse Merdis Delay at [facility].

## 2022-09-23 NOTE — Discharge Summary (Signed)
Physician Discharge Summary   Patient: Robin Espinoza MRN: [id number] DOB: 12-11-1945  Admit date:     09/16/2022  Discharge date: 09/23/22  Discharge Physician: Loyce Dys   PCP: Lauro Regulus, MD     Discharge Diagnoses: Atrial fibrillation with rapid ventricular response,  Elevated brain natriuretic peptide (BNP) level Hypokalemia, Resolved  Hypothyroidism  Depression, recurrent and anxiety GERD without esophagitis Dyslipidemia Essential hypertension Vitamin B12 deficiency:   Hospital Course: LORRINE WILLOCKS is a 77 y.o. Caucasian female with medical history significant for anxiety, depression, hypertension, dyslipidemia, hypothyroidism, overactive bladder, obstructive sleep apnea and osteoarthritis, who presented to the emergency room with acute onset of fall while using her rollator.  She stated that she felt lightheaded and has been feeling fatigued lately.  She hit the right side of her face with subsequent forehead laceration as well as had a right hand skin tear.  She denies any loss of consciousness.    ED Course: When she came to the ER BP was 116/91 with a heart rate of 143 and  respiratory rate of 20, temperature 98.1 and pulse currently 96% on room air.  Labs revealed hypokalemia of 2.9, BUN of 27 and albumin 3 with total protein of 5.4.  BNP was 454.5.  CBC showed hemoconcentration and macrocytosis.  TSH was 4.76 and free T4 1.22 both minimally elevated. EKG: EKG showed atrial fibrillation with rapid ventricular response of 125 with low voltage QRS. Imaging: Portable chest x-ray showed no acute cardiopulmonary disease. Noncontrasted head CT scan showed no acute intracranial abnormalities. C-spine CT showed no acute findings.  There were multilevel severe degenerative changes of the spine with associated multilevel posterior disc osteophyte complex formations and associated osseous neuroforaminal stenosis at both C3-C4 and C4-C5 levels. Lumbar spine CT without  contrast revealed the following: 1. No acute displaced fracture or traumatic listhesis of the lumbar spine. 2. Multilevel posterior disc osteophyte complex formation with associated left L4-L5 as well as T11-T12 and T10-T11 severe osseous neural foraminal stenosis. Suggestion of at least moderate to severe osseous neural foraminal stenosis at the L1-L2 and T10-T11. 3.  Aortic Atherosclerosis (ICD10-I70.0). 4. 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 Recommend follow-up ultrasound every 3 years. This recommendation follows ACR consensus guidelines. Patient received IV diltiazem bolus followed by drip.  Cardiology was consulted. Patient was later switched to IV amiodarone and currently on oral amiodarone. She completed 7 days of 400 mg of amiodarone twice daily and has now been transitioned to 200 mg p.o. twice daily for 7 days and then 20 mg p.o. twice daily thereafter. Echocardiogram showed ejection fraction 55 to 60% with moderate LV hypertrophy.  Patient was also initiated on Eliquis in the setting of new onset A-fib.  Now been cleared for discharge to rehab and to follow-up with primary care physician.   Consultants: None Procedures performed: None Disposition: Skilled nursing facility Diet recommendation:  Discharge Diet Orders (From admission, onward)     Start     Ordered   09/23/22 0000  Diet - low sodium heart healthy        09/23/22 1036           Cardiac diet DISCHARGE MEDICATION: Allergies as of 09/23/2022       Reactions   Oxycodone Other (See Comments)   hallucinated   Amlodipine Swelling   Patient has been tolerating low dose        Medication List     STOP taking these medications    amLODipine  5 MG tablet Commonly known as: NORVASC   busPIRone 15 MG tablet Commonly known as: BUSPAR   celecoxib 200 MG capsule Commonly known as: CELEBREX   DULoxetine 60 MG capsule Commonly known as: CYMBALTA   losartan-hydrochlorothiazide 50-12.5 MG  tablet Commonly known as: HYZAAR   traMADol 50 MG tablet Commonly known as: ULTRAM   venlafaxine XR 75 MG 24 hr capsule Commonly known as: EFFEXOR-XR   Vitamin D (Ergocalciferol) 1.25 MG (50000 UNIT) Caps capsule Commonly known as: DRISDOL       TAKE these medications    acetaminophen 650 MG CR tablet Commonly known as: TYLENOL Take 1,300 mg by mouth every 8 (eight) hours as needed for pain.   amiodarone 400 MG tablet Commonly known as: PACERONE Take 0.5 tablets (200 mg total) by mouth 2 (two) times daily for 7 days. Start taking on: September 24, 2022   amiodarone 200 MG tablet Commonly known as: PACERONE Take 1 tablet (200 mg total) by mouth daily. Start taking on: October 01, 2022   apixaban 5 MG Tabs tablet Commonly known as: ELIQUIS Take 1 tablet (5 mg total) by mouth 2 (two) times daily.   buPROPion 300 MG 24 hr tablet Commonly known as: WELLBUTRIN XL Take 300 mg by mouth daily. What changed: Another medication with the same name was changed. Make sure you understand how and when to take each.   buPROPion 300 MG 24 hr tablet Commonly known as: WELLBUTRIN XL Take 1 tablet (300 mg total) by mouth daily. Start taking on: September 24, 2022 What changed:  medication strength how much to take when to take this   cyanocobalamin 1000 MCG tablet Take 1 tablet (1,000 mcg total) by mouth daily. Start taking on: September 25, 2022   furosemide 40 MG tablet Commonly known as: LASIX Take 1 tablet (40 mg total) by mouth daily. Start taking on: September 24, 2022   gabapentin 100 MG capsule Commonly known as: NEURONTIN Take 2 capsules (200 mg total) by mouth 2 (two) times daily. What changed: when to take this   levothyroxine 88 MCG tablet Commonly known as: SYNTHROID Take 1 tablet (88 mcg total) by mouth daily.   lovastatin 40 MG tablet Commonly known as: MEVACOR TAKE 1 TABLET BY MOUTH EVERYDAY AT BEDTIME What changed: See the new instructions.   metoprolol succinate 50 MG 24 hr  tablet Commonly known as: TOPROL-XL Take 1 tablet (50 mg total) by mouth daily. Take with or immediately following a meal. Start taking on: September 24, 2022 What changed:  medication strength how much to take   mirabegron ER 25 MG Tb24 tablet Commonly known as: MYRBETRIQ Take 1 tablet (25 mg total) by mouth daily.   NON FORMULARY CPAP   nystatin cream Commonly known as: MYCOSTATIN Apply 1 Application topically 2 (two) times daily.   omeprazole 20 MG capsule Commonly known as: PRILOSEC TAKE 1 CAPSULE BY MOUTH EVERY DAY What changed:  how much to take how to take this when to take this   senna 8.6 MG tablet Commonly known as: SENOKOT Take 2 tablets (17.2 mg total) by mouth at bedtime.   THERATEARS OP Place 1 drop into both eyes daily as needed (dry eyes).   traZODone 50 MG tablet Commonly known as: DESYREL Take 2 tablets (100 mg total) by mouth daily.   vitamin D3 25 MCG tablet Commonly known as: CHOLECALCIFEROL Take 1,000 Units by mouth daily.        Contact information for after-discharge care  Destination     [facility] .   Service: Skilled Nursing Contact information: [address] [place] [address] [phone]                    Discharge Exam: Ceasar Mons Weights   09/20/22 0426 09/21/22 0541 09/22/22 0500  Weight: 100.9 kg 102.9 kg 100.5 kg   General: NAD, lying comfortably Appear in no distress, affect appropriate Eyes: PERRLA ENT: Oral Mucosa Clear, moist  Neck: no JVD,  Cardiovascular: Sinus bradycardia, S1-S2 audible, no Murmur,  Respiratory: good respiratory effort, Bilateral Air entry equal and Decreased, no Crackles, no wheezes Abdomen: Bowel Sound present, Soft and no tenderness,  Skin: no rashes Extremities: no Pedal edema, no calf tenderness Neurologic: without any new focal findings Gait not checked due to patient safety concerns  Condition at discharge: good   Discharge time  spent: 35 MINUTES  Signed: Loyce Dys, MD [facility] 09/23/2022

## 2022-09-23 NOTE — Telephone Encounter (Signed)
Noted- the patient is scheduled to see Cadence, PA on 10/06/22.

## 2022-09-23 NOTE — Care Management Important Message (Signed)
Important Message  Patient Details  Name: Robin Espinoza MRN: [id number] Date of Birth: 1945-08-03   Medicare Important Message Given:  Yes     [name] 09/23/2022, 11:28 AM

## 2022-09-23 NOTE — TOC Transition Note (Addendum)
Transition of Care [facility]) - CM/SW Discharge Note   Patient Details  Name: Robin Espinoza MRN: [id number] Date of Birth: [birth year]  [facility] CM/SW Contact:  Truddie Hidden, RN Phone Number: 09/23/2022, 12:16 PM   Clinical Narrative:    Attempt to reach Michel Santee, Admissions Coordinator for [facility] regarding details for admission to facility today.  No answer. Left a message.   1:00pm Spoke with Sue Lush from [facility]. Patient discharge summary received.   2:20pm Spoke with patient to advised insurance did not approve ambulance. She was advised [facility]  does not [employer] pl[employer]. Patient was advised of options to have family transport her or to have EMS transport her and be billed. Patient chose EMS transport. Patient requested an update on the timeframe from EMS when arranged.   2:25pm EMS arranged. Patient advised of her room number at [facility]. She was also updated she was 6th on the EMS list.  TOC signing off.    TOC sign        Barriers to Discharge: Continued Medical Work up   Patient Goals and CMS Choice CMS Medicare.gov Compare Post Acute Care list provided to:: Patient Choice offered to / list presented to : Patient  Discharge Placement                         Discharge Plan and Services Additional resources added to the After Visit Summary for                                       Social Determinants of Health (SDOH) Interventions SDOH Screenings   Food Insecurity: No Food Insecurity (09/17/2022)  Housing: Low Risk  (09/17/2022)  Transportation Needs: No Transportation Needs (09/17/2022)  Utilities: Not At Risk (09/17/2022)  Alcohol Screen: Low Risk  (09/12/2021)  Depression (PHQ2-9): Medium Risk (09/12/2021)  Financial Resource Strain: Low Risk  (04/04/2020)  Physical Activity: Inactive (04/04/2020)  Social Connections: Socially Integrated (04/04/2020)  Stress: No Stress Concern Present (04/04/2020)  Tobacco Use:  Medium Risk (09/17/2022)     Readmission Risk Interventions     No data to display

## 2022-09-23 NOTE — Progress Notes (Signed)
Patient left unit alert by stretcher with [facility] going to [facility].

## 2022-09-23 NOTE — Telephone Encounter (Signed)
-----   Message from Cadence David Stall, PA-C sent at 09/22/2022  6:28 PM EDT ----- Regarding: RE: hosp follow-up No  ----- Message ----- From: Jefferey Pica, RN Sent: 09/21/2022   9:05 AM EDT To: Cadence David Stall, PA-C; [facility] Triage Subject: RE: hosp follow-up                             Is this a TOC call?  ----- Message ----- From: Marianne Sofia, PA-C Sent: 09/20/2022   7:37 AM EDT To: [facility] Triage; [facility] Scheduling Subject: hosp follow-up                                 Pt needs 2 week hospital follow-up.

## 2022-09-25 ENCOUNTER — Encounter: Payer: Self-pay | Admitting: Student

## 2022-09-25 ENCOUNTER — Non-Acute Institutional Stay (SKILLED_NURSING_FACILITY): Payer: PPO | Admitting: Student

## 2022-09-25 DIAGNOSIS — F339 Major depressive disorder, recurrent, unspecified: Secondary | ICD-10-CM

## 2022-09-25 DIAGNOSIS — G4733 Obstructive sleep apnea (adult) (pediatric): Secondary | ICD-10-CM

## 2022-09-25 DIAGNOSIS — F419 Anxiety disorder, unspecified: Secondary | ICD-10-CM

## 2022-09-25 DIAGNOSIS — I4891 Unspecified atrial fibrillation: Secondary | ICD-10-CM | POA: Diagnosis not present

## 2022-09-25 DIAGNOSIS — K219 Gastro-esophageal reflux disease without esophagitis: Secondary | ICD-10-CM | POA: Diagnosis not present

## 2022-09-25 DIAGNOSIS — R296 Repeated falls: Secondary | ICD-10-CM

## 2022-09-25 DIAGNOSIS — E039 Hypothyroidism, unspecified: Secondary | ICD-10-CM | POA: Diagnosis not present

## 2022-09-25 DIAGNOSIS — I10 Essential (primary) hypertension: Secondary | ICD-10-CM | POA: Diagnosis not present

## 2022-09-25 NOTE — Progress Notes (Signed)
Provider:  Coralyn Helling, M.D. Location:  [facility] Room Number: 101 A Place of Service:  SNF (31)  PCP: Lauro Regulus, MD Patient Care Team: Lauro Regulus, MD as PCP - General (Internal Medicine) Galen Manila, MD as Referring Physician (Ophthalmology) Smiley Houseman, PA-C as Physician Assistant Juanell Fairly, MD as Referring Physician (Orthopedic Surgery)  Extended Emergency Contact Information Primary Emergency Contact: Leitha Schuller [place] Phone: [phone] Relation: Sister Secondary Emergency Contact: Lowry Bowl Address: [address]          [place], [place] [address] [place] Home Phone: [phone] Relation: Daughter  Code Status: Full Code  Goals of Care: Advanced Directive information    09/25/2022    8:31 AM  Advanced Directives  Does Patient Have a Medical Advance Directive? No  Would patient like information on creating a medical advance directive? No - Patient declined      Chief Complaint  Patient presents with  . New Admit To SNF    New admission to [facility]     HPI: Patient is a 77 y.o. female seen today for admission to [facility] after a fall. She wen to physician and went the hospital for an evaluation due to abnormal HR.  She was in the hospital ~1 week and denies confusion. Denied chest pain, shortness of breath, etc. She was surprised to find out that she was in atrial fibrillation. She had low potassium. She had constipation while in the hospital and had an enema. She has no energy and has been "so week" for a long time. Since Double Knee replacement (2022). She has had at least 3 falls in the last year but no fracture. She is not confident in walking by herself.   Denied blood in gums, bleeding, or stool.  She was restarted on her thyroid medication again. This medication was discontinued and now she is back on it again.   At home she sleeps  till 9am. She does her chores in her home and gets ready for the day. She sits in the chair to watch TV. She cooks some, but not much. They pay someone to clean. People bring in food and they get take out. She lives at home with her husband. She stopped driving 1 year ago. She takes bird baths by herself. Her sister comes to help with her washing her hair. Her sister puts her medications in compartment for her. 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   Past Medical History:  Diagnosis Date  . Anxiety   . Depression   . Family history of adverse reaction to anesthesia    sister had a reaction when had a baby  . GERD (gastroesophageal reflux disease)   . Hyperlipidemia 12/21/2005  . Hypertension   . Hypothyroidism   . Insomnia   . OAB (overactive bladder)   . Obstructive sleep apnea   . Osteoarthrosis   . Vitamin D deficiency    Past Surgical History:  Procedure Laterality Date  . COLONOSCOPY     X 2  . Cortisone injections in back    . EYE SURGERY    . 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 TUBAL LIGATION  1981    reports that she has quit smoking. She has never  used smokeless tobacco. She reports current alcohol use. She reports that she does not use drugs. Social History   Socioeconomic History  . Marital status: Married    Spouse name: Not on file  . Number of children: 3  . Years of education: Not on file  . Highest education level: Master's degree (e.g., MA, MS, MEng, MEd, MSW, MBA)  Occupational History  . Occupation: retired  Tobacco Use  . Smoking status: Former  . Smokeless tobacco: Never  . Tobacco comments:    in college  Vaping Use  . Vaping Use: Never used  Substance and Sexual Activity  . Alcohol use: Yes    Comment: rarely 1 drink  . Drug use: No  .  Sexual activity: Not on file  Other Topics Concern  . Not on file  Social History Narrative  . Not on file   Social Determinants of Health   Financial Resource Strain: Low Risk  (04/04/2020)   Overall Financial Resource Strain (CARDIA)   . Difficulty of Paying Living Expenses: Not hard at all  Food Insecurity: No Food Insecurity (09/17/2022)   Hunger Vital Sign   . Worried About Programme researcher, broadcasting/film/video in the Last Year: Never true   . Ran Out of Food in the Last Year: Never true  Transportation Needs: No Transportation Needs (09/17/2022)   PRAPARE - Transportation   . Lack of Transportation (Medical): No   . Lack of Transportation (Non-Medical): No  Physical Activity: Inactive (04/04/2020)   Exercise Vital Sign   . Days of Exercise per Week: 0 days   . Minutes of Exercise per Session: 0 min  Stress: No Stress Concern Present (04/04/2020)   [employer] of Occupational Health - Occupational Stress Questionnaire   . Feeling of Stress : Not at all  Social Connections: Socially Integrated (04/04/2020)   Social Connection and Isolation Panel [NHANES]   . Frequency of Communication with Friends and Family: More than three times a week   . Frequency of Social Gatherings with Friends and Family: More than three times a week   . Attends Religious Services: More than 4 times per year   . Active Member of Clubs or Organizations: Yes   . Attends Banker Meetings: More than 4 times per year   . Marital Status: Married  Catering manager Violence: Not At Risk (09/17/2022)   Humiliation, Afraid, Rape, and Kick questionnaire   . Fear of Current or Ex-Partner: No   . Emotionally Abused: No   . Physically Abused: No   . Sexually Abused: No    Functional Status Survey:    Family History  Problem Relation Age of Onset  . Diabetes Father   . Breast cancer Neg Hx     Health Maintenance  Topic Date Due  . Zoster Vaccines- Shingrix (1 of 2) Never done  . Medicare Annual Wellness  (AWV)  04/04/2021  . COVID-19 Vaccine (5 - 2023-24 season) 11/21/2021  . INFLUENZA VACCINE  10/22/2022  . DEXA SCAN  07/12/2024  . DTaP/Tdap/Td (3 - Td or Tdap) 09/15/2032  . Pneumonia Vaccine 71+ Years old  Completed  . Hepatitis C Screening  Completed  . HPV VACCINES  Aged Out  . Colonoscopy  Discontinued    Allergies  Allergen Reactions  . Oxycodone Other (See Comments)    hallucinated  . Amlodipine Swelling    Patient has been tolerating low dose    Outpatient Encounter Medications as of 09/25/2022  Medication Sig  .  acetaminophen (TYLENOL) 650 MG CR tablet Take 1,300 mg by mouth every 8 (eight) hours as needed for pain.  Melene Muller ON 10/01/2022] amiodarone (PACERONE) 200 MG tablet Take 1 tablet (200 mg total) by mouth daily.  Marland Kitchen amiodarone (PACERONE) 400 MG tablet Take 0.5 tablets (200 mg total) by mouth 2 (two) times daily for 7 days.  Marland Kitchen apixaban (ELIQUIS) 5 MG TABS tablet Take 1 tablet (5 mg total) by mouth 2 (two) times daily.  Marland Kitchen buPROPion (WELLBUTRIN XL) 300 MG 24 hr tablet Take 1 tablet (300 mg total) by mouth daily.  . Carboxymethylcellulose Sodium (THERATEARS OP) Place 1 drop into both eyes daily as needed (dry eyes).  . cyanocobalamin 1000 MCG tablet Take 1 tablet (1,000 mcg total) by mouth daily.  . furosemide (LASIX) 40 MG tablet Take 1 tablet (40 mg total) by mouth daily.  Marland Kitchen gabapentin (NEURONTIN) 100 MG capsule Take 2 capsules (200 mg total) by mouth 2 (two) times daily.  Marland Kitchen levothyroxine (SYNTHROID) 88 MCG tablet Take 1 tablet (88 mcg total) by mouth daily.  Marland Kitchen lovastatin (MEVACOR) 40 MG tablet TAKE 1 TABLET BY MOUTH EVERYDAY AT BEDTIME  . metoprolol succinate (TOPROL-XL) 50 MG 24 hr tablet Take 1 tablet (50 mg total) by mouth daily. Take with or immediately following a meal.  . mirabegron ER (MYRBETRIQ) 25 MG TB24 tablet Take 1 tablet (25 mg total) by mouth daily.  Marland Kitchen nystatin cream (MYCOSTATIN) Apply 1 Application topically 2 (two) times daily.  Marland Kitchen omeprazole (PRILOSEC)  20 MG capsule TAKE 1 CAPSULE BY MOUTH EVERY DAY  . senna (SENOKOT) 8.6 MG tablet Take 2 tablets (17.2 mg total) by mouth at bedtime.  . traZODone (DESYREL) 50 MG tablet Take 2 tablets (100 mg total) by mouth daily.  . Vitamin D3 (VITAMIN D) 25 MCG tablet Take 1,000 Units by mouth daily.  . NON FORMULARY CPAP  . [DISCONTINUED] buPROPion (WELLBUTRIN XL) 300 MG 24 hr tablet Take 300 mg by mouth daily.   No facility-administered encounter medications on file as of 09/25/2022.    Review of Systems  Vitals:   09/25/22 0829  BP: 127/78  Pulse: (!) 58  Resp: 18  Temp: (!) 97.1 F (36.2 C)  SpO2: 92%  Weight: 221 lb 8 oz (100.5 kg)  Height: 5\' 5"  (1.651 m)   Body mass index is 36.86 kg/m. Physical Exam  Labs reviewed: Basic Metabolic Panel: Recent Labs    09/16/22 1849 09/17/22 0504 09/18/22 0115 09/19/22 0622 09/20/22 0511 09/23/22 0432  NA 138   < > 137 137 137 135  K 2.9*   < > 3.6 3.8 4.1 3.9  CL 107   < > 101 102 102 98  CO2 24   < > 26 29 29 27   GLUCOSE 72   < > 109* 96 98 100*  BUN 27*   < > 20 23 24* 21  CREATININE 0.77   < > 1.00 0.98 0.91 0.73  CALCIUM 7.1*   < > 8.4* 8.5* 8.7* 8.4*  MG 1.9  --  1.8  --  2.0  --   PHOS  --   --   --   --  4.0  --    < > = values in this interval not displayed.   Liver Function Tests: Recent Labs    09/16/22 1849  AST 17  ALT 14  ALKPHOS 72  BILITOT 0.5  PROT 5.4*  ALBUMIN 3.0*   No results for input(s): "LIPASE", "AMYLASE" in the  last 8760 hours. No results for input(s): "AMMONIA" in the last 8760 hours. CBC: Recent Labs    09/16/22 1644 09/17/22 0504 09/18/22 0115 09/19/22 0218 09/20/22 0511  WBC 9.8   < > 8.3 8.1 8.5  NEUTROABS 7.0  --   --   --   --   HGB 15.3*   < > 14.2 14.0 13.8  HCT 47.2*   < > 42.1 41.2 40.9  MCV 100.9*   < > 99.3 99.8 99.3  PLT 208   < > 174 161 176   < > = values in this interval not displayed.   Cardiac Enzymes: Recent Labs    09/18/22 1030  CKTOTAL 27*   BNP: Invalid  input(s): "POCBNP" Lab Results  Component Value Date   HGBA1C 5.5 01/30/2021   Lab Results  Component Value Date   TSH 6.597 (H) 09/20/2022   Lab Results  Component Value Date   VITAMINB12 248 09/18/2022   Lab Results  Component Value Date   FOLATE 12.6 09/18/2022   Lab Results  Component Value Date   IRON 89 11/16/2019   TIBC 320 11/16/2019   FERRITIN 49 11/16/2019    Imaging and Procedures obtained prior to SNF admission: US RENAL  Result Date: 09/18/2022 CLINICAL DATA:  Incontinence EXAM: RENAL / URINARY TRACT ULTRASOUND COMPLETE COMPARISON:  None Available. FINDINGS: Right Kidney: Renal measurements: 11.0 x 5.3 x 5.0 = volume: 153.8 mL. Echogenicity within normal limits. No mass or hydronephrosis visualized. Left Kidney: Renal measurements: 11.1 x 5.9 x 5.1 cm = volume: 174 mL. Echogenicity within normal limits. No mass or hydronephrosis visualized. Bladder: Appears normal for degree of bladder distention. Ureteral jets were seen. Other: None. IMPRESSION: No collecting system dilatation. Electronically Signed   By: Novalynn Kays M.D.   On: 09/18/2022 14:50   ECHOCARDIOGRAM COMPLETE  Result Date: 09/18/2022    ECHOCARDIOGRAM REPORT   Patient Name:   Robin Espinoza Ky Date of Exam: 09/18/2022 Medical Rec #:  [id number]     Height:       65.0 in Accession #:    [id number]    Weight:       200.0 lb Date of Birth:  1945-04-24    BSA:          1.978 m Patient Age:    76 years      BP:           129/82 mmHg Patient Gender: F             HR:           62 bpm. Exam Location:  [facility] Procedure: 2D Echo, Cardiac Doppler and Color Doppler Indications:     Atrial Fibrillation I48.91  History:         Patient has no prior history of Echocardiogram examinations.                  Risk Factors:Hypertension and Dyslipidemia.  Sonographer:     Cristela Blue Referring Phys:  [id number] JAN A MANSY Diagnosing Phys: Yvonne Kendall MD  Sonographer Comments: Suboptimal apical window. IMPRESSIONS  1. Left ventricular  ejection fraction, by estimation, is 55 to 60%. The left ventricle has normal function. Left ventricular endocardial border not optimally defined to evaluate regional wall motion. There is moderate left ventricular hypertrophy. Left ventricular diastolic parameters are indeterminate.  2. Right ventricular systolic function is low normal. The right ventricular size is normal. Mildly increased right ventricular wall thickness. Tricuspid regurgitation signal is  inadequate for assessing PA pressure.  3. The mitral valve is normal in structure. Trivial mitral valve regurgitation.  4. The aortic valve has an indeterminant number of cusps. There is mild calcification of the aortic valve. There is mild thickening of the aortic valve. Aortic valve regurgitation is not visualized. No aortic stenosis is present. FINDINGS  Left Ventricle: Left ventricular ejection fraction, by estimation, is 55 to 60%. The left ventricle has normal function. Left ventricular endocardial border not optimally defined to evaluate regional wall motion. The left ventricular internal cavity size was normal in size. There is moderate left ventricular hypertrophy. Left ventricular diastolic parameters are indeterminate. Right Ventricle: The right ventricular size is normal. Mildly increased right ventricular wall thickness. Right ventricular systolic function is low normal. Tricuspid regurgitation signal is inadequate for assessing PA pressure. Left Atrium: Left atrial size was normal in size. Right Atrium: Right atrial size was normal in size. Pericardium: The pericardium was not well visualized. Mitral Valve: The mitral valve is normal in structure. Trivial mitral valve regurgitation. Tricuspid Valve: The tricuspid valve is not well visualized. Tricuspid valve regurgitation is trivial. Aortic Valve: The aortic valve has an indeterminant number of cusps. There is mild calcification of the aortic valve. There is mild thickening of the aortic valve.  Aortic valve regurgitation is not visualized. No aortic stenosis is present. Aortic valve mean gradient measures 3.5 mmHg. Aortic valve peak gradient measures 5.7 mmHg. Aortic valve area, by VTI measures 1.95 cm. Pulmonic Valve: The pulmonic valve was not well visualized. Pulmonic valve regurgitation is not visualized. No evidence of pulmonic stenosis. Aorta: The aortic root is normal in size and structure. Pulmonary Artery: The pulmonary artery is not well seen. Venous: The inferior vena cava was not well visualized. IAS/Shunts: The interatrial septum was not well visualized.  LEFT VENTRICLE PLAX 2D LVIDd:         4.00 cm   Diastology LVIDs:         2.80 cm   LV e' medial:    6.42 cm/s LV PW:         1.40 cm   LV E/e' medial:  12.2 LV IVS:        1.50 cm   LV e' lateral:   8.49 cm/s LVOT diam:     2.00 cm   LV E/e' lateral: 9.2 LV SV:         44 LV SV Index:   22 LVOT Area:     3.14 cm  RIGHT VENTRICLE RV S prime:     14.70 cm/s TAPSE (M-mode): 1.6 cm LEFT ATRIUM             Index        RIGHT ATRIUM           Index LA diam:        3.80 cm 1.92 cm/m   RA Area:     14.30 cm LA Vol (A2C):   49.3 ml 24.92 ml/m  RA Volume:   30.60 ml  15.47 ml/m LA Vol (A4C):   41.5 ml 20.98 ml/m LA Biplane Vol: 45.5 ml 23.00 ml/m  AORTIC VALVE AV Area (Vmax):    1.62 cm AV Area (Vmean):   1.64 cm AV Area (VTI):     1.95 cm AV Vmax:           119.00 cm/s AV Vmean:          84.800 cm/s AV VTI:  0.224 m AV Peak Grad:      5.7 mmHg AV Mean Grad:      3.5 mmHg LVOT Vmax:         61.50 cm/s LVOT Vmean:        44.200 cm/s LVOT VTI:          0.139 m LVOT/AV VTI ratio: 0.62  AORTA Ao Root diam: 3.00 cm MITRAL VALVE MV Area (PHT): 3.99 cm    SHUNTS MV Decel Time: 190 msec    Systemic VTI:  0.14 m MV E velocity: 78.10 cm/s  Systemic Diam: 2.00 cm MV A velocity: 34.60 cm/s MV E/A ratio:  2.26 Yvonne Kendall MD Electronically signed by Yvonne Kendall MD Signature Date/Time: 09/18/2022/1:41:14 PM    Final      Assessment/Plan There are no diagnoses linked to this encounter.   Family/ staff Communication:   Labs/tests ordered:

## 2022-10-01 DIAGNOSIS — G4733 Obstructive sleep apnea (adult) (pediatric): Secondary | ICD-10-CM | POA: Diagnosis not present

## 2022-10-06 ENCOUNTER — Ambulatory Visit: Payer: PPO | Admitting: Medical

## 2022-10-12 ENCOUNTER — Other Ambulatory Visit: Payer: Self-pay | Admitting: Family Medicine

## 2022-10-12 DIAGNOSIS — I10 Essential (primary) hypertension: Secondary | ICD-10-CM

## 2022-10-13 NOTE — Telephone Encounter (Signed)
Requested Prescriptions  Refused Prescriptions Disp Refills   metoprolol succinate (TOPROL-XL) 100 MG 24 hr tablet [Pharmacy Med Name: METOPROLOL SUCC ER 100 MG TAB] 90 tablet 0    Sig: TAKE 1 TABLET BY MOUTH EVERY DAY WITH OR IMMEDIATELY FOLLOWING A MEAL     Cardiovascular:  Beta Blockers Failed - 10/12/2022  2:28 AM      Failed - Valid encounter within last 6 months    Recent Outpatient Visits           1 year ago Hypertension, unspecified type   [place] [facility] [place], [place], PA-C   1 year ago Bilateral impacted cerumen   [facility] [place], [place], PA-C   1 year ago Bilateral impacted cerumen   [place] [facility] Alfredia Ferguson, PA-C   1 year ago Chronic idiopathic constipation   [facility] Merita Norton T, FNP   2 years ago Essential hypertension   [place] [facility] Chrismon, Jodell Cipro, PA-C              Passed - Last BP in normal range    BP Readings from Last 1 Encounters:  09/25/22 127/78         Passed - Last Heart Rate in normal range    Pulse Readings from Last 1 Encounters:  09/25/22 (!) 58

## 2022-10-18 ENCOUNTER — Other Ambulatory Visit: Payer: Self-pay | Admitting: Family Medicine

## 2022-10-18 DIAGNOSIS — K219 Gastro-esophageal reflux disease without esophagitis: Secondary | ICD-10-CM

## 2022-10-19 LAB — BASIC METABOLIC PANEL
BUN: 14 (ref 4–21)
CO2: 32 — AB (ref 13–22)
Chloride: 101 (ref 99–108)
Creatinine: 0.8 (ref 0.5–1.1)
Glucose: 73
Potassium: 4 mEq/L (ref 3.5–5.1)
Sodium: 141 (ref 137–147)

## 2022-10-19 LAB — COMPREHENSIVE METABOLIC PANEL
Calcium: 9.1 (ref 8.7–10.7)
eGFR: 73

## 2022-10-20 NOTE — Telephone Encounter (Signed)
Requested Prescriptions  Refused Prescriptions Disp Refills   omeprazole (PRILOSEC) 20 MG capsule [Pharmacy Med Name: OMEPRAZOLE DR 20 MG CAPSULE] 90 capsule 1    Sig: TAKE 1 CAPSULE BY MOUTH EVERY DAY     Gastroenterology: Proton Pump Inhibitors Failed - 10/18/2022  1:15 AM      Failed - Valid encounter within last 12 months    Recent Outpatient Visits           1 year ago Hypertension, unspecified type   [place] [place], [place], PA-C   1 year ago Bilateral impacted cerumen   [place] [facility], [place], PA-C   1 year ago Bilateral impacted cerumen   [facility] Alfredia Ferguson, PA-C   1 year ago Chronic idiopathic constipation   [facility] Merita Norton T, FNP   2 years ago Essential hypertension   [facility] Chrismon, Jodell Cipro, [place]

## 2022-10-23 ENCOUNTER — Non-Acute Institutional Stay (SKILLED_NURSING_FACILITY): Payer: Self-pay | Admitting: Student

## 2022-10-23 ENCOUNTER — Encounter: Payer: Self-pay | Admitting: Student

## 2022-10-23 DIAGNOSIS — H6123 Impacted cerumen, bilateral: Secondary | ICD-10-CM | POA: Diagnosis not present

## 2022-10-23 DIAGNOSIS — E039 Hypothyroidism, unspecified: Secondary | ICD-10-CM | POA: Diagnosis not present

## 2022-10-23 DIAGNOSIS — I10 Essential (primary) hypertension: Secondary | ICD-10-CM | POA: Diagnosis not present

## 2022-10-23 DIAGNOSIS — K219 Gastro-esophageal reflux disease without esophagitis: Secondary | ICD-10-CM | POA: Diagnosis not present

## 2022-10-23 DIAGNOSIS — F339 Major depressive disorder, recurrent, unspecified: Secondary | ICD-10-CM

## 2022-10-23 DIAGNOSIS — I4891 Unspecified atrial fibrillation: Secondary | ICD-10-CM | POA: Diagnosis not present

## 2022-10-23 NOTE — Progress Notes (Unsigned)
Location:  Other [facility].  Nursing Home Room Number: [facility] 101A Place of Service:  SNF [phone]) Provider:  Dr. Earnestine Mealing  PCP: Lauro Regulus, MD  Patient Care Team: Lauro Regulus, MD as PCP - General (Internal Medicine) Galen Manila, MD as Referring Physician (Ophthalmology) Marianne Sofia as Physician Assistant Juanell Fairly, MD as Referring Physician (Orthopedic Surgery)  Extended Emergency Contact Information Primary Emergency Contact: Leitha Schuller [place] of [place] Phone: [phone] Relation: Sister Secondary Emergency Contact: Lowry Bowl Address: [address]          [place], [place] [address] [place] Home Phone: [phone] Relation: Daughter  Code Status:  DNR Goals of care: Advanced Directive information    10/23/2022    9:37 AM  Advanced Directives  Does Patient Have a Medical Advance Directive? Yes  Type of Advance Directive Out of facility DNR (pink MOST or yellow form)  Does patient want to make changes to medical advance directive? No - Patient declined     Chief Complaint  Patient presents with  . Medical Management of Chronic Issues    Medical Management of Chronic Issues.     HPI:  Pt is a 77 y.o. female seen today for medical management of chronic diseases.    Patient is doing well. She is curious about her thyroid She is interested in whether or not she has wax build up in her ears. She thinks she should be ready to go home in 1 week or so. Therapy is going well. She is eating well. Eating good bowel movements with prune aplple juice and miralax. She is urinating well. Lasix has made that.     Past Medical History:  Diagnosis Date  . Anxiety   . Depression   . Family history of adverse reaction to anesthesia    sister had a reaction when had a baby  . GERD (gastroesophageal reflux disease)   . Hyperlipidemia 12/21/2005  . Hypertension   . Hypothyroidism   .  Insomnia   . OAB (overactive bladder)   . Obstructive sleep apnea   . Osteoarthrosis   . Vitamin D deficiency    Past Surgical History:  Procedure Laterality Date  . COLONOSCOPY     X 2  . Cortisone injections in back    . EYE SURGERY    . 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 TUBAL LIGATION  1981    Allergies  Allergen Reactions  . Oxycodone Other (See Comments)    hallucinated  . Amlodipine Swelling    Patient has been tolerating low dose    Outpatient Encounter Medications as of 10/23/2022  Medication Sig  . acetaminophen (TYLENOL) 650 MG CR tablet Take 1,300 mg by mouth every 8 (eight) hours as needed for pain.  Marland Kitchen amiodarone (PACERONE) 200 MG tablet Take 1 tablet (200 mg total) by mouth daily.  Marland Kitchen apixaban (ELIQUIS) 5 MG TABS tablet Take 1 tablet (5 mg total) by mouth 2 (two) times daily.  Marland Kitchen buPROPion (WELLBUTRIN XL) 300 MG 24 hr tablet Take 1 tablet (300 mg total) by mouth daily.  . Carboxymethylcellulose Sodium (THERATEARS OP) Place 1 drop into both eyes daily as needed (dry eyes).  . cyanocobalamin 1000 MCG tablet Take 1 tablet (1,000 mcg total) by mouth daily.  . furosemide (LASIX) 40 MG tablet Take 1 tablet (40  mg total) by mouth daily.  Marland Kitchen gabapentin (NEURONTIN) 100 MG capsule Take 2 capsules (200 mg total) by mouth 2 (two) times daily.  Marland Kitchen levothyroxine (SYNTHROID) 88 MCG tablet Take 1 tablet (88 mcg total) by mouth daily.  Marland Kitchen lovastatin (MEVACOR) 40 MG tablet TAKE 1 TABLET BY MOUTH EVERYDAY AT BEDTIME  . metoprolol succinate (TOPROL-XL) 50 MG 24 hr tablet Take 1 tablet (50 mg total) by mouth daily. Take with or immediately following a meal.  . NON FORMULARY CPAP  . nystatin cream (MYCOSTATIN) Apply 1 Application topically 2 (two) times daily.  Marland Kitchen omeprazole (PRILOSEC) 20 MG capsule TAKE 1 CAPSULE BY MOUTH EVERY DAY  .  polyethylene glycol (MIRALAX / GLYCOLAX) 17 g packet Take 17 g by mouth daily as needed.  . senna (SENOKOT) 8.6 MG tablet Take 2 tablets (17.2 mg total) by mouth at bedtime.  . traZODone (DESYREL) 50 MG tablet Take 2 tablets (100 mg total) by mouth daily.  . Vitamin D3 (VITAMIN D) 25 MCG tablet Take 1,000 Units by mouth daily.  . Zinc Oxide (TRIPLE PASTE) 12.8 % ointment Apply 1 Application topically. To buttocks every shift.  . [DISCONTINUED] amiodarone (PACERONE) 400 MG tablet Take 0.5 tablets (200 mg total) by mouth 2 (two) times daily for 7 days.  . [DISCONTINUED] mirabegron ER (MYRBETRIQ) 25 MG TB24 tablet Take 1 tablet (25 mg total) by mouth daily.   No facility-administered encounter medications on file as of 10/23/2022.    Review of Systems  Immunization History  Administered Date(s) Administered  . Fluad Quad(high Dose 65+) 12/27/2019  . Influenza, High Dose Seasonal PF 12/03/2014, 01/28/2018, 01/08/2021  . Influenza,inj,quad, With Preservative 12/22/2019  . Influenza-Unspecified 01/11/2016, 01/19/2017, 01/06/2019, 12/31/2021  . Moderna Covid-19 Vaccine Bivalent Booster 86yrs & up 01/08/2021  . Moderna Sars-Covid-2 Vaccination 05/02/2019, 05/30/2019, 03/23/2020  . PNEUMOCOCCAL CONJUGATE-20 09/25/2022  . Pneumococcal Conjugate-13 03/08/2017  . Pneumococcal Polysaccharide-23 03/20/2011  . Td 03/10/2004  . Tdap 09/16/2022   Pertinent  Health Maintenance Due  Topic Date Due  . INFLUENZA VACCINE  10/22/2022  . DEXA SCAN  07/12/2024  . Colonoscopy  Discontinued      09/28/2019    8:00 PM 09/29/2019    9:00 AM 04/04/2020    2:31 PM 09/10/2021    2:12 PM 09/12/2021    2:25 PM  Fall Risk  Falls in the past year?   1 1 1   Was there an injury with Fall?   0 0 0  Fall Risk Category Calculator   2 2 2   Fall Risk Category (Retired)   Moderate Moderate Moderate  (RETIRED) Patient Fall Risk Level High fall risk High fall risk Low fall risk  Moderate fall risk  Patient at Risk for Falls  Due to   Orthopedic patient  History of fall(s)  Fall risk Follow up   Falls prevention discussed Falls evaluation completed    Functional Status Survey:    Vitals:   10/23/22 0930  BP: (!) 158/82  Pulse: (!) 52  Resp: 18  Temp: (!) 96.6 F (35.9 C)  SpO2: 95%  Weight: 226 lb 12.8 oz (102.9 kg)  Height: 5\' 5"  (1.651 m)   Body mass index is 37.74 kg/m. Physical Exam Constitutional:      Appearance: Normal appearance. She is obese.  Cardiovascular:     Rate and Rhythm: Normal rate and regular rhythm.     Pulses: Normal pulses.  Pulmonary:     Effort: Pulmonary effort is normal.  Neurological:  Mental Status: She is alert.    Labs reviewed: Recent Labs    09/16/22 1849 09/17/22 0504 09/18/22 0115 09/19/22 0622 09/20/22 0511 09/23/22 0432 10/19/22 0000  NA 138   < > 137 137 137 135 141  K 2.9*   < > 3.6 3.8 4.1 3.9 4.0  CL 107   < > 101 102 102 98 101  CO2 24   < > 26 29 29 27  32*  GLUCOSE 72   < > 109* 96 98 100*  --   BUN 27*   < > 20 23 24* 21 14  CREATININE 0.77   < > 1.00 0.98 0.91 0.73 0.8  CALCIUM 7.1*   < > 8.4* 8.5* 8.7* 8.4* 9.1  MG 1.9  --  1.8  --  2.0  --   --   PHOS  --   --   --   --  4.0  --   --    < > = values in this interval not displayed.   Recent Labs    09/16/22 1849  AST 17  ALT 14  ALKPHOS 72  BILITOT 0.5  PROT 5.4*  ALBUMIN 3.0*   Recent Labs    09/16/22 1644 09/17/22 0504 09/18/22 0115 09/19/22 0218 09/20/22 0511  WBC 9.8   < > 8.3 8.1 8.5  NEUTROABS 7.0  --   --   --   --   HGB 15.3*   < > 14.2 14.0 13.8  HCT 47.2*   < > 42.1 41.2 40.9  MCV 100.9*   < > 99.3 99.8 99.3  PLT 208   < > 174 161 176   < > = values in this interval not displayed.   Lab Results  Component Value Date   TSH 6.597 (H) 09/20/2022   Lab Results  Component Value Date   HGBA1C 5.5 01/30/2021   Lab Results  Component Value Date   CHOL 181 08/21/2020   HDL 50 08/21/2020   LDLCALC 109 (H) 08/21/2020   TRIG 125 08/21/2020   CHOLHDL  3.6 08/21/2020    Significant Diagnostic Results in last 30 days:  No results found.  Assessment/Plan There are no diagnoses linked to this encounter.   Family/ staff Communication: ***  Labs/tests ordered:  ***

## 2022-10-29 ENCOUNTER — Non-Acute Institutional Stay (SKILLED_NURSING_FACILITY): Payer: PPO | Admitting: Nurse Practitioner

## 2022-10-29 ENCOUNTER — Encounter: Payer: Self-pay | Admitting: Nurse Practitioner

## 2022-10-29 DIAGNOSIS — E785 Hyperlipidemia, unspecified: Secondary | ICD-10-CM | POA: Diagnosis not present

## 2022-10-29 DIAGNOSIS — F5101 Primary insomnia: Secondary | ICD-10-CM | POA: Diagnosis not present

## 2022-10-29 DIAGNOSIS — E039 Hypothyroidism, unspecified: Secondary | ICD-10-CM | POA: Diagnosis not present

## 2022-10-29 DIAGNOSIS — K219 Gastro-esophageal reflux disease without esophagitis: Secondary | ICD-10-CM | POA: Diagnosis not present

## 2022-10-29 DIAGNOSIS — I509 Heart failure, unspecified: Secondary | ICD-10-CM

## 2022-10-29 DIAGNOSIS — F339 Major depressive disorder, recurrent, unspecified: Secondary | ICD-10-CM

## 2022-10-29 DIAGNOSIS — I10 Essential (primary) hypertension: Secondary | ICD-10-CM | POA: Diagnosis not present

## 2022-10-29 DIAGNOSIS — I4891 Unspecified atrial fibrillation: Secondary | ICD-10-CM | POA: Diagnosis not present

## 2022-10-29 DIAGNOSIS — G629 Polyneuropathy, unspecified: Secondary | ICD-10-CM

## 2022-10-29 MED ORDER — BUPROPION HCL ER (XL) 300 MG PO TB24
300.0000 mg | ORAL_TABLET | Freq: Every day | ORAL | 0 refills | Status: DC
Start: 2022-10-29 — End: 2022-11-30

## 2022-10-29 MED ORDER — FUROSEMIDE 40 MG PO TABS: 40.0000 mg | ORAL_TABLET | Freq: Every day | ORAL | 0 refills | Status: DC

## 2022-10-29 MED ORDER — LEVOTHYROXINE SODIUM 88 MCG PO TABS
88.0000 ug | ORAL_TABLET | Freq: Every day | ORAL | 0 refills | Status: DC
Start: 2022-10-29 — End: 2022-11-30

## 2022-10-29 MED ORDER — APIXABAN 5 MG PO TABS
5.0000 mg | ORAL_TABLET | Freq: Two times a day (BID) | ORAL | 0 refills | Status: AC
Start: 2022-10-29 — End: ?

## 2022-10-29 MED ORDER — AMIODARONE HCL 200 MG PO TABS
200.0000 mg | ORAL_TABLET | Freq: Every day | ORAL | 0 refills | Status: AC
Start: 2022-10-29 — End: ?

## 2022-10-29 MED ORDER — OMEPRAZOLE 20 MG PO CPDR
DELAYED_RELEASE_CAPSULE | ORAL | 0 refills | Status: AC
Start: 2022-10-29 — End: ?

## 2022-10-29 MED ORDER — GABAPENTIN 100 MG PO CAPS
200.0000 mg | ORAL_CAPSULE | Freq: Two times a day (BID) | ORAL | 0 refills | Status: AC
Start: 2022-10-29 — End: ?

## 2022-10-29 MED ORDER — TRAZODONE HCL 100 MG PO TABS
100.0000 mg | ORAL_TABLET | Freq: Every day | ORAL | 0 refills | Status: AC
Start: 2022-10-29 — End: ?

## 2022-10-29 MED ORDER — METOPROLOL SUCCINATE ER 50 MG PO TB24
50.0000 mg | ORAL_TABLET | Freq: Every day | ORAL | 0 refills | Status: AC
Start: 2022-10-29 — End: ?

## 2022-10-29 MED ORDER — LOVASTATIN 40 MG PO TABS
40.0000 mg | ORAL_TABLET | Freq: Every day | ORAL | 0 refills | Status: AC
Start: 2022-10-29 — End: ?

## 2022-10-29 NOTE — Progress Notes (Signed)
Location:  Other [facility].  Nursing Home Room Number: [facility] 101A Place of Service:  SNF (31) Abbey Chatters, NP  PCP: Lauro Regulus, MD  Patient Care Team: Lauro Regulus, MD as PCP - General (Internal Medicine) Galen Manila, MD as Referring Physician (Ophthalmology) Smiley Houseman, PA-C as Physician Assistant Juanell Fairly, MD as Referring Physician (Orthopedic Surgery)  Extended Emergency Contact Information Primary Emergency Contact: Leitha Schuller [place] of [place] Phone: [phone] Relation: Sister Secondary Emergency Contact: Lowry Bowl Address: [address]          [place], [place] [address] [place] Home Phone: [phone] Relation: Daughter  Goals of care: Advanced Directive information    10/29/2022   10:16 AM  Advanced Directives  Does Patient Have a Medical Advance Directive? Yes  Type of Advance Directive Out of facility DNR (pink MOST or yellow form)  Does patient want to make changes to medical advance directive? No - Patient declined     Chief Complaint  Patient presents with   Discharge Note    Discharge.     HPI:  Pt is a 77 y.o. female seen today for Discharge. Pt was [facility] to [facility] after hospitalization for weakness due to a fib with RVR. Rate is controlled and maintained on amiodarone and metoprolol. No palpitations or chest pains noted.  She has been working with therapy to increase mobility and using walker.  Now stable to be discharged home with home health services.    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   amiodarone (PACERONE) 200 MG tablet Take 1 tablet (200 mg total) by mouth daily.   apixaban (ELIQUIS) 5 MG TABS tablet Take 1 tablet (5 mg total) by mouth 2 (two) times daily.   buPROPion (WELLBUTRIN XL) 300 MG 24 hr tablet Take 1 tablet (300 mg total) by mouth daily.   Carboxymethylcellulose Sodium (THERATEARS OP) Place 1 drop into both eyes daily as needed (dry eyes).   cyanocobalamin 1000 MCG tablet Take 1 tablet (1,000 mcg total) by mouth daily.   furosemide (LASIX) 40 MG tablet Take 1 tablet (40 mg total) by mouth daily.   gabapentin (NEURONTIN) 100 MG capsule Take 2 capsules (200 mg total) by mouth 2 (two) times daily.   levothyroxine (SYNTHROID) 88 MCG tablet  Take 1 tablet (88 mcg total) by mouth daily.   lovastatin (MEVACOR) 40 MG tablet TAKE 1 TABLET BY MOUTH EVERYDAY AT BEDTIME   metoprolol succinate (TOPROL-XL) 50 MG 24 hr tablet Take 1 tablet (50 mg total) by mouth daily. Take with or immediately following a meal.   NON FORMULARY CPAP   nystatin cream (MYCOSTATIN) Apply 1 Application topically 2 (two) times daily.   omeprazole (PRILOSEC) 20 MG capsule TAKE 1 CAPSULE BY MOUTH EVERY DAY   polyethylene glycol (MIRALAX / GLYCOLAX) 17 g packet Take 17 g by mouth daily as needed.   senna (SENOKOT) 8.6 MG tablet Take 2 tablets (17.2 mg total) by  mouth at bedtime.   traZODone (DESYREL) 50 MG tablet Take 2 tablets (100 mg total) by mouth daily.   Vitamin D3 (VITAMIN D) 25 MCG tablet Take 1,000 Units by mouth daily.   Zinc Oxide (TRIPLE PASTE) 12.8 % ointment Apply 1 Application topically. To buttocks every shift.   No facility-administered encounter medications on file as of 10/29/2022.    Review of Systems  Constitutional:  Negative for activity change, appetite change, fatigue and unexpected weight change.  HENT:  Negative for congestion and hearing loss.   Eyes: Negative.   Respiratory:  Negative for cough and shortness of breath.   Cardiovascular:  Negative for chest pain, palpitations and leg swelling.  Gastrointestinal:  Negative for abdominal pain, constipation and diarrhea.  Genitourinary:  Negative for difficulty urinating and dysuria.  Musculoskeletal:  Negative for arthralgias and myalgias.  Skin:  Negative for color change and wound.  Neurological:  Positive for weakness. Negative for dizziness.  Psychiatric/Behavioral:  Negative for agitation, behavioral problems and confusion.   .  Immunization History  Administered Date(s) Administered   Fluad Quad(high Dose 65+) 12/27/2019   Influenza, High Dose Seasonal PF 12/03/2014, 01/28/2018, 01/08/2021   Influenza,inj,quad, With Preservative 12/22/2019   Influenza-Unspecified 01/11/2016, 01/19/2017, 01/06/2019, 12/31/2021   Moderna Covid-19 Vaccine Bivalent Booster 64yrs & up 01/08/2021   Moderna Sars-Covid-2 Vaccination 05/02/2019, 05/30/2019, 03/23/2020   PNEUMOCOCCAL CONJUGATE-20 09/25/2022   Pneumococcal Conjugate-13 03/08/2017   Pneumococcal Polysaccharide-23 03/20/2011   Td 03/10/2004   Tdap 09/16/2022   Pertinent  Health Maintenance Due  Topic Date Due   INFLUENZA VACCINE  10/22/2022   DEXA SCAN  07/12/2024   Colonoscopy  Discontinued      09/28/2019    8:00 PM 09/29/2019    9:00 AM 04/04/2020    2:31 PM 09/10/2021    2:12 PM 09/12/2021    2:25 PM  Fall Risk   Falls in the past year?   1 1 1   Was there an injury with Fall?   0 0 0  Fall Risk Category Calculator   2 2 2   Fall Risk Category (Retired)   Moderate Moderate Moderate  (RETIRED) Patient Fall Risk Level High fall risk High fall risk Low fall risk  Moderate fall risk  Patient at Risk for Falls Due to   Orthopedic patient  History of fall(s)  Fall risk Follow up   Falls prevention discussed Falls evaluation completed    Functional Status Survey:    Vitals:   10/29/22 1006 10/29/22 1021  BP: (!) 148/94 122/77  Pulse: 60   Resp: 16   Temp: (!) 97.5 F (36.4 C)   SpO2: 98%   Weight: 230 lb (104.3 kg)   Height: 5\' 5"  (1.651 m)    Body mass index is 38.27 kg/m. Physical Exam Constitutional:      General:  She is not in acute distress.    Appearance: She is well-developed. She is not diaphoretic.  HENT:     Head: Normocephalic and atraumatic.     Mouth/Throat:     Pharynx: No oropharyngeal exudate.  Eyes:     Conjunctiva/sclera: Conjunctivae normal.     Pupils: Pupils are equal, round, and reactive to light.  Cardiovascular:     Rate and Rhythm: Normal rate and regular rhythm.     Heart sounds: Normal heart sounds.  Pulmonary:     Effort: Pulmonary effort is normal.     Breath sounds: Normal breath sounds.  Abdominal:     General: Bowel sounds are normal.     Palpations: Abdomen is soft.  Musculoskeletal:     Cervical back: Normal range of motion and neck supple.     Right lower leg: No edema.     Left lower leg: No edema.  Skin:    General: Skin is warm and dry.  Neurological:     Mental Status: She is alert.  Psychiatric:        Mood and Affect: Mood normal.     Labs reviewed: Recent Labs    09/16/22 1849 09/17/22 0504 09/18/22 0115 09/19/22 0622 09/20/22 0511 09/23/22 0432 10/19/22 0000  NA 138   < > 137 137 137 135 141  K 2.9*   < > 3.6 3.8 4.1 3.9 4.0  CL 107   < > 101 102 102 98 101  CO2 24   < > 26 29 29 27  32*  GLUCOSE 72   < > 109* 96 98 100*   --   BUN 27*   < > 20 23 24* 21 14  CREATININE 0.77   < > 1.00 0.98 0.91 0.73 0.8  CALCIUM 7.1*   < > 8.4* 8.5* 8.7* 8.4* 9.1  MG 1.9  --  1.8  --  2.0  --   --   PHOS  --   --   --   --  4.0  --   --    < > = values in this interval not displayed.   Recent Labs    09/16/22 1849  AST 17  ALT 14  ALKPHOS 72  BILITOT 0.5  PROT 5.4*  ALBUMIN 3.0*   Recent Labs    09/16/22 1644 09/17/22 0504 09/18/22 0115 09/19/22 0218 09/20/22 0511  WBC 9.8   < > 8.3 8.1 8.5  NEUTROABS 7.0  --   --   --   --   HGB 15.3*   < > 14.2 14.0 13.8  HCT 47.2*   < > 42.1 41.2 40.9  MCV 100.9*   < > 99.3 99.8 99.3  PLT 208   < > 174 161 176   < > = values in this interval not displayed.   Lab Results  Component Value Date   TSH 6.597 (H) 09/20/2022   Lab Results  Component Value Date   HGBA1C 5.5 01/30/2021   Lab Results  Component Value Date   CHOL 181 08/21/2020   HDL 50 08/21/2020   LDLCALC 109 (H) 08/21/2020   TRIG 125 08/21/2020   CHOLHDL 3.6 08/21/2020    Significant Diagnostic Results in last 30 days:  No results found.  Assessment/Plan 1. Atrial fibrillation, unspecified type (HCC) Rate controlled at this time, will need to follow up with cardiologist.  - metoprolol succinate (TOPROL-XL) 50 MG 24 hr tablet; Take 1 tablet (50 mg total) by mouth daily.  Take with or immediately following a meal.  Dispense: 60 tablet; Refill: 0 - amiodarone (PACERONE) 200 MG tablet; Take 1 tablet (200 mg total) by mouth daily.  Dispense: 30 tablet; Refill: 0 - apixaban (ELIQUIS) 5 MG TABS tablet; Take 1 tablet (5 mg total) by mouth 2 (two) times daily.  Dispense: 60 tablet; Refill: 0  2. Primary insomnia Controlled on trazodone - traZODone (DESYREL) 100 MG tablet; Take 1 tablet (100 mg total) by mouth daily.  Dispense: 30 tablet; Refill: 0  3. Gastroesophageal reflux disease without esophagitis Stable at this time, continue home regimen - omeprazole (PRILOSEC) 20 MG capsule; TAKE 1 CAPSULE BY  MOUTH EVERY DAY  Dispense: 30 capsule; Refill: 0  4. Hypothyroidism, unspecified type Levothyroxine increased to 88 mcg after last check, due for recheck after 8/15 To follow up with PCP or endocrine for recheck - levothyroxine (SYNTHROID) 88 MCG tablet; Take 1 tablet (88 mcg total) by mouth daily.  Dispense: 30 tablet; Refill: 0  5. Essential hypertension -Blood pressure well controlled, goal bp <140/90 Continue current medications and dietary modifications follow metabolic panel  6. Depression, recurrent (HCC) Stable on wellbutrin, will continue home regimen - buPROPion (WELLBUTRIN XL) 300 MG 24 hr tablet; Take 1 tablet (300 mg total) by mouth daily.  Dispense: 30 tablet; Refill: 0  7. Chronic heart failure, unspecified heart failure type (HCC) Stable at this time - metoprolol succinate (TOPROL-XL) 50 MG 24 hr tablet; Take 1 tablet (50 mg total) by mouth daily. Take with or immediately following a meal.  Dispense: 60 tablet; Refill: 0 - furosemide (LASIX) 40 MG tablet; Take 1 tablet (40 mg total) by mouth daily.  Dispense: 30 tablet; Refill: 0  8. Hyperlipidemia, unspecified hyperlipidemia type -continues home regimne - lovastatin (MEVACOR) 40 MG tablet; Take 1 tablet (40 mg total) by mouth at bedtime.  Dispense: 30 tablet; Refill: 0  9. Neuropathy - gabapentin (NEURONTIN) 100 MG capsule; Take 2 capsules (200 mg total) by mouth 2 (two) times daily.  Dispense: 120 capsule; Refill: 0  pt is stable for discharge-will need PT/OT per home health. DME needed includes FWW. Rx sent via epic for 1 month supply  will need to follow up with PCP within 2 weeks, also needs to make sure she has follow up with cardiologist due to new onset a fib.     Janene Harvey. Biagio Borg [facility] [phone]

## 2022-10-30 DIAGNOSIS — Z9181 History of falling: Secondary | ICD-10-CM | POA: Diagnosis not present

## 2022-10-30 DIAGNOSIS — I4891 Unspecified atrial fibrillation: Secondary | ICD-10-CM | POA: Diagnosis not present

## 2022-10-30 DIAGNOSIS — M1990 Unspecified osteoarthritis, unspecified site: Secondary | ICD-10-CM | POA: Diagnosis not present

## 2022-10-30 DIAGNOSIS — M6281 Muscle weakness (generalized): Secondary | ICD-10-CM | POA: Diagnosis not present

## 2022-11-01 DIAGNOSIS — G4733 Obstructive sleep apnea (adult) (pediatric): Secondary | ICD-10-CM | POA: Diagnosis not present

## 2022-11-02 DIAGNOSIS — E039 Hypothyroidism, unspecified: Secondary | ICD-10-CM | POA: Diagnosis not present

## 2022-11-02 DIAGNOSIS — K5909 Other constipation: Secondary | ICD-10-CM | POA: Diagnosis not present

## 2022-11-02 DIAGNOSIS — I4892 Unspecified atrial flutter: Secondary | ICD-10-CM | POA: Diagnosis not present

## 2022-11-02 DIAGNOSIS — F339 Major depressive disorder, recurrent, unspecified: Secondary | ICD-10-CM | POA: Diagnosis not present

## 2022-11-02 DIAGNOSIS — I480 Paroxysmal atrial fibrillation: Secondary | ICD-10-CM | POA: Diagnosis not present

## 2022-11-02 DIAGNOSIS — F419 Anxiety disorder, unspecified: Secondary | ICD-10-CM | POA: Diagnosis not present

## 2022-11-02 DIAGNOSIS — F0284 Dementia in other diseases classified elsewhere, unspecified severity, with anxiety: Secondary | ICD-10-CM | POA: Diagnosis not present

## 2022-11-02 DIAGNOSIS — E038 Other specified hypothyroidism: Secondary | ICD-10-CM | POA: Diagnosis not present

## 2022-11-02 DIAGNOSIS — R296 Repeated falls: Secondary | ICD-10-CM | POA: Diagnosis not present

## 2022-11-02 DIAGNOSIS — K219 Gastro-esophageal reflux disease without esophagitis: Secondary | ICD-10-CM | POA: Diagnosis not present

## 2022-11-02 DIAGNOSIS — S81802D Unspecified open wound, left lower leg, subsequent encounter: Secondary | ICD-10-CM | POA: Diagnosis not present

## 2022-11-02 DIAGNOSIS — M1990 Unspecified osteoarthritis, unspecified site: Secondary | ICD-10-CM | POA: Diagnosis not present

## 2022-11-02 DIAGNOSIS — G4733 Obstructive sleep apnea (adult) (pediatric): Secondary | ICD-10-CM | POA: Diagnosis not present

## 2022-11-02 DIAGNOSIS — I081 Rheumatic disorders of both mitral and tricuspid valves: Secondary | ICD-10-CM | POA: Diagnosis not present

## 2022-11-02 DIAGNOSIS — Z96651 Presence of right artificial knee joint: Secondary | ICD-10-CM | POA: Diagnosis not present

## 2022-11-02 DIAGNOSIS — E669 Obesity, unspecified: Secondary | ICD-10-CM | POA: Diagnosis not present

## 2022-11-02 DIAGNOSIS — M19012 Primary osteoarthritis, left shoulder: Secondary | ICD-10-CM | POA: Diagnosis not present

## 2022-11-02 DIAGNOSIS — E538 Deficiency of other specified B group vitamins: Secondary | ICD-10-CM | POA: Diagnosis not present

## 2022-11-02 DIAGNOSIS — S129XXD Fracture of neck, unspecified, subsequent encounter: Secondary | ICD-10-CM | POA: Diagnosis not present

## 2022-11-02 DIAGNOSIS — F0283 Dementia in other diseases classified elsewhere, unspecified severity, with mood disturbance: Secondary | ICD-10-CM | POA: Diagnosis not present

## 2022-11-02 DIAGNOSIS — Z6837 Body mass index (BMI) 37.0-37.9, adult: Secondary | ICD-10-CM | POA: Diagnosis not present

## 2022-11-02 DIAGNOSIS — Z556 Problems related to health literacy: Secondary | ICD-10-CM | POA: Diagnosis not present

## 2022-11-02 DIAGNOSIS — I4891 Unspecified atrial fibrillation: Secondary | ICD-10-CM | POA: Diagnosis not present

## 2022-11-02 DIAGNOSIS — Z7901 Long term (current) use of anticoagulants: Secondary | ICD-10-CM | POA: Diagnosis not present

## 2022-11-02 DIAGNOSIS — I7143 Infrarenal abdominal aortic aneurysm, without rupture: Secondary | ICD-10-CM | POA: Diagnosis not present

## 2022-11-02 DIAGNOSIS — N3281 Overactive bladder: Secondary | ICD-10-CM | POA: Diagnosis not present

## 2022-11-02 DIAGNOSIS — I5031 Acute diastolic (congestive) heart failure: Secondary | ICD-10-CM | POA: Diagnosis not present

## 2022-11-02 DIAGNOSIS — I110 Hypertensive heart disease with heart failure: Secondary | ICD-10-CM | POA: Diagnosis not present

## 2022-11-02 DIAGNOSIS — H6123 Impacted cerumen, bilateral: Secondary | ICD-10-CM | POA: Diagnosis not present

## 2022-11-02 DIAGNOSIS — E785 Hyperlipidemia, unspecified: Secondary | ICD-10-CM | POA: Diagnosis not present

## 2022-11-02 DIAGNOSIS — I2510 Atherosclerotic heart disease of native coronary artery without angina pectoris: Secondary | ICD-10-CM | POA: Diagnosis not present

## 2022-11-11 ENCOUNTER — Telehealth: Payer: Self-pay

## 2022-11-11 NOTE — Telephone Encounter (Signed)
Kathie Rhodes with [employer] called to see if order for custodial care would be signed for patient. I explained to her that patient was discharged from our care as of 10/29/22 and that we would be unable to sign any orders for patient. She stated that patient is transitioning between PCPs and they are unable to get an answer from that office.  They are desperately trying to ger orders signed for patient. She just asked that I ask anyway.  Message sent to Abbey Chatters, NP

## 2022-11-12 NOTE — Telephone Encounter (Signed)
She is no longer our patient so we are unable to sign

## 2022-11-13 DIAGNOSIS — R8290 Unspecified abnormal findings in urine: Secondary | ICD-10-CM | POA: Diagnosis not present

## 2022-11-13 DIAGNOSIS — I482 Chronic atrial fibrillation, unspecified: Secondary | ICD-10-CM | POA: Diagnosis not present

## 2022-11-13 DIAGNOSIS — D72829 Elevated white blood cell count, unspecified: Secondary | ICD-10-CM | POA: Diagnosis not present

## 2022-11-13 DIAGNOSIS — E039 Hypothyroidism, unspecified: Secondary | ICD-10-CM | POA: Diagnosis not present

## 2022-11-20 ENCOUNTER — Other Ambulatory Visit: Payer: Self-pay

## 2022-11-20 ENCOUNTER — Emergency Department: Payer: PPO

## 2022-11-20 ENCOUNTER — Inpatient Hospital Stay
Admission: EM | Admit: 2022-11-20 | Discharge: 2022-11-30 | DRG: 948 | Disposition: A | Discharge status: Skilled Nursing Facility | Payer: PPO | Attending: Internal Medicine | Admitting: Internal Medicine

## 2022-11-20 DIAGNOSIS — G4733 Obstructive sleep apnea (adult) (pediatric): Secondary | ICD-10-CM | POA: Diagnosis present

## 2022-11-20 DIAGNOSIS — E039 Hypothyroidism, unspecified: Secondary | ICD-10-CM | POA: Diagnosis not present

## 2022-11-20 DIAGNOSIS — K219 Gastro-esophageal reflux disease without esophagitis: Secondary | ICD-10-CM | POA: Diagnosis not present

## 2022-11-20 DIAGNOSIS — I10 Essential (primary) hypertension: Secondary | ICD-10-CM | POA: Diagnosis not present

## 2022-11-20 DIAGNOSIS — I110 Hypertensive heart disease with heart failure: Secondary | ICD-10-CM | POA: Diagnosis not present

## 2022-11-20 DIAGNOSIS — F419 Anxiety disorder, unspecified: Secondary | ICD-10-CM | POA: Diagnosis present

## 2022-11-20 DIAGNOSIS — I358 Other nonrheumatic aortic valve disorders: Secondary | ICD-10-CM | POA: Diagnosis present

## 2022-11-20 DIAGNOSIS — Z87440 Personal history of urinary (tract) infections: Secondary | ICD-10-CM

## 2022-11-20 DIAGNOSIS — I4892 Unspecified atrial flutter: Secondary | ICD-10-CM | POA: Diagnosis not present

## 2022-11-20 DIAGNOSIS — M47812 Spondylosis without myelopathy or radiculopathy, cervical region: Secondary | ICD-10-CM | POA: Diagnosis not present

## 2022-11-20 DIAGNOSIS — Z6838 Body mass index (BMI) 38.0-38.9, adult: Secondary | ICD-10-CM | POA: Diagnosis not present

## 2022-11-20 DIAGNOSIS — E538 Deficiency of other specified B group vitamins: Secondary | ICD-10-CM | POA: Diagnosis not present

## 2022-11-20 DIAGNOSIS — R531 Weakness: Secondary | ICD-10-CM | POA: Diagnosis present

## 2022-11-20 DIAGNOSIS — I959 Hypotension, unspecified: Principal | ICD-10-CM | POA: Diagnosis present

## 2022-11-20 DIAGNOSIS — J449 Chronic obstructive pulmonary disease, unspecified: Secondary | ICD-10-CM | POA: Diagnosis not present

## 2022-11-20 DIAGNOSIS — E785 Hyperlipidemia, unspecified: Secondary | ICD-10-CM | POA: Diagnosis not present

## 2022-11-20 DIAGNOSIS — E782 Mixed hyperlipidemia: Secondary | ICD-10-CM | POA: Diagnosis not present

## 2022-11-20 DIAGNOSIS — M19012 Primary osteoarthritis, left shoulder: Secondary | ICD-10-CM | POA: Diagnosis not present

## 2022-11-20 DIAGNOSIS — Z9181 History of falling: Secondary | ICD-10-CM | POA: Diagnosis not present

## 2022-11-20 DIAGNOSIS — E876 Hypokalemia: Secondary | ICD-10-CM | POA: Diagnosis not present

## 2022-11-20 DIAGNOSIS — F0393 Unspecified dementia, unspecified severity, with mood disturbance: Secondary | ICD-10-CM | POA: Diagnosis not present

## 2022-11-20 DIAGNOSIS — R42 Dizziness and giddiness: Secondary | ICD-10-CM | POA: Diagnosis not present

## 2022-11-20 DIAGNOSIS — I4891 Unspecified atrial fibrillation: Secondary | ICD-10-CM | POA: Diagnosis not present

## 2022-11-20 DIAGNOSIS — Z888 Allergy status to other drugs, medicaments and biological substances status: Secondary | ICD-10-CM

## 2022-11-20 DIAGNOSIS — I6782 Cerebral ischemia: Secondary | ICD-10-CM | POA: Diagnosis not present

## 2022-11-20 DIAGNOSIS — F339 Major depressive disorder, recurrent, unspecified: Secondary | ICD-10-CM | POA: Diagnosis not present

## 2022-11-20 DIAGNOSIS — Z885 Allergy status to narcotic agent status: Secondary | ICD-10-CM

## 2022-11-20 DIAGNOSIS — Y92009 Unspecified place in unspecified non-institutional (private) residence as the place of occurrence of the external cause: Secondary | ICD-10-CM | POA: Diagnosis not present

## 2022-11-20 DIAGNOSIS — F331 Major depressive disorder, recurrent, moderate: Secondary | ICD-10-CM | POA: Diagnosis not present

## 2022-11-20 DIAGNOSIS — H6123 Impacted cerumen, bilateral: Secondary | ICD-10-CM | POA: Diagnosis not present

## 2022-11-20 DIAGNOSIS — R4182 Altered mental status, unspecified: Principal | ICD-10-CM

## 2022-11-20 DIAGNOSIS — S0990XA Unspecified injury of head, initial encounter: Secondary | ICD-10-CM | POA: Diagnosis not present

## 2022-11-20 DIAGNOSIS — F0394 Unspecified dementia, unspecified severity, with anxiety: Secondary | ICD-10-CM | POA: Diagnosis present

## 2022-11-20 DIAGNOSIS — Z87891 Personal history of nicotine dependence: Secondary | ICD-10-CM

## 2022-11-20 DIAGNOSIS — I2510 Atherosclerotic heart disease of native coronary artery without angina pectoris: Secondary | ICD-10-CM | POA: Diagnosis present

## 2022-11-20 DIAGNOSIS — Z79899 Other long term (current) drug therapy: Secondary | ICD-10-CM

## 2022-11-20 DIAGNOSIS — R29898 Other symptoms and signs involving the musculoskeletal system: Secondary | ICD-10-CM | POA: Diagnosis not present

## 2022-11-20 DIAGNOSIS — Z96651 Presence of right artificial knee joint: Secondary | ICD-10-CM | POA: Diagnosis present

## 2022-11-20 DIAGNOSIS — K5900 Constipation, unspecified: Secondary | ICD-10-CM | POA: Diagnosis not present

## 2022-11-20 DIAGNOSIS — E559 Vitamin D deficiency, unspecified: Secondary | ICD-10-CM | POA: Diagnosis not present

## 2022-11-20 DIAGNOSIS — W19XXXA Unspecified fall, initial encounter: Secondary | ICD-10-CM | POA: Diagnosis not present

## 2022-11-20 DIAGNOSIS — R5381 Other malaise: Principal | ICD-10-CM | POA: Diagnosis present

## 2022-11-20 DIAGNOSIS — Z043 Encounter for examination and observation following other accident: Secondary | ICD-10-CM | POA: Diagnosis not present

## 2022-11-20 DIAGNOSIS — Z9851 Tubal ligation status: Secondary | ICD-10-CM

## 2022-11-20 DIAGNOSIS — I484 Atypical atrial flutter: Secondary | ICD-10-CM | POA: Diagnosis not present

## 2022-11-20 DIAGNOSIS — H04123 Dry eye syndrome of bilateral lacrimal glands: Secondary | ICD-10-CM | POA: Diagnosis not present

## 2022-11-20 DIAGNOSIS — T502X5A Adverse effect of carbonic-anhydrase inhibitors, benzothiadiazides and other diuretics, initial encounter: Secondary | ICD-10-CM | POA: Diagnosis present

## 2022-11-20 DIAGNOSIS — N3281 Overactive bladder: Secondary | ICD-10-CM | POA: Diagnosis not present

## 2022-11-20 DIAGNOSIS — Z7901 Long term (current) use of anticoagulants: Secondary | ICD-10-CM | POA: Diagnosis not present

## 2022-11-20 DIAGNOSIS — I7143 Infrarenal abdominal aortic aneurysm, without rupture: Secondary | ICD-10-CM | POA: Diagnosis not present

## 2022-11-20 DIAGNOSIS — Z743 Need for continuous supervision: Secondary | ICD-10-CM | POA: Diagnosis not present

## 2022-11-20 DIAGNOSIS — R296 Repeated falls: Secondary | ICD-10-CM | POA: Diagnosis present

## 2022-11-20 DIAGNOSIS — Z79890 Hormone replacement therapy (postmenopausal): Secondary | ICD-10-CM

## 2022-11-20 LAB — URINALYSIS, ROUTINE W REFLEX MICROSCOPIC
Bilirubin Urine: NEGATIVE
Glucose, UA: NEGATIVE mg/dL
Hgb urine dipstick: NEGATIVE
Ketones, ur: 5 mg/dL — AB
Leukocytes,Ua: NEGATIVE
Nitrite: NEGATIVE
Protein, ur: NEGATIVE mg/dL
Specific Gravity, Urine: 1.009 (ref 1.005–1.030)
pH: 7 (ref 5.0–8.0)

## 2022-11-20 LAB — CBC
HCT: 44.5 % (ref 36.0–46.0)
Hemoglobin: 14.7 g/dL (ref 12.0–15.0)
MCH: 33.4 pg (ref 26.0–34.0)
MCHC: 33 g/dL (ref 30.0–36.0)
MCV: 101.1 fL — ABNORMAL HIGH (ref 80.0–100.0)
Platelets: 263 10*3/uL (ref 150–400)
RBC: 4.4 MIL/uL (ref 3.87–5.11)
RDW: 13.5 % (ref 11.5–15.5)
WBC: 10.1 10*3/uL (ref 4.0–10.5)
nRBC: 0 % (ref 0.0–0.2)

## 2022-11-20 LAB — BASIC METABOLIC PANEL
Anion gap: 9 (ref 5–15)
BUN: 23 mg/dL (ref 8–23)
CO2: 31 mmol/L (ref 22–32)
Calcium: 8.4 mg/dL — ABNORMAL LOW (ref 8.9–10.3)
Chloride: 96 mmol/L — ABNORMAL LOW (ref 98–111)
Creatinine, Ser: 0.97 mg/dL (ref 0.44–1.00)
GFR, Estimated: >60 mL/min (ref >60)
Glucose, Bld: 95 mg/dL (ref 70–99)
Potassium: 2.9 mmol/L — ABNORMAL LOW (ref 3.5–5.1)
Sodium: 136 mmol/L (ref 135–145)

## 2022-11-20 LAB — CK: Total CK: 23 U/L — ABNORMAL LOW (ref 38–234)

## 2022-11-20 LAB — TROPONIN I (HIGH SENSITIVITY): Troponin I (High Sensitivity): 5 ng/L (ref <18)

## 2022-11-20 LAB — MAGNESIUM: Magnesium: 2.3 mg/dL (ref 1.7–2.4)

## 2022-11-20 MED ORDER — SODIUM CHLORIDE 0.9 % IV SOLN: INTRAVENOUS | Status: AC

## 2022-11-20 MED ORDER — MORPHINE SULFATE (PF) 2 MG/ML IV SOLN: 2.0000 mg | INTRAVENOUS | Status: DC | PRN

## 2022-11-20 MED ORDER — HYDROCODONE-ACETAMINOPHEN 5-325 MG PO TABS
1.0000 | ORAL_TABLET | ORAL | Status: DC | PRN
  Administered 2022-11-25 – 2022-11-27 (×3): 1 via ORAL
  Filled 2022-11-20 (×3): qty 1

## 2022-11-20 MED ORDER — LEVOTHYROXINE SODIUM 88 MCG PO TABS
88.0000 ug | ORAL_TABLET | Freq: Every day | ORAL | Status: DC
  Administered 2022-11-21: 88 ug via ORAL
  Filled 2022-11-20: qty 1

## 2022-11-20 MED ORDER — ACETAMINOPHEN 325 MG PO TABS
650.0000 mg | ORAL_TABLET | Freq: Four times a day (QID) | ORAL | Status: DC | PRN
  Administered 2022-11-28: 650 mg via ORAL
  Filled 2022-11-20: qty 2

## 2022-11-20 MED ORDER — METOPROLOL TARTRATE 5 MG/5ML IV SOLN
5.0000 mg | Freq: Once | INTRAVENOUS | Status: AC
  Administered 2022-11-20: 5 mg via INTRAVENOUS
  Filled 2022-11-20: qty 5

## 2022-11-20 MED ORDER — ACETAMINOPHEN 650 MG RE SUPP: 650.0000 mg | Freq: Four times a day (QID) | RECTAL | Status: DC | PRN

## 2022-11-20 MED ORDER — SODIUM CHLORIDE 0.9 % IV BOLUS
500.0000 mL | Freq: Once | INTRAVENOUS | Status: AC
  Administered 2022-11-20: 500 mL via INTRAVENOUS

## 2022-11-20 MED ORDER — SODIUM CHLORIDE 0.9% FLUSH
3.0000 mL | Freq: Two times a day (BID) | INTRAVENOUS | Status: DC
  Administered 2022-11-21 – 2022-11-30 (×19): 3 mL via INTRAVENOUS

## 2022-11-20 MED ORDER — HEPARIN SODIUM (PORCINE) 5000 UNIT/ML IJ SOLN
5000.0000 [IU] | Freq: Three times a day (TID) | INTRAMUSCULAR | Status: DC
  Administered 2022-11-20: 5000 [IU] via SUBCUTANEOUS
  Filled 2022-11-20: qty 1

## 2022-11-20 MED ORDER — TRAZODONE HCL 100 MG PO TABS
100.0000 mg | ORAL_TABLET | Freq: Every day | ORAL | Status: DC
  Administered 2022-11-20: 100 mg via ORAL
  Filled 2022-11-20: qty 1

## 2022-11-20 MED ORDER — POTASSIUM CHLORIDE CRYS ER 20 MEQ PO TBCR
40.0000 meq | EXTENDED_RELEASE_TABLET | Freq: Once | ORAL | Status: AC
  Administered 2022-11-20: 40 meq via ORAL
  Filled 2022-11-20: qty 2

## 2022-11-20 NOTE — ED Notes (Signed)
Patient brought back from triage. Placed into ER stretcher, connected to monitor, VSS, call light within reach. Spouse at bedside.

## 2022-11-20 NOTE — ED Notes (Signed)
Patient back from MRI. Reconnected to monitor. VSS, call light within reach. Patient requesting home trazodone. Notified MD.

## 2022-11-20 NOTE — ED Notes (Signed)
Patient taken to MRI at this time 

## 2022-11-20 NOTE — ED Provider Notes (Signed)
[facility] Provider Note    Event Date/Time   First MD Initiated Contact with Patient 11/20/22 1510     (approximate)   History   Chief Complaint Fall   HPI  Robin Espinoza is a 77 y.o. female with past medical history of hypertension, hyperlipidemia, atrial fibrillation on Eliquis, and hypothyroidism who presents to the ED following fall.  Husband reports that patient has been weak and confused for about the past 4 days.  She was seen by her PCP for this and started on antibiotics for presumed UTI.  Patient states she then had a fall yesterday where she tripped on something and fell backwards, striking her head but not losing consciousness.  She initially refused evaluation in the ED, but husband reports she was more confused with difficulty walking today, so he brought her to the ED.  Patient denies any fevers, cough, chest pain, shortness of breath, nausea, vomiting, diarrhea, or dysuria.  She reports chronic weakness in her right leg, denies any new focal numbness or weakness.     Physical Exam   Triage Vital Signs: ED Triage Vitals  Encounter Vitals Group     BP 11/20/22 1321 (!) 89/57     Systolic BP Percentile --      Diastolic BP Percentile --      Pulse Rate 11/20/22 1321 68     Resp 11/20/22 1321 17     Temp 11/20/22 1326 97.7 F (36.5 C)     Temp Source 11/20/22 1326 Oral     SpO2 11/20/22 1321 98 %     Weight --      Height --      Head Circumference --      Peak Flow --      Pain Score 11/20/22 1321 0     Pain Loc --      Pain Education --      Exclude from Growth Chart --     Most recent vital signs: Vitals:   11/20/22 1645 11/20/22 1700  BP:  (!) 136/107  Pulse: (!) 171   Resp: 12 15  Temp:    SpO2: 99%     Constitutional: Alert and oriented. Eyes: Conjunctivae are normal. Head: Atraumatic. Nose: No congestion/rhinnorhea. Mouth/Throat: Mucous membranes are moist.  Neck: No midline cervical spine tenderness to  palpation. Cardiovascular: Tachycardic, irregularly irregular rhythm. Grossly normal heart sounds.  2+ radial pulses bilaterally. Respiratory: Normal respiratory effort.  No retractions. Lungs CTAB. Gastrointestinal: Soft and nontender. No distention. Musculoskeletal: No lower extremity tenderness nor edema.  Neurologic:  Normal speech and language. No gross focal neurologic deficits are appreciated.    ED Results / Procedures / Treatments   Labs (all labs ordered are listed, but only abnormal results are displayed) Labs Reviewed  BASIC METABOLIC PANEL - Abnormal; Notable for the following components:      Result Value   Potassium 2.9 (*)    Chloride 96 (*)    Calcium 8.4 (*)    All other components within normal limits  CBC - Abnormal; Notable for the following components:   MCV 101.1 (*)    All other components within normal limits  URINALYSIS, ROUTINE W REFLEX MICROSCOPIC - Abnormal; Notable for the following components:   Color, Urine YELLOW (*)    APPearance HAZY (*)    Ketones, ur 5 (*)    All other components within normal limits  MAGNESIUM  CBG MONITORING, ED  TROPONIN I (HIGH SENSITIVITY)  EKG  ED ECG REPORT I, Chesley Noon, the attending physician, personally viewed and interpreted this ECG.   Date: 11/20/2022  EKG Time: 13:23  Rate: 97  Rhythm: atrial fibrillation  Axis: RAD  Intervals:none  ST&T Change: None  RADIOLOGY CT head reviewed and interpreted by me with no hemorrhage or midline shift.  PROCEDURES:  Critical Care performed: No  Procedures   MEDICATIONS ORDERED IN ED: Medications  potassium chloride SA (KLOR-CON M) CR tablet 40 mEq (has no administration in time range)  metoprolol tartrate (LOPRESSOR) injection 5 mg (has no administration in time range)  sodium chloride 0.9 % bolus 500 mL (0 mLs Intravenous Stopped 11/20/22 1602)     IMPRESSION / MDM / ASSESSMENT AND PLAN / ED COURSE  I reviewed the triage vital signs and the  nursing notes.                              77 y.o. female with past medical history of hypertension, hyperlipidemia, hypothyroidism, and atrial fibrillation on Eliquis who presents to the ED with increasing weakness and confusion this week with a fall yesterday when she struck her head.  Patient's presentation is most consistent with acute presentation with potential threat to life or bodily function.  Differential diagnosis includes, but is not limited to, intracranial injury, cervical spine injury, UTI, stroke, anemia, electrolyte abnormality, AKI.  Patient nontoxic-appearing and in no acute distress, vital signs remarkable for tachycardia with initially borderline low BP, but improved following 500 cc IV fluid bolus.  On my assessment, she is alert and oriented with no focal neurologic deficits, does appear to be in atrial fibrillation with RVR, heart rates in the 110s.  No ischemic changes noted on EKG, troponin within normal limits.  Will give IV metoprolol for rate control.  Labs without significant anemia or leukocytosis, do show hypokalemia which we will replete, no evidence of AKI.  Magnesium level within normal limits.  Urinalysis shows no signs of infection.  No clear explanation for patient's acute weakness and confusion, but I would be concerned for stroke given atrial fibrillation.  We will order MRI and given her inability to walk, case discussed with hospitalist for admission.      FINAL CLINICAL IMPRESSION(S) / ED DIAGNOSES   Final diagnoses:  Altered mental status, unspecified altered mental status type  Generalized weakness  Atrial fibrillation, unspecified type (HCC)     Rx / DC Orders   ED Discharge Orders     None        Note:  This document was prepared using Dragon voice recognition software and may include unintentional dictation errors.   Chesley Noon, MD 11/20/22 [id number]

## 2022-11-20 NOTE — ED Notes (Signed)
Pt cleaned of urine and assisted to recliner w/ assistance from 4 staff. Pt given warm blankets and hospital gown. Pt is cool and clammy.

## 2022-11-20 NOTE — H&P (Signed)
History and Physical    Patient: Robin Espinoza ZOX:[id number] DOB: 1946-01-12 DOA: 11/20/2022 DOS: the patient was seen and examined on 11/21/2022 PCP: Lauro Regulus, MD  Patient coming from: Home   Chief Complaint:  Chief Complaint  Patient presents with   Fall    HPI: Robin Espinoza is a 77 y.o. female with medical history significant for CAD, hypertension, hypothyroidism, falls, GERD, obesity, obstructive sleep apnea, depression, anxiety presenting with a fall at home unresponsive.  Vital does report hitting her head, but no loss of consciousness.  Patient states that she has been dizzy off and on, including a while.  On initial presentation husband at bedside reported that patient has been weak and intermittently confused over the past 4 days that has been worse over the past 4 days.  Patient was seen by PCP and started on antibiotics for UTI.  Urinalysis today is normal.  Emergency room patient's initial vitals showed that she was hypotensive with systolic of 89 patient states that her husband is having difficulty taking care of her.  Overall patient is not in any pain and does not have any chest pain shortness of breath palpitations headache speech or any vision issues no nausea vomiting or diarrhea. EKG shows atrial flutter 97, right axis deviation, poor voltage.  But appears patient needed assistance and had a difficult time pulling herself up in bed. Lab work shows potassium of 2.9 normal creatinine normal liver is normal magnesium CPK 23, CBC is within normal limits except for MCV of 101.1. Urinalysis is hazy and yellow. CT imaging of spine head within normal limits. Pt last echo in June 2024 shows: 1. Left ventricular ejection fraction, by estimation, is 55 to 60% . The left ventricle has normal function. Left ventricular endocardial border not optimally defined to evaluate regional wall motion. There is moderate left ventricular hypertrophy. Left ventricular diastolic parameters are  indeterminate. 2. Right ventricular systolic function is low normal. The right ventricular size is normal. Mildly increased right ventricular wall thickness. Tricuspid regurgitation signal is inadequate for assessing PA pressure. 3. The mitral valve is normal in structure. Trivial mitral valve regurgitation. 4. The aortic valve has an indeterminant number of cusps. There is mild calcification of the aortic valve. There is mild thickening of the aortic valve. Aortic valve regurgitation is not visualized. No aortic stenosis is present.  Review of Systems: Review of Systems  Neurological:  Positive for weakness.  All other systems reviewed and are negative.   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 She has never used smokeless tobacco. She reports current alcohol use. She reports that  she does not use drugs.  Allergies  Allergen Reactions   Oxycodone Other (See Comments)    hallucinated   Amlodipine Swelling    Patient has been tolerating low dose    Family History  Problem Relation Age of Onset   Diabetes Father    Breast cancer Neg Hx     Prior to Admission medications   Medication Sig Start Date End Date Taking? Authorizing Provider  acetaminophen (TYLENOL) 650 MG CR  tablet Take 1,300 mg by mouth every 8 (eight) hours as needed for pain.    [provider]  amiodarone (PACERONE) 200 MG tablet Take 1 tablet (200 mg total) by mouth daily. 10/29/22   Sharon Seller, NP  apixaban (ELIQUIS) 5 MG TABS tablet Take 1 tablet (5 mg total) by mouth 2 (two) times daily. 10/29/22   Sharon Seller, NP  buPROPion (WELLBUTRIN XL) 300 MG 24 hr tablet Take 1 tablet (300 mg total) by mouth daily. 10/29/22   Sharon Seller, NP  Carboxymethylcellulose Sodium (THERATEARS OP) Place 1 drop into both eyes daily as needed (dry eyes).    [provider]  cyanocobalamin 1000 MCG tablet Take 1 tablet (1,000 mcg total) by mouth daily. 09/25/22   Loyce Dys, MD  furosemide (LASIX) 40 MG tablet Take 1 tablet (40 mg total) by mouth daily. 10/29/22   Sharon Seller, NP  gabapentin (NEURONTIN) 100 MG capsule Take 2 capsules (200 mg total) by mouth 2 (two) times daily. 10/29/22   Sharon Seller, NP  levothyroxine (SYNTHROID) 88 MCG tablet Take 1 tablet (88 mcg total) by mouth daily. 10/29/22   Sharon Seller, NP  lovastatin (MEVACOR) 40 MG tablet Take 1 tablet (40 mg total) by mouth at bedtime. 10/29/22   Sharon Seller, NP  metoprolol succinate (TOPROL-XL) 50 MG 24 hr tablet Take 1 tablet (50 mg total) by mouth daily. Take with or immediately following a meal. 10/29/22   Sharon Seller, NP  NON FORMULARY CPAP 08/04/06   [provider]  nystatin cream (MYCOSTATIN) Apply 1 Application topically 2 (two) times daily.    [provider]  omeprazole (PRILOSEC) 20 MG capsule TAKE 1 CAPSULE BY MOUTH EVERY DAY 10/29/22   Sharon Seller, NP  polyethylene glycol (MIRALAX / GLYCOLAX) 17 g packet Take 17 g by mouth daily as needed.    [provider]  senna (SENOKOT) 8.6 MG tablet Take 2 tablets (17.2 mg total) by mouth at bedtime. 01/30/21   Jacky Kindle, FNP  traZODone (DESYREL) 100 MG tablet Take 1 tablet (100 mg total) by mouth daily.  10/29/22   Sharon Seller, NP  Vitamin D3 (VITAMIN D) 25 MCG tablet Take 1,000 Units by mouth daily.    [provider]  Zinc Oxide (TRIPLE PASTE) 12.8 % ointment Apply 1 Application topically. To buttocks every shift.    [provider]     Vitals:   11/20/22 2300 11/21/22 0000 11/21/22 0047 11/21/22 0100  BP: (!) 132/117 95/79  95/78  Pulse:      Resp: 14 14 19 15   Temp:   97.6 F (36.4 C)   TempSrc:   Oral   SpO2:      Weight:      Height:       Physical Exam Vitals and nursing note reviewed.  Constitutional:      General: She is not in acute distress.    Appearance: She is obese.  HENT:  Head: Normocephalic and atraumatic.     Right Ear: Hearing normal.     Left Ear: Hearing normal.     Nose: Nose normal. No nasal deformity.     Mouth/Throat:     Lips: Pink.     Tongue: No lesions.     Pharynx: Oropharynx is clear.  Eyes:     General: Lids are normal.     Extraocular Movements: Extraocular movements intact.  Cardiovascular:     Rate and Rhythm: Normal rate and regular rhythm.     Heart sounds: Normal heart sounds.  Pulmonary:     Effort: Pulmonary effort is normal.     Breath sounds: Normal breath sounds.  Abdominal:     General: Bowel sounds are normal. There is no distension.     Palpations: Abdomen is soft. There is no mass.     Tenderness: There is no abdominal tenderness.  Musculoskeletal:     Right lower leg: No edema.     Left lower leg: No edema.  Skin:    General: Skin is warm.  Neurological:     General: No focal deficit present.     Mental Status: She is alert and oriented to person, place, and time.     Cranial Nerves: Cranial nerves 2-12 are intact.     Motor: Weakness present.  Psychiatric:        Attention and Perception: Attention normal.        Mood and Affect: Mood normal.        Speech: Speech normal.        Behavior: Behavior normal. Behavior is cooperative.     Labs on Admission: I have personally reviewed  following labs and imaging studies  CBC: Recent Labs  Lab 11/20/22 1326  WBC 10.1  HGB 14.7  HCT 44.5  MCV 101.1*  PLT 263   Basic Metabolic Panel: Recent Labs  Lab 11/20/22 1326  NA 136  K 2.9*  CL 96*  CO2 31  GLUCOSE 95  BUN 23  CREATININE 0.97  CALCIUM 8.4*  MG 2.3   GFR: Estimated Creatinine Clearance: 58.1 mL/min (by C-G formula based on SCr of 0.97 mg/dL). Liver Function Tests: No results for input(s): "AST", "ALT", "ALKPHOS", "BILITOT", "PROT", "ALBUMIN" in the last 168 hours. No results for input(s): "LIPASE", "AMYLASE" in the last 168 hours. No results for input(s): "AMMONIA" in the last 168 hours. Coagulation Profile: No results for input(s): "INR", "PROTIME" in the last 168 hours. Cardiac Enzymes: Recent Labs  Lab 11/20/22 1326  CKTOTAL 23*   BNP (last 3 results) No results for input(s): "PROBNP" in the last 8760 hours. HbA1C: No results for input(s): "HGBA1C" in the last 72 hours. CBG: No results for input(s): "GLUCAP" in the last 168 hours. Lipid Profile: No results for input(s): "CHOL", "HDL", "LDLCALC", "TRIG", "CHOLHDL", "LDLDIRECT" in the last 72 hours. Thyroid Function Tests: No results for input(s): "TSH", "T4TOTAL", "FREET4", "T3FREE", "THYROIDAB" in the last 72 hours. Anemia Panel: No results for input(s): "VITAMINB12", "FOLATE", "FERRITIN", "TIBC", "IRON", "RETICCTPCT" in the last 72 hours. Urinalysis    Component Value Date/Time   COLORURINE YELLOW (A) 11/20/2022 1619   APPEARANCEUR HAZY (A) 11/20/2022 1619   APPEARANCEUR Hazy (A) 06/22/2022 1446   LABSPEC 1.009 11/20/2022 1619   LABSPEC 1.020 05/02/2012 1036   PHURINE 7.0 11/20/2022 1619   GLUCOSEU NEGATIVE 11/20/2022 1619   GLUCOSEU Negative 05/02/2012 1036   HGBUR NEGATIVE 11/20/2022 1619   BILIRUBINUR NEGATIVE 11/20/2022 1619   BILIRUBINUR Negative 06/22/2022  1446   BILIRUBINUR Negative 05/02/2012 1036   KETONESUR 5 (A) 11/20/2022 1619   PROTEINUR NEGATIVE 11/20/2022 1619    UROBILINOGEN 0.2 08/22/2019 1605   NITRITE NEGATIVE 11/20/2022 1619   LEUKOCYTESUR NEGATIVE 11/20/2022 1619   LEUKOCYTESUR 3+ 05/02/2012 1036   Medications  levothyroxine (SYNTHROID) tablet 88 mcg (has no administration in time range)  sodium chloride flush (NS) 0.9 % injection 3 mL ( Intravenous Canceled Entry 11/20/22 2128)  0.9 %  sodium chloride infusion ( Intravenous New Bag/Given 11/20/22 2135)  acetaminophen (TYLENOL) tablet 650 mg (has no administration in time range)    Or  acetaminophen (TYLENOL) suppository 650 mg (has no administration in time range)  HYDROcodone-acetaminophen (NORCO/VICODIN) 5-325 MG per tablet 1 tablet (has no administration in time range)  apixaban (ELIQUIS) tablet 5 mg (5 mg Oral Given 11/21/22 0042)  buPROPion (WELLBUTRIN XL) 24 hr tablet 300 mg (has no administration in time range)  gabapentin (NEURONTIN) capsule 200 mg (200 mg Oral Given 11/21/22 0042)  traZODone (DESYREL) tablet 100 mg (100 mg Oral Given 11/21/22 0042)  metoprolol succinate (TOPROL-XL) 24 hr tablet 12.5 mg (has no administration in time range)  amiodarone (PACERONE) tablet 100 mg (has no administration in time range)  morphine (PF) 2 MG/ML injection 2 mg (has no administration in time range)  sodium chloride 0.9 % bolus 500 mL (0 mLs Intravenous Stopped 11/20/22 1602)  potassium chloride SA (KLOR-CON M) CR tablet 40 mEq (40 mEq Oral Given 11/20/22 1731)  metoprolol tartrate (LOPRESSOR) injection 5 mg (5 mg Intravenous Given 11/20/22 1732)    Radiological Exams on Admission: MR BRAIN WO CONTRAST  Result Date: 11/20/2022 CLINICAL DATA:  Fall EXAM: MRI HEAD WITHOUT CONTRAST TECHNIQUE: Multiplanar, multiecho pulse sequences of the brain and surrounding structures were obtained without intravenous contrast. COMPARISON:  None Available. FINDINGS: Brain: No acute infarct, mass effect or extra-axial collection. No acute or chronic hemorrhage. There is multifocal hyperintense T2-weighted signal  within the white matter. Parenchymal volume and CSF spaces are normal. The midline structures are normal. Vascular: Major flow voids are preserved. Skull and upper cervical spine: Normal calvarium and skull base. Visualized upper cervical spine and soft tissues are normal. Sinuses/Orbits:No paranasal sinus fluid levels or advanced mucosal thickening. No mastoid or middle ear effusion. Normal orbits. IMPRESSION: 1. No acute intracranial abnormality. 2. Findings of chronic [place] vessel ischemia. Electronically Signed   By: Deatra Robinson M.D.   On: 11/20/2022 20:40   CT Cervical Spine Wo Contrast  Result Date: 11/20/2022 CLINICAL DATA:  Larey Seat yesterday, hit head EXAM: CT CERVICAL SPINE WITHOUT CONTRAST TECHNIQUE: Multidetector CT imaging of the cervical spine was performed without intravenous contrast. Multiplanar CT image reconstructions were also generated. RADIATION DOSE REDUCTION: This exam was performed according to the departmental dose-optimization program which includes automated exposure control, adjustment of the mA and/or kV according to patient size and/or use of iterative reconstruction technique. COMPARISON:  09/16/2022 FINDINGS: Alignment: Alignment is anatomic. Skull base and vertebrae: No acute fracture. No primary bone lesion or focal pathologic process. Soft tissues and spinal canal: No prevertebral fluid or swelling. No visible canal hematoma. Disc levels: Stable multilevel spondylosis and facet hypertrophy, most pronounced from C3-4 through C5-6. Stable neural foraminal encroachment at the C3-4 and C4-5 levels. Upper chest: Airway is patent.  Lung apices are clear. Other: Reconstructed images demonstrate no additional findings. IMPRESSION: 1. Stable cervical degenerative changes.  No acute fracture. Electronically Signed   By: Sharlet Salina M.D.   On: 11/20/2022 15:56  CT Head Wo Contrast  Result Date: 11/20/2022 CLINICAL DATA:  Larey Seat yesterday, hit head EXAM: CT HEAD WITHOUT CONTRAST  TECHNIQUE: Contiguous axial images were obtained from the base of the skull through the vertex without intravenous contrast. RADIATION DOSE REDUCTION: This exam was performed according to the departmental dose-optimization program which includes automated exposure control, adjustment of the mA and/or kV according to patient size and/or use of iterative reconstruction technique. COMPARISON:  09/16/2022 FINDINGS: Brain: No acute infarct or hemorrhage. Lateral ventricles and midline structures are unremarkable. No acute extra-axial fluid collections. No mass effect. Vascular: No hyperdense vessel or unexpected calcification. Skull: Normal. Negative for fracture or focal lesion. Sinuses/Orbits: No acute finding. Other: None. IMPRESSION: 1. No acute intracranial process. Electronically Signed   By: Sharlet Salina M.D.   On: 11/20/2022 15:54     Data Reviewed: Relevant notes from primary care and specialist visits, past discharge summaries as available in EHR, including Care Everywhere. Prior diagnostic testing as pertinent to current admission diagnoses Updated medications and problem lists for reconciliation ED course, including vitals, labs, imaging, treatment and response to treatment Triage notes, nursing and pharmacy notes and ED provider's notes Notable results as noted in HPI  Assessment and Plan: * Bilateral leg weakness Suspect due to leg weakness from hypokalemia. Pt will need STR. We will correct underlying electrolyte. Fall precaution.   Falls Due to hypotension suspect from hypovolemia and iatrogenic. Fall precautions.  Aggressive physical therapy and possibly short-term rehab.   Hypotension Patient had her amiodarone at 100 mg daily, decrease metoprolol dose from 15-12.5 daily.  Hold patient's Lasix.  Hypokalemia Secondary to diuretic therapy. Replace and follow levels.  Hypothyroidism Levothyroxine continued at 88 mcg.   Atrial flutter (HCC) Amiodarone continued at 100  along with metoprolol at 12.5. As blood pressure improves will resume her previous dose per a.m. team.  Essential hypertension Vitals:   11/20/22 1622 11/20/22 1700 11/20/22 1800 11/20/22 1830  BP: (!) 160/134 (!) 136/107 130/83 101/72   11/20/22 2036 11/20/22 2100 11/20/22 2130 11/20/22 2200  BP: 95/70 92/77 119/64 127/78   11/20/22 2230 11/20/22 2300 11/21/22 0000 11/21/22 0100  BP: (!) 122/99 (!) 132/117 95/79 95/78  Medications currently modified with decrease in the dose of metoprolol.    Obstructive apnea CPAP per home settings.  Anxiety disorder Will continue patient on trazodone and bupropion.   DVT prophylaxis:  Eliquis.   Consults:  None   Advance Care Planning:    Code Status: Full Code   Family Communication:  None   Disposition Plan:  STR.  Severity of Illness: The appropriate patient status for this patient is INPATIENT. Inpatient status is judged to be reasonable and necessary in order to provide the required intensity of service to ensure the patient's safety. The patient's presenting symptoms, physical exam findings, and initial radiographic and laboratory data in the context of their chronic comorbidities is felt to place them at high risk for further clinical deterioration. Furthermore, it is not anticipated that the patient will be medically stable for discharge from the hospital within 2 midnights of admission.   * I certify that at the point of admission it is my clinical judgment that the patient will require inpatient hospital care spanning beyond 2 midnights from the point of admission due to high intensity of service, high risk for further deterioration and high frequency of surveillance required.*  Author: Gertha Calkin, MD 11/21/2022 2:14 AM  For on call review [email or web address].

## 2022-11-20 NOTE — ED Triage Notes (Signed)
Arrives from home via [facility].  Fall yesterday in [place], hit head on tile. Patient refused transport yesterday. Family is concerned that patient has a concussion.  AOx4.  Patient c/o dizziness  20 right FA

## 2022-11-21 DIAGNOSIS — W19XXXA Unspecified fall, initial encounter: Secondary | ICD-10-CM | POA: Diagnosis present

## 2022-11-21 DIAGNOSIS — R4182 Altered mental status, unspecified: Secondary | ICD-10-CM

## 2022-11-21 DIAGNOSIS — I4891 Unspecified atrial fibrillation: Secondary | ICD-10-CM

## 2022-11-21 DIAGNOSIS — R531 Weakness: Secondary | ICD-10-CM

## 2022-11-21 DIAGNOSIS — R296 Repeated falls: Secondary | ICD-10-CM | POA: Diagnosis present

## 2022-11-21 DIAGNOSIS — I959 Hypotension, unspecified: Secondary | ICD-10-CM | POA: Diagnosis present

## 2022-11-21 LAB — COMPREHENSIVE METABOLIC PANEL
ALT: 15 U/L (ref 0–44)
AST: 18 U/L (ref 15–41)
Albumin: 3.2 g/dL — ABNORMAL LOW (ref 3.5–5.0)
Alkaline Phosphatase: 81 U/L (ref 38–126)
Anion gap: 10 (ref 5–15)
BUN: 17 mg/dL (ref 8–23)
CO2: 30 mmol/L (ref 22–32)
Calcium: 8.2 mg/dL — ABNORMAL LOW (ref 8.9–10.3)
Chloride: 99 mmol/L (ref 98–111)
Creatinine, Ser: 0.89 mg/dL (ref 0.44–1.00)
GFR, Estimated: >60 mL/min (ref >60)
Glucose, Bld: 88 mg/dL (ref 70–99)
Potassium: 3.2 mmol/L — ABNORMAL LOW (ref 3.5–5.1)
Sodium: 139 mmol/L (ref 135–145)
Total Bilirubin: 0.4 mg/dL (ref 0.3–1.2)
Total Protein: 6.5 g/dL (ref 6.5–8.1)

## 2022-11-21 LAB — CBC
HCT: 43.8 % (ref 36.0–46.0)
Hemoglobin: 14.1 g/dL (ref 12.0–15.0)
MCH: 32.9 pg (ref 26.0–34.0)
MCHC: 32.2 g/dL (ref 30.0–36.0)
MCV: 102.1 fL — ABNORMAL HIGH (ref 80.0–100.0)
Platelets: 183 10*3/uL (ref 150–400)
RBC: 4.29 MIL/uL (ref 3.87–5.11)
RDW: 13.6 % (ref 11.5–15.5)
WBC: 8.1 10*3/uL (ref 4.0–10.5)
nRBC: 0 % (ref 0.0–0.2)

## 2022-11-21 LAB — LACTIC ACID, PLASMA
Lactic Acid, Venous: 1.6 mmol/L (ref 0.5–1.9)
Lactic Acid, Venous: 2.5 mmol/L (ref 0.5–1.9)
Lactic Acid, Venous: 1.9 mmol/L (ref 0.5–1.9)

## 2022-11-21 LAB — TROPONIN I (HIGH SENSITIVITY): Troponin I (High Sensitivity): 5 ng/L (ref <18)

## 2022-11-21 LAB — TSH: TSH: 3.091 u[IU]/mL (ref 0.350–4.500)

## 2022-11-21 LAB — PHOSPHORUS: Phosphorus: 2.7 mg/dL (ref 2.5–4.6)

## 2022-11-21 LAB — SEDIMENTATION RATE: Sed Rate: 40 mm/hr — ABNORMAL HIGH (ref 0–30)

## 2022-11-21 LAB — HEMOGLOBIN A1C
Hgb A1c MFr Bld: 5.3 % (ref 4.8–5.6)
Mean Plasma Glucose: 105.41 mg/dL

## 2022-11-21 LAB — C-REACTIVE PROTEIN: CRP: 3.6 mg/dL — ABNORMAL HIGH (ref <1.0)

## 2022-11-21 LAB — T4, FREE: Free T4: 1.42 ng/dL — ABNORMAL HIGH (ref 0.61–1.12)

## 2022-11-21 LAB — MAGNESIUM: Magnesium: 2.1 mg/dL (ref 1.7–2.4)

## 2022-11-21 LAB — CBG MONITORING, ED: Glucose-Capillary: 84 mg/dL (ref 70–99)

## 2022-11-21 LAB — AMMONIA: Ammonia: <10 umol/L (ref 9–35)

## 2022-11-21 LAB — VITAMIN B12: Vitamin B-12: 1023 pg/mL — ABNORMAL HIGH (ref 180–914)

## 2022-11-21 MED ORDER — AMIODARONE HCL 200 MG PO TABS: 200.0000 mg | ORAL_TABLET | Freq: Every day | ORAL | Status: DC

## 2022-11-21 MED ORDER — METOPROLOL SUCCINATE ER 50 MG PO TB24: 50.0000 mg | ORAL_TABLET | Freq: Every day | ORAL | Status: DC

## 2022-11-21 MED ORDER — TRAZODONE HCL 50 MG PO TABS
100.0000 mg | ORAL_TABLET | Freq: Every day | ORAL | Status: DC
  Administered 2022-11-21 – 2022-11-29 (×10): 100 mg via ORAL
  Filled 2022-11-21 (×3): qty 2
  Filled 2022-11-21: qty 1
  Filled 2022-11-21 (×6): qty 2

## 2022-11-21 MED ORDER — LACTATED RINGERS IV BOLUS
250.0000 mL | Freq: Once | INTRAVENOUS | Status: AC
  Administered 2022-11-21: 250 mL via INTRAVENOUS

## 2022-11-21 MED ORDER — APIXABAN 5 MG PO TABS
5.0000 mg | ORAL_TABLET | Freq: Two times a day (BID) | ORAL | Status: DC
  Administered 2022-11-21 – 2022-11-30 (×20): 5 mg via ORAL
  Filled 2022-11-21 (×19): qty 1
  Filled 2022-11-21: qty 2

## 2022-11-21 MED ORDER — PANTOPRAZOLE SODIUM 40 MG IV SOLR
40.0000 mg | Freq: Two times a day (BID) | INTRAVENOUS | Status: DC
  Administered 2022-11-21 (×2): 40 mg via INTRAVENOUS
  Filled 2022-11-21 (×2): qty 10

## 2022-11-21 MED ORDER — DULOXETINE HCL 30 MG PO CPEP: 60.0000 mg | ORAL_CAPSULE | Freq: Every day | ORAL | Status: DC

## 2022-11-21 MED ORDER — METOPROLOL SUCCINATE ER 25 MG PO TB24
12.5000 mg | ORAL_TABLET | Freq: Every day | ORAL | Status: DC
  Administered 2022-11-21: 12.5 mg via ORAL
  Filled 2022-11-21: qty 1

## 2022-11-21 MED ORDER — DULOXETINE HCL 20 MG PO CPEP
20.0000 mg | ORAL_CAPSULE | Freq: Once | ORAL | Status: AC
  Administered 2022-11-21: 20 mg via ORAL
  Filled 2022-11-21: qty 1

## 2022-11-21 MED ORDER — LEVOTHYROXINE SODIUM 50 MCG PO TABS
50.0000 ug | ORAL_TABLET | Freq: Every day | ORAL | Status: DC
  Administered 2022-11-22 – 2022-11-30 (×9): 50 ug via ORAL
  Filled 2022-11-21 (×9): qty 1

## 2022-11-21 MED ORDER — AMIODARONE HCL 200 MG PO TABS
100.0000 mg | ORAL_TABLET | Freq: Every day | ORAL | Status: DC
  Administered 2022-11-21 – 2022-11-30 (×10): 100 mg via ORAL
  Filled 2022-11-21 (×11): qty 1

## 2022-11-21 MED ORDER — DULOXETINE HCL 30 MG PO CPEP
60.0000 mg | ORAL_CAPSULE | Freq: Every day | ORAL | Status: DC
  Administered 2022-11-21 – 2022-11-23 (×3): 60 mg via ORAL
  Filled 2022-11-21 (×3): qty 2

## 2022-11-21 MED ORDER — SODIUM CHLORIDE 0.9 % IV SOLN
2.0000 g | INTRAVENOUS | Status: DC
  Administered 2022-11-21: 2 g via INTRAVENOUS
  Filled 2022-11-21: qty 20

## 2022-11-21 MED ORDER — MORPHINE SULFATE (PF) 2 MG/ML IV SOLN: 2.0000 mg | Freq: Three times a day (TID) | INTRAVENOUS | Status: DC | PRN

## 2022-11-21 MED ORDER — GABAPENTIN 100 MG PO CAPS
200.0000 mg | ORAL_CAPSULE | Freq: Two times a day (BID) | ORAL | Status: DC
  Administered 2022-11-21 – 2022-11-30 (×20): 200 mg via ORAL
  Filled 2022-11-21 (×20): qty 2

## 2022-11-21 MED ORDER — POTASSIUM CHLORIDE CRYS ER 20 MEQ PO TBCR
20.0000 meq | EXTENDED_RELEASE_TABLET | ORAL | Status: AC
  Administered 2022-11-21 (×3): 20 meq via ORAL
  Filled 2022-11-21 (×3): qty 1

## 2022-11-21 MED ORDER — BUPROPION HCL ER (XL) 300 MG PO TB24
300.0000 mg | ORAL_TABLET | Freq: Every day | ORAL | Status: DC
  Administered 2022-11-21 – 2022-11-23 (×3): 300 mg via ORAL
  Filled 2022-11-21: qty 1
  Filled 2022-11-21: qty 2
  Filled 2022-11-21: qty 1

## 2022-11-21 NOTE — Progress Notes (Addendum)
Progress Note   Patient: Robin Espinoza WUJ:[id number] DOB: 1946/03/20 DOA: 11/20/2022     1 DOS: the patient was seen and examined on 11/21/2022   Brief hospital course: Robin Espinoza is a 77 y.o. female with medical history significant for CAD, hypertension, hypothyroidism, falls, GERD, obesity, obstructive sleep apnea, depression, anxiety presenting with a fall at home unresponsive.  Vital does report hitting her head, but no loss of consciousness.  Patient states that she has been dizzy off and on, including a while.  On initial presentation husband at bedside reported that patient has been weak and intermittently confused over the past 4 days that has been worse over the past 4 days.  Patient was seen by PCP and started on antibiotics for UTI.  Urinalysis today is normal.  Emergency room patient's initial vitals showed that she was hypotensive with systolic of 89 patient states that her husband is having difficulty taking care of her.  Overall patient is not in any pain and does not have any chest pain shortness of breath palpitations headache speech or any vision issues no nausea vomiting or diarrhea. EKG shows atrial flutter 97, right axis deviation, poor voltage.  But appears patient needed assistance and had a difficult time pulling herself up in bed. Lab work shows potassium of 2.9 normal creatinine normal liver is normal magnesium CPK 23, CBC is within normal limits except for MCV of 101.1. Urinalysis is hazy and yellow. CT imaging of spine head within normal limits. Pt last echo in June 2024 shows: 1. Left ventricular ejection fraction, by estimation, is 55 to 60% . The left ventricle has normal function. Left ventricular endocardial border not optimally defined to evaluate regional wall motion. There is moderate left ventricular hypertrophy. Left ventricular diastolic parameters are indeterminate. 2. Right ventricular systolic function is low normal. The right ventricular size is normal. Mildly  increased right ventricular wall thickness. Tricuspid regurgitation signal is inadequate for assessing PA pressure. 3. The mitral valve is normal in structure. Trivial mitral valve regurgitation. 4. The aortic valve has an indeterminant number of cusps. There is mild calcification of the aortic valve. There is mild thickening of the aortic valve. Aortic valve regurgitation is not visualized. No aortic stenosis is present.   Review of Systems: Review of Systems  Neurological:  Positive for weakness.  All other systems reviewed and are negative.   Assessment and Plan:  Falls Generalized weakness Presents to the ER for evaluation of weakness and frequent falls At baseline patient ambulates with a rolling walker and states that she had her walker when she fell.  She struck her head but denies any loss of consciousness and was able to call EMS.  She has a life alert. Place patient on fall precautions PT evaluation Patient was recently discharged from subacute rehab     History of A-fib Continue amiodarone for rate control Continue metoprolol at a decreased dose since patient was hypotensive on admission Continue Eliquis as primary prophylaxis for an acute stroke    Depression Continue bupropion and Cymbalta    Hypothyroidism Continue Synthroid but decrease dose to 50 mcg due to increased free T4 levels    Obesity Complicates overall prognosis and care Life modification and exercise has been discussed with patient in detail.   Hypokalemia Supplement potassium Check magnesium levels   History of UTI No pyuria and asymptomatic Discontinue Rocephin.     Subjective: Patient is seen and examined at bedside  Physical Exam: Vitals:   11/21/22 0800  11/21/22 0830 11/21/22 1030 11/21/22 1308  BP: 106/67 (!) 143/114 (!) 129/100 (!) 128/97  Pulse: (!) 103 (!) 103 (!) 104 (!) 104  Resp: 16 17 18 20   Temp:    98.2 F (36.8 C)  TempSrc:      SpO2: 100% 99% 100% 100%   Weight:      Height:        Vitals and nursing note reviewed.  Constitutional:      General: She is not in acute distress.    Appearance: She is obese.  HENT:     Head: Normocephalic and atraumatic.     Right Ear: Hearing normal.     Left Ear: Hearing normal.     Nose: Nose normal. No nasal deformity.     Mouth/Throat:     Lips: Pink.     Tongue: No lesions.     Pharynx: Oropharynx is clear.  Eyes:     General: Lids are normal.     Extraocular Movements: Extraocular movements intact.  Cardiovascular:     Rate and Rhythm: Normal rate and regular rhythm.     Heart sounds: Normal heart sounds.  Pulmonary:     Effort: Pulmonary effort is normal.     Breath sounds: Normal breath sounds.  Abdominal:     General: Bowel sounds are normal. There is no distension.     Palpations: Abdomen is soft. There is no mass.     Tenderness: There is no abdominal tenderness.  Musculoskeletal:     Right lower leg: No edema.     Left lower leg: No edema.  Skin:    General: Skin is warm.  Neurological:     General: No focal deficit present.     Mental Status: She is alert and oriented to person, place, and time.     Cranial Nerves: Cranial nerves 2-12 are intact.     Motor: Weakness present.  Psychiatric:        Attention and Perception: Attention normal.        Mood and Affect: Mood normal.        Speech: Speech normal.        Behavior: Behavior normal. Behavior is cooperative.     Data Reviewed: Free T4 is 1.42, potassium 3.2 There are no new results to review at this time.  Family Communication: Discussed patient's condition and plan of care with her daughter over the phone.  States that her mother is unable to live independently and requires long-term care in a skilled nursing facility.  States that her father has applied for Medicaid.  Will consult TOC  Disposition: Status is: Inpatient Remains inpatient appropriate because: Needs a safe discharge plan  Planned Discharge  Destination:  TBD    Time spent: 35 minutes  Author: Lucile Shutters, MD 11/21/2022 2:58 PM  For on call review [email or web address].

## 2022-11-21 NOTE — Assessment & Plan Note (Signed)
CPAP per home settings.   

## 2022-11-21 NOTE — Assessment & Plan Note (Signed)
Amiodarone continued at 100 along with metoprolol at 12.5. As blood pressure improves will resume her previous dose per a.m. team.

## 2022-11-21 NOTE — H&P (Signed)
Receive secure, chat from nurse about concern about patients mental status being changed or being different than earlier discussed with Nurse that we shouldn't was reported to be confused and was not confused on my exam. Patient was alert Oriented and appropriate. Patient was recently treated for UTI on outpatient basis currently your analysis temperature white count all within normal limits. Per ED doctor patient said that she felt like she was hallucinating. Patient did not report that to me. The nurse patient said that she was in [place] and was concerned about her confusion. Vitals reviewed blood pressure systolic 100, patient febrile. Alert awake following commands per nurse. Discussed with nurse about plan for: ESR, CRP, troponin, TSH, FT4, ammonia, blood cultures, urine cultures.  , swallow study, and a diet to be started cardiac diet. Rocephin 2 gm daily for presumed UTI.

## 2022-11-21 NOTE — Plan of Care (Signed)

## 2022-11-21 NOTE — Consult Note (Addendum)
PHARMACY CONSULT NOTE - ELECTROLYTES  Pharmacy Consult for Electrolyte Monitoring and Replacement   Recent Labs: Height: 5\' 5"  (165.1 cm) Weight: 104 kg (229 lb 4.5 oz) IBW/kg (Calculated) : 57 Estimated Creatinine Clearance: 63.3 mL/min (by C-G formula based on SCr of 0.89 mg/dL). Potassium (mmol/L)  Date Value  11/21/2022 3.2 (L)  05/03/2012 3.7   Magnesium (mg/dL)  Date Value  78/46/9629 2.1   Calcium (mg/dL)  Date Value  52/84/1324 8.2 (L)   Calcium, Total (mg/dL)  Date Value  40/12/2723 9.0   Albumin (g/dL)  Date Value  36/64/4034 3.2 (L)  01/30/2021 4.0  05/02/2012 3.4   Phosphorus (mg/dL)  Date Value  74/25/9563 2.7   Sodium  Date Value  11/21/2022 139 mmol/L  10/19/2022 141  05/03/2012 138 mmol/L   Corrected Ca: 9.6 mg/dL  Assessment  Robin Espinoza is a 77 y.o. female presenting with increasing weakness/confusion,  Afib w/ RVR. PMH significant for CAD/Afib. Pharmacy has been consulted to monitor and replace electrolytes.  Diet: Yes 8/30 >  MIVF:  N/A Pertinent medications: PTA: furosemide 40 mg daily  Goal of Therapy: K closer to 4, Mag 2 given cardiac hx  Plan:  PO KCL 20 mEq x 3 for total of 60 mEq Check BMP, Mg, with AM labs  Thank you for allowing pharmacy to be a part of this patient's care.  Sharen Hones, PharmD, BCPS Clinical Pharmacist   11/21/2022 9:11 AM

## 2022-11-21 NOTE — ED Notes (Signed)
Advised nurse that patient has ready bed 

## 2022-11-21 NOTE — Assessment & Plan Note (Signed)
Will continue patient on trazodone and bupropion.

## 2022-11-21 NOTE — ED Notes (Addendum)
This nurse went to check on the pt at this time when this nurse heard the IV pump going off. When this nurse walked into the room, the pt was pulling at the  call bell cord and asking why it was not working and why it not plugged. This nurse explained to pt that the cord is not to be pulled but rather the red button is pressed if pt needs a nurse. Pt also asked where she was and what the times is. This nurse reoriented the pt at this time.  Pt is oriented to person and time. Pt is disoriented to place and situation.  This nurse is not sure of pt's baseline.

## 2022-11-21 NOTE — Assessment & Plan Note (Signed)
Vitals:   11/20/22 1622 11/20/22 1700 11/20/22 1800 11/20/22 1830  BP: (!) 160/134 (!) 136/107 130/83 101/72   11/20/22 2036 11/20/22 2100 11/20/22 2130 11/20/22 2200  BP: 95/70 92/77 119/64 127/78   11/20/22 2230 11/20/22 2300 11/21/22 0000 11/21/22 0100  BP: (!) 122/99 (!) 132/117 95/79 95/78  Medications currently modified with decrease in the dose of metoprolol.

## 2022-11-21 NOTE — Assessment & Plan Note (Signed)
Patient had her amiodarone at 100 mg daily, decrease metoprolol dose from 15-12.5 daily.  Hold patient's Lasix.

## 2022-11-21 NOTE — Assessment & Plan Note (Signed)
Secondary to diuretic therapy. Replace and follow levels.

## 2022-11-21 NOTE — ED Notes (Signed)
Pt was able to eat about 85% of her breakfast and was able to feed herself.

## 2022-11-21 NOTE — Assessment & Plan Note (Signed)
Due to hypotension suspect from hypovolemia and iatrogenic. Fall precautions.  Aggressive physical therapy and possibly short-term rehab.

## 2022-11-21 NOTE — Progress Notes (Signed)
       CROSS COVER NOTE  NAME: Robin Espinoza MRN: [id number] DOB : [birth year]    Concern as stated by nurse / staff   77 y.o. female with medical history significant for CAD, hypertension, hypothyroidism, falls, GERD, obesity, obstructive sleep apnea, depression, anxiety presenting with a fall at home unresponsive. Patient hitting her head, but no loss of consciousness. Patient states that she has been dizzy off and on, including a while. On initial presentation husband at bedside reported that patient has been weak and intermittently confused over the past 4 days that has been worse over the past 4 days. Diagnosis - Bilateral leg weakness, Generalized weakness, Altered mental status, unspecified altered mental status type, Atrial fibrillation, unspecified type [facility]) Patient just had a moment of "seeing a man standing outside her room" lasted maybe 3 seconds and the man was no longer there. I was witness to this episode. patient also felt like her R leg was stuck to the bed and she couldn't move it, but when I walked into the room she was able to move it. In the note I saw where the ED physician noted hallucinations. But I wanted to make sure you are aware of this episode.      Pertinent findings on chart review:   Assessment and  Interventions   Assessment:  Possible hallucinations  Plan: No acute treatment indicated Continue to monitor for signs of delirium X X

## 2022-11-21 NOTE — ED Notes (Signed)
Swallow test is completed this time with no issues.

## 2022-11-21 NOTE — Assessment & Plan Note (Signed)
Suspect due to leg weakness from hypokalemia. Pt will need STR. We will correct underlying electrolyte. Fall precaution.

## 2022-11-21 NOTE — Assessment & Plan Note (Signed)
Levothyroxine continued at 88 mcg.

## 2022-11-22 DIAGNOSIS — R29898 Other symptoms and signs involving the musculoskeletal system: Secondary | ICD-10-CM | POA: Diagnosis not present

## 2022-11-22 LAB — BASIC METABOLIC PANEL
Anion gap: 7 (ref 5–15)
BUN: 18 mg/dL (ref 8–23)
CO2: 28 mmol/L (ref 22–32)
Calcium: 8.4 mg/dL — ABNORMAL LOW (ref 8.9–10.3)
Chloride: 102 mmol/L (ref 98–111)
Creatinine, Ser: 0.88 mg/dL (ref 0.44–1.00)
GFR, Estimated: >60 mL/min (ref >60)
Glucose, Bld: 94 mg/dL (ref 70–99)
Potassium: 3.6 mmol/L (ref 3.5–5.1)
Sodium: 137 mmol/L (ref 135–145)

## 2022-11-22 LAB — MAGNESIUM: Magnesium: 2.2 mg/dL (ref 1.7–2.4)

## 2022-11-22 MED ORDER — METOPROLOL SUCCINATE ER 50 MG PO TB24
50.0000 mg | ORAL_TABLET | Freq: Every day | ORAL | Status: DC
  Administered 2022-11-22 – 2022-11-23 (×2): 50 mg via ORAL
  Filled 2022-11-22 (×2): qty 1

## 2022-11-22 MED ORDER — POTASSIUM CHLORIDE CRYS ER 20 MEQ PO TBCR
20.0000 meq | EXTENDED_RELEASE_TABLET | ORAL | Status: AC
  Administered 2022-11-22 (×2): 20 meq via ORAL
  Filled 2022-11-22 (×2): qty 1

## 2022-11-22 MED ORDER — PANTOPRAZOLE SODIUM 40 MG PO TBEC
40.0000 mg | DELAYED_RELEASE_TABLET | Freq: Two times a day (BID) | ORAL | Status: DC
  Administered 2022-11-22 – 2022-11-30 (×17): 40 mg via ORAL
  Filled 2022-11-22 (×17): qty 1

## 2022-11-22 NOTE — Progress Notes (Signed)
Progress Note   Patient: Robin Espinoza DOB: 1945-10-26 DOA: 11/20/2022     2 DOS: the patient was seen and examined on 11/22/2022   Brief hospital course: Robin Espinoza is a 77 y.o. female with medical history significant for CAD, hypertension, hypothyroidism, falls, GERD, obesity, obstructive sleep apnea, depression, anxiety presenting with a fall at home unresponsive.  Vital does report hitting her head, but no loss of consciousness.  Patient states that she has been dizzy off and on, including a while.  On initial presentation husband at bedside reported that patient has been weak and intermittently confused over the past 4 days that has been worse over the past 4 days.  Patient was seen by PCP and started on antibiotics for UTI.  Urinalysis today is normal.  Emergency room patient's initial vitals showed that she was hypotensive with systolic of 89 patient states that her husband is having difficulty taking care of her.  Overall patient is not in any pain and does not have any chest pain shortness of breath palpitations headache speech or any vision issues no nausea vomiting or diarrhea. EKG shows atrial flutter 97, right axis deviation, poor voltage.  But appears patient needed assistance and had a difficult time pulling herself up in bed. Lab work shows potassium of 2.9 normal creatinine normal liver is normal magnesium CPK 23, CBC is within normal limits except for MCV of 101.1. Urinalysis is hazy and yellow. CT imaging of spine head within normal limits. Pt last echo in June 2024 shows: 1. Left ventricular ejection fraction, by estimation, is 55 to 60% . The left ventricle has normal function. Left ventricular endocardial border not optimally defined to evaluate regional wall motion. There is moderate left ventricular hypertrophy. Left ventricular diastolic parameters are indeterminate. 2. Right ventricular systolic function is low normal. The right ventricular size is normal. Mildly  increased right ventricular wall thickness. Tricuspid regurgitation signal is inadequate for assessing PA pressure. 3. The mitral valve is normal in structure. Trivial mitral valve regurgitation. 4. The aortic valve has an indeterminant number of cusps. There is mild calcification of the aortic valve. There is mild thickening of the aortic valve. Aortic valve regurgitation is not visualized. No aortic stenosis is present.     Assessment and Plan: Falls Generalized weakness Presents to the ER for evaluation of weakness and frequent falls At baseline patient ambulates with a rolling walker and states that she had her walker when she fell.  She struck her head but denies any loss of consciousness and was able to call EMS.  She has a life alert. Continue to monitor fall precautions Appreciate PT input.  They recommend return to skilled nursing facility at discharge. Patient was recently discharged from subacute rehab TOC consult       History of A-fib Continue amiodarone and metoprolol for rate control Continue Eliquis as primary prophylaxis for an acute stroke       History of depression Patient has a history of depression but remains symptomatic. Has no motivation to participate in any activities and a persistent depressed mood Continue bupropion, trazodone and Cymbalta Consult psychiatry for medication adjustment       Hypothyroidism Continue Synthroid but decrease dose to 50 mcg due to increased free T4 levels       Obesity Complicates overall prognosis and care Life modification and exercise has been discussed with patient in detail.      Hypokalemia Supplement potassium Check magnesium levels     History  of UTI No pyuria and asymptomatic Discontinue Rocephin.           Subjective: Patient is seen and examined at the bedside.  Events of last night noted, patient was said to have had an episode of hallucination.  She is awake, alert and oriented to person  place and time during my examination.  Admits to being depressed and states that her antidepressants make her very "loopy" and sleepy  Physical Exam: Vitals:   11/22/22 0006 11/22/22 0449 11/22/22 0500 11/22/22 0817  BP: (!) 137/97 (!) 143/93  (!) 136/99  Pulse: (!) 107 (!) 101    Resp: 18 16  18   Temp: 98.4 F (36.9 C) 97.6 F (36.4 C)  98 F (36.7 C)  TempSrc: Oral Oral    SpO2: 100% 96%  97%  Weight:   101.2 kg   Height:        Vitals and nursing note reviewed.  Constitutional:      General: She is not in acute distress.    Appearance: She is obese.  HENT:     Head: Normocephalic and atraumatic.     Right Ear: Hearing normal.     Left Ear: Hearing normal.     Nose: Nose normal. No nasal deformity.     Mouth/Throat:     Lips: Pink.     Tongue: No lesions.     Pharynx: Oropharynx is clear.  Eyes:     General: Lids are normal.     Extraocular Movements: Extraocular movements intact.  Cardiovascular:     Rate and Rhythm: Tachycardic    Heart sounds: Normal heart sounds.  Pulmonary:     Effort: Pulmonary effort is normal.     Breath sounds: Normal breath sounds.  Abdominal:     General: Bowel sounds are normal. There is no distension.     Palpations: Abdomen is soft. There is no mass.     Tenderness: There is no abdominal tenderness.  Musculoskeletal:     Right lower leg: No edema.     Left lower leg: No edema.  Skin:    General: Skin is warm.  Neurological:     General: No focal deficit present.     Mental Status: She is alert and oriented to person, place, and time.     Cranial Nerves: Cranial nerves 2-12 are intact.     Motor: Weakness present.  Psychiatric:        Attention and Perception: Attention normal.        Mood and Affect: Mood normal.        Speech: Speech normal.        Behavior: Behavior normal. Behavior is cooperative.     Data Reviewed: Labs reviewed There are no new results to review at this time.  Family Communication: Discussed plan  of care with patient.  Discussed plan of care with daughter over the phone who states that patient is unable to care for herself at home and requires long-term placement.  Disposition: Status is: Inpatient Remains inpatient appropriate because: Awaiting discharge  Planned Discharge Destination: Skilled nursing facility    Time spent: 30  minutes  Author: Lucile Shutters, MD 11/22/2022 11:35 AM  For on call review [email or web address].

## 2022-11-22 NOTE — Progress Notes (Signed)
Pt refused cpap therapy this admission.

## 2022-11-22 NOTE — Plan of Care (Signed)

## 2022-11-22 NOTE — Consult Note (Signed)
PHARMACY CONSULT NOTE - ELECTROLYTES  Pharmacy Consult for Electrolyte Monitoring and Replacement   Recent Labs: Height: 5\' 5"  (165.1 cm) Weight: 101.2 kg (223 lb 1.7 oz) IBW/kg (Calculated) : 57 Estimated Creatinine Clearance: 63.1 mL/min (by C-G formula based on SCr of 0.88 mg/dL). Potassium (mmol/L)  Date Value  11/22/2022 3.6  05/03/2012 3.7   Magnesium (mg/dL)  Date Value  96/29/5284 2.2   Calcium (mg/dL)  Date Value  13/24/4010 8.4 (L)   Calcium, Total (mg/dL)  Date Value  27/25/3664 9.0   Albumin (g/dL)  Date Value  40/34/7425 3.2 (L)  01/30/2021 4.0  05/02/2012 3.4   Phosphorus (mg/dL)  Date Value  95/63/8756 2.7   Sodium  Date Value  11/22/2022 137 mmol/L  10/19/2022 141  05/03/2012 138 mmol/L   Corrected Ca: 9.1 mg/dL  Assessment  Robin Espinoza is a 77 y.o. female presenting with increasing weakness/confusion,  Afib w/ RVR. PMH significant for CAD/Afib. Pharmacy has been consulted to monitor and replace electrolytes.  Diet: Yes 8/30 >  MIVF:  N/A Pertinent medications: PTA: furosemide 40 mg daily  Goal of Therapy: K closer to 4, Mag 2 given cardiac hx  Plan:  PO KCL 20 mEq x 2 for total of 40 mEq Check BMP with AM labs  Thank you for allowing pharmacy to be a part of this patient's care.  Sharen Hones, PharmD, BCPS Clinical Pharmacist   11/22/2022 6:48 AM

## 2022-11-22 NOTE — Evaluation (Signed)
Physical Therapy Evaluation Patient Details Name: Robin Espinoza MRN: [id number] DOB: December 25, 1945 Today's Date: 11/22/2022  History of Present Illness  Patient is a 77 year old female who presented to ED s/p fall at home unresponsive. Patient had weakness and confusion over past four days prior to admission. PMH includes CAD, HTN, hypothyroidism, falls, GERD, obesity, sleep apnea, depression, osteoarthrosis and anxiety. Patient recently discharged from subacute rehab.   Clinical Impression  Patient is a pleasant 77 year old female who presents in bed upon PT arrival.   Prior to hospital admission, pt was recently returned home from SNF admission and lives with her family. At home she has a lift chair, level entry, and RW.  Patient is incontinent at baseline. She requires assistance for ADLs at this time. History primarily taken from chart as patient is pleasantly confused.  Patient is agreeable to participate in therapy and requires Max A to transition EOB with very little ability to move legs to EOB or push with arms to obtain position. She is unable to maintain seated position EOB and requires Max A to sit EOB at this time. Patient is returned to bed due to increasing weakness and dependence on PT to remain in seated position. Patient returned to supine with needs met. Patient is weak and a high risk for falls at this time. She is unsafe to return home at this time. Pt would benefit from skilled PT to address noted impairments and functional limitations (see below for any additional details).         If plan is discharge home, recommend the following: A lot of help with bathing/dressing/bathroom;A lot of help with walking and/or transfers;Assistance with cooking/housework;Direct supervision/assist for financial management;Direct supervision/assist for medications management;Assist for transportation;Help with stairs or ramp for entrance;Supervision due to cognitive status   Can travel by private  vehicle   No    Equipment Recommendations Other (comment) (none pending return to SNF, if different d/c will re-assess need)  Recommendations for Other Services       Functional Status Assessment Patient has had a recent decline in their functional status and demonstrates the ability to make significant improvements in function in a reasonable and predictable amount of time.     Precautions / Restrictions Precautions Precautions: Fall Precaution Comments: very weak, requires extensive support Restrictions Weight Bearing Restrictions: No      Mobility  Bed Mobility Overal bed mobility: Needs Assistance Bed Mobility: Supine to Sit, Sit to Supine     Supine to sit: Max assist, HOB elevated, Used rails Sit to supine: Max assist, HOB elevated, Used rails   General bed mobility comments: Patient unable to move legs without Max A, unable to move trunk without max A    Transfers                   General transfer comment: unable to attempt this session.    Ambulation/Gait               General Gait Details: unsafe to attempt this session.  Stairs            Wheelchair Mobility     Tilt Bed    Modified Rankin (Stroke Patients Only)       Balance Overall balance assessment: History of Falls, Needs assistance Sitting-balance support: Feet supported, Bilateral upper extremity supported Sitting balance-Leahy Scale: Poor Sitting balance - Comments: requires max A Postural control: Posterior lean     Standing balance comment: unable to assess  this session                             Pertinent Vitals/Pain Pain Assessment Pain Assessment: No/denies pain    Home Living Family/patient expects to be discharged to:: Skilled nursing facility Living Arrangements: Spouse/significant other                 Additional Comments: Patient came from home but is expected to go to SNF as she was recently d/c from SNF.    Prior Function  Prior Level of Function : History of Falls (last six months);Independent/Modified Independent             Mobility Comments: Patient was limited with ambulation but ambulating prior to recent admission and SNF stint. Reports she has not been walking as much. ADLs Comments: incontinent, wears pull ups, takes sponge baths     Extremity/Trunk Assessment   Upper Extremity Assessment Upper Extremity Assessment: Defer to OT evaluation    Lower Extremity Assessment Lower Extremity Assessment: Generalized weakness (unable to formally assess EOB due to requiring Max A to remain seated EOB this session.)       Communication   Communication Communication: No apparent difficulties Cueing Techniques: Verbal cues;Tactile cues;Visual cues;Gestural cues  Cognition Arousal: Alert Behavior During Therapy: Anxious Overall Cognitive Status: History of cognitive impairments - at baseline                                 General Comments: Patient is confused to location, time, and situation. She is aware of herself and that she is weak.        General Comments General comments (skin integrity, edema, etc.): Patient has BLE swelling noted, wound on R shin    Exercises Other Exercises Other Exercises: Patient educated on role of PT in acute care setting, safe bed mobility and transfers.   Assessment/Plan    PT Assessment Patient needs continued PT services  PT Problem List Decreased strength;Decreased activity tolerance;Decreased mobility;Decreased balance;Decreased cognition;Decreased safety awareness;Obesity       PT Treatment Interventions DME instruction;Gait training;Stair training;Functional mobility training;Therapeutic activities;Therapeutic exercise;Neuromuscular re-education;Balance training;Patient/family education    PT Goals (Current goals can be found in the Care Plan section)  Acute Rehab PT Goals Patient Stated Goal: to not be as weak. PT Goal Formulation:  With patient Time For Goal Achievement: 12/06/22 Potential to Achieve Goals: Fair    Frequency Min 3X/week     Co-evaluation               AM-PAC PT "6 Clicks" Mobility  Outcome Measure Help needed turning from your back to your side while in a flat bed without using bedrails?: A Lot Help needed moving from lying on your back to sitting on the side of a flat bed without using bedrails?: Total Help needed moving to and from a bed to a chair (including a wheelchair)?: A Lot Help needed standing up from a chair using your arms (e.g., wheelchair or bedside chair)?: A Lot Help needed to walk in hospital room?: Total Help needed climbing 3-5 steps with a railing? : Total 6 Click Score: 9    End of Session Equipment Utilized During Treatment: Gait belt Activity Tolerance: Patient limited by fatigue Patient left: in bed;with call bell/phone within reach;with bed alarm set Nurse Communication: Mobility status PT Visit Diagnosis: Unsteadiness on feet (R26.81);Other abnormalities of gait and  mobility (R26.89);Repeated falls (R29.6);Muscle weakness (generalized) (M62.81);History of falling (Z91.81);Difficulty in walking, not elsewhere classified (R26.2)    Time: 9604-5409 PT Time Calculation (min) (ACUTE ONLY): 16 min   Charges:   PT Evaluation $PT Eval Moderate Complexity: 1 Mod   PT General Charges $$ ACUTE PT VISIT: 1 Visit         Precious Bard, PT, DPT Physical Therapist - [facility]  Outpatient Physical Therapy- Main Campus [phone]    11/22/2022, 8:45 AM

## 2022-11-22 NOTE — TOC CM/SW Note (Signed)
Attempted to speak with patient for assessment of needs and to discuss SNF recommendations, patient not available.  Per chart, she was discharged to [facility] in July.    Kemper Durie, RN, MSN, CCM Transition of Care RNCM

## 2022-11-23 ENCOUNTER — Other Ambulatory Visit: Payer: Self-pay | Admitting: Nurse Practitioner

## 2022-11-23 DIAGNOSIS — I509 Heart failure, unspecified: Secondary | ICD-10-CM

## 2022-11-23 DIAGNOSIS — R29898 Other symptoms and signs involving the musculoskeletal system: Secondary | ICD-10-CM | POA: Diagnosis not present

## 2022-11-23 LAB — BASIC METABOLIC PANEL
Anion gap: 8 (ref 5–15)
BUN: 21 mg/dL (ref 8–23)
CO2: 27 mmol/L (ref 22–32)
Calcium: 8.3 mg/dL — ABNORMAL LOW (ref 8.9–10.3)
Chloride: 102 mmol/L (ref 98–111)
Creatinine, Ser: 0.96 mg/dL (ref 0.44–1.00)
GFR, Estimated: >60 mL/min (ref >60)
Glucose, Bld: 111 mg/dL — ABNORMAL HIGH (ref 70–99)
Potassium: 4.1 mmol/L (ref 3.5–5.1)
Sodium: 137 mmol/L (ref 135–145)

## 2022-11-23 MED ORDER — POLYETHYLENE GLYCOL 3350 17 G PO PACK
17.0000 g | PACK | Freq: Every day | ORAL | Status: DC
  Administered 2022-11-23 – 2022-11-30 (×7): 17 g via ORAL
  Filled 2022-11-23 (×7): qty 1

## 2022-11-23 MED ORDER — ARIPIPRAZOLE 2 MG PO TABS
2.0000 mg | ORAL_TABLET | Freq: Every day | ORAL | Status: DC
  Administered 2022-11-24 – 2022-11-30 (×7): 2 mg via ORAL
  Filled 2022-11-23 (×7): qty 1

## 2022-11-23 MED ORDER — DULOXETINE HCL 30 MG PO CPEP
90.0000 mg | ORAL_CAPSULE | Freq: Every day | ORAL | Status: DC
  Administered 2022-11-24 – 2022-11-25 (×2): 90 mg via ORAL
  Filled 2022-11-23 (×2): qty 3

## 2022-11-23 MED ORDER — METOPROLOL SUCCINATE ER 25 MG PO TB24
25.0000 mg | ORAL_TABLET | Freq: Once | ORAL | Status: AC
  Administered 2022-11-23: 25 mg via ORAL
  Filled 2022-11-23: qty 1

## 2022-11-23 MED ORDER — METOPROLOL SUCCINATE ER 50 MG PO TB24
75.0000 mg | ORAL_TABLET | Freq: Every day | ORAL | Status: DC
  Administered 2022-11-24 – 2022-11-30 (×7): 75 mg via ORAL
  Filled 2022-11-23 (×7): qty 1

## 2022-11-23 NOTE — Consult Note (Signed)
[facility] Face-to-Face Psychiatry Consult   Reason for Consult: Depression Referring Physician: Agbata Patient Identification: Robin Espinoza MRN:  [id number] Principal Diagnosis: Bilateral leg weakness Diagnosis:  Principal Problem:   Bilateral leg weakness Active Problems:   Anxiety disorder   Hypothyroidism   Obstructive apnea   Hypokalemia   Essential hypertension   Atrial flutter (HCC)   Hypotension   Falls   Total Time spent with patient: 20 minutes  Subjective:   Robin Espinoza is a 77 y.o. female patient admitted with status post fall with history of a flutter COPD, hypothyroidism hypotension, anxiety and depression.Marland Kitchen  HPI: Robin Espinoza is a 77 year old very pleasant white female who has been on Cymbalta and Wellbutrin she thinks for about 2 months.  She still feels depressed but denies being suicidal.  She currently does not have a psychiatrist.  She is also on trazodone at nighttime and states that it does work.  I talked to her about making some changes and she was agreeable.  Past Psychiatric History: Anxiety and depression but no psychiatric hospitalization  Risk to Self:   Risk to Others:   Prior Inpatient Therapy:   Prior Outpatient Therapy:    Past Medical History:  Past Medical History:  Diagnosis Date   Anxiety    Depression    Family history of adverse reaction to anesthesia    sister had a reaction when had a baby   GERD (gastroesophageal reflux disease)    Hyperlipidemia 12/21/2005   Hypertension    Hypothyroidism    Insomnia    OAB (overactive bladder)    Obstructive sleep apnea    Osteoarthrosis    Vitamin D deficiency     Past Surgical History:  Procedure Laterality Date   COLONOSCOPY     X 2   Cortisone injections in back     EYE SURGERY     TOTAL KNEE ARTHROPLASTY 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   Procedure: RIGHT TOTAL KNEE ARTHROPLASTY;  Surgeon: Juanell Fairly, MD;  Location: [facility] ORS;  Service: Orthopedics;  Laterality: Right;   TUBAL LIGATION  1981    Family History:  Family History  Problem Relation Age of Onset   Diabetes Father    Breast cancer Neg Hx    Family Psychiatric  History: Unremarkable Social History:  Social History   Substance and Sexual Activity  Alcohol Use Yes   Comment: rarely 1 drink     Social History   Substance and Sexual Activity  Drug Use No    Social History   Socioeconomic History   Marital status: Married    Spouse name: Not on file   Number of children: 3   Years of education: Not on file   Highest education level: Master's degree (e.g., MA, MS, MEng, MEd, MSW, MBA)  Occupational History   Occupation: retired  Tobacco Use   Smoking status: Former   Smokeless tobacco: Never   Tobacco comments:    in college  Vaping Use   Vaping status: Never Used  Substance and Sexual Activity   Alcohol use: Yes    Comment: rarely 1 drink   Drug use: No   Sexual activity: Not on file  Other Topics Concern   Not on file  Social History Narrative   Not on file   Social Determinants of Health   Financial Resource Strain: Low Risk  (08/31/2022)   Received from [facility]   Overall Financial Resource Strain (CARDIA)    Difficulty of Paying  Living Expenses: Not hard at all  Food Insecurity: No Food Insecurity (11/21/2022)   Hunger Vital Sign    Worried About Running Out of Food in the Last Year: Never true    Ran Out of Food in the Last Year: Never true  Transportation Needs: No Transportation Needs (11/21/2022)   PRAPARE - Administrator, Civil Service (Medical): No    Lack of Transportation (Non-Medical): No  Physical Activity: Inactive (04/04/2020)   Exercise Vital Sign    Days of Exercise per Week: 0 days    Minutes of Exercise per Session: 0 min  Stress: No Stress Concern Present (04/04/2020)   Robin Espinoza of Occupational Health - Occupational Stress Questionnaire    Feeling of Stress : Not at all  Social Connections:  Socially Integrated (04/04/2020)   Social Connection and Isolation Panel [NHANES]    Frequency of Communication with Friends and Family: More than three times a week    Frequency of Social Gatherings with Friends and Family: More than three times a week    Attends Religious Services: More than 4 times per year    Active Member of [employer] or Organizations: Yes    Attends Engineer, structural: More than 4 times per year    Marital Status: Married   Additional Social History:    Allergies:   Allergies  Allergen Reactions   Oxycodone Other (See Comments)    hallucinated   Amlodipine Swelling    Patient has been tolerating low dose    Labs:  Results for orders placed or performed during the hospital encounter of 11/20/22 (from the past 48 hour(s))  Lactic acid, plasma     Status: Abnormal   Collection Time: 11/21/22  3:36 PM  Result Value Ref Range   Lactic Acid, Venous 2.5 (HH) 0.5 - 1.9 mmol/L    Comment: CRITICAL RESULT CALLED TO, READ BACK BY AND VERIFIED WITH ROBYN THOMAS 11/21/2022 AT 1631 SRR Performed at [facility], [address] [place], [place] [address]   Lactic acid, plasma     Status: None   Collection Time: 11/21/22  7:29 PM  Result Value Ref Range   Lactic Acid, Venous 1.9 0.5 - 1.9 mmol/L    Comment: Performed at [facility], [address], [place], [place] [address]  Basic metabolic panel     Status: Abnormal   Collection Time: 11/22/22  5:10 AM  Result Value Ref Range   Sodium 137 135 - 145 mmol/L   Potassium 3.6 3.5 - 5.1 mmol/L   Chloride 102 98 - 111 mmol/L   CO2 28 22 - 32 mmol/L   Glucose, Bld 94 70 - 99 mg/dL    Comment: Glucose reference range applies only to samples taken after fasting for at least 8 hours.   BUN 18 8 - 23 mg/dL   Creatinine, Ser 4.19 0.44 - 1.00 mg/dL   Calcium 8.4 (L) 8.9 - 10.3 mg/dL   GFR, Estimated >62 >22 mL/min    Comment: (NOTE) Calculated using the CKD-EPI Creatinine Equation (2021)     Anion gap 7 5 - 15    Comment: Performed at [facility], [address] [place], [place] [address]  Magnesium     Status: None   Collection Time: 11/22/22  5:10 AM  Result Value Ref Range   Magnesium 2.2 1.7 - 2.4 mg/dL    Comment: Performed at [facility], [address] [place], [place] [address]  Basic metabolic  panel     Status: Abnormal   Collection Time: 11/23/22  4:01 AM  Result Value Ref Range   Sodium 137 135 - 145 mmol/L   Potassium 4.1 3.5 - 5.1 mmol/L   Chloride 102 98 - 111 mmol/L   CO2 27 22 - 32 mmol/L   Glucose, Bld 111 (H) 70 - 99 mg/dL    Comment: Glucose reference range applies only to samples taken after fasting for at least 8 hours.   BUN 21 8 - 23 mg/dL   Creatinine, Ser 4.16 0.44 - 1.00 mg/dL   Calcium 8.3 (L) 8.9 - 10.3 mg/dL   GFR, Estimated >60 >63 mL/min    Comment: (NOTE) Calculated using the CKD-EPI Creatinine Equation (2021)    Anion gap 8 5 - 15    Comment: Performed at [facility], [address] [place], [place] [address]    Current Facility-Administered Medications  Medication Dose Route Frequency Provider Last Rate Last Admin   acetaminophen (TYLENOL) tablet 650 mg  650 mg Oral Q6H PRN Gertha Calkin, MD       Or   acetaminophen (TYLENOL) suppository 650 mg  650 mg Rectal Q6H PRN Gertha Calkin, MD       amiodarone (PACERONE) tablet 100 mg  100 mg Oral Daily Irena Cords V, MD   100 mg at 11/23/22 0844   apixaban (ELIQUIS) tablet 5 mg  5 mg Oral BID Irena Cords V, MD   5 mg at 11/23/22 0844   [START ON 11/24/2022] ARIPiprazole (ABILIFY) tablet 2 mg  2 mg Oral QPC breakfast Sarina Ill, DO       [START ON 11/24/2022] DULoxetine (CYMBALTA) DR capsule 90 mg  90 mg Oral Daily Sarina Ill, DO       gabapentin (NEURONTIN) capsule 200 mg  200 mg Oral BID Irena Cords V, MD   200 mg at 11/23/22 0844   HYDROcodone-acetaminophen (NORCO/VICODIN) 5-325 MG per tablet 1 tablet  1 tablet Oral Q4H PRN Gertha Calkin, MD       levothyroxine (SYNTHROID) tablet 50 mcg  50 mcg Oral Q0600 Agbata, Tochukwu, MD   50 mcg at 11/23/22 0612   [START ON 11/24/2022] metoprolol succinate (TOPROL-XL) 24 hr tablet 75 mg  75 mg Oral Daily Agbata, Tochukwu, MD       morphine (PF) 2 MG/ML injection 2 mg  2 mg Intravenous Q8H PRN Gertha Calkin, MD       pantoprazole (PROTONIX) EC tablet 40 mg  40 mg Oral BID Sharen Hones, RPH   40 mg at 11/23/22 0844   polyethylene glycol (MIRALAX / GLYCOLAX) packet 17 g  17 g Oral Daily Agbata, Tochukwu, MD   17 g at 11/23/22 1304   sodium chloride flush (NS) 0.9 % injection 3 mL  3 mL Intravenous Q12H Irena Cords V, MD   3 mL at 11/23/22 0844   traZODone (DESYREL) tablet 100 mg  100 mg Oral QHS Gertha Calkin, MD   100 mg at 11/22/22 2046    Musculoskeletal: Strength & Muscle Tone: within normal limits Gait & Station: normal Patient leans: N/A            Psychiatric Specialty Exam:  Presentation  General Appearance: No data recorded Eye Contact:No data recorded Speech:No data recorded Speech Volume:No data recorded Handedness:No data recorded  Mood and Affect  Mood:No data recorded Affect:No data recorded  Thought Process  Thought Processes:No data recorded Descriptions of Associations:No data  recorded Orientation:No data recorded Thought Content:No data recorded History of Schizophrenia/Schizoaffective disorder:No data recorded Duration of Psychotic Symptoms:No data recorded Hallucinations:No data recorded Ideas of Reference:No data recorded Suicidal Thoughts:No data recorded Homicidal Thoughts:No data recorded  Sensorium  Memory:No data recorded Judgment:No data recorded Insight:No data recorded  Executive Functions  Concentration:No data recorded Attention Span:No data recorded Recall:No data recorded Fund of Knowledge:No data recorded Language:No data recorded  Psychomotor Activity  Psychomotor Activity:No data recorded  Assets   Assets:No data recorded  Sleep  Sleep:No data recorded She is alert and oriented x 3.  Good eye contact.  Speech is conversational and not pressured.  Very pleasant and cooperative.  Mood is depressed affect is flat thought process is goal directed thought content: She denies suicidal ideation, homicidal ideation, auditory or visual hallucinations.  Her judgment and insight are good.  Blood pressure (!) 154/116, pulse (!) 110, temperature 98.4 F (36.9 C), resp. rate 20, height 5\' 5"  (1.651 m), weight 103.9 kg, SpO2 97%. Body mass index is 38.12 kg/m.  Treatment Plan Summary: Medication management and Plan discontinue Wellbutrin and increase Cymbalta to 90 mg/day.  Continue trazodone and start Abilify 2 mg in the morning.  Disposition: No evidence of imminent risk to self or others at present.   Patient does not meet criteria for psychiatric inpatient admission.  Dorie Ohms [place], DO 11/23/2022 1:36 PM

## 2022-11-23 NOTE — Progress Notes (Signed)
Progress Note   Patient: Robin Espinoza GBT:[id number] DOB: [birth year] DOA: 11/20/2022     3 DOS: the patient was seen and examined on 11/23/2022   Brief hospital course: Robin Espinoza is a 77 y.o. female with medical history significant for CAD, hypertension, hypothyroidism, falls, GERD, obesity, obstructive sleep apnea, depression, anxiety presenting with a fall at home unresponsive.  Vital does report hitting her head, but no loss of consciousness.  Patient states that she has been dizzy off and on, including a while.  On initial presentation husband at bedside reported that patient has been weak and intermittently confused over the past 4 days that has been worse over the past 4 days.  Patient was seen by PCP and started on antibiotics for UTI.  Urinalysis today is normal.  Emergency room patient's initial vitals showed that she was hypotensive with systolic of 89 patient states that her husband is having difficulty taking care of her.  Overall patient is not in any pain and does not have any chest pain shortness of breath palpitations headache speech or any vision issues no nausea vomiting or diarrhea. EKG shows atrial flutter 97, right axis deviation, poor voltage.  But appears patient needed assistance and had a difficult time pulling herself up in bed. Lab work shows potassium of 2.9 normal creatinine normal liver is normal magnesium CPK 23, CBC is within normal limits except for MCV of 101.1. Urinalysis is hazy and yellow. CT imaging of spine head within normal limits. Pt last echo in June 2024 shows: 1. Left ventricular ejection fraction, by estimation, is 55 to 60% . The left ventricle has normal function. Left ventricular endocardial border not optimally defined to evaluate regional wall motion. There is moderate left ventricular hypertrophy. Left ventricular diastolic parameters are indeterminate. 2. Right ventricular systolic function is low normal. The right ventricular size is normal. Mildly  increased right ventricular wall thickness. Tricuspid regurgitation signal is inadequate for assessing PA pressure. 3. The mitral valve is normal in structure. Trivial mitral valve regurgitation. 4. The aortic valve has an indeterminant number of cusps. There is mild calcification of the aortic valve. There is mild thickening of the aortic valve. Aortic valve regurgitation is not visualized. No aortic stenosis is present.     Assessment and Plan:  Falls Generalized weakness Presents to the ER for evaluation of weakness and frequent falls At baseline patient ambulates with a rolling walker and states that she had her walker when she fell.  She struck her head but denies any loss of consciousness and was able to call EMS.  She has a life alert. Continue to monitor fall precautions Appreciate PT input.  They recommend return to skilled nursing facility at discharge. Patient was recently discharged from subacute rehab TOC consult       History of A-fib Continue amiodarone and metoprolol for rate control Increase dose of metoprolol to 75 mg daily Continue Eliquis as primary prophylaxis for an acute stroke       History of depression ?? Dementia Patient has a history of depression but remains symptomatic. Has no motivation to participate in any activities and a persistent depressed mood Continues to have episodes of confusion especially at night Continue bupropion, trazodone and Cymbalta Consult psychiatry for medication adjustment/recommendation       Hypothyroidism Continue Synthroid but decrease dose to 50 mcg due to increased free T4 levels       Obesity (BMI 38.12 kg/m2) Complicates overall prognosis and care Life modification and exercise  has been discussed with patient in detail.       Hypokalemia Supplement potassium Check magnesium levels     History of UTI No pyuria and asymptomatic Discontinue Rocephin.            Subjective: Patient is seen and  examined at the bedside.  No new complaints  Physical Exam: Vitals:   11/23/22 0156 11/23/22 0516 11/23/22 0801 11/23/22 0900  BP:  (!) 138/116 (!) 154/116   Pulse:  82  (!) 110  Resp:  16 20   Temp:  97.7 F (36.5 C) 98.4 F (36.9 C)   TempSrc:      SpO2:  100% 97%   Weight: 103.9 kg     Height:       Constitutional:      General: She is not in acute distress.    Appearance: She is obese.  HENT:     Head: Normocephalic and atraumatic.     Right Ear: Hearing normal.     Left Ear: Hearing normal.     Nose: Nose normal. No nasal deformity.     Mouth/Throat:     Lips: Pink.     Tongue: No lesions.     Pharynx: Oropharynx is clear.  Eyes:     General: Lids are normal.     Extraocular Movements: Extraocular movements intact.  Cardiovascular:     Rate and Rhythm: Tachycardic    Heart sounds: Normal heart sounds.  Pulmonary:     Effort: Pulmonary effort is normal.     Breath sounds: Normal breath sounds.  Abdominal:     General: Bowel sounds are normal. There is no distension.     Palpations: Abdomen is soft. There is no mass.     Tenderness: There is no abdominal tenderness.  Musculoskeletal:     Right lower leg: No edema.     Left lower leg: No edema.  Skin:    General: Skin is warm.  Neurological:     General: No focal deficit present.     Mental Status: She is alert and oriented to person, place, and time.     Cranial Nerves: Cranial nerves 2-12 are intact.     Motor: Weakness present.  Psychiatric:        Attention and Perception: Attention normal.        Mood and Affect: Mood normal.        Speech: Speech normal.        Behavior: Behavior normal. Behavior is cooperative.        Data Reviewed: Labs reviewed There are no new results to review at this time.  Family Communication: Plan of care discussed with patient.  CODE STATUS discussed with the patient and she states that she is a DO NOT RESUSCITATE  Disposition: Status is: Inpatient Remains  inpatient appropriate because: Awaiting discharge to skilled nursing facility  Planned Discharge Destination: Skilled nursing facility    Time spent: 32 minutes  Author: Lucile Shutters, MD 11/23/2022 12:20 PM  For on call review [email or web address].

## 2022-11-23 NOTE — TOC CM/SW Note (Signed)
CSW spoke with patient she states she prefers [facility] again, I advised her we will start the workup.

## 2022-11-23 NOTE — Care Management Important Message (Signed)
Important Message  Patient Details  Name: TWYLAH NUDING MRN: [id number] Date of Birth: 1945/12/24   Medicare Important Message Given:  Yes     [name] 11/23/2022, 10:16 AM

## 2022-11-23 NOTE — Consult Note (Signed)
PHARMACY CONSULT NOTE - ELECTROLYTES  Pharmacy Consult for Electrolyte Monitoring and Replacement   Recent Labs: Height: 5\' 5"  (165.1 cm) Weight: 103.9 kg (229 lb 0.9 oz) IBW/kg (Calculated) : 57 Estimated Creatinine Clearance: 58.7 mL/min (by C-G formula based on SCr of 0.96 mg/dL). Potassium (mmol/L)  Date Value  11/23/2022 4.1  05/03/2012 3.7   Magnesium (mg/dL)  Date Value  20/25/4270 2.2   Calcium (mg/dL)  Date Value  62/37/6283 8.3 (L)   Calcium, Total (mg/dL)  Date Value  15/17/6160 9.0   Albumin (g/dL)  Date Value  73/71/0626 3.2 (L)  01/30/2021 4.0  05/02/2012 3.4   Phosphorus (mg/dL)  Date Value  94/85/4627 2.7   Sodium  Date Value  11/23/2022 137 mmol/L  10/19/2022 141  05/03/2012 138 mmol/L   Corrected Ca: 9.1 mg/dL  Assessment  Robin Espinoza is a 77 y.o. female presenting with increasing weakness/confusion,  Afib w/ RVR. PMH significant for CAD/Afib. Pharmacy has been consulted to monitor and replace electrolytes.  Goal of Therapy: K closer to 4, Mag 2 given cardiac hx  Plan:  No electrolyte replacement warranted for today Check BMP with AM labs  Thank you for allowing pharmacy to be a part of this patient's care.  Lowella Bandy, PharmD, BCPS Clinical Pharmacist   11/23/2022 9:34 AM

## 2022-11-24 ENCOUNTER — Inpatient Hospital Stay: Payer: PPO

## 2022-11-24 DIAGNOSIS — F339 Major depressive disorder, recurrent, unspecified: Secondary | ICD-10-CM | POA: Diagnosis not present

## 2022-11-24 DIAGNOSIS — F419 Anxiety disorder, unspecified: Secondary | ICD-10-CM | POA: Diagnosis not present

## 2022-11-24 DIAGNOSIS — R29898 Other symptoms and signs involving the musculoskeletal system: Secondary | ICD-10-CM | POA: Diagnosis not present

## 2022-11-24 DIAGNOSIS — I4891 Unspecified atrial fibrillation: Secondary | ICD-10-CM | POA: Diagnosis not present

## 2022-11-24 DIAGNOSIS — I110 Hypertensive heart disease with heart failure: Secondary | ICD-10-CM | POA: Diagnosis not present

## 2022-11-24 DIAGNOSIS — I7143 Infrarenal abdominal aortic aneurysm, without rupture: Secondary | ICD-10-CM | POA: Diagnosis not present

## 2022-11-24 DIAGNOSIS — I4892 Unspecified atrial flutter: Secondary | ICD-10-CM | POA: Diagnosis not present

## 2022-11-24 DIAGNOSIS — F331 Major depressive disorder, recurrent, moderate: Secondary | ICD-10-CM | POA: Diagnosis present

## 2022-11-24 DIAGNOSIS — H6123 Impacted cerumen, bilateral: Secondary | ICD-10-CM | POA: Diagnosis not present

## 2022-11-24 LAB — MAGNESIUM: Magnesium: 1.8 mg/dL (ref 1.7–2.4)

## 2022-11-24 LAB — BASIC METABOLIC PANEL
Anion gap: 6 (ref 5–15)
BUN: 19 mg/dL (ref 8–23)
CO2: 26 mmol/L (ref 22–32)
Calcium: 8.3 mg/dL — ABNORMAL LOW (ref 8.9–10.3)
Chloride: 103 mmol/L (ref 98–111)
Creatinine, Ser: 0.79 mg/dL (ref 0.44–1.00)
GFR, Estimated: >60 mL/min (ref >60)
Glucose, Bld: 102 mg/dL — ABNORMAL HIGH (ref 70–99)
Potassium: 4.5 mmol/L (ref 3.5–5.1)
Sodium: 135 mmol/L (ref 135–145)

## 2022-11-24 LAB — URINALYSIS, COMPLETE (UACMP) WITH MICROSCOPIC
Bacteria, UA: NONE SEEN
Bilirubin Urine: NEGATIVE
Glucose, UA: NEGATIVE mg/dL
Ketones, ur: NEGATIVE mg/dL
Leukocytes,Ua: NEGATIVE
Nitrite: NEGATIVE
Protein, ur: NEGATIVE mg/dL
Specific Gravity, Urine: 1.017 (ref 1.005–1.030)
pH: 5 (ref 5.0–8.0)

## 2022-11-24 LAB — BLOOD GAS, VENOUS
Acid-Base Excess: 8.4 mmol/L — ABNORMAL HIGH (ref 0.0–2.0)
Bicarbonate: 34.9 mmol/L — ABNORMAL HIGH (ref 20.0–28.0)
O2 Saturation: 28.9 %
Patient temperature: 37
pCO2, Ven: 55 mmHg (ref 44–60)
pH, Ven: 7.41 (ref 7.25–7.43)

## 2022-11-24 MED ORDER — MAGNESIUM OXIDE -MG SUPPLEMENT 400 (240 MG) MG PO TABS: 800.0000 mg | ORAL_TABLET | Freq: Once | ORAL | Status: DC

## 2022-11-24 MED ORDER — MAGNESIUM SULFATE 2 GM/50ML IV SOLN
2.0000 g | Freq: Once | INTRAVENOUS | Status: AC
  Administered 2022-11-24: 2 g via INTRAVENOUS
  Filled 2022-11-24: qty 50

## 2022-11-24 NOTE — Progress Notes (Signed)
Progress Note   Patient: Robin Espinoza ZOX:[id number] DOB: Jul 30, 1945 DOA: 11/20/2022     4 DOS: the patient was seen and examined on 11/24/2022   Brief hospital course: Robin Espinoza is a 77 y.o. female with medical history significant for CAD, hypertension, hypothyroidism, falls, GERD, obesity, obstructive sleep apnea, depression, anxiety presenting with a fall at home unresponsive.  Vital does report hitting her head, but no loss of consciousness.  Patient states that she has been dizzy off and on, including a while.  On initial presentation husband at bedside reported that patient has been weak and intermittently confused over the past 4 days that has been worse over the past 4 days.  Patient was seen by PCP and started on antibiotics for UTI.  Urinalysis today is normal.  Emergency room patient's initial vitals showed that she was hypotensive with systolic of 89 patient states that her husband is having difficulty taking care of her.  Overall patient is not in any pain and does not have any chest pain shortness of breath palpitations headache speech or any vision issues no nausea vomiting or diarrhea. EKG shows atrial flutter 97, right axis deviation, poor voltage.  But appears patient needed assistance and had a difficult time pulling herself up in bed. Lab work shows potassium of 2.9 normal creatinine normal liver is normal magnesium CPK 23, CBC is within normal limits except for MCV of 101.1. Urinalysis is hazy and yellow. CT imaging of spine head within normal limits. Pt last echo in June 2024 shows: 1. Left ventricular ejection fraction, by estimation, is 55 to 60% . The left ventricle has normal function. Left ventricular endocardial border not optimally defined to evaluate regional wall motion. There is moderate left ventricular hypertrophy. Left ventricular diastolic parameters are indeterminate. 2. Right ventricular systolic function is low normal. The right ventricular size is normal. Mildly  increased right ventricular wall thickness. Tricuspid regurgitation signal is inadequate for assessing PA pressure. 3. The mitral valve is normal in structure. Trivial mitral valve regurgitation. 4. The aortic valve has an indeterminant number of cusps. There is mild calcification of the aortic valve. There is mild thickening of the aortic valve. Aortic valve regurgitation is not visualized. No aortic stenosis is present.   Assessment and Plan:  Falls Generalized weakness Presents to the ER for evaluation of weakness and frequent falls At baseline patient ambulates with a rolling walker and states that she had her walker when she fell.  She struck her head but denies any loss of consciousness and was able to call EMS.  She has a life alert. Continue to monitor fall precautions Appreciate PT input.  They recommend return to skilled nursing facility at discharge. Patient was recently discharged from subacute rehab TOC consult       History of A-fib Continue amiodarone and metoprolol for rate control Increase dose of metoprolol to 75 mg daily Continue Eliquis as primary prophylaxis for an acute stroke       History of depression ?? Dementia Patient has a history of depression but remains symptomatic. Has no motivation to participate in any activities and a persistent depressed mood.  Denies having any suicidal ideation Continues to have episodes of confusion especially at night Appreciate psychiatry input, Wellbutrin was discontinued, Abilify 2 mg daily started on Cymbalta increased to 90 mg daily       Hypothyroidism Continue Synthroid but decrease dose to 50 mcg due to increased free T4 levels       Obesity (  BMI 38.12 kg/m2) Complicates overall prognosis and care Life modification and exercise has been discussed with patient in detail.       Hypokalemia Supplement potassium Check magnesium levels     History of UTI No pyuria and asymptomatic Discontinue Rocephin.               Subjective: No new complaints  Physical Exam: Vitals:   11/23/22 1936 11/24/22 0417 11/24/22 0439 11/24/22 0801  BP: (!) 114/94 (!) 129/96  111/84  Pulse: (!) 108   (!) 110  Resp: 18 18  19   Temp: 98.2 F (36.8 C) 98.3 F (36.8 C)  (!) 97.4 F (36.3 C)  TempSrc:    Oral  SpO2: 100% 96%  99%  Weight:   105 kg   Height:        Constitutional:      General: She is not in acute distress.    Appearance: She is obese.  HENT:     Head: Normocephalic and atraumatic.     Right Ear: Hearing normal.     Left Ear: Hearing normal.     Nose: Nose normal. No nasal deformity.     Mouth/Throat:     Lips: Pink.     Tongue: No lesions.     Pharynx: Oropharynx is clear.  Eyes:     General: Lids are normal.     Extraocular Movements: Extraocular movements intact.  Cardiovascular:     Rate and Rhythm: Tachycardic    Heart sounds: Normal heart sounds.  Pulmonary:     Effort: Pulmonary effort is normal.     Breath sounds: Normal breath sounds.  Abdominal:     General: Bowel sounds are normal. There is no distension.     Palpations: Abdomen is soft. There is no mass.     Tenderness: There is no abdominal tenderness.  Musculoskeletal:     Right lower leg: No edema.     Left lower leg: No edema.  Skin:    General: Skin is warm.  Neurological:     General: No focal deficit present.     Mental Status: She is alert and oriented to person, place, and time.     Cranial Nerves: Cranial nerves 2-12 are intact.     Motor: Weakness present.  Psychiatric:        Attention and Perception: Attention normal.        Mood and Affect: Mood normal.        Speech: Speech normal.        Behavior: Behavior normal. Behavior is cooperative.    Data Reviewed: Labs reviewed There are no new results to review at this time.  Family Communication: Plan of care discussed with patient's husband over the phone.  All questions and concerns have been addressed.  He verbalizes understanding and  agrees with the plan.  Disposition: Status is: Inpatient Remains inpatient appropriate because: Awaiting discharge to skilled nursing facility  Planned Discharge Destination: Skilled nursing facility    Time spent: 30 minutes  Author: Lucile Shutters, MD 11/24/2022 11:23 AM  For on call review [email or web address].

## 2022-11-24 NOTE — Consult Note (Signed)
PHARMACY CONSULT NOTE - ELECTROLYTES  Pharmacy Consult for Electrolyte Monitoring and Replacement   Recent Labs: Height: 5\' 5"  (165.1 cm) Weight: 105 kg (231 lb 7.7 oz) IBW/kg (Calculated) : 57 Estimated Creatinine Clearance: 70.8 mL/min (by C-G formula based on SCr of 0.79 mg/dL). Potassium (mmol/L)  Date Value  11/24/2022 4.5  05/03/2012 3.7   Magnesium (mg/dL)  Date Value  57/32/2025 1.8   Calcium (mg/dL)  Date Value  42/70/6237 8.3 (L)   Calcium, Total (mg/dL)  Date Value  62/83/1517 9.0   Albumin (g/dL)  Date Value  61/60/7371 3.2 (L)  01/30/2021 4.0  05/02/2012 3.4   Phosphorus (mg/dL)  Date Value  09/16/9483 2.7   Sodium  Date Value  11/24/2022 135 mmol/L  10/19/2022 141  05/03/2012 138 mmol/L   Corrected Ca: 8.5 mg/dL  Assessment  Robin Espinoza is a 77 y.o. female presenting with increasing weakness/confusion,  Afib w/ RVR. PMH significant for CAD/Afib. Pharmacy has been consulted to monitor and replace electrolytes.  Goal of Therapy: K closer to 4, Mag 2 given cardiac hx  Plan:  Give magnesium sulfate 2g IV x1 Check BMP, Mg, Phos with AM labs  Thank you for involving pharmacy in this patient's care.   Rockwell Alexandria, PharmD Clinical Pharmacist 11/24/2022 7:24 AM

## 2022-11-24 NOTE — Plan of Care (Signed)
  Problem: Education: Goal: Knowledge of General Education information will improve Description: Including pain rating scale, medication(s)/side effects and non-pharmacologic comfort measures Outcome: Not Progressing   Problem: Health Behavior/Discharge Planning: Goal: Ability to manage health-related needs will improve Outcome: Not Progressing   Problem: Clinical Measurements: Goal: Ability to maintain clinical measurements within normal limits will improve Outcome: Not Progressing Goal: Will remain free from infection Outcome: Not Progressing Goal: Diagnostic test results will improve Outcome: Not Progressing Goal: Respiratory complications will improve Outcome: Not Progressing Goal: Cardiovascular complication will be avoided Outcome: Not Progressing   Problem: Nutrition: Goal: Adequate nutrition will be maintained Outcome: Not Progressing   Problem: Activity: Goal: Risk for activity intolerance will decrease Outcome: Not Progressing   Problem: Coping: Goal: Level of anxiety will decrease Outcome: Not Progressing   Problem: Pain Managment: Goal: General experience of comfort will improve Outcome: Not Progressing   Problem: Safety: Goal: Ability to remain free from injury will improve Outcome: Not Progressing

## 2022-11-24 NOTE — Progress Notes (Signed)
   11/24/22 1612  Assess: MEWS Score  Temp 97.8 F (36.6 C)  BP 109/85  MAP (mmHg) 95  Pulse Rate (!) 115  Resp 16  SpO2 96 %  O2 Device Room Air  Assess: MEWS Score  MEWS Temp 0  MEWS Systolic 0  MEWS Pulse 2  MEWS RR 0  MEWS LOC 0  MEWS Score 2  MEWS Score Color Yellow  Assess: if the MEWS score is Yellow or Red  Were vital signs accurate and taken at a resting state? Yes  Does the patient meet 2 or more of the SIRS criteria? No  MEWS guidelines implemented  Yes, yellow  Treat  MEWS Interventions Considered administering scheduled or prn medications/treatments as ordered  Take Vital Signs  Increase Vital Sign Frequency  Yellow: Q2hr x1, continue Q4hrs until patient remains green for 12hrs  Escalate  MEWS: Escalate Yellow: Discuss with charge nurse and consider notifying provider and/or RRT  Notify: Charge Nurse/RN  Name of Charge Nurse/RN Notified Curtis Sites, RN  Provider Notification  Provider Name/Title Dr. Joylene Igo  Date Provider Notified 11/24/22  Time Provider Notified 1615  Method of Notification  (secure chat)  Notification Reason Other (Comment);Change in status (yellow mews)  Provider response See new orders  Date of Provider Response 11/24/22  Time of Provider Response 1617  Assess: SIRS CRITERIA  SIRS Temperature  0  SIRS Pulse 1  SIRS Respirations  0  SIRS WBC 0  SIRS Score Sum  1

## 2022-11-24 NOTE — NC FL2 (Signed)
[place] MEDICAID FL2 LEVEL OF CARE FORM     IDENTIFICATION  Patient Name: Robin Espinoza Birthdate: 1945-06-26 Sex: female Admission Date (Current Location): 11/20/2022  [place] and [place] Number:  Chiropodist and Address:  [facility], [address] [place], [place] [address]      Provider Number: [id number]  Attending Physician Name and Address:  Lucile Shutters, MD  Relative Name and Phone Number:       Current Level of Care: Hospital Recommended Level of Care: Skilled Nursing Facility Prior Approval Number:    Date Approved/Denied:   PASRR Number: [id number]  Discharge Plan: SNF    Current Diagnoses: Patient Active Problem List   Diagnosis Date Noted   Hypotension 11/21/2022   Falls 11/21/2022   Bilateral leg weakness 11/20/2022   Chronic atrial fibrillation (HCC) 11/13/2022   Muscle weakness (generalized) 09/23/2022   Bilateral leg edema 09/19/2022   Atrial flutter (HCC) 09/19/2022   Atrial flutter with rapid ventricular response (HCC) 09/18/2022   Acute heart failure with preserved ejection fraction (HFpEF) (HCC) 09/18/2022   Atrial fibrillation with rapid ventricular response (HCC) 09/16/2022   Elevated brain natriuretic peptide (BNP) level 09/16/2022   Hypokalemia 09/16/2022   Essential hypertension 09/16/2022   Dyslipidemia 09/16/2022   GERD without esophagitis 09/16/2022   Depression, recurrent (HCC) 01/30/2021   Chronic idiopathic constipation 01/30/2021   Need for shingles vaccine 01/30/2021   Hypothyroidism 11/13/2014   Anxiety disorder 10/15/2014   Insomnia 09/21/2014   Acid reflux 11/30/2007   Arthritis, degenerative 09/16/2007   Obstructive apnea 05/06/2006    Orientation RESPIRATION BLADDER Height & Weight     Self, Time, Situation, Place  Normal Incontinent Weight: 231 lb 7.7 oz (105 kg) Height:  5\' 5"  (165.1 cm)  BEHAVIORAL SYMPTOMS/MOOD NEUROLOGICAL BOWEL NUTRITION STATUS      Continent     AMBULATORY STATUS COMMUNICATION OF NEEDS Skin   Extensive Assist Verbally Normal                       Personal Care Assistance Level of Assistance  Bathing, Feeding, Dressing Bathing Assistance: Maximum assistance Feeding assistance: Limited assistance Dressing Assistance: Maximum assistance     Functional Limitations Info  Sight, Hearing, Speech Sight Info: Impaired Hearing Info: Adequate Speech Info: Adequate    SPECIAL CARE FACTORS FREQUENCY  PT (By licensed PT), OT (By licensed OT)     PT Frequency: 5 times a week OT Frequency: 5 times a week            Contractures Contractures Info: Not present    Additional Factors Info  Code Status, Allergies Code Status Info: DNR-interventions desired Allergies Info: Oxycodone  Amlodipine           Current Medications (11/24/2022):  This is the current hospital active medication list Current Facility-Administered Medications  Medication Dose Route Frequency Provider Last Rate Last Admin   acetaminophen (TYLENOL) tablet 650 mg  650 mg Oral Q6H PRN Gertha Calkin, MD       Or   acetaminophen (TYLENOL) suppository 650 mg  650 mg Rectal Q6H PRN Gertha Calkin, MD       amiodarone (PACERONE) tablet 100 mg  100 mg Oral Daily Irena Cords V, MD   100 mg at 11/24/22 0907   apixaban (ELIQUIS) tablet 5 mg  5 mg Oral BID Gertha Calkin, MD   5 mg at 11/24/22 0908   ARIPiprazole (ABILIFY) tablet 2 mg  2  mg Oral QPC breakfast Sarina Ill, DO   2 mg at 11/24/22 1610   DULoxetine (CYMBALTA) DR capsule 90 mg  90 mg Oral Daily Sarina Ill, DO   90 mg at 11/24/22 9604   gabapentin (NEURONTIN) capsule 200 mg  200 mg Oral BID Gertha Calkin, MD   200 mg at 11/24/22 5409   HYDROcodone-acetaminophen (NORCO/VICODIN) 5-325 MG per tablet 1 tablet  1 tablet Oral Q4H PRN Gertha Calkin, MD       levothyroxine (SYNTHROID) tablet 50 mcg  50 mcg Oral Q0600 Agbata, Tochukwu, MD   50 mcg at 11/24/22 0544   metoprolol succinate  (TOPROL-XL) 24 hr tablet 75 mg  75 mg Oral Daily Agbata, Tochukwu, MD   75 mg at 11/24/22 0908   morphine (PF) 2 MG/ML injection 2 mg  2 mg Intravenous Q8H PRN Gertha Calkin, MD       pantoprazole (PROTONIX) EC tablet 40 mg  40 mg Oral BID Sharen Hones, RPH   40 mg at 11/24/22 0907   polyethylene glycol (MIRALAX / GLYCOLAX) packet 17 g  17 g Oral Daily Agbata, Tochukwu, MD   17 g at 11/24/22 0907   sodium chloride flush (NS) 0.9 % injection 3 mL  3 mL Intravenous Q12H Irena Cords V, MD   3 mL at 11/24/22 0908   traZODone (DESYREL) tablet 100 mg  100 mg Oral QHS Gertha Calkin, MD   100 mg at 11/23/22 2117     Discharge Medications: Please see discharge summary for a list of discharge medications.  Relevant Imaging Results:  Relevant Lab Results:   Additional Information SS [id number]  Allena Katz, LCSW

## 2022-11-24 NOTE — TOC Progression Note (Signed)
Transition of Care [facility]) - Progression Note    Patient Details  Name: Robin Espinoza MRN: [id number] Date of Birth: Jun 25, 1945  [facility] CM/SW Contact  Allena Katz, LCSW Phone Number: 11/24/2022, 11:23 AM  Clinical Narrative:   Sue Lush from [facility] reports pt will be in her copay days as she just left [facility] on 8/8. CSW will follow up with patient and see what she would like to do.          Expected Discharge Plan and Services                                               Social Determinants of Health (SDOH) Interventions SDOH Screenings   Food Insecurity: No Food Insecurity (11/21/2022)  Housing: Low Risk  (11/21/2022)  Transportation Needs: No Transportation Needs (11/21/2022)  Utilities: Not At Risk (11/21/2022)  Alcohol Screen: Low Risk  (09/12/2021)  Depression (PHQ2-9): Medium Risk (09/12/2021)  Financial Resource Strain: Low Risk  (08/31/2022)   Received from [facility], [facility]  Physical Activity: Inactive (04/04/2020)  Social Connections: Socially Integrated (04/04/2020)  Stress: No Stress Concern Present (04/04/2020)  Tobacco Use: Medium Risk (11/20/2022)    Readmission Risk Interventions     No data to display

## 2022-11-25 DIAGNOSIS — E039 Hypothyroidism, unspecified: Secondary | ICD-10-CM

## 2022-11-25 DIAGNOSIS — W19XXXA Unspecified fall, initial encounter: Secondary | ICD-10-CM | POA: Diagnosis not present

## 2022-11-25 DIAGNOSIS — G4733 Obstructive sleep apnea (adult) (pediatric): Secondary | ICD-10-CM

## 2022-11-25 DIAGNOSIS — F331 Major depressive disorder, recurrent, moderate: Secondary | ICD-10-CM | POA: Diagnosis not present

## 2022-11-25 DIAGNOSIS — R29898 Other symptoms and signs involving the musculoskeletal system: Secondary | ICD-10-CM | POA: Diagnosis not present

## 2022-11-25 DIAGNOSIS — F419 Anxiety disorder, unspecified: Secondary | ICD-10-CM

## 2022-11-25 DIAGNOSIS — E876 Hypokalemia: Secondary | ICD-10-CM

## 2022-11-25 DIAGNOSIS — I10 Essential (primary) hypertension: Secondary | ICD-10-CM | POA: Diagnosis not present

## 2022-11-25 LAB — MAGNESIUM: Magnesium: 2.6 mg/dL — ABNORMAL HIGH (ref 1.7–2.4)

## 2022-11-25 LAB — BASIC METABOLIC PANEL
Anion gap: 11 (ref 5–15)
BUN: 19 mg/dL (ref 8–23)
CO2: 27 mmol/L (ref 22–32)
Calcium: 8.6 mg/dL — ABNORMAL LOW (ref 8.9–10.3)
Chloride: 100 mmol/L (ref 98–111)
Creatinine, Ser: 0.89 mg/dL (ref 0.44–1.00)
GFR, Estimated: >60 mL/min (ref >60)
Glucose, Bld: 92 mg/dL (ref 70–99)
Potassium: 4.3 mmol/L (ref 3.5–5.1)
Sodium: 138 mmol/L (ref 135–145)

## 2022-11-25 LAB — PHOSPHORUS: Phosphorus: 3.8 mg/dL (ref 2.5–4.6)

## 2022-11-25 MED ORDER — DULOXETINE HCL 30 MG PO CPEP
60.0000 mg | ORAL_CAPSULE | Freq: Every day | ORAL | Status: DC
  Administered 2022-11-26 – 2022-11-30 (×5): 60 mg via ORAL
  Filled 2022-11-25 (×5): qty 2

## 2022-11-25 NOTE — Progress Notes (Signed)
Physical Therapy Treatment Patient Details Name: Robin Espinoza MRN: [id number] DOB: [birth year] Today's Date: 11/25/2022   History of Present Illness Patient is a 77 year old female who presented to ED s/p fall at home unresponsive. Patient had weakness and confusion over past four days prior to admission. PMH includes CAD, HTN, hypothyroidism, falls, GERD, obesity, sleep apnea, depression, osteoarthrosis and anxiety. Patient recently discharged from subacute rehab.    PT Comments  Pt found seated in recliner; is overall pleasant and with cues and encouragement willing to work with therapy, putting forth fair effort throughout session. Pt was able to perform STS x3 with Mod A +2, with pt using hallway rails to pull up on to stand. Pt requiring consistent cues and encouragement throughout session to continue to participate. Pt able to stand for ~30 seconds on each stand before having to sit down due to fatigue. On last two stand trials pt encouraged to take a few steps backwards towards chair, pt able to take 1-2 shuffling steps each trial, with cues given for weight shifting. On final stand trial pt able to perform 3-4 low amplitude mini squats before sitting down. Finished session with seated chair exercises, pt educated on importance of mobility in order to improve functional activity tolerance, pt verbalizes understanding. Pt will benefit from continued PT services upon discharge to safely address deficits listed in patient problem list for decreased caregiver assistance and eventual return to PLOF.     If plan is discharge home, recommend the following: A lot of help with bathing/dressing/bathroom;A lot of help with walking and/or transfers;Assistance with cooking/housework;Direct supervision/assist for financial management;Direct supervision/assist for medications management;Assist for transportation;Help with stairs or ramp for entrance;Supervision due to cognitive status   Can travel by private  vehicle     No  Equipment Recommendations  Other (comment) (TBD)    Recommendations for Other Services       Precautions / Restrictions Precautions Precautions: Fall Restrictions Weight Bearing Restrictions: No     Mobility  Bed Mobility               General bed mobility comments: Pt found in recliner at start/end of session    Transfers Overall transfer level: Needs assistance   Transfers: Sit to/from Stand Sit to Stand: +2 physical assistance, Mod assist           General transfer comment: +2 Mod A for physical assist, with pt using hallway hand railing to pull up with. Once standing pt given cues and encouragement; able take 1-2 shuffling steps, with minimal weight shift.    Ambulation/Gait                   Stairs             Wheelchair Mobility     Tilt Bed    Modified Rankin (Stroke Patients Only)       Balance Overall balance assessment: History of Falls, Needs assistance Sitting-balance support: Feet supported, Bilateral upper extremity supported Sitting balance-Leahy Scale: Fair     Standing balance support: During functional activity, Reliant on assistive device for balance, Bilateral upper extremity supported Standing balance-Leahy Scale: Poor Standing balance comment: static standing at hallway hand rail.                            Cognition Arousal: Alert   Overall Cognitive Status: History of cognitive impairments - at baseline  Exercises Total Joint Exercises Ankle Circles/Pumps: AROM, 10 reps, Both, Strengthening Quad Sets: AROM, Strengthening, Both, 10 reps Other Exercises Other Exercises: pt educated on benfits of being OOB, and performing seated LE exercises for improvements in overall funcitonal activities    General Comments        Pertinent Vitals/Pain Pain Assessment Pain Assessment: 0-10 Pain Score: 7  Pain Location: R  wrist, Back Pain Descriptors / Indicators: Aching, Sore, Guarding, Grimacing, Moaning Pain Intervention(s): Monitored during session, Limited activity within patient's tolerance    Home Living                          Prior Function            PT Goals (current goals can now be found in the care plan section) Progress towards PT goals: Progressing toward goals    Frequency    Min 1X/week      PT Plan      Co-evaluation              AM-PAC PT "6 Clicks" Mobility   Outcome Measure  Help needed turning from your back to your side while in a flat bed without using bedrails?: A Lot Help needed moving from lying on your back to sitting on the side of a flat bed without using bedrails?: Total Help needed moving to and from a bed to a chair (including a wheelchair)?: A Lot Help needed standing up from a chair using your arms (e.g., wheelchair or bedside chair)?: A Lot Help needed to walk in hospital room?: A Lot Help needed climbing 3-5 steps with a railing? : Total 6 Click Score: 10    End of Session Equipment Utilized During Treatment: Gait belt Activity Tolerance: Patient limited by fatigue Patient left: in chair;with call bell/phone within reach;with chair alarm set Nurse Communication: Mobility status PT Visit Diagnosis: Unsteadiness on feet (R26.81);Other abnormalities of gait and mobility (R26.89);Repeated falls (R29.6);Muscle weakness (generalized) (M62.81);History of falling (Z91.81);Difficulty in walking, not elsewhere classified (R26.2)     Time: 1610-9604 PT Time Calculation (min) (ACUTE ONLY): 26 min  Charges:                            Cecile Sheerer, SPT 11/25/22, 1:34 PM

## 2022-11-25 NOTE — Progress Notes (Signed)
  Progress Note   Patient: Robin Espinoza DOB: [birth year] DOA: 11/20/2022     5 DOS: the patient was seen and examined on 11/25/2022   Brief hospital course: Robin Espinoza is a 77 y.o. female with medical history significant for CAD, hypertension, hypothyroidism, falls, GERD, obesity, obstructive sleep apnea, depression, anxiety presenting with a fall at home unresponsive.  Positive head trauma, has been feeling dizzy on and off.  Has been notified that she has been intermittently confused and weak past 4 days.  Treated for UTI recently by PCP.  Lab work showed potassium 2.9.  CT imaging of head and neck within normal limits.  Patient is admitted to the hospitalist service for further management evaluation of falls, generalized weakness.  Assessment and Plan: Falls Generalized weakness Presents to the ER for evaluation of weakness and frequent falls At baseline patient ambulates with a rolling walker and states that she had her walker when she fell.  She struck her head but denies any loss of consciousness and was able to call EMS.  She has a life alert. Continue to monitor fall precautions PT recommend return to skilled nursing facility at discharge. She was recently discharged from subacute rehab [facility] consult appreciated.   History of A-fib Continue amiodarone and metoprolol for rate control Increase dose of metoprolol to 75 mg daily Continue Eliquis as primary prophylaxis for an acute stroke   History of depression Dementia Patient has a history of depression but remains asymptomatic. Denies having any suicidal ideation, more confused at night Appreciate psychiatry input, Wellbutrin was discontinued, Abilify 2 mg daily started on Cymbalta increased to 90 mg daily   Hypothyroidism Continue Synthroid but decrease dose to 50 mcg due to increased free T4 levels   Obesity (BMI 38.12 kg/m2) Complicates overall prognosis and care Diet, weight reduction, life style modification  and exercise has been discussed.   Hypokalemia Resolved s/p repletion.  Magnesium levels wnl.   History of UTI No pyuria and asymptomatic Discontinue Rocephin.     Subjective: Patient is seen and examined today morning.  She is sitting in chair, asks for more pain medications.  Reports having muscle aches and pain.  Eating fair, denies abdominal pain, nausea or vomiting.  Physical Exam: Vitals:   11/25/22 0500 11/25/22 0520 11/25/22 0756 11/25/22 0912  BP:  (!) 120/93 (!) 149/87 (!) 122/91  Pulse:  (!) 111 (!) 114 (!) 112  Resp:  18 15 16   Temp:  (!) 97.4 F (36.3 C) (!) 97.5 F (36.4 C) 97.9 F (36.6 C)  TempSrc:  Oral  Oral  SpO2:  98% 100% 99%  Weight: 102 kg     Height:       General - Elderly morbidly obese female, in distress secondary to pain HEENT - PERRLA, EOMI, atraumatic head, non tender sinuses. Lung - Clear, rales, rhonchi, wheezes. Heart - S1, S2 heard, no murmurs, rubs, trace pedal edema Neuro - Alert, awake and oriented x 3, non focal exam. Skin - Warm and dry. Data Reviewed:  There are no new results to review at this time.  Family Communication: discussed with husband over phone, who agrees to decrease pain med dose and frequency.  Disposition: Status is: Inpatient Remains inpatient appropriate because: safe discharge plan  Planned Discharge Destination: Skilled nursing facility    Time spent: 43 minutes  Author: Marcelino Duster, MD 11/25/2022 1:52 PM  For on call review [email or web address].

## 2022-11-25 NOTE — Progress Notes (Signed)
Physical Therapy Treatment Patient Details Name: Robin Espinoza MRN: [id number] DOB: [birth year] Today's Date: 11/25/2022   History of Present Illness Patient is a 77 year old female who presented to ED s/p fall at home unresponsive. Patient had weakness and confusion over past four days prior to admission. PMH includes CAD, HTN, hypothyroidism, falls, GERD, obesity, sleep apnea, depression, osteoarthrosis and anxiety. Patient recently discharged from subacute rehab.    PT Comments  Pt in recliner, initially with significant retro lean, struggled to weight shift forward to get feet on floor, don gait belt or initiate movement toward upright sitting.  Heavy cuing and encouragement to actually attain.  Pt needed a lot of cuing for set up, sequencing and generally to actually attempt rising to standing as well as maintain standing.  She very much struggled with coordinating any shuffling steps to turn toward bed needing direct assist and heavy cuing for minimal heel-toe shuffling.  Ultimately heavily assisted return to sitting and then supine after the effort.  Pt will benefit from continued PT per POC.      If plan is discharge home, recommend the following: A lot of help with bathing/dressing/bathroom;A lot of help with walking and/or transfers;Assistance with cooking/housework;Direct supervision/assist for financial management;Direct supervision/assist for medications management;Assist for transportation;Help with stairs or ramp for entrance;Supervision due to cognitive status   Can travel by private vehicle     No  Equipment Recommendations   (TBD)    Recommendations for Other Services       Precautions / Restrictions Precautions Precautions: Fall Restrictions Weight Bearing Restrictions: No     Mobility  Bed Mobility Overal bed mobility: Needs Assistance Bed Mobility: Sit to Supine       Sit to supine: Max assist        Transfers Overall transfer level: Needs  assistance Equipment used: Rolling walker (2 wheels) Transfers: Sit to/from Stand Sit to Stand: Max assist           General transfer comment: heavy assist with set up and cuing for appropriate weight shift and UE/walker use    Ambulation/Gait               General Gait Details: No true steps/ambulation.  Pt was able to manage a few very effortful and limited side steps from recliner to bed, ultimately could not coordinate fully turning 90* and needed maxA to complete "walk" transfer to safely square up to bed and sit   Stairs             Wheelchair Mobility     Tilt Bed    Modified Rankin (Stroke Patients Only)       Balance Overall balance assessment: History of Falls, Needs assistance Sitting-balance support: Feet supported, Bilateral upper extremity supported Sitting balance-Leahy Scale: Fair   Postural control: Posterior lean Standing balance support: During functional activity, Reliant on assistive device for balance, Bilateral upper extremity supported Standing balance-Leahy Scale: Poor Standing balance comment: once assited to weightshift over walker she did manage to maintain static balance resonably well, devolves quickly with weight shift attempts                            Cognition Arousal: Alert Behavior During Therapy: Anxious Overall Cognitive Status: History of cognitive impairments - at baseline  Exercises      General Comments        Pertinent Vitals/Pain Pain Assessment Pain Location: sore back    Home Living                          Prior Function            PT Goals (current goals can now be found in the care plan section) Progress towards PT goals: Progressing toward goals    Frequency    Min 1X/week      PT Plan      Co-evaluation              AM-PAC PT "6 Clicks" Mobility   Outcome Measure  Help needed turning from  your back to your side while in a flat bed without using bedrails?: A Lot Help needed moving from lying on your back to sitting on the side of a flat bed without using bedrails?: A Lot Help needed moving to and from a bed to a chair (including a wheelchair)?: A Lot Help needed standing up from a chair using your arms (e.g., wheelchair or bedside chair)?: A Lot Help needed to walk in hospital room?: Total Help needed climbing 3-5 steps with a railing? : Total 6 Click Score: 10    End of Session Equipment Utilized During Treatment: Gait belt Activity Tolerance: Patient limited by fatigue Patient left: in bed;with call bell/phone within reach;with nursing/sitter in room Nurse Communication: Mobility status PT Visit Diagnosis: Unsteadiness on feet (R26.81);Other abnormalities of gait and mobility (R26.89);Repeated falls (R29.6);Muscle weakness (generalized) (M62.81);History of falling (Z91.81);Difficulty in walking, not elsewhere classified (R26.2)     Time: 1640-1650 PT Time Calculation (min) (ACUTE ONLY): 10 min  Charges:    $Therapeutic Activity: 8-22 mins PT General Charges $$ ACUTE PT VISIT: 1 Visit                     Malachi Pro, DPT 11/25/2022, 5:24 PM

## 2022-11-25 NOTE — Plan of Care (Signed)

## 2022-11-25 NOTE — Consult Note (Signed)
PHARMACY CONSULT NOTE - ELECTROLYTES  Pharmacy Consult for Electrolyte Monitoring and Replacement   Recent Labs: Height: 5\' 5"  (165.1 cm) Weight: 102 kg (224 lb 13.9 oz) IBW/kg (Calculated) : 57 Estimated Creatinine Clearance: 62.7 mL/min (by C-G formula based on SCr of 0.89 mg/dL). Potassium (mmol/L)  Date Value  11/25/2022 4.3  05/03/2012 3.7   Magnesium (mg/dL)  Date Value  42/59/5638 2.6 (H)   Calcium (mg/dL)  Date Value  75/64/3329 8.6 (L)   Calcium, Total (mg/dL)  Date Value  51/88/4166 9.0   Albumin (g/dL)  Date Value  09/20/1599 3.2 (L)  01/30/2021 4.0  05/02/2012 3.4   Phosphorus (mg/dL)  Date Value  09/32/3557 3.8   Sodium  Date Value  11/25/2022 138 mmol/L  10/19/2022 141  05/03/2012 138 mmol/L   Corrected Ca: 8.5 mg/dL  Assessment  Robin Espinoza is a 77 y.o. female presenting with increasing weakness/confusion,  Afib w/ RVR. PMH significant for CAD/Afib. Pharmacy has been consulted to monitor and replace electrolytes.  Goal of Therapy: K closer to 4, Mag 2 given cardiac hx  Plan:  No electrolyte replacement warranted today Check BMP in AM  Thank you for involving pharmacy in this patient's care.   Rockwell Alexandria, PharmD Clinical Pharmacist 11/25/2022 7:24 AM

## 2022-11-25 NOTE — TOC Progression Note (Signed)
Transition of Care [facility]) - Progression Note    Patient Details  Name: Robin Espinoza MRN: [id number] Date of Birth: 24-Apr-1945  [facility] CM/SW Contact  Allena Katz, LCSW Phone Number: 11/25/2022, 11:58 AM  Clinical Narrative:   CSW informed pt that she will be in her Copay days if she wants to go to rehab. Pt reports she wants to discuss with her husband and get back to me.          Expected Discharge Plan and Services                                               Social Determinants of Health (SDOH) Interventions SDOH Screenings   Food Insecurity: No Food Insecurity (11/21/2022)  Housing: Low Risk  (11/21/2022)  Transportation Needs: No Transportation Needs (11/21/2022)  Utilities: Not At Risk (11/21/2022)  Alcohol Screen: Low Risk  (09/12/2021)  Depression (PHQ2-9): Medium Risk (09/12/2021)  Financial Resource Strain: Low Risk  (08/31/2022)   [facility] from [facility], [facility]  Physical Activity: Inactive (04/04/2020)  Social Connections: Socially Integrated (04/04/2020)  Stress: No Stress Concern Present (04/04/2020)  Tobacco Use: Medium Risk (11/20/2022)    Readmission Risk Interventions     No data to display

## 2022-11-26 DIAGNOSIS — I10 Essential (primary) hypertension: Secondary | ICD-10-CM | POA: Diagnosis not present

## 2022-11-26 DIAGNOSIS — F331 Major depressive disorder, recurrent, moderate: Secondary | ICD-10-CM | POA: Diagnosis not present

## 2022-11-26 DIAGNOSIS — R29898 Other symptoms and signs involving the musculoskeletal system: Secondary | ICD-10-CM | POA: Diagnosis not present

## 2022-11-26 DIAGNOSIS — W19XXXA Unspecified fall, initial encounter: Secondary | ICD-10-CM | POA: Diagnosis not present

## 2022-11-26 LAB — BASIC METABOLIC PANEL
Anion gap: 8 (ref 5–15)
BUN: 20 mg/dL (ref 8–23)
CO2: 29 mmol/L (ref 22–32)
Calcium: 8.4 mg/dL — ABNORMAL LOW (ref 8.9–10.3)
Chloride: 100 mmol/L (ref 98–111)
Creatinine, Ser: 0.97 mg/dL (ref 0.44–1.00)
GFR, Estimated: >60 mL/min (ref >60)
Glucose, Bld: 87 mg/dL (ref 70–99)
Potassium: 4.4 mmol/L (ref 3.5–5.1)
Sodium: 137 mmol/L (ref 135–145)

## 2022-11-26 LAB — CULTURE, BLOOD (ROUTINE X 2)
Culture: NO GROWTH
Culture: NO GROWTH
Special Requests: ADEQUATE

## 2022-11-26 MED ORDER — DOCUSATE SODIUM 100 MG PO CAPS
100.0000 mg | ORAL_CAPSULE | Freq: Two times a day (BID) | ORAL | Status: DC
  Administered 2022-11-26 – 2022-11-30 (×9): 100 mg via ORAL
  Filled 2022-11-26 (×9): qty 1

## 2022-11-26 MED ORDER — SENNA 8.6 MG PO TABS
2.0000 | ORAL_TABLET | Freq: Every day | ORAL | Status: DC
  Administered 2022-11-26 – 2022-11-29 (×4): 17.2 mg via ORAL
  Filled 2022-11-26 (×4): qty 2

## 2022-11-26 MED ORDER — BISACODYL 10 MG RE SUPP
10.0000 mg | Freq: Once | RECTAL | Status: AC
  Administered 2022-11-26: 10 mg via RECTAL
  Filled 2022-11-26: qty 1

## 2022-11-26 NOTE — Consult Note (Signed)
PHARMACY CONSULT NOTE - ELECTROLYTES  Pharmacy Consult for Electrolyte Monitoring and Replacement   Recent Labs: Height: 5\' 5"  (165.1 cm) Weight: 103.3 kg (227 lb 11.8 oz) IBW/kg (Calculated) : 57 Estimated Creatinine Clearance: 57.9 mL/min (by C-G formula based on SCr of 0.97 mg/dL). Potassium (mmol/L)  Date Value  11/26/2022 4.4  05/03/2012 3.7   Magnesium (mg/dL)  Date Value  09/81/1914 2.6 (H)   Calcium (mg/dL)  Date Value  78/29/5621 8.4 (L)   Calcium, Total (mg/dL)  Date Value  30/86/5784 9.0   Albumin (g/dL)  Date Value  69/62/9528 3.2 (L)  01/30/2021 4.0  05/02/2012 3.4   Phosphorus (mg/dL)  Date Value  41/32/4401 3.8   Sodium  Date Value  11/26/2022 137 mmol/L  10/19/2022 141  05/03/2012 138 mmol/L   Corrected Ca: 8.6 mg/dL  Assessment  Robin Espinoza is a 77 y.o. female presenting with increasing weakness/confusion,  Afib w/ RVR. PMH significant for CAD/Afib. Pharmacy has been consulted to monitor and replace electrolytes.  Goal of Therapy: K closer to 4, Mag 2 given cardiac hx  Plan:  No electrolyte replacement warranted today Check BMP, Phos, Mg in AM  Thank you for involving pharmacy in this patient's care.   Rockwell Alexandria, PharmD Clinical Pharmacist 11/26/2022 7:19 AM

## 2022-11-26 NOTE — Progress Notes (Signed)
  Progress Note   Patient: Robin Espinoza YQM:[id number] DOB: 21-Mar-1946 DOA: 11/20/2022     6 DOS: the patient was seen and examined on 11/26/2022   Brief hospital course: Robin Espinoza is a 77 y.o. female with medical history significant for CAD, hypertension, hypothyroidism, falls, GERD, obesity, obstructive sleep apnea, depression, anxiety presenting with a fall at home unresponsive.  Positive head trauma, has been feeling dizzy on and off.  Has been notified that she has been intermittently confused and weak past 4 days.  Treated for UTI recently by PCP.  Lab work showed potassium 2.9.  CT imaging of head and neck within normal limits.  Patient is admitted to the hospitalist service for further management evaluation of falls, generalized weakness.  Assessment and Plan: Frequent falls Generalized weakness Recent discharge from subacute rehab. Continue to monitor fall precautions PT recommend return to skilled nursing facility at discharge. TOC consult appreciated, she may go to SNF tomorrow.   History of A-fib Continue amiodarone and metoprolol to 75 mg daily Continue Eliquis therapy.   History of depression Dementia Patient has a history of depression but remains asymptomatic. Denies having any suicidal ideation, more confused at night Appreciate psychiatry input, Wellbutrin was discontinued, Abilify 2 mg daily Husband worried about Cymbalta increased dose, and attributes her confusion to Cymbalta. I went down to 60mg  home dose.   Hypothyroidism Continue Synthroid 50 mcg due to increased free T4 levels   Obesity (BMI 38.12 kg/m2) Diet, weight reduction, life style modification and exercise has been discussed.   Hypokalemia Resolved s/p repletion.  Magnesium levels wnl.   History of UTI No pyuria and asymptomatic Discontinued Rocephin.     Subjective: Patient is seen and examined today morning.  She is lying in bed. Husband at bedside states that she is at times confused,  had hallucinations.  She is eating fair, advised to work with PT.Marland Kitchen  Physical Exam: Vitals:   11/26/22 0825 11/26/22 0837 11/26/22 0900 11/26/22 1637  BP: 113/70   (!) 138/117  Pulse:  (!) 115 100 77  Resp: 19   19  Temp: (!) 97.3 F (36.3 C)   (!) 97.5 F (36.4 C)  TempSrc:    Oral  SpO2:  90%  (!) 80%  Weight:      Height:       General - Elderly morbidly obese female,no distress noted HEENT - PERRLA, EOMI, atraumatic head, non tender sinuses. Lung - Clear, rales, rhonchi, wheezes. Heart - S1, S2 heard, no murmurs, rubs, trace pedal edema. Abdomen - soft, non tender, obese. Neuro - Alert, awake and oriented x 3, non focal exam. Skin - Warm and dry. Data Reviewed:  There are no new results to review at this time.  Family Communication: discussed with husband at bedside, understands current care plan.  Disposition: Status is: Inpatient Remains inpatient appropriate because: safe discharge plan  Planned Discharge Destination: Skilled nursing facility    Time spent: 41 minutes  Author: Marcelino Duster, MD 11/26/2022 5:12 PM  For on call review [email or web address].

## 2022-11-26 NOTE — Care Management Important Message (Signed)
Important Message  Patient Details  Name: Robin Espinoza MRN: [id number] Date of Birth: November 01, 1945   Medicare Important Message Given:  Yes     [name] A Glendell Fouse 11/26/2022, 12:35 PM

## 2022-11-26 NOTE — Plan of Care (Signed)

## 2022-11-26 NOTE — Progress Notes (Signed)
PT Cancellation Note  Patient Details Name: Robin Espinoza MRN: [id number] DOB: October 27, 1945   Cancelled Treatment:     Therapist in to see pt who was resting in bed and declined any activity. Will re-attempt next available date/time per POC.    MARGARITE FESER 11/26/2022, 4:28 PM

## 2022-11-26 NOTE — TOC Progression Note (Addendum)
Transition of Care [facility]) - Progression Note    Patient Details  Name: Robin Espinoza MRN: [id number] Date of Birth: 1945/11/19  [facility] CM/SW Contact  Allena Katz, LCSW Phone Number: 11/26/2022, 12:24 PM  Clinical Narrative:   CSW spoke with pt to explain copays at rehab in depth. CSW explained her period of wellness and that she had not met that yet. Pt understands she has to pay copays. [facility] but [facility] now unable to take. CSW will send out referral to additional places and speak with pt.         Expected Discharge Plan and Services                                               Social Determinants of Health (SDOH) Interventions SDOH Screenings   Food Insecurity: No Food Insecurity (11/21/2022)  Housing: Low Risk  (11/21/2022)  Transportation Needs: No Transportation Needs (11/21/2022)  Utilities: Not At Risk (11/21/2022)  Alcohol Screen: Low Risk  (09/12/2021)  Depression (PHQ2-9): Medium Risk (09/12/2021)  Financial Resource Strain: Low Risk  (08/31/2022)   [facility], [facility]  Physical Activity: Inactive (04/04/2020)  Social Connections: Socially Integrated (04/04/2020)  Stress: No Stress Concern Present (04/04/2020)  Tobacco Use: Medium Risk (11/20/2022)    Readmission Risk Interventions     No data to display

## 2022-11-27 DIAGNOSIS — R29898 Other symptoms and signs involving the musculoskeletal system: Secondary | ICD-10-CM | POA: Diagnosis not present

## 2022-11-27 DIAGNOSIS — F331 Major depressive disorder, recurrent, moderate: Secondary | ICD-10-CM | POA: Diagnosis not present

## 2022-11-27 DIAGNOSIS — I10 Essential (primary) hypertension: Secondary | ICD-10-CM | POA: Diagnosis not present

## 2022-11-27 DIAGNOSIS — W19XXXA Unspecified fall, initial encounter: Secondary | ICD-10-CM | POA: Diagnosis not present

## 2022-11-27 NOTE — TOC Progression Note (Addendum)
Transition of Care [facility]) - Progression Note    Patient Details  Name: Robin Espinoza MRN: [id number] Date of Birth: 06-17-1945  Transition of Care [facility]) CM/SW Contact  Liliana Cline, LCSW Phone Number: 11/27/2022, 1:57 PM  Clinical Narrative:    Met with patient at bedside. Provided list of current bed offers. Patient would like to talk to her husband to decide between [facility] and [facility]. CSW offered to call husband, patient declined. Patient requested CSW ask both facilities what they copay would be. Spoke to Tammy at [facility] and Tiffany at [facility] then informed patient that their estimated copays and that [facility] requires 7 days up front and [facility] requires 14 days up front. Patient agreed to talk to her husband when he comes today. Explained that we do need a decision so that insurance auth can be obtained.   2:50- Went by room, husband is not here yet.   4:00- Asked Nurse to notify CSW when husband is here.       Expected Discharge Plan and Services                                               Social Determinants of Health (SDOH) Interventions SDOH Screenings   Food Insecurity: No Food Insecurity (11/21/2022)  Housing: Low Risk  (11/21/2022)  Transportation Needs: No Transportation Needs (11/21/2022)  Utilities: Not At Risk (11/21/2022)  Alcohol Screen: Low Risk  (09/12/2021)  Depression (PHQ2-9): Medium Risk (09/12/2021)  Financial Resource Strain: Low Risk  (08/31/2022)   Received from [facility], [facility]  Physical Activity: Inactive (04/04/2020)  Social Connections: Socially Integrated (04/04/2020)  Stress: No Stress Concern Present (04/04/2020)  Tobacco Use: Medium Risk (11/20/2022)    Readmission Risk Interventions     No data to display

## 2022-11-27 NOTE — Progress Notes (Signed)
Physical Therapy Treatment Patient Details Name: Robin Espinoza MRN: [id number] DOB: April 09, 1945 Today's Date: 11/27/2022   History of Present Illness Patient is a 77 year old female who presented to ED s/p fall at home unresponsive. Patient had weakness and confusion over past four days prior to admission. PMH includes CAD, HTN, hypothyroidism, falls, GERD, obesity, sleep apnea, depression, osteoarthrosis and anxiety. Patient recently discharged from subacute rehab.    PT Comments  Pt received in bed ready to work with PT. Pt able to follow simple commands with increased time. MaxA to transfer to EOB due to weakness and inability to use R UE due to chronic shoulder pain. ModA initially to attain upright sitting posture and maintain with all extrems supported. Pt sat EOB ~5 minutes with Supervision. Attempted to transfer via side steps with RW to bedside chair. Pt struggled to attain upright posture and weight shift to advance steps in standing. Pt assisted back to sitting on bed, RW removed and stand pivot bed to chair completed with ModA. Pt able to stand upright with this technique and safely complete transfer to chair. Nursing notified of technique and benefits of utilizing Sit to Stand lift for transfers. Pt will benefit from getting OOB daily and/or sitting EOB for meals to regain strength and endurance.    If plan is discharge home, recommend the following: A lot of help with bathing/dressing/bathroom;A lot of help with walking and/or transfers;Assistance with cooking/housework;Direct supervision/assist for financial management;Direct supervision/assist for medications management;Assist for transportation;Help with stairs or ramp for entrance;Supervision due to cognitive status   Can travel by private vehicle     No  Equipment Recommendations       Recommendations for Other Services       Precautions / Restrictions Precautions Precautions: Fall Precaution Comments: Fearful of falling,  cognitive deficits Restrictions Weight Bearing Restrictions: No     Mobility  Bed Mobility Overal bed mobility: Needs Assistance Bed Mobility: Supine to Sit     Supine to sit: Max assist, HOB elevated     General bed mobility comments: Use of bed linens to scoot pt to edge of bed. Pt unable to assist with R UE    Transfers Overall transfer level: Needs assistance Equipment used: Rolling walker (2 wheels) Transfers: Sit to/from Stand, Bed to chair/wheelchair/BSC Sit to Stand: Max assist   Step pivot transfers: Mod assist, Max assist       General transfer comment: Once standing at RW heavy manual/tactile cues for upright posture and weight shifting    Ambulation/Gait               General Gait Details: Attempted side stepping to Left with RW to chair, pt struggled to weight shift completing only a few shuffling steps, however not enough to attain close proximity to chair   Stairs             Wheelchair Mobility     Tilt Bed    Modified Rankin (Stroke Patients Only)       Balance Overall balance assessment: History of Falls, Needs assistance Sitting-balance support: Feet supported, Bilateral upper extremity supported Sitting balance-Leahy Scale: Fair Sitting balance - Comments: ModA initially to correct L lateral and posterior lean Postural control: Posterior lean Standing balance support: During functional activity, Reliant on assistive device for balance, Bilateral upper extremity supported Standing balance-Leahy Scale: Poor Standing balance comment: Pt stood for 1 minute at RW with flexed trunk and B knees  High Level Balance Comments:  (Pt completed seated trunk strengthening to attain upright posture and maintain midline x 5 minutes EOB)            Cognition Arousal: Alert Behavior During Therapy: WFL for tasks assessed/performed Overall Cognitive Status: History of cognitive impairments - at baseline                                  General Comments: Simple step commands with increased time to complete        Exercises Total Joint Exercises Ankle Circles/Pumps: AROM, Both, 10 reps, Seated General Exercises - Lower Extremity Long Arc Quad: AROM, Both, 10 reps, Seated Hip Flexion/Marching: AROM, Left, AAROM, Right, Both, 5 reps, Seated Other Exercises Other Exercises: pt educated on benfits of being OOB, and performing seated LE exercises for improvements in overall funcitonal activities    General Comments General comments (skin integrity, edema, etc.): Pt appears motivated to increase strenth and mobility, cognitive deficits and fear of falling affecting progress acutely      Pertinent Vitals/Pain Pain Assessment Pain Assessment: 0-10 Pain Score: 7  Pain Location: R shoulder Arthritis Pain Descriptors / Indicators: Aching, Discomfort, Sore, Grimacing Pain Intervention(s): Patient requesting pain meds-RN notified, Repositioned    Home Living                          Prior Function            PT Goals (current goals can now be found in the care plan section) Acute Rehab PT Goals Patient Stated Goal: to not be as weak. Progress towards PT goals: Progressing toward goals    Frequency    Min 1X/week      PT Plan      Co-evaluation              AM-PAC PT "6 Clicks" Mobility   Outcome Measure  Help needed turning from your back to your side while in a flat bed without using bedrails?: A Lot Help needed moving from lying on your back to sitting on the side of a flat bed without using bedrails?: A Lot Help needed moving to and from a bed to a chair (including a wheelchair)?: A Lot Help needed standing up from a chair using your arms (e.g., wheelchair or bedside chair)?: A Lot Help needed to walk in hospital room?: Total Help needed climbing 3-5 steps with a railing? : Total 6 Click Score: 10    End of Session Equipment Utilized During Treatment:  Gait belt Activity Tolerance: Patient tolerated treatment well Patient left: in chair;with call bell/phone within reach;with chair alarm set Nurse Communication: Mobility status;Need for lift equipment (Possible sit to stand lift for safe nursing transfers bed<>chair) PT Visit Diagnosis: Unsteadiness on feet (R26.81);Other abnormalities of gait and mobility (R26.89);Repeated falls (R29.6);Muscle weakness (generalized) (M62.81);History of falling (Z91.81);Difficulty in walking, not elsewhere classified (R26.2)     Time: 1117-1205 PT Time Calculation (min) (ACUTE ONLY): 48 min  Charges:    $Therapeutic Exercise: 8-22 mins $Therapeutic Activity: 23-37 mins PT General Charges $$ ACUTE PT VISIT: 1 Visit                    Robin Espinoza, PTA  Robin Espinoza 11/27/2022, 1:38 PM

## 2022-11-27 NOTE — Plan of Care (Signed)

## 2022-11-27 NOTE — Progress Notes (Signed)
  Progress Note   Patient: Robin Espinoza QIH:[id number] DOB: 1945/12/21 DOA: 11/20/2022     7 DOS: the patient was seen and examined on 11/27/2022   Brief hospital course: DONNALYNN SIRLES is a 77 y.o. female with medical history significant for CAD, hypertension, hypothyroidism, falls, GERD, obesity, obstructive sleep apnea, depression, anxiety presenting with a fall at home unresponsive.  Positive head trauma, has been feeling dizzy on and off.  Has been notified that she has been intermittently confused and weak past 4 days.  Treated for UTI recently by PCP.  Lab work showed potassium 2.9.  CT imaging of head and neck within normal limits.  Patient is admitted to the hospitalist service for further management evaluation of falls, generalized weakness.  Assessment and Plan: Frequent falls Generalized weakness Recent discharge from subacute rehab. Continue to monitor fall precautions PT recommend return to skilled nursing facility at discharge. TOC working on rehab, Visual merchandiser.   History of A-fib Continue amiodarone and metoprolol to 75 mg daily Continue Eliquis therapy.   History of depression Dementia Denies having any suicidal ideation, more confused at night Appreciate psychiatry input, Wellbutrin was discontinued, Abilify 2 mg daily Husband worried about Cymbalta increased dose, and attributes her confusion to Cymbalta.  Continue 60mg  home dose.   Hypothyroidism Continue Synthroid decreased dose of 50 mcg due to increased free T4 levels.   Obesity (BMI 38.12 kg/m2) Diet, weight reduction, life style modification and exercise has been discussed.   Hypokalemia Resolved s/p repletion.  Magnesium levels wnl.   History of UTI No pyuria and asymptomatic Discontinued Rocephin.     Subjective: Patient is seen and examined today morning.  She does not feel good, did not get out of bed. No family at bedside. She is eating fair, encouraged incentive spirometry.  Physical  Exam: Vitals:   11/26/22 1956 11/27/22 0409 11/27/22 0500 11/27/22 0738  BP: 106/68 (!) 126/102  (!) 131/92  Pulse: 99   79  Resp: 18 18  16   Temp: (!) 97.5 F (36.4 C) 98.1 F (36.7 C)  (!) 97.5 F (36.4 C)  TempSrc:      SpO2: 97% 100%  98%  Weight:   98.9 kg   Height:       General - Elderly morbidly obese female, lying, no distress noted HEENT - PERRLA, EOMI, atraumatic head, non tender sinuses. Lung - Clear, rales, rhonchi, wheezes. Heart - S1, S2 heard, no murmurs, rubs, trace pedal edema. Abdomen - soft, non tender, obese. Neuro - Alert, awake and oriented x 3, non focal exam. Skin - Warm and dry. Data Reviewed:  There are no new results to review at this time.  Family Communication: discussed with husband at bedside, understands current care plan.  Disposition: Status is: Inpatient Remains inpatient appropriate because: safe discharge plan  Planned Discharge Destination: Skilled nursing facility, pending patient decision, insurance auth.    Time spent: 40 minutes  Author: Marcelino Duster, MD 11/27/2022 3:02 PM  For on call review [email or web address].

## 2022-11-28 DIAGNOSIS — F331 Major depressive disorder, recurrent, moderate: Secondary | ICD-10-CM | POA: Diagnosis not present

## 2022-11-28 DIAGNOSIS — G4733 Obstructive sleep apnea (adult) (pediatric): Secondary | ICD-10-CM | POA: Diagnosis not present

## 2022-11-28 DIAGNOSIS — R29898 Other symptoms and signs involving the musculoskeletal system: Secondary | ICD-10-CM | POA: Diagnosis not present

## 2022-11-28 DIAGNOSIS — W19XXXA Unspecified fall, initial encounter: Secondary | ICD-10-CM | POA: Diagnosis not present

## 2022-11-28 MED ORDER — CELECOXIB 100 MG PO CAPS
100.0000 mg | ORAL_CAPSULE | Freq: Two times a day (BID) | ORAL | Status: DC
  Administered 2022-11-28 – 2022-11-30 (×4): 100 mg via ORAL
  Filled 2022-11-28 (×4): qty 1

## 2022-11-28 NOTE — TOC Progression Note (Addendum)
Transition of Care [facility]) - Progression Note    Patient Details  Name: Robin Espinoza MRN: [id number] Date of Birth: Aug 03, 1945  Transition of Care [facility]) CM/SW Contact  Liliana Cline, LCSW Phone Number: 11/28/2022, 3:40 PM  Clinical Narrative:    Met with patient at bedside. Patient states she has spoken with her husband and wants to accept the bed offer at [facility]. CSW notified Tiffany at [facility] and asked to confirm patient can come Monday - awaiting response. CSW called [employer] and spoke to Cameron to start auth for SNF and [facility].         Expected Discharge Plan and Services                                               Social Determinants of Health (SDOH) Interventions SDOH Screenings   Food Insecurity: No Food Insecurity (11/21/2022)  Housing: Low Risk  (11/21/2022)  Transportation Needs: No Transportation Needs (11/21/2022)  Utilities: Not At Risk (11/21/2022)  Alcohol Screen: Low Risk  (09/12/2021)  Depression (PHQ2-9): Medium Risk (09/12/2021)  Financial Resource Strain: Low Risk  (08/31/2022)   Received from [facility], [facility]  Physical Activity: Inactive (04/04/2020)  Social Connections: Socially Integrated (04/04/2020)  Stress: No Stress Concern Present (04/04/2020)  Tobacco Use: Medium Risk (11/20/2022)    Readmission Risk Interventions     No data to display

## 2022-11-28 NOTE — Plan of Care (Signed)
  Problem: Education: Goal: Knowledge of General Education information will improve Description: Including pain rating scale, medication(s)/side effects and non-pharmacologic comfort measures Outcome: Progressing   Problem: Health Behavior/Discharge Planning: Goal: Ability to manage health-related needs will improve Outcome: Progressing   Problem: Clinical Measurements: Goal: Will remain free from infection Outcome: Progressing Goal: Cardiovascular complication will be avoided Outcome: Progressing   

## 2022-11-28 NOTE — Progress Notes (Signed)
  Progress Note   Patient: Robin Espinoza:[id number] DOB: November 12, 1945 DOA: 11/20/2022     8 DOS: the patient was seen and examined on 11/28/2022   Brief hospital course: Robin Espinoza is a 77 y.o. female with medical history significant for CAD, hypertension, hypothyroidism, falls, GERD, obesity, obstructive sleep apnea, depression, anxiety presenting with a fall at home unresponsive.  Positive head trauma, has been feeling dizzy on and off.  Has been notified that she has been intermittently confused and weak past 4 days.  Treated for UTI recently by PCP.  Lab work showed potassium 2.9.  CT imaging of head and neck within normal limits.  Patient is admitted to the hospitalist service for further management evaluation of falls, generalized weakness.  Assessment and Plan: Frequent falls Generalized weakness Recent discharge from subacute rehab. Continue to monitor fall precautions PT recommend return to skilled nursing facility at discharge. TOC working on rehab, pending patient pick of facility, insurance auth.  Left shoulder pain- Due to arthritis, continue pain control. Advised to avoid opiates, continue tylenol PRN. She wants home Celebrex, restarted at 100mg  bid dose.   History of A-fib Continue amiodarone and metoprolol to 75 mg daily Continue Eliquis therapy.   History of depression Dementia Denies having any suicidal ideation, more confused at night Appreciate psychiatry input, Wellbutrin was discontinued, Abilify 2 mg daily Husband worried about Cymbalta increased dose, and attributes her confusion to Cymbalta.  Continue 60mg  home dose.   Hypothyroidism Continue Synthroid decreased dose of 50 mcg due to increased free T4 levels.   Obesity (BMI 38.12 kg/m2) Diet, weight reduction, life style modification and exercise has been discussed.   Hypokalemia Resolved s/p repletion.  Magnesium levels wnl.   History of UTI No pyuria and asymptomatic Discontinued  Rocephin.       Subjective: Patient is seen and examined today morning.  She does complain of left shoulder pain due to her arthritis, asks for Celebrex. Advised to avoid Opiates and try tylenol. No family at bedside. She is eating poor. RN at bedside giving her meds.  Physical Exam: Vitals:   11/27/22 1959 11/28/22 0122 11/28/22 0455 11/28/22 0754  BP: (!) 139/92  (!) 118/96 (!) 137/102  Pulse: (!) 49  65 79  Resp: 20  16 16   Temp: 98 F (36.7 C)  98 F (36.7 C) 98.1 F (36.7 C)  TempSrc: Oral  Oral   SpO2: 90%  97% 100%  Weight:  101.2 kg    Height:       General - Elderly morbidly obese female, lying, no distress noted HEENT - PERRLA, EOMI, atraumatic head, non tender sinuses. Lung - Clear, rales, rhonchi, wheezes. Heart - S1, S2 heard, no murmurs, rubs, trace pedal edema. Abdomen - soft, non tender, obese. Neuro - Alert, awake and oriented x 3, non focal exam. Skin - Warm and dry. Data Reviewed:  There are no new results to review at this time.  Family Communication: discussed with husband at bedside, understands current care plan.  Disposition: Status is: Inpatient Remains inpatient appropriate because: safe discharge plan  Planned Discharge Destination: Skilled nursing facility, pending patient decision, insurance auth.    Time spent: 39 minutes  Author: Marcelino Duster, MD 11/28/2022 12:30 PM  For on call review [email or web address].

## 2022-11-29 DIAGNOSIS — W19XXXA Unspecified fall, initial encounter: Secondary | ICD-10-CM | POA: Diagnosis not present

## 2022-11-29 DIAGNOSIS — G4733 Obstructive sleep apnea (adult) (pediatric): Secondary | ICD-10-CM | POA: Diagnosis not present

## 2022-11-29 DIAGNOSIS — R29898 Other symptoms and signs involving the musculoskeletal system: Secondary | ICD-10-CM | POA: Diagnosis not present

## 2022-11-29 DIAGNOSIS — F331 Major depressive disorder, recurrent, moderate: Secondary | ICD-10-CM | POA: Diagnosis not present

## 2022-11-29 NOTE — Progress Notes (Signed)
Physical Therapy Treatment Patient Details Name: Robin Espinoza MRN: [id number] DOB: 04/13/1945 Today's Date: 11/29/2022   History of Present Illness Patient is a 77 year old female who presented to ED s/p fall at home unresponsive. Patient had weakness and confusion over past four days prior to admission. PMH includes CAD, HTN, hypothyroidism, falls, GERD, obesity, sleep apnea, depression, osteoarthrosis and anxiety. Patient recently discharged from subacute rehab.    PT Comments  Pt seen for PT tx with pt agreeable. Pt requires max assist for bed mobility, mod<>max assist for STS but pt is making progress with gait as she is able to ambulate short distances in room with min assist + chair follow for seated rest breaks. Pt reports she is anticipating d/c to SNF rehab, which is appropriate at this time.    If plan is discharge home, recommend the following: A lot of help with bathing/dressing/bathroom;A lot of help with walking and/or transfers;Assistance with cooking/housework;Direct supervision/assist for financial management;Direct supervision/assist for medications management;Assist for transportation;Help with stairs or ramp for entrance;Supervision due to cognitive status   Can travel by private vehicle     No  Equipment Recommendations  None recommended by PT (defer to next venue)    Recommendations for Other Services       Precautions / Restrictions Precautions Precautions: Fall Precaution Comments: Fearful of falling, cognitive deficits Restrictions Weight Bearing Restrictions: No     Mobility  Bed Mobility Overal bed mobility: Needs Assistance Bed Mobility: Supine to Sit     Supine to sit: Max assist, HOB elevated, Used rails     General bed mobility comments: max assist/cuing, pt requires assistance to move BLE to EOB, upright trunk 2/2 limited ability to push with either BUE    Transfers Overall transfer level: Needs assistance Equipment used: Rolling walker (2  wheels) Transfers: Sit to/from Stand, Bed to chair/wheelchair/BSC Sit to Stand: Mod assist, Max assist (mod assist STS from elevated EOB, max assist STS from recliner, cuing for hand placement & anterior weight shifting, narrow vs normal BOS, cuing & max assist to scoot hips out to edge of seat with lateral leans, extra time to power up to standing)   Step pivot transfers: Mod assist (extra time to pivot bed>recliner on L)            Ambulation/Gait Ambulation/Gait assistance: Min assist (PT also providing chair follow for safety) Gait Distance (Feet):  (8 ft + 14 ft) Assistive device: Rolling walker (2 wheels) Gait Pattern/deviations: Decreased step length - left, Decreased step length - right, Decreased dorsiflexion - right, Decreased dorsiflexion - left, Decreased stride length, Narrow base of support, Trunk flexed Gait velocity: decreased     General Gait Details: PT provides chair follow for seated rest break, decreased foot clearance BLE, decreased heel strike BLE.   Stairs             Wheelchair Mobility     Tilt Bed    Modified Rankin (Stroke Patients Only)       Balance Overall balance assessment: History of Falls, Needs assistance Sitting-balance support: Feet supported, Bilateral upper extremity supported Sitting balance-Leahy Scale: Fair Sitting balance - Comments: poor to fair, pt with L lateral lean with decreased ability to obtain midline posture without assistance; pt engaged in reaching with 1UE with 1UE support with close supervision, pt progresses to close supervision for static sitting Postural control: Posterior lean, Left lateral lean Standing balance support: Bilateral upper extremity supported, During functional activity, Reliant on assistive device for  balance Standing balance-Leahy Scale: Poor Standing balance comment: Pt with significant posterior lean, especially during sit>stand, PT pushes RW slightly anteriorly to help achieve balance; pt  with difficulty transitioning pelvis anteriorly & uprighting trunk for upright posture.                            Cognition Arousal: Alert Behavior During Therapy: WFL for tasks assessed/performed Overall Cognitive Status: History of cognitive impairments - at baseline                                 General Comments: follows simple 1 step commands with extra time        Exercises General Exercises - Lower Extremity Long Arc Quad: AROM, Seated, Strengthening, Both, 10 reps (decreased ROM RLE compared to LLE)    General Comments        Pertinent Vitals/Pain Pain Assessment Pain Assessment: Faces Faces Pain Scale: Hurts little more Pain Location: back, R shoulder Pain Descriptors / Indicators: Aching, Discomfort, Sore, Grimacing, Guarding Pain Intervention(s): Monitored during session    Home Living                          Prior Function            PT Goals (current goals can now be found in the care plan section) Acute Rehab PT Goals Patient Stated Goal: to not be as weak. PT Goal Formulation: With patient Time For Goal Achievement: 12/06/22 Potential to Achieve Goals: Fair Progress towards PT goals: Progressing toward goals    Frequency    Min 1X/week      PT Plan      Co-evaluation              AM-PAC PT "6 Clicks" Mobility   Outcome Measure  Help needed turning from your back to your side while in a flat bed without using bedrails?: A Lot Help needed moving from lying on your back to sitting on the side of a flat bed without using bedrails?: Total Help needed moving to and from a bed to a chair (including a wheelchair)?: A Lot Help needed standing up from a chair using your arms (e.g., wheelchair or bedside chair)?: A Lot Help needed to walk in hospital room?: A Lot Help needed climbing 3-5 steps with a railing? : Total 6 Click Score: 10    End of Session Equipment Utilized During Treatment: Gait  belt Activity Tolerance: Patient tolerated treatment well Patient left: in chair;with chair alarm set;with call bell/phone within reach Nurse Communication: Mobility status PT Visit Diagnosis: Unsteadiness on feet (R26.81);Other abnormalities of gait and mobility (R26.89);Repeated falls (R29.6);Muscle weakness (generalized) (M62.81);History of falling (Z91.81);Difficulty in walking, not elsewhere classified (R26.2)     Time: 1610-9604 PT Time Calculation (min) (ACUTE ONLY): 26 min  Charges:    $Therapeutic Activity: 23-37 mins PT General Charges $$ ACUTE PT VISIT: 1 Visit                     Robin Espinoza, PT, DPT 11/29/22, 10:08 AM  Sandi Mariscal 11/29/2022, 10:06 AM

## 2022-11-29 NOTE — TOC Progression Note (Addendum)
Transition of Care [facility]) - Progression Note    Patient Details  Name: Robin Espinoza MRN: [id number] Date of Birth: 1945-10-29  Transition of Care [facility]) CM/SW Contact  Liliana Cline, LCSW Phone Number: 11/29/2022, 8:41 AM  Clinical Narrative:    CSW reached out to Tiffany at [facility] again requesting confirmation that they can take patient Monday, waiting on a response. HTA auth pending.   1:48- Call from [place] with HTA. EMS approved [phone]. SNF peer to peer offered - due by 12 noon tomorrow 9/9. MD to call Dr. Logan Bores at [phone]. MD updated.         Expected Discharge Plan and Services                                               Social Determinants of Health (SDOH) Interventions SDOH Screenings   Food Insecurity: No Food Insecurity (11/21/2022)  Housing: Low Risk  (11/21/2022)  Transportation Needs: No Transportation Needs (11/21/2022)  Utilities: Not At Risk (11/21/2022)  Alcohol Screen: Low Risk  (09/12/2021)  Depression (PHQ2-9): Medium Risk (09/12/2021)  Financial Resource Strain: Low Risk  (08/31/2022)   Received from [facility], [facility]  Physical Activity: Inactive (04/04/2020)  Social Connections: Socially Integrated (04/04/2020)  Stress: No Stress Concern Present (04/04/2020)  Tobacco Use: Medium Risk (11/20/2022)    Readmission Risk Interventions     No data to display

## 2022-11-29 NOTE — Plan of Care (Signed)

## 2022-11-29 NOTE — Progress Notes (Signed)
  Progress Note   Patient: Robin Espinoza HKV:[id number] DOB: 08/13/1945 DOA: 11/20/2022     9 DOS: the patient was seen and examined on 11/29/2022   Brief hospital course: Robin Espinoza is a 77 y.o. female with medical history significant for CAD, hypertension, hypothyroidism, falls, GERD, obesity, obstructive sleep apnea, depression, anxiety presenting with a fall at home unresponsive.  Positive head trauma, has been feeling dizzy on and off.  Has been notified that she has been intermittently confused and weak past 4 days.  Treated for UTI recently by PCP.  Lab work showed potassium 2.9.  CT imaging of head and neck within normal limits.  Patient is admitted to the hospitalist service for further management evaluation of falls, generalized weakness.  Assessment and Plan: Frequent falls Generalized weakness Recent discharge from subacute rehab. Continue to monitor fall precautions PT recommend return to skilled nursing facility at discharge. TOC working on SNF placement, peer to peer done. Dr. Logan Bores wished to talk to case manager regarding clarifications.  Left shoulder pain- Due to arthritis, continue pain control. Advised to avoid opiates, continue tylenol PRN. Celebrex 100mg  bid dose helps.   History of A-fib Continue amiodarone and metoprolol to 75 mg daily Continue Eliquis therapy.   History of depression Dementia Denies having any suicidal ideation, more confused at night Appreciate psychiatry input, Wellbutrin was discontinued, Abilify 2 mg daily Continue 60mg  home dose.   Hypothyroidism Continue Synthroid decreased dose of 50 mcg due to increased free T4 levels.   Obesity (BMI 38.12 kg/m2) Diet, weight reduction, life style modification and exercise has been discussed.   Hypokalemia Resolved s/p repletion.  Magnesium levels wnl.   History of UTI No pyuria and asymptomatic Discontinued Rocephin.       Subjective: Patient is seen and examined today morning.  She is  sitting in chair, feels better. Some forgetfulness noted, smiles and wishes to go to SNF. RN at bedside helping her.  Physical Exam: Vitals:   11/28/22 2044 11/29/22 0214 11/29/22 0441 11/29/22 0820  BP: 133/88  (!) 114/94 (!) 134/109  Pulse: 73  94 89  Resp: 16  16 16   Temp: 97.7 F (36.5 C)  (!) 97.5 F (36.4 C) (!) 97.4 F (36.3 C)  TempSrc: Oral  Oral   SpO2: 97%  96% 96%  Weight:  100.7 kg    Height:       General - Elderly morbidly obese female, lying, no distress noted HEENT - PERRLA, EOMI, atraumatic head, non tender sinuses. Lung - Clear, rales, rhonchi, wheezes. Heart - S1, S2 heard, no murmurs, rubs, trace pedal edema. Abdomen - soft, non tender, obese. Neuro - Alert, awake and oriented x 3, non focal exam. Skin - Warm and dry. Data Reviewed:  There are no new results to review at this time.  Family Communication: discussed with husband at bedside, understands current care plan.  Disposition: Status is: Inpatient Remains inpatient appropriate because: safe discharge plan  Planned Discharge Destination: Skilled nursing facility, peer to peer done.    Time spent: 38 minutes  Author: Marcelino Duster, MD 11/29/2022 2:06 PM  For on call review [email or web address].

## 2022-11-29 NOTE — TOC Progression Note (Signed)
Transition of Care [facility]) - Progression Note    Patient Details  Name: Robin Espinoza MRN: [id number] Date of Birth: 04-24-1945  Transition of Care [facility]) CM/SW Contact  Liliana Cline, LCSW Phone Number: 11/29/2022, 2:30 PM  Clinical Narrative:    Dr. Logan Bores (Medical Director with [facility]) requested a call from CSW. CSW spoke to Dr. Logan Bores who inquired what the long term plan is for patient, he inquired if plan is for LTC placement. He requested CSW to call patient's daughter and follow up.  Spoke to patient first. Patient states her hope would be to return home with husband after she completes STR. She states they are trying to set up private pay aide services at home. She states it is ok for CSW to call her daughter [name].  Called [facility]. [name] stated they are seeking LTC placement for patient. She states patient's spouse has an appointment at DSS tomorrow to start the [facility] application. She states the appointment was last week but the DSS Worker rescheduled for 9/9. Encouraged [name] and patient's spouse to talk to patient about this plan. [name] states they plan to talk to patient about this once they know if LTC placement is an option financially. She states if they cannot get Medicaid, they would plan for patient to return home after getting more STR and set up private pay aide hours, but that they cannot afford the amount of hours they really feel that patient needs.  Updated Dr. Logan Bores.        Expected Discharge Plan and Services                                               Social Determinants of Health (SDOH) Interventions SDOH Screenings   Food Insecurity: No Food Insecurity (11/21/2022)  Housing: Low Risk  (11/21/2022)  Transportation Needs: No Transportation Needs (11/21/2022)  Utilities: Not At Risk (11/21/2022)  Alcohol Screen: Low Risk  (09/12/2021)  Depression (PHQ2-9): Medium Risk (09/12/2021)  Financial Resource Strain: Low  Risk  (08/31/2022)   Received from [facility], [facility]  Physical Activity: Inactive (04/04/2020)  Social Connections: Socially Integrated (04/04/2020)  Stress: No Stress Concern Present (04/04/2020)  Tobacco Use: Medium Risk (11/20/2022)    Readmission Risk Interventions     No data to display

## 2022-11-30 DIAGNOSIS — R059 Cough, unspecified: Secondary | ICD-10-CM | POA: Diagnosis not present

## 2022-11-30 DIAGNOSIS — E538 Deficiency of other specified B group vitamins: Secondary | ICD-10-CM | POA: Diagnosis not present

## 2022-11-30 DIAGNOSIS — M25512 Pain in left shoulder: Secondary | ICD-10-CM | POA: Diagnosis not present

## 2022-11-30 DIAGNOSIS — I484 Atypical atrial flutter: Secondary | ICD-10-CM | POA: Diagnosis not present

## 2022-11-30 DIAGNOSIS — E782 Mixed hyperlipidemia: Secondary | ICD-10-CM | POA: Diagnosis not present

## 2022-11-30 DIAGNOSIS — R29898 Other symptoms and signs involving the musculoskeletal system: Secondary | ICD-10-CM | POA: Diagnosis not present

## 2022-11-30 DIAGNOSIS — Z743 Need for continuous supervision: Secondary | ICD-10-CM | POA: Diagnosis not present

## 2022-11-30 DIAGNOSIS — I10 Essential (primary) hypertension: Secondary | ICD-10-CM | POA: Diagnosis not present

## 2022-11-30 DIAGNOSIS — R051 Acute cough: Secondary | ICD-10-CM | POA: Diagnosis not present

## 2022-11-30 DIAGNOSIS — W19XXXA Unspecified fall, initial encounter: Secondary | ICD-10-CM | POA: Diagnosis not present

## 2022-11-30 DIAGNOSIS — R531 Weakness: Secondary | ICD-10-CM | POA: Diagnosis not present

## 2022-11-30 DIAGNOSIS — E559 Vitamin D deficiency, unspecified: Secondary | ICD-10-CM | POA: Diagnosis not present

## 2022-11-30 DIAGNOSIS — Z7901 Long term (current) use of anticoagulants: Secondary | ICD-10-CM | POA: Diagnosis not present

## 2022-11-30 DIAGNOSIS — K219 Gastro-esophageal reflux disease without esophagitis: Secondary | ICD-10-CM | POA: Diagnosis not present

## 2022-11-30 DIAGNOSIS — K5900 Constipation, unspecified: Secondary | ICD-10-CM | POA: Diagnosis not present

## 2022-11-30 DIAGNOSIS — F32A Depression, unspecified: Secondary | ICD-10-CM | POA: Diagnosis not present

## 2022-11-30 DIAGNOSIS — H04123 Dry eye syndrome of bilateral lacrimal glands: Secondary | ICD-10-CM | POA: Diagnosis not present

## 2022-11-30 DIAGNOSIS — N3281 Overactive bladder: Secondary | ICD-10-CM | POA: Diagnosis not present

## 2022-11-30 DIAGNOSIS — F0393 Unspecified dementia, unspecified severity, with mood disturbance: Secondary | ICD-10-CM | POA: Diagnosis not present

## 2022-11-30 DIAGNOSIS — E038 Other specified hypothyroidism: Secondary | ICD-10-CM | POA: Diagnosis not present

## 2022-11-30 DIAGNOSIS — I4892 Unspecified atrial flutter: Secondary | ICD-10-CM | POA: Diagnosis not present

## 2022-11-30 DIAGNOSIS — E039 Hypothyroidism, unspecified: Secondary | ICD-10-CM | POA: Diagnosis not present

## 2022-11-30 DIAGNOSIS — I480 Paroxysmal atrial fibrillation: Secondary | ICD-10-CM | POA: Diagnosis not present

## 2022-11-30 DIAGNOSIS — I959 Hypotension, unspecified: Secondary | ICD-10-CM | POA: Diagnosis not present

## 2022-11-30 DIAGNOSIS — R443 Hallucinations, unspecified: Secondary | ICD-10-CM | POA: Diagnosis not present

## 2022-11-30 DIAGNOSIS — K5909 Other constipation: Secondary | ICD-10-CM | POA: Diagnosis not present

## 2022-11-30 DIAGNOSIS — E876 Hypokalemia: Secondary | ICD-10-CM | POA: Diagnosis not present

## 2022-11-30 DIAGNOSIS — G4733 Obstructive sleep apnea (adult) (pediatric): Secondary | ICD-10-CM | POA: Diagnosis not present

## 2022-11-30 DIAGNOSIS — F419 Anxiety disorder, unspecified: Secondary | ICD-10-CM | POA: Diagnosis not present

## 2022-11-30 DIAGNOSIS — I2510 Atherosclerotic heart disease of native coronary artery without angina pectoris: Secondary | ICD-10-CM | POA: Diagnosis not present

## 2022-11-30 DIAGNOSIS — Z9181 History of falling: Secondary | ICD-10-CM | POA: Diagnosis not present

## 2022-11-30 DIAGNOSIS — F331 Major depressive disorder, recurrent, moderate: Secondary | ICD-10-CM | POA: Diagnosis not present

## 2022-11-30 DIAGNOSIS — Z96651 Presence of right artificial knee joint: Secondary | ICD-10-CM | POA: Diagnosis not present

## 2022-11-30 DIAGNOSIS — N390 Urinary tract infection, site not specified: Secondary | ICD-10-CM | POA: Diagnosis not present

## 2022-11-30 DIAGNOSIS — M6281 Muscle weakness (generalized): Secondary | ICD-10-CM | POA: Diagnosis not present

## 2022-11-30 DIAGNOSIS — R5381 Other malaise: Secondary | ICD-10-CM | POA: Diagnosis not present

## 2022-11-30 DIAGNOSIS — E878 Other disorders of electrolyte and fluid balance, not elsewhere classified: Secondary | ICD-10-CM | POA: Diagnosis not present

## 2022-11-30 LAB — CBC
HCT: 40.2 % (ref 36.0–46.0)
Hemoglobin: 13.2 g/dL (ref 12.0–15.0)
MCH: 33.2 pg (ref 26.0–34.0)
MCHC: 32.8 g/dL (ref 30.0–36.0)
MCV: 101 fL — ABNORMAL HIGH (ref 80.0–100.0)
Platelets: 292 10*3/uL (ref 150–400)
RBC: 3.98 MIL/uL (ref 3.87–5.11)
RDW: 13.5 % (ref 11.5–15.5)
WBC: 6.6 10*3/uL (ref 4.0–10.5)
nRBC: 0 % (ref 0.0–0.2)

## 2022-11-30 MED ORDER — DOCUSATE SODIUM 100 MG PO CAPS: 100.0000 mg | ORAL_CAPSULE | Freq: Two times a day (BID) | ORAL | 0 refills | Status: DC

## 2022-11-30 MED ORDER — LEVOTHYROXINE SODIUM 50 MCG PO TABS: 50.0000 ug | ORAL_TABLET | Freq: Every day | ORAL | 3 refills | Status: AC

## 2022-11-30 MED ORDER — ARIPIPRAZOLE 2 MG PO TABS: 2.0000 mg | ORAL_TABLET | Freq: Every day | ORAL | 3 refills | Status: AC

## 2022-11-30 NOTE — Consult Note (Signed)
[facility] Customer service manager [facility]) Accountable Care Organization (ACO) [facility] Liaison Note  11/30/2022  [name] 1946-01-28 [id number]  Location: [facility] RN Hospital Liaison screened the patient remotely at [facility].  Insurance: [employer]   Robin Espinoza is a 77 y.o. female who is a Primary Care Patient of Lauro Regulus, MD [facility]). The patient was screened for readmission hospitalization with noted low risk score for unplanned readmission risk with 2 IP in 6 months.  The patient was assessed for potential [facility]) Care Management service needs for post hospital transition for care coordination. Review of patient's electronic medical record reveals patient was admitted for Bilateral leg weakness. Pt will discharged to SNF level of care for STR. Facility will continue to manage pt's needs.  [facility] Care Management/Population Health does not replace or interfere with any arrangements made by the Inpatient Transition of Care team.   For questions contact:   Elliot Cousin, RN, [facility] Liaison [place]   Population Health Office Hours MTWF  8:00 am-6:00 pm [phone] mobile [phone] [Office toll free line] Office Hours are M-F 8:30 - 5 pm [email or web address]

## 2022-11-30 NOTE — Care Management Important Message (Signed)
Important Message  Patient Details  Name: Robin Espinoza MRN: [id number] Date of Birth: 17-Sep-1945   Medicare Important Message Given:  Yes     [name] A [name] 11/30/2022, 11:24 AM

## 2022-11-30 NOTE — Discharge Summary (Signed)
Physician Discharge Summary   Patient: Robin Espinoza MRN: [id number] DOB: 09-30-1945  Admit date:     11/20/2022  Discharge date: 11/30/22  Discharge Physician: Marcelino Duster   PCP: Lauro Regulus, MD   Recommendations at discharge:    PCP follow up in 1 week.  Discharge Diagnoses: Principal Problem:   Bilateral leg weakness Active Problems:   Hypotension   Falls   Hypokalemia   Hypothyroidism   Anxiety disorder   Obstructive apnea   Essential hypertension   Atrial flutter (HCC)   Major depressive disorder, recurrent episode, moderate (HCC)  Resolved Problems:   * No resolved hospital problems. *  Hospital Course: Robin Espinoza is a 77 y.o. female with medical history significant for CAD, hypertension, hypothyroidism, falls, GERD, obesity, obstructive sleep apnea, depression, anxiety presenting with a fall at home unresponsive.  Positive head trauma, has been feeling dizzy on and off.  Has been notified that she has been intermittently confused and weak past 4 days.  Treated for UTI recently by PCP.  Lab work showed potassium 2.9.  She was recently discharged from rehab facility.  In the emergency department CT imaging of head and neck within normal limits.  Patient is admitted to the hospitalist service for further management evaluation of falls, generalized weakness.  During the hospital stay, urine cultures unremarkable, IV antibiotics stopped.  Patient is seen by psychiatry team advised to stop Wellbutrin, started on Abilify 2 mg daily and advised to increased Cymbalta to 90 mg.  Patient's husband does not want to increase Cymbalta as it is the culprit of altered mental status, hallucinations.  I continued home dose Cymbalta 60 mg.  Patient's Synthroid dose decreased to 50 mcg as T4 levels were high.  She is continued on amiodarone, metoprolol and Eliquis therapy.  Patient's potassium closely monitored, repleted.  Patient is seen by PT OT who advised rehab facility  placement.  Patient is hemodynamically stable to be discharged to [facility] today.   Assessment and Plan: Frequent falls Generalized weakness Recent discharge from subacute rehab. Continue to monitor fall precautions PT recommend return to skilled nursing facility for rehab.   Left shoulder pain- Due to arthritis, continue pain control. Advised to avoid opiates, continue tylenol PRN. Celebrex 100mg  bid dose helps.   History of A-fib Continue amiodarone and metoprolol to 75 mg daily Continue Eliquis therapy.   History of depression Dementia Denies having any suicidal ideation, more confused at night Appreciate psychiatry input, Wellbutrin was discontinued, Abilify 2 mg daily Continue 60mg  home dose.   Hypothyroidism Continue Synthroid decreased dose of 50 mcg due to increased free T4 levels. Follow up PCP for TSH, T4 level check.   Obesity (BMI 38.12 kg/m2) Diet, weight reduction, life style modification and exercise has been discussed.   Hypokalemia Resolved s/p repletion.  Magnesium levels wnl.   History of UTI No pyuria and asymptomatic Discontinued Rocephin.   Debility/ Deconditioning: PT/ OT follow up at Rehab. Encourage out of bed to chair.       Consultants: none Procedures performed: none  Disposition: Rehabilitation facility Diet recommendation:  Discharge Diet Orders (From admission, onward)     Start     Ordered   11/30/22 0000  Diet - low sodium heart healthy        11/30/22 1122           Cardiac diet DISCHARGE MEDICATION: Allergies as of 11/30/2022       Reactions   Oxycodone Other (See Comments)  hallucinated   Amlodipine Swelling   Patient has been tolerating low dose        Medication List     STOP taking these medications    buPROPion 300 MG 24 hr tablet Commonly known as: WELLBUTRIN XL   nitrofurantoin (macrocrystal-monohydrate) 100 MG capsule Commonly known as: MACROBID       TAKE these medications     acetaminophen 650 MG CR tablet Commonly known as: TYLENOL Take 1,300 mg by mouth every 8 (eight) hours as needed for pain.   amiodarone 200 MG tablet Commonly known as: PACERONE Take 1 tablet (200 mg total) by mouth daily.   apixaban 5 MG Tabs tablet Commonly known as: ELIQUIS Take 1 tablet (5 mg total) by mouth 2 (two) times daily.   ARIPiprazole 2 MG tablet Commonly known as: ABILIFY Take 1 tablet (2 mg total) by mouth daily after breakfast. Start taking on: December 01, 2022   celecoxib 200 MG capsule Commonly known as: CELEBREX Take 200 mg by mouth 2 (two) times daily.   cyanocobalamin 1000 MCG tablet Take 1 tablet (1,000 mcg total) by mouth daily.   docusate sodium 100 MG capsule Commonly known as: COLACE Take 1 capsule (100 mg total) by mouth 2 (two) times daily.   DULoxetine 60 MG capsule Commonly known as: CYMBALTA Take 1 tablet by mouth daily.   furosemide 40 MG tablet Commonly known as: LASIX Take 1 tablet (40 mg total) by mouth daily.   gabapentin 100 MG capsule Commonly known as: NEURONTIN Take 2 capsules (200 mg total) by mouth 2 (two) times daily.   levothyroxine 50 MCG tablet Commonly known as: SYNTHROID Take 1 tablet (50 mcg total) by mouth daily at 6 (six) AM. Start taking on: December 01, 2022 What changed:  medication strength how much to take when to take this   lovastatin 40 MG tablet Commonly known as: MEVACOR Take 1 tablet (40 mg total) by mouth at bedtime.   metoprolol succinate 50 MG 24 hr tablet Commonly known as: TOPROL-XL Take 1 tablet (50 mg total) by mouth daily. Take with or immediately following a meal.   NON FORMULARY CPAP   nystatin cream Commonly known as: MYCOSTATIN Apply 1 Application topically 2 (two) times daily as needed.   omeprazole 20 MG capsule Commonly known as: PRILOSEC TAKE 1 CAPSULE BY MOUTH EVERY DAY   polyethylene glycol 17 g packet Commonly known as: MIRALAX / GLYCOLAX Take 17 g by mouth  daily as needed.   senna 8.6 MG tablet Commonly known as: SENOKOT Take 2 tablets (17.2 mg total) by mouth at bedtime.   THERATEARS OP Place 1 drop into both eyes daily as needed (dry eyes).   traZODone 100 MG tablet Commonly known as: DESYREL Take 1 tablet (100 mg total) by mouth daily.        Discharge Exam: Filed Weights   11/28/22 0122 11/29/22 0214 11/30/22 0215  Weight: 101.2 kg 100.7 kg 101.9 kg   General - Elderly morbidly obese female, sitting in chair, no distress noted HEENT - PERRLA, EOMI, atraumatic head, non tender sinuses. Lung - Clear, rales, rhonchi, wheezes. Heart - S1, S2 heard, no murmurs, rubs, trace pedal edema. Abdomen - soft, non tender, obese. Neuro - Alert, awake and oriented x 3, non focal exam. Skin - Warm and dry.  Condition at discharge: stable  The results of significant diagnostics from this hospitalization (including imaging, microbiology, ancillary and laboratory) are listed below for reference.   Imaging Studies: CT HEAD  WO CONTRAST ( )  Result Date: 11/24/2022 CLINICAL DATA:  Initial evaluation for delirium. EXAM: CT HEAD WITHOUT CONTRAST TECHNIQUE: Contiguous axial images were obtained from the base of the skull through the vertex without intravenous contrast. RADIATION DOSE REDUCTION: This exam was performed according to the departmental dose-optimization program which includes automated exposure control, adjustment of the mA and/or kV according to patient size and/or use of iterative reconstruction technique. COMPARISON:  Prior study from 11/20/2022. FINDINGS: Brain: Cerebral volume within normal limits. Mild chronic microvascular ischemic disease. No acute intracranial hemorrhage. No acute large vessel territory infarct. No mass lesion or midline shift. No hydrocephalus or extra-axial fluid collection. Vascular: No abnormal hyperdense vessel. Scattered vascular calcifications noted within the carotid siphons. Skull: Scalp soft tissues and  calvarium demonstrate no acute finding. Sinuses/Orbits: Globes orbital soft tissues within normal limits. Paranasal sinuses and mastoid air cells are clear. Other: None. IMPRESSION: 1. No acute intracranial abnormality. 2. Mild chronic microvascular ischemic disease. Electronically Signed   By: Rise Mu M.D.   On: 11/24/2022 18:34   MR BRAIN WO CONTRAST  Result Date: 11/20/2022 CLINICAL DATA:  Fall EXAM: MRI HEAD WITHOUT CONTRAST TECHNIQUE: Multiplanar, multiecho pulse sequences of the brain and surrounding structures were obtained without intravenous contrast. COMPARISON:  None Available. FINDINGS: Brain: No acute infarct, mass effect or extra-axial collection. No acute or chronic hemorrhage. There is multifocal hyperintense T2-weighted signal within the white matter. Parenchymal volume and CSF spaces are normal. The midline structures are normal. Vascular: Major flow voids are preserved. Skull and upper cervical spine: Normal calvarium and skull base. Visualized upper cervical spine and soft tissues are normal. Sinuses/Orbits:No paranasal sinus fluid levels or advanced mucosal thickening. No mastoid or middle ear effusion. Normal orbits. IMPRESSION: 1. No acute intracranial abnormality. 2. Findings of chronic Gelb vessel ischemia. Electronically Signed   By: Deatra Robinson M.D.   On: 11/20/2022 20:40   CT Cervical Spine Wo Contrast  Result Date: 11/20/2022 CLINICAL DATA:  Larey Seat yesterday, hit head EXAM: CT CERVICAL SPINE WITHOUT CONTRAST TECHNIQUE: Multidetector CT imaging of the cervical spine was performed without intravenous contrast. Multiplanar CT image reconstructions were also generated. RADIATION DOSE REDUCTION: This exam was performed according to the departmental dose-optimization program which includes automated exposure control, adjustment of the mA and/or kV according to patient size and/or use of iterative reconstruction technique. COMPARISON:  09/16/2022 FINDINGS: Alignment:  Alignment is anatomic. Skull base and vertebrae: No acute fracture. No primary bone lesion or focal pathologic process. Soft tissues and spinal canal: No prevertebral fluid or swelling. No visible canal hematoma. Disc levels: Stable multilevel spondylosis and facet hypertrophy, most pronounced from C3-4 through C5-6. Stable neural foraminal encroachment at the C3-4 and C4-5 levels. Upper chest: Airway is patent.  Lung apices are clear. Other: Reconstructed images demonstrate no additional findings. IMPRESSION: 1. Stable cervical degenerative changes.  No acute fracture. Electronically Signed   By: Sharlet Salina M.D.   On: 11/20/2022 15:56   CT Head Wo Contrast  Result Date: 11/20/2022 CLINICAL DATA:  Larey Seat yesterday, hit head EXAM: CT HEAD WITHOUT CONTRAST TECHNIQUE: Contiguous axial images were obtained from the base of the skull through the vertex without intravenous contrast. RADIATION DOSE REDUCTION: This exam was performed according to the departmental dose-optimization program which includes automated exposure control, adjustment of the mA and/or kV according to patient size and/or use of iterative reconstruction technique. COMPARISON:  09/16/2022 FINDINGS: Brain: No acute infarct or hemorrhage. Lateral ventricles and midline structures are unremarkable. No acute extra-axial fluid  collections. No mass effect. Vascular: No hyperdense vessel or unexpected calcification. Skull: Normal. Negative for fracture or focal lesion. Sinuses/Orbits: No acute finding. Other: None. IMPRESSION: 1. No acute intracranial process. Electronically Signed   By: Sharlet Salina M.D.   On: 11/20/2022 15:54    Microbiology: Results for orders placed or performed during the hospital encounter of 11/20/22  Blood culture (routine x 2)     Status: None   Collection Time: 11/21/22  5:58 AM   Specimen: BLOOD  Result Value Ref Range Status   Specimen Description BLOOD BLOOD RIGHT ARM  Final   Special Requests   Final    BOTTLES  DRAWN AEROBIC AND ANAEROBIC Blood Culture adequate volume   Culture   Final    NO GROWTH 5 DAYS Performed at [facility], [address] [place], [place] [address]    Report Status 11/26/2022 FINAL  Final  Blood culture (routine x 2)     Status: None   Collection Time: 11/21/22  6:15 AM   Specimen: BLOOD  Result Value Ref Range Status   Specimen Description BLOOD BLOOD LEFT HAND  Final   Special Requests   Final    BOTTLES DRAWN AEROBIC ONLY Blood Culture results may not be optimal due to an inadequate volume of blood received in culture bottles   Culture   Final    NO GROWTH 5 DAYS Performed at [facility], [address] [place], [place] [address]    Report Status 11/26/2022 FINAL  Final    Labs: CBC: Recent Labs  Lab 11/30/22 0437  WBC 6.6  HGB 13.2  HCT 40.2  MCV 101.0*  PLT 292   Basic Metabolic Panel: Recent Labs  Lab 11/24/22 0550 11/25/22 0556 11/26/22 0434  NA 135 138 137  K 4.5 4.3 4.4  CL 103 100 100  CO2 26 27 29   GLUCOSE 102* 92 87  BUN 19 19 20   CREATININE 0.79 0.89 0.97  CALCIUM 8.3* 8.6* 8.4*  MG 1.8 2.6*  --   PHOS  --  3.8  --    Liver Function Tests: No results for input(s): "AST", "ALT", "ALKPHOS", "BILITOT", "PROT", "ALBUMIN" in the last 168 hours. CBG: No results for input(s): "GLUCAP" in the last 168 hours.  Discharge time spent: 39 minutes.  Signed: Marcelino Duster, MD [facility] 11/30/2022

## 2022-11-30 NOTE — Plan of Care (Signed)

## 2022-11-30 NOTE — Group Note (Deleted)

## 2022-11-30 NOTE — TOC Transition Note (Addendum)
Transition of Care [facility]) - CM/SW Discharge Note   Patient Details  Name: Robin Espinoza MRN: [id number] Date of Birth: 12-Feb-1946  Transition of Care [facility]) CM/SW Contact:  Allena Katz, LCSW Phone Number: 11/30/2022, 10:35 AM   Clinical Narrative:   Pt has orders to discharge to [facility]. RN given number for report. DC summary to be sent once in. Medical necessity printed to unit. DNR signed and on chart.    Final next level of care: Skilled Nursing Facility Barriers to Discharge: Barriers Resolved   Patient Goals and CMS Choice CMS Medicare.gov Compare Post Acute Care list provided to:: Patient Choice offered to / list presented to : Patient  Discharge Placement PASRR number recieved: 11/30/22 PASRR number recieved: 11/30/22            Patient [facility] at[facility] Patient to be transferred to facility by: [facility]   Patient and family notified of of transfer: 11/30/22  Discharge Plan and Services Additional resources added to the After Visit Summary for                                       Social Determinants of Health (SDOH) Interventions SDOH Screenings   Food Insecurity: No Food Insecurity (11/21/2022)  Housing: Low Risk  (11/21/2022)  Transportation Needs: No Transportation Needs (11/21/2022)  Utilities: Not At Risk (11/21/2022)  Alcohol Screen: Low Risk  (09/12/2021)  Depression (PHQ2-9): Medium Risk (09/12/2021)  Financial Resource Strain: Low Risk  (08/31/2022)   Received from [facility], [facility]  Physical Activity: Inactive (04/04/2020)  Social Connections: Socially Integrated (04/04/2020)  Stress: No Stress Concern Present (04/04/2020)  Tobacco Use: Medium Risk (11/20/2022)     Readmission Risk Interventions     No data to display

## 2022-11-30 NOTE — TOC Progression Note (Signed)
Transition of Care [facility]) - Progression Note    Patient Details  Name: Robin Espinoza MRN: [id number] Date of Birth: January 19, 1946  Transition of Care [facility]) CM/SW Contact  Allena Katz, LCSW Phone Number: 11/30/2022, 10:19 AM  Clinical Narrative:    [facility] approved for LC.  Auth ID [id number]. EMS approval [id number]        Expected Discharge Plan and Services                                               Social Determinants of Health (SDOH) Interventions SDOH Screenings   Food Insecurity: No Food Insecurity (11/21/2022)  Housing: Low Risk  (11/21/2022)  Transportation Needs: No Transportation Needs (11/21/2022)  Utilities: Not At Risk (11/21/2022)  Alcohol Screen: Low Risk  (09/12/2021)  Depression (PHQ2-9): Medium Risk (09/12/2021)  Financial Resource Strain: Low Risk  (08/31/2022)   Received from [facility], [facility]  Physical Activity: Inactive (04/04/2020)  Social Connections: Socially Integrated (04/04/2020)  Stress: No Stress Concern Present (04/04/2020)  Tobacco Use: Medium Risk (11/20/2022)    Readmission Risk Interventions     No data to display

## 2022-12-02 DIAGNOSIS — M6281 Muscle weakness (generalized): Secondary | ICD-10-CM | POA: Diagnosis not present

## 2022-12-02 DIAGNOSIS — E038 Other specified hypothyroidism: Secondary | ICD-10-CM | POA: Diagnosis not present

## 2022-12-02 DIAGNOSIS — K5909 Other constipation: Secondary | ICD-10-CM | POA: Diagnosis not present

## 2022-12-02 DIAGNOSIS — R5381 Other malaise: Secondary | ICD-10-CM | POA: Diagnosis not present

## 2022-12-02 DIAGNOSIS — K219 Gastro-esophageal reflux disease without esophagitis: Secondary | ICD-10-CM | POA: Diagnosis not present

## 2022-12-02 DIAGNOSIS — M25512 Pain in left shoulder: Secondary | ICD-10-CM | POA: Diagnosis not present

## 2022-12-02 DIAGNOSIS — Z7901 Long term (current) use of anticoagulants: Secondary | ICD-10-CM | POA: Diagnosis not present

## 2022-12-02 DIAGNOSIS — F32A Depression, unspecified: Secondary | ICD-10-CM | POA: Diagnosis not present

## 2022-12-02 DIAGNOSIS — I480 Paroxysmal atrial fibrillation: Secondary | ICD-10-CM | POA: Diagnosis not present

## 2022-12-02 DIAGNOSIS — Z9181 History of falling: Secondary | ICD-10-CM | POA: Diagnosis not present

## 2022-12-02 DIAGNOSIS — N390 Urinary tract infection, site not specified: Secondary | ICD-10-CM | POA: Diagnosis not present

## 2022-12-02 DIAGNOSIS — E876 Hypokalemia: Secondary | ICD-10-CM | POA: Diagnosis not present

## 2022-12-04 DIAGNOSIS — Z9181 History of falling: Secondary | ICD-10-CM | POA: Diagnosis not present

## 2022-12-04 DIAGNOSIS — M6281 Muscle weakness (generalized): Secondary | ICD-10-CM | POA: Diagnosis not present

## 2022-12-04 DIAGNOSIS — E038 Other specified hypothyroidism: Secondary | ICD-10-CM | POA: Diagnosis not present

## 2022-12-04 DIAGNOSIS — Z7901 Long term (current) use of anticoagulants: Secondary | ICD-10-CM | POA: Diagnosis not present

## 2022-12-04 DIAGNOSIS — I480 Paroxysmal atrial fibrillation: Secondary | ICD-10-CM | POA: Diagnosis not present

## 2022-12-04 DIAGNOSIS — R5381 Other malaise: Secondary | ICD-10-CM | POA: Diagnosis not present

## 2022-12-04 DIAGNOSIS — K219 Gastro-esophageal reflux disease without esophagitis: Secondary | ICD-10-CM | POA: Diagnosis not present

## 2022-12-04 DIAGNOSIS — K5909 Other constipation: Secondary | ICD-10-CM | POA: Diagnosis not present

## 2022-12-04 DIAGNOSIS — F0393 Unspecified dementia, unspecified severity, with mood disturbance: Secondary | ICD-10-CM | POA: Diagnosis not present

## 2022-12-07 ENCOUNTER — Ambulatory Visit: Payer: PPO | Admitting: Endocrinology

## 2022-12-07 DIAGNOSIS — K5909 Other constipation: Secondary | ICD-10-CM | POA: Diagnosis not present

## 2022-12-07 DIAGNOSIS — F32A Depression, unspecified: Secondary | ICD-10-CM | POA: Diagnosis not present

## 2022-12-07 DIAGNOSIS — K219 Gastro-esophageal reflux disease without esophagitis: Secondary | ICD-10-CM | POA: Diagnosis not present

## 2022-12-07 DIAGNOSIS — E038 Other specified hypothyroidism: Secondary | ICD-10-CM | POA: Diagnosis not present

## 2022-12-07 DIAGNOSIS — F0393 Unspecified dementia, unspecified severity, with mood disturbance: Secondary | ICD-10-CM | POA: Diagnosis not present

## 2022-12-07 DIAGNOSIS — R443 Hallucinations, unspecified: Secondary | ICD-10-CM | POA: Diagnosis not present

## 2022-12-07 DIAGNOSIS — Z9181 History of falling: Secondary | ICD-10-CM | POA: Diagnosis not present

## 2022-12-07 DIAGNOSIS — R5381 Other malaise: Secondary | ICD-10-CM | POA: Diagnosis not present

## 2022-12-07 DIAGNOSIS — I480 Paroxysmal atrial fibrillation: Secondary | ICD-10-CM | POA: Diagnosis not present

## 2022-12-07 DIAGNOSIS — Z7901 Long term (current) use of anticoagulants: Secondary | ICD-10-CM | POA: Diagnosis not present

## 2022-12-07 DIAGNOSIS — M6281 Muscle weakness (generalized): Secondary | ICD-10-CM | POA: Diagnosis not present

## 2022-12-08 DIAGNOSIS — F32A Depression, unspecified: Secondary | ICD-10-CM | POA: Diagnosis not present

## 2022-12-08 DIAGNOSIS — M6281 Muscle weakness (generalized): Secondary | ICD-10-CM | POA: Diagnosis not present

## 2022-12-08 DIAGNOSIS — R051 Acute cough: Secondary | ICD-10-CM | POA: Diagnosis not present

## 2022-12-08 DIAGNOSIS — E038 Other specified hypothyroidism: Secondary | ICD-10-CM | POA: Diagnosis not present

## 2022-12-08 DIAGNOSIS — Z9181 History of falling: Secondary | ICD-10-CM | POA: Diagnosis not present

## 2022-12-08 DIAGNOSIS — K219 Gastro-esophageal reflux disease without esophagitis: Secondary | ICD-10-CM | POA: Diagnosis not present

## 2022-12-08 DIAGNOSIS — N390 Urinary tract infection, site not specified: Secondary | ICD-10-CM | POA: Diagnosis not present

## 2022-12-08 DIAGNOSIS — F0393 Unspecified dementia, unspecified severity, with mood disturbance: Secondary | ICD-10-CM | POA: Diagnosis not present

## 2022-12-08 DIAGNOSIS — I480 Paroxysmal atrial fibrillation: Secondary | ICD-10-CM | POA: Diagnosis not present

## 2022-12-08 DIAGNOSIS — K5909 Other constipation: Secondary | ICD-10-CM | POA: Diagnosis not present

## 2022-12-09 ENCOUNTER — Ambulatory Visit: Payer: PPO | Admitting: Family

## 2022-12-09 DIAGNOSIS — F32A Depression, unspecified: Secondary | ICD-10-CM | POA: Diagnosis not present

## 2022-12-09 DIAGNOSIS — E038 Other specified hypothyroidism: Secondary | ICD-10-CM | POA: Diagnosis not present

## 2022-12-09 DIAGNOSIS — F0393 Unspecified dementia, unspecified severity, with mood disturbance: Secondary | ICD-10-CM | POA: Diagnosis not present

## 2022-12-09 DIAGNOSIS — R5381 Other malaise: Secondary | ICD-10-CM | POA: Diagnosis not present

## 2022-12-09 DIAGNOSIS — K5909 Other constipation: Secondary | ICD-10-CM | POA: Diagnosis not present

## 2022-12-09 DIAGNOSIS — Z9181 History of falling: Secondary | ICD-10-CM | POA: Diagnosis not present

## 2022-12-09 DIAGNOSIS — I480 Paroxysmal atrial fibrillation: Secondary | ICD-10-CM | POA: Diagnosis not present

## 2022-12-09 DIAGNOSIS — M6281 Muscle weakness (generalized): Secondary | ICD-10-CM | POA: Diagnosis not present

## 2022-12-09 DIAGNOSIS — Z7901 Long term (current) use of anticoagulants: Secondary | ICD-10-CM | POA: Diagnosis not present

## 2022-12-10 ENCOUNTER — Ambulatory Visit: Payer: PPO | Admitting: Family

## 2022-12-10 NOTE — Progress Notes (Deleted)
Assessment & Plan:  There are no diagnoses linked to this encounter.   Return precautions given.   Risks, benefits, and alternatives of the medications and treatment plan prescribed today were discussed, and patient expressed understanding.   Education regarding symptom management and diagnosis given to patient on AVS either electronically or printed.  No follow-ups on file.  Rennie Plowman, FNP  Subjective:    Patient ID: Robin Espinoza, female    DOB: 1945/05/31, 77 y.o.   MRN: [id number]  CC: Robin Espinoza is a 77 y.o. female who presents today to establish care.    HPI: HPI She presented to [facility] emergency room presenting with a fall at home unresponsive.    Patient describes feeling dizzy off and on for a while.  Husband at bedside reported patient is been weak and intermittently confused.  Recently treated for UTI by PCP.  Potassium 2.9  Admitted for altered mental status, generalized weakness, atrial fibrillation 11/20/2022 , discharged 11/30/22   11/20/22 CT spine and head without acute intracranial process.  Stable just cervical degenerative changes, no acute fracture MRI of the brain obtained no acute intracranial abnormality, findings of chronic [place] vessel ischemia     History of CAD, hypertension, hypothyroidism, obesity, struct of sleep apnea, depression and anxiety  Allergies: Oxycodone and Amlodipine Current Outpatient Medications on File Prior to Visit  Medication Sig Dispense Refill   acetaminophen (TYLENOL) 650 MG CR tablet Take 1,300 mg by mouth every 8 (eight) hours as needed for pain.     amiodarone (PACERONE) 200 MG tablet Take 1 tablet (200 mg total) by mouth daily. 30 tablet 0   apixaban (ELIQUIS) 5 MG TABS tablet Take 1 tablet (5 mg total) by mouth 2 (two) times daily. 60 tablet 0   ARIPiprazole (ABILIFY) 2 MG tablet Take 1 tablet (2 mg total) by mouth daily after breakfast. 30 tablet 3   Carboxymethylcellulose Sodium (THERATEARS OP) Place 1 drop into  both eyes daily as needed (dry eyes).     celecoxib (CELEBREX) 200 MG capsule Take 200 mg by mouth 2 (two) times daily.     cyanocobalamin 1000 MCG tablet Take 1 tablet (1,000 mcg total) by mouth daily. (Patient not taking: Reported on 11/20/2022) 30 tablet 0   docusate sodium (COLACE) 100 MG capsule Take 1 capsule (100 mg total) by mouth 2 (two) times daily. 10 capsule 0   DULoxetine (CYMBALTA) 60 MG capsule Take 1 tablet by mouth daily.     furosemide (LASIX) 40 MG tablet Take 1 tablet (40 mg total) by mouth daily. 30 tablet 0   gabapentin (NEURONTIN) 100 MG capsule Take 2 capsules (200 mg total) by mouth 2 (two) times daily. 120 capsule 0   levothyroxine (SYNTHROID) 50 MCG tablet Take 1 tablet (50 mcg total) by mouth daily at 6 (six) AM. 30 tablet 3   lovastatin (MEVACOR) 40 MG tablet Take 1 tablet (40 mg total) by mouth at bedtime. 30 tablet 0   metoprolol succinate (TOPROL-XL) 50 MG 24 hr tablet Take 1 tablet (50 mg total) by mouth daily. Take with or immediately following a meal. 60 tablet 0   NON FORMULARY CPAP     nystatin cream (MYCOSTATIN) Apply 1 Application topically 2 (two) times daily as needed.     omeprazole (PRILOSEC) 20 MG capsule TAKE 1 CAPSULE BY MOUTH EVERY DAY 30 capsule 0   polyethylene glycol (MIRALAX / GLYCOLAX) 17 g packet Take 17 g by mouth daily as needed.  senna (SENOKOT) 8.6 MG tablet Take 2 tablets (17.2 mg total) by mouth at bedtime. 180 tablet 3   traZODone (DESYREL) 100 MG tablet Take 1 tablet (100 mg total) by mouth daily. 30 tablet 0   No current facility-administered medications on file prior to visit.    Review of Systems    Objective:    There were no vitals taken for this visit. BP Readings from Last 3 Encounters:  11/30/22 (!) 131/96  10/29/22 122/77  10/23/22 (!) 158/82   Wt Readings from Last 3 Encounters:  11/30/22 224 lb 10.4 oz (101.9 kg)  10/29/22 230 lb (104.3 kg)  10/23/22 226 lb 12.8 oz (102.9 kg)    Physical Exam

## 2022-12-11 DIAGNOSIS — K5909 Other constipation: Secondary | ICD-10-CM | POA: Diagnosis not present

## 2022-12-11 DIAGNOSIS — Z7901 Long term (current) use of anticoagulants: Secondary | ICD-10-CM | POA: Diagnosis not present

## 2022-12-11 DIAGNOSIS — E038 Other specified hypothyroidism: Secondary | ICD-10-CM | POA: Diagnosis not present

## 2022-12-11 DIAGNOSIS — F0393 Unspecified dementia, unspecified severity, with mood disturbance: Secondary | ICD-10-CM | POA: Diagnosis not present

## 2022-12-11 DIAGNOSIS — R5381 Other malaise: Secondary | ICD-10-CM | POA: Diagnosis not present

## 2022-12-11 DIAGNOSIS — E878 Other disorders of electrolyte and fluid balance, not elsewhere classified: Secondary | ICD-10-CM | POA: Diagnosis not present

## 2022-12-11 DIAGNOSIS — Z9181 History of falling: Secondary | ICD-10-CM | POA: Diagnosis not present

## 2022-12-11 DIAGNOSIS — M6281 Muscle weakness (generalized): Secondary | ICD-10-CM | POA: Diagnosis not present

## 2022-12-11 DIAGNOSIS — I480 Paroxysmal atrial fibrillation: Secondary | ICD-10-CM | POA: Diagnosis not present

## 2022-12-11 DIAGNOSIS — K219 Gastro-esophageal reflux disease without esophagitis: Secondary | ICD-10-CM | POA: Diagnosis not present

## 2022-12-14 DIAGNOSIS — R5381 Other malaise: Secondary | ICD-10-CM | POA: Diagnosis not present

## 2022-12-14 DIAGNOSIS — E878 Other disorders of electrolyte and fluid balance, not elsewhere classified: Secondary | ICD-10-CM | POA: Diagnosis not present

## 2022-12-14 DIAGNOSIS — M6281 Muscle weakness (generalized): Secondary | ICD-10-CM | POA: Diagnosis not present

## 2022-12-14 DIAGNOSIS — E038 Other specified hypothyroidism: Secondary | ICD-10-CM | POA: Diagnosis not present

## 2022-12-14 DIAGNOSIS — I480 Paroxysmal atrial fibrillation: Secondary | ICD-10-CM | POA: Diagnosis not present

## 2022-12-14 DIAGNOSIS — F0393 Unspecified dementia, unspecified severity, with mood disturbance: Secondary | ICD-10-CM | POA: Diagnosis not present

## 2022-12-14 DIAGNOSIS — Z7901 Long term (current) use of anticoagulants: Secondary | ICD-10-CM | POA: Diagnosis not present

## 2022-12-14 DIAGNOSIS — Z9181 History of falling: Secondary | ICD-10-CM | POA: Diagnosis not present

## 2022-12-14 DIAGNOSIS — K5909 Other constipation: Secondary | ICD-10-CM | POA: Diagnosis not present

## 2022-12-23 ENCOUNTER — Other Ambulatory Visit: Payer: Self-pay | Admitting: Nurse Practitioner

## 2022-12-23 DIAGNOSIS — I4891 Unspecified atrial fibrillation: Secondary | ICD-10-CM

## 2022-12-23 DIAGNOSIS — I509 Heart failure, unspecified: Secondary | ICD-10-CM

## 2022-12-28 DIAGNOSIS — M48062 Spinal stenosis, lumbar region with neurogenic claudication: Secondary | ICD-10-CM | POA: Diagnosis not present

## 2022-12-29 DIAGNOSIS — R4182 Altered mental status, unspecified: Secondary | ICD-10-CM

## 2022-12-31 DIAGNOSIS — M48062 Spinal stenosis, lumbar region with neurogenic claudication: Secondary | ICD-10-CM | POA: Diagnosis not present

## 2023-01-04 DIAGNOSIS — G4733 Obstructive sleep apnea (adult) (pediatric): Secondary | ICD-10-CM | POA: Diagnosis not present

## 2023-01-04 DIAGNOSIS — F3341 Major depressive disorder, recurrent, in partial remission: Secondary | ICD-10-CM | POA: Diagnosis not present

## 2023-01-04 DIAGNOSIS — I482 Chronic atrial fibrillation, unspecified: Secondary | ICD-10-CM | POA: Diagnosis not present

## 2023-01-04 DIAGNOSIS — I10 Essential (primary) hypertension: Secondary | ICD-10-CM | POA: Diagnosis not present

## 2023-01-04 DIAGNOSIS — Z23 Encounter for immunization: Secondary | ICD-10-CM | POA: Diagnosis not present

## 2023-01-05 DIAGNOSIS — M48062 Spinal stenosis, lumbar region with neurogenic claudication: Secondary | ICD-10-CM | POA: Diagnosis not present

## 2023-01-11 ENCOUNTER — Other Ambulatory Visit: Payer: Self-pay

## 2023-01-11 ENCOUNTER — Emergency Department: Payer: PPO

## 2023-01-11 ENCOUNTER — Emergency Department
Admission: EM | Admit: 2023-01-11 | Discharge: 2023-01-11 | Disposition: A | Discharge status: Home / Self Care | Payer: PPO | Attending: Emergency Medicine | Admitting: Emergency Medicine

## 2023-01-11 DIAGNOSIS — S81812A Laceration without foreign body, left lower leg, initial encounter: Secondary | ICD-10-CM | POA: Diagnosis not present

## 2023-01-11 DIAGNOSIS — M1811 Unilateral primary osteoarthritis of first carpometacarpal joint, right hand: Secondary | ICD-10-CM | POA: Diagnosis not present

## 2023-01-11 DIAGNOSIS — S199XXA Unspecified injury of neck, initial encounter: Secondary | ICD-10-CM | POA: Diagnosis not present

## 2023-01-11 DIAGNOSIS — W19XXXA Unspecified fall, initial encounter: Secondary | ICD-10-CM | POA: Diagnosis not present

## 2023-01-11 DIAGNOSIS — S61411A Laceration without foreign body of right hand, initial encounter: Secondary | ICD-10-CM | POA: Insufficient documentation

## 2023-01-11 DIAGNOSIS — Z043 Encounter for examination and observation following other accident: Secondary | ICD-10-CM | POA: Diagnosis not present

## 2023-01-11 DIAGNOSIS — E860 Dehydration: Secondary | ICD-10-CM | POA: Insufficient documentation

## 2023-01-11 DIAGNOSIS — S0101XA Laceration without foreign body of scalp, initial encounter: Secondary | ICD-10-CM | POA: Diagnosis not present

## 2023-01-11 DIAGNOSIS — W01198A Fall on same level from slipping, tripping and stumbling with subsequent striking against other object, initial encounter: Secondary | ICD-10-CM | POA: Diagnosis not present

## 2023-01-11 DIAGNOSIS — S0181XA Laceration without foreign body of other part of head, initial encounter: Secondary | ICD-10-CM | POA: Insufficient documentation

## 2023-01-11 DIAGNOSIS — Z96652 Presence of left artificial knee joint: Secondary | ICD-10-CM | POA: Diagnosis not present

## 2023-01-11 DIAGNOSIS — S0990XA Unspecified injury of head, initial encounter: Secondary | ICD-10-CM | POA: Diagnosis not present

## 2023-01-11 LAB — CBC WITH DIFFERENTIAL/PLATELET
Abs Immature Granulocytes: 0.04 10*3/uL (ref 0.00–0.07)
Basophils Absolute: 0.1 10*3/uL (ref 0.0–0.1)
Basophils Relative: 1 %
Eosinophils Absolute: 0.1 10*3/uL (ref 0.0–0.5)
Eosinophils Relative: 1 %
HCT: 44.2 % (ref 36.0–46.0)
Hemoglobin: 14.4 g/dL (ref 12.0–15.0)
Immature Granulocytes: 0 %
Lymphocytes Relative: 14 %
Lymphs Abs: 1.7 10*3/uL (ref 0.7–4.0)
MCH: 33.2 pg (ref 26.0–34.0)
MCHC: 32.6 g/dL (ref 30.0–36.0)
MCV: 101.8 fL — ABNORMAL HIGH (ref 80.0–100.0)
Monocytes Absolute: 1 10*3/uL (ref 0.1–1.0)
Monocytes Relative: 8 %
Neutro Abs: 9.4 10*3/uL — ABNORMAL HIGH (ref 1.7–7.7)
Neutrophils Relative %: 76 %
Platelets: 202 10*3/uL (ref 150–400)
RBC: 4.34 MIL/uL (ref 3.87–5.11)
RDW: 13.5 % (ref 11.5–15.5)
WBC: 12.3 10*3/uL — ABNORMAL HIGH (ref 4.0–10.5)
nRBC: 0 % (ref 0.0–0.2)

## 2023-01-11 LAB — URINALYSIS, ROUTINE W REFLEX MICROSCOPIC
Bacteria, UA: NONE SEEN
Bilirubin Urine: NEGATIVE
Glucose, UA: NEGATIVE mg/dL
Hgb urine dipstick: NEGATIVE
Ketones, ur: NEGATIVE mg/dL
Nitrite: POSITIVE — AB
Protein, ur: NEGATIVE mg/dL
Specific Gravity, Urine: 1.008 (ref 1.005–1.030)
Squamous Epithelial / HPF: 0 /[HPF] (ref 0–5)
pH: 7 (ref 5.0–8.0)

## 2023-01-11 LAB — BASIC METABOLIC PANEL
Anion gap: 13 (ref 5–15)
BUN: 33 mg/dL — ABNORMAL HIGH (ref 8–23)
CO2: 28 mmol/L (ref 22–32)
Calcium: 8.8 mg/dL — ABNORMAL LOW (ref 8.9–10.3)
Chloride: 99 mmol/L (ref 98–111)
Creatinine, Ser: 1.22 mg/dL — ABNORMAL HIGH (ref 0.44–1.00)
GFR, Estimated: 46 mL/min — ABNORMAL LOW (ref >60)
Glucose, Bld: 90 mg/dL (ref 70–99)
Potassium: 3.1 mmol/L — ABNORMAL LOW (ref 3.5–5.1)
Sodium: 140 mmol/L (ref 135–145)

## 2023-01-11 MED ORDER — LIDOCAINE HCL (PF) 1 % IJ SOLN
10.0000 mL | Freq: Once | INTRAMUSCULAR | Status: AC
  Administered 2023-01-11: 10 mL
  Filled 2023-01-11: qty 10

## 2023-01-11 MED ORDER — SODIUM CHLORIDE 0.9 % IV BOLUS: 500.0000 mL | Freq: Once | INTRAVENOUS | Status: DC

## 2023-01-11 MED ORDER — FOSFOMYCIN TROMETHAMINE 3 G PO PACK
3.0000 g | PACK | Freq: Once | ORAL | Status: AC
  Administered 2023-01-11: 3 g via ORAL
  Filled 2023-01-11: qty 3

## 2023-01-11 NOTE — ED Notes (Signed)
Pt A&O x4, no obvious distress noted, respirations regular/unlabored. Pt verbalizes understanding of discharge instructions. Pt able to ambulate from ED independently.   

## 2023-01-11 NOTE — Discharge Instructions (Addendum)
The sutures in her hand and face will absorb on their own. You will need to have the sutures in your leg removed by your physician.  Please follow-up with your doctor for recheck/reevaluation.  Return to the emergency department for any symptoms personally concerning to yourself.

## 2023-01-11 NOTE — ED Provider Notes (Addendum)
-----------------------------------------   5:21 PM on 01/11/2023 ----------------------------------------- Patient care assumed from Dr. Derrill Kay.  Patient is presenting from home after a fall, hit her head on a metal frame for a door.  Patient had several lacerations 1 of which required repair. Patient's workup today shows slight leukocytosis of 12,000 overall reassuring chemistry, CT scan of the head does not appear to show any significant findings no intracranial findings. Patient's hand x-ray shows no fracture or dislocation.  Patient's urinalysis does show nitrite positive mild amount of white blood cells possibly indicating urinary tract infection.  We will cover the one-time dose of fosfomycin.  Otherwise patient states she is feeling well she does have decent hematoma to the left face/forehead.  We will ambulate the patient to ensure she is able to walk with use of walker which is her baseline.  Lungs patient can ambulate we will discharge home.   Patient is able to ambulate with a walker without issue.  Will discharge home.   Minna Antis, MD 01/11/23 Windell Moment

## 2023-01-11 NOTE — ED Notes (Signed)
MD at bedside for stitches

## 2023-01-11 NOTE — ED Provider Triage Note (Signed)
Emergency Medicine Provider Triage Evaluation Note  Robin Espinoza , a 77 y.o. female  was evaluated in triage.  Pt complains of fall, hit head, hand, and leg.  Review of Systems  Positive:  Negative:   Physical Exam  There were no vitals taken for this visit. Gen:   Awake, no distress   Resp:  Normal effort  MSK:   Moves extremities without difficulty  Other:    Medical Decision Making  Medically screening exam initiated at 10:53 AM.  Appropriate orders placed.  Robin Espinoza was informed that the remainder of the evaluation will be completed by another provider, this initial triage assessment does not replace that evaluation, and the importance of remaining in the ED until their evaluation is complete.  Cts, xrays ordered, add ua as patient smells of urine   Faythe Ghee, PA-C 01/11/23 1054

## 2023-01-11 NOTE — ED Provider Notes (Signed)
[facility] Provider Note    Event Date/Time   First MD Initiated Contact with Patient 01/11/23 1257     (approximate)   History   Fall   HPI  Robin Espinoza is a 77 y.o. female presents to the emergency department today because of concerns for a fall.  The patient thinks that she tripped and that is what caused the fall.  She did injure her right hand, left leg and hit the left side of her head.  Patient does state that she has had a quite a bit of medical problems recently.  Husband has noticed some confusion over the past day.     Physical Exam   Triage Vital Signs: ED Triage Vitals [01/11/23 1053]  Encounter Vitals Group     BP 109/80     Systolic BP Percentile      Diastolic BP Percentile      Pulse Rate (!) 55     Resp 18     Temp 98.3 F (36.8 C)     Temp Source Oral     SpO2 97 %     Weight      Height      Head Circumference      Peak Flow      Pain Score 3     Pain Loc      Pain Education      Exclude from Growth Chart     Most recent vital signs: Vitals:   01/11/23 1053  BP: 109/80  Pulse: (!) 55  Resp: 18  Temp: 98.3 F (36.8 C)  SpO2: 97%   General: Awake, alert, oriented. CV:  Good peripheral perfusion.  Resp:  Normal effort.  Abd:  No distention.  Other:  Stjulien stellate laceration to left temple, Husain laceration to right hand, larger laceration to left shin.   ED Results / Procedures / Treatments   Labs (all labs ordered are listed, but only abnormal results are displayed) Labs Reviewed  CBC WITH DIFFERENTIAL/PLATELET - Abnormal; Notable for the following components:      Result Value   WBC 12.3 (*)    MCV 101.8 (*)    Neutro Abs 9.4 (*)    All other components within normal limits  BASIC METABOLIC PANEL - Abnormal; Notable for the following components:   Potassium 3.1 (*)    BUN 33 (*)    Creatinine, Ser 1.22 (*)    Calcium 8.8 (*)    GFR, Estimated 46 (*)    All other components within normal  limits  URINALYSIS, ROUTINE W REFLEX MICROSCOPIC     EKG  None   RADIOLOGY I independently interpreted and visualized the right hand. My interpretation: No fracture Radiology interpretation:  IMPRESSION:  1. No acute fracture or dislocation identified about the right hand.  2. 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    I independently interpreted and visualized the left tib/fib. My interpretation: No fracture Radiology interpretation:  IMPRESSION:  1. Soft tissue injury to the medial distal lower leg with no acute  tib fib fracture or dislocation.  2. Partially visible left total knee arthroplasty.      PROCEDURES:  Critical Care performed: No  Procedures  LACERATION REPAIR Performed by: Phineas Semen Authorized by: Phineas Semen Consent: Verbal consent obtained. Risks and benefits: risks, benefits and alternatives were discussed Consent given by: patient Patient identity confirmed: provided demographic data Prepped and Draped in normal sterile fashion Wound explored  Laceration Location: left temple  Laceration Length: 2cm  No Foreign Bodies seen or palpated  Anesthesia: local infiltration  Local anesthetic: lidocaine 1% without epinephrine  Anesthetic total: 1 ml  Irrigation method: syringe Amount of cleaning: standard  Skin closure: 5-0 vicryl rapide  Number of sutures: 6  Technique: simple interrupted  Patient tolerance: Patient tolerated the procedure well with no immediate complications.  LACERATION REPAIR Performed by: Phineas Semen Authorized by: Phineas Semen Consent: Verbal consent obtained. Risks and benefits: risks, benefits and alternatives were discussed Consent given by: patient Patient identity confirmed: provided demographic data Prepped and Draped in normal sterile fashion Wound explored  Laceration Location: right hand  Laceration Length: 1.5cm  No Foreign Bodies seen  or palpated  Anesthesia: local infiltration  Local anesthetic: lidocaine 1% without epinephrine  Anesthetic total: 1 ml  Irrigation method: syringe Amount of cleaning: standard  Skin closure: 5-0 vicryl rapide  Number of sutures: 4  Technique: simple interrupted  Patient tolerance: Patient tolerated the procedure well with no immediate complications.  LACERATION REPAIR Performed by: Phineas Semen Authorized by: Phineas Semen Consent: Verbal consent obtained. Risks and benefits: risks, benefits and alternatives were discussed Consent given by: patient Patient identity confirmed: provided demographic data Prepped and Draped in normal sterile fashion Wound explored  Laceration Location: left shin  Laceration Length: 4 cm  No Foreign Bodies seen or palpated  Anesthesia: local infiltration  Local anesthetic: lidocaine 1% without epinephrine  Anesthetic total: 4 ml  Irrigation method: syringe Amount of cleaning: standard  Skin closure: 4-0 ethanol  Number of sutures: 2 simple interrupted 1 continuous  Patient tolerance: Patient tolerated the procedure well with no immediate complications.   MEDICATIONS ORDERED IN ED: Medications  lidocaine (PF) (XYLOCAINE) 1 % injection 10 mL (has no administration in time range)     IMPRESSION / MDM / ASSESSMENT AND PLAN / ED COURSE  I reviewed the triage vital signs and the nursing notes.                              Differential diagnosis includes, but is not limited to, anemia, UTI, dehydration, intracranial bleed, fracture  Patient's presentation is most consistent with acute presentation with potential threat to life or bodily function.  Patient presented to the emergency department today after suffering a fall.  There is report the patient has been increasingly weak.  On exam patient has lacerations to her left temple, right hand and left shin.  These were sutured closed.  Imaging of the leg did not show any  fracture.  I did not appreciate any intracranial hemorrhage.  Blood work is consistent with slight dehydration.  Patient was given IV fluids here. Awaiting UA at time of sign out.      FINAL CLINICAL IMPRESSION(S) / ED DIAGNOSES   Fall Lacerations   Note:  This document was prepared using Dragon voice recognition software and may include unintentional dictation errors.    Phineas Semen, MD 01/12/23 [phone]

## 2023-01-11 NOTE — ED Triage Notes (Signed)
PT BIB EMS from home. Pt fell and hit head on metal frame of door. Pt has laceration to left side of head, left lower leg and right hand. Pt states no LOC but is on blood thinners. Pt family member states she is not on blood thinners. Pt states she has been falling more recently.A&O x4

## 2023-01-12 DIAGNOSIS — M48062 Spinal stenosis, lumbar region with neurogenic claudication: Secondary | ICD-10-CM | POA: Diagnosis not present

## 2023-01-19 DIAGNOSIS — H6123 Impacted cerumen, bilateral: Secondary | ICD-10-CM | POA: Diagnosis not present

## 2023-01-19 DIAGNOSIS — I4892 Unspecified atrial flutter: Secondary | ICD-10-CM | POA: Diagnosis not present

## 2023-01-19 DIAGNOSIS — M48062 Spinal stenosis, lumbar region with neurogenic claudication: Secondary | ICD-10-CM | POA: Diagnosis not present

## 2023-01-19 DIAGNOSIS — I480 Paroxysmal atrial fibrillation: Secondary | ICD-10-CM | POA: Diagnosis not present

## 2023-01-19 DIAGNOSIS — E038 Other specified hypothyroidism: Secondary | ICD-10-CM | POA: Diagnosis not present

## 2023-01-19 DIAGNOSIS — F0284 Dementia in other diseases classified elsewhere, unspecified severity, with anxiety: Secondary | ICD-10-CM | POA: Diagnosis not present

## 2023-01-19 DIAGNOSIS — E538 Deficiency of other specified B group vitamins: Secondary | ICD-10-CM | POA: Diagnosis not present

## 2023-01-19 DIAGNOSIS — I7143 Infrarenal abdominal aortic aneurysm, without rupture: Secondary | ICD-10-CM | POA: Diagnosis not present

## 2023-01-25 DIAGNOSIS — G4733 Obstructive sleep apnea (adult) (pediatric): Secondary | ICD-10-CM | POA: Diagnosis not present

## 2023-01-26 DIAGNOSIS — M48062 Spinal stenosis, lumbar region with neurogenic claudication: Secondary | ICD-10-CM | POA: Diagnosis not present

## 2023-02-02 DIAGNOSIS — M48062 Spinal stenosis, lumbar region with neurogenic claudication: Secondary | ICD-10-CM | POA: Diagnosis not present

## 2023-02-16 DIAGNOSIS — I5032 Chronic diastolic (congestive) heart failure: Secondary | ICD-10-CM | POA: Diagnosis not present

## 2023-02-16 DIAGNOSIS — I482 Chronic atrial fibrillation, unspecified: Secondary | ICD-10-CM | POA: Diagnosis not present

## 2023-02-16 DIAGNOSIS — N1831 Chronic kidney disease, stage 3a: Secondary | ICD-10-CM | POA: Diagnosis not present

## 2023-02-16 DIAGNOSIS — E039 Hypothyroidism, unspecified: Secondary | ICD-10-CM | POA: Diagnosis not present

## 2023-02-16 DIAGNOSIS — F3341 Major depressive disorder, recurrent, in partial remission: Secondary | ICD-10-CM | POA: Diagnosis not present

## 2023-02-16 DIAGNOSIS — G4733 Obstructive sleep apnea (adult) (pediatric): Secondary | ICD-10-CM | POA: Diagnosis not present

## 2023-02-16 DIAGNOSIS — I10 Essential (primary) hypertension: Secondary | ICD-10-CM | POA: Diagnosis not present

## 2023-02-17 ENCOUNTER — Emergency Department: Payer: PPO

## 2023-02-17 ENCOUNTER — Other Ambulatory Visit: Payer: Self-pay

## 2023-02-17 ENCOUNTER — Observation Stay
Admission: EM | Admit: 2023-02-17 | Discharge: 2023-02-24 | Disposition: A | Discharge status: Home Health | Payer: PPO | Attending: Student | Admitting: Student

## 2023-02-17 DIAGNOSIS — E876 Hypokalemia: Secondary | ICD-10-CM | POA: Diagnosis not present

## 2023-02-17 DIAGNOSIS — I700 Atherosclerosis of aorta: Secondary | ICD-10-CM | POA: Diagnosis not present

## 2023-02-17 DIAGNOSIS — R001 Bradycardia, unspecified: Secondary | ICD-10-CM | POA: Diagnosis not present

## 2023-02-17 DIAGNOSIS — E860 Dehydration: Secondary | ICD-10-CM | POA: Insufficient documentation

## 2023-02-17 DIAGNOSIS — M1990 Unspecified osteoarthritis, unspecified site: Secondary | ICD-10-CM | POA: Diagnosis present

## 2023-02-17 DIAGNOSIS — K5904 Chronic idiopathic constipation: Secondary | ICD-10-CM | POA: Diagnosis present

## 2023-02-17 DIAGNOSIS — Z87891 Personal history of nicotine dependence: Secondary | ICD-10-CM | POA: Diagnosis not present

## 2023-02-17 DIAGNOSIS — D72829 Elevated white blood cell count, unspecified: Secondary | ICD-10-CM | POA: Diagnosis not present

## 2023-02-17 DIAGNOSIS — S12601A Unspecified nondisplaced fracture of seventh cervical vertebra, initial encounter for closed fracture: Secondary | ICD-10-CM | POA: Diagnosis not present

## 2023-02-17 DIAGNOSIS — S12500A Unspecified displaced fracture of sixth cervical vertebra, initial encounter for closed fracture: Secondary | ICD-10-CM | POA: Diagnosis not present

## 2023-02-17 DIAGNOSIS — Z7901 Long term (current) use of anticoagulants: Secondary | ICD-10-CM | POA: Insufficient documentation

## 2023-02-17 DIAGNOSIS — E039 Hypothyroidism, unspecified: Secondary | ICD-10-CM | POA: Insufficient documentation

## 2023-02-17 DIAGNOSIS — I10 Essential (primary) hypertension: Secondary | ICD-10-CM | POA: Diagnosis not present

## 2023-02-17 DIAGNOSIS — W19XXXA Unspecified fall, initial encounter: Principal | ICD-10-CM

## 2023-02-17 DIAGNOSIS — S0990XA Unspecified injury of head, initial encounter: Secondary | ICD-10-CM | POA: Diagnosis not present

## 2023-02-17 DIAGNOSIS — E785 Hyperlipidemia, unspecified: Secondary | ICD-10-CM | POA: Diagnosis not present

## 2023-02-17 DIAGNOSIS — F419 Anxiety disorder, unspecified: Secondary | ICD-10-CM | POA: Diagnosis present

## 2023-02-17 DIAGNOSIS — R296 Repeated falls: Secondary | ICD-10-CM | POA: Diagnosis not present

## 2023-02-17 DIAGNOSIS — R29898 Other symptoms and signs involving the musculoskeletal system: Secondary | ICD-10-CM | POA: Diagnosis present

## 2023-02-17 DIAGNOSIS — R531 Weakness: Secondary | ICD-10-CM | POA: Diagnosis not present

## 2023-02-17 DIAGNOSIS — Z79899 Other long term (current) drug therapy: Secondary | ICD-10-CM | POA: Diagnosis not present

## 2023-02-17 DIAGNOSIS — M50222 Other cervical disc displacement at C5-C6 level: Secondary | ICD-10-CM | POA: Diagnosis not present

## 2023-02-17 DIAGNOSIS — M546 Pain in thoracic spine: Secondary | ICD-10-CM | POA: Diagnosis not present

## 2023-02-17 DIAGNOSIS — Y9301 Activity, walking, marching and hiking: Secondary | ICD-10-CM | POA: Insufficient documentation

## 2023-02-17 DIAGNOSIS — M4804 Spinal stenosis, thoracic region: Secondary | ICD-10-CM | POA: Diagnosis not present

## 2023-02-17 DIAGNOSIS — M47816 Spondylosis without myelopathy or radiculopathy, lumbar region: Secondary | ICD-10-CM | POA: Diagnosis not present

## 2023-02-17 DIAGNOSIS — K8020 Calculus of gallbladder without cholecystitis without obstruction: Secondary | ICD-10-CM | POA: Diagnosis not present

## 2023-02-17 DIAGNOSIS — S12501A Unspecified nondisplaced fracture of sixth cervical vertebra, initial encounter for closed fracture: Secondary | ICD-10-CM | POA: Insufficient documentation

## 2023-02-17 DIAGNOSIS — M4802 Spinal stenosis, cervical region: Secondary | ICD-10-CM | POA: Diagnosis not present

## 2023-02-17 DIAGNOSIS — Y92199 Unspecified place in other specified residential institution as the place of occurrence of the external cause: Secondary | ICD-10-CM | POA: Diagnosis not present

## 2023-02-17 DIAGNOSIS — K219 Gastro-esophageal reflux disease without esophagitis: Secondary | ICD-10-CM | POA: Insufficient documentation

## 2023-02-17 DIAGNOSIS — M47814 Spondylosis without myelopathy or radiculopathy, thoracic region: Secondary | ICD-10-CM | POA: Diagnosis not present

## 2023-02-17 DIAGNOSIS — F339 Major depressive disorder, recurrent, unspecified: Secondary | ICD-10-CM | POA: Diagnosis present

## 2023-02-17 DIAGNOSIS — I482 Chronic atrial fibrillation, unspecified: Secondary | ICD-10-CM | POA: Diagnosis not present

## 2023-02-17 DIAGNOSIS — G4733 Obstructive sleep apnea (adult) (pediatric): Secondary | ICD-10-CM | POA: Diagnosis present

## 2023-02-17 DIAGNOSIS — M549 Dorsalgia, unspecified: Secondary | ICD-10-CM | POA: Diagnosis not present

## 2023-02-17 DIAGNOSIS — Z96651 Presence of right artificial knee joint: Secondary | ICD-10-CM | POA: Insufficient documentation

## 2023-02-17 DIAGNOSIS — S12600A Unspecified displaced fracture of seventh cervical vertebra, initial encounter for closed fracture: Secondary | ICD-10-CM | POA: Diagnosis not present

## 2023-02-17 DIAGNOSIS — S12591A Other nondisplaced fracture of sixth cervical vertebra, initial encounter for closed fracture: Secondary | ICD-10-CM | POA: Diagnosis not present

## 2023-02-17 DIAGNOSIS — M6281 Muscle weakness (generalized): Secondary | ICD-10-CM | POA: Diagnosis present

## 2023-02-17 DIAGNOSIS — M47812 Spondylosis without myelopathy or radiculopathy, cervical region: Secondary | ICD-10-CM | POA: Diagnosis not present

## 2023-02-17 DIAGNOSIS — M48061 Spinal stenosis, lumbar region without neurogenic claudication: Secondary | ICD-10-CM | POA: Diagnosis not present

## 2023-02-17 DIAGNOSIS — M5021 Other cervical disc displacement,  high cervical region: Secondary | ICD-10-CM | POA: Diagnosis not present

## 2023-02-17 DIAGNOSIS — M545 Low back pain, unspecified: Secondary | ICD-10-CM | POA: Diagnosis not present

## 2023-02-17 DIAGNOSIS — R0902 Hypoxemia: Secondary | ICD-10-CM | POA: Diagnosis not present

## 2023-02-17 LAB — URINALYSIS, ROUTINE W REFLEX MICROSCOPIC
Bacteria, UA: NONE SEEN
Bilirubin Urine: NEGATIVE
Glucose, UA: NEGATIVE mg/dL
Ketones, ur: NEGATIVE mg/dL
Leukocytes,Ua: NEGATIVE
Nitrite: NEGATIVE
Protein, ur: NEGATIVE mg/dL
Specific Gravity, Urine: 1.008 (ref 1.005–1.030)
Squamous Epithelial / HPF: 0 /[HPF] (ref 0–5)
pH: 6 (ref 5.0–8.0)

## 2023-02-17 LAB — CBC
HCT: 39.3 % (ref 36.0–46.0)
Hemoglobin: 13.3 g/dL (ref 12.0–15.0)
MCH: 33.1 pg (ref 26.0–34.0)
MCHC: 33.8 g/dL (ref 30.0–36.0)
MCV: 97.8 fL (ref 80.0–100.0)
Platelets: 176 10*3/uL (ref 150–400)
RBC: 4.02 MIL/uL (ref 3.87–5.11)
RDW: 12.7 % (ref 11.5–15.5)
WBC: 11.2 10*3/uL — ABNORMAL HIGH (ref 4.0–10.5)
nRBC: 0 % (ref 0.0–0.2)

## 2023-02-17 LAB — BASIC METABOLIC PANEL
Anion gap: 12 (ref 5–15)
BUN: 27 mg/dL — ABNORMAL HIGH (ref 8–23)
CO2: 33 mmol/L — ABNORMAL HIGH (ref 22–32)
Calcium: 8.7 mg/dL — ABNORMAL LOW (ref 8.9–10.3)
Chloride: 90 mmol/L — ABNORMAL LOW (ref 98–111)
Creatinine, Ser: 1.11 mg/dL — ABNORMAL HIGH (ref 0.44–1.00)
GFR, Estimated: 51 mL/min — ABNORMAL LOW (ref >60)
Glucose, Bld: 100 mg/dL — ABNORMAL HIGH (ref 70–99)
Potassium: 3.1 mmol/L — ABNORMAL LOW (ref 3.5–5.1)
Sodium: 135 mmol/L (ref 135–145)

## 2023-02-17 LAB — TROPONIN I (HIGH SENSITIVITY)
Troponin I (High Sensitivity): 12 ng/L (ref <18)
Troponin I (High Sensitivity): 11 ng/L (ref <18)

## 2023-02-17 LAB — MAGNESIUM: Magnesium: 1.8 mg/dL (ref 1.7–2.4)

## 2023-02-17 MED ORDER — HYDRALAZINE HCL 20 MG/ML IJ SOLN: 5.0000 mg | Freq: Four times a day (QID) | INTRAMUSCULAR | Status: DC | PRN

## 2023-02-17 MED ORDER — MORPHINE SULFATE (PF) 4 MG/ML IV SOLN
4.0000 mg | Freq: Once | INTRAVENOUS | Status: AC
  Administered 2023-02-17: 4 mg via INTRAVENOUS
  Filled 2023-02-17: qty 1

## 2023-02-17 MED ORDER — CELECOXIB 200 MG PO CAPS: 200.0000 mg | ORAL_CAPSULE | Freq: Two times a day (BID) | ORAL | Status: DC

## 2023-02-17 MED ORDER — ONDANSETRON HCL 4 MG/2ML IJ SOLN: 4.0000 mg | Freq: Four times a day (QID) | INTRAMUSCULAR | Status: DC | PRN

## 2023-02-17 MED ORDER — LEVOTHYROXINE SODIUM 50 MCG PO TABS
50.0000 ug | ORAL_TABLET | Freq: Every day | ORAL | Status: DC
  Administered 2023-02-18 – 2023-02-24 (×7): 50 ug via ORAL
  Filled 2023-02-17 (×7): qty 1

## 2023-02-17 MED ORDER — SENNA 8.6 MG PO TABS
2.0000 | ORAL_TABLET | Freq: Every day | ORAL | Status: DC
Start: 2023-02-17 — End: 2023-02-24
  Administered 2023-02-17 – 2023-02-23 (×7): 17.2 mg via ORAL
  Filled 2023-02-17 (×7): qty 2

## 2023-02-17 MED ORDER — METOPROLOL SUCCINATE ER 50 MG PO TB24
50.0000 mg | ORAL_TABLET | Freq: Every day | ORAL | Status: DC
  Administered 2023-02-18 – 2023-02-24 (×7): 50 mg via ORAL
  Filled 2023-02-17 (×7): qty 1

## 2023-02-17 MED ORDER — METHOCARBAMOL 1000 MG/10ML IJ SOLN: 500.0000 mg | Freq: Four times a day (QID) | INTRAMUSCULAR | Status: DC | PRN

## 2023-02-17 MED ORDER — POLYETHYLENE GLYCOL 3350 17 G PO PACK
17.0000 g | PACK | Freq: Every day | ORAL | Status: DC | PRN
  Administered 2023-02-18: 17 g via ORAL
  Filled 2023-02-17: qty 1

## 2023-02-17 MED ORDER — PRAVASTATIN SODIUM 40 MG PO TABS
40.0000 mg | ORAL_TABLET | Freq: Every day | ORAL | Status: DC
  Administered 2023-02-18 – 2023-02-23 (×6): 40 mg via ORAL
  Filled 2023-02-17 (×6): qty 1

## 2023-02-17 MED ORDER — APIXABAN 5 MG PO TABS
5.0000 mg | ORAL_TABLET | Freq: Two times a day (BID) | ORAL | Status: DC
  Administered 2023-02-17 – 2023-02-24 (×14): 5 mg via ORAL
  Filled 2023-02-17 (×14): qty 1

## 2023-02-17 MED ORDER — DULOXETINE HCL 30 MG PO CPEP
60.0000 mg | ORAL_CAPSULE | Freq: Every day | ORAL | Status: DC
  Administered 2023-02-18 – 2023-02-24 (×7): 60 mg via ORAL
  Filled 2023-02-17 (×4): qty 2
  Filled 2023-02-17: qty 1
  Filled 2023-02-17 (×2): qty 2

## 2023-02-17 MED ORDER — ACETAMINOPHEN 650 MG RE SUPP: 650.0000 mg | Freq: Four times a day (QID) | RECTAL | Status: DC | PRN

## 2023-02-17 MED ORDER — SODIUM CHLORIDE 0.9% FLUSH
3.0000 mL | Freq: Two times a day (BID) | INTRAVENOUS | Status: DC
  Administered 2023-02-17 – 2023-02-23 (×12): 3 mL via INTRAVENOUS

## 2023-02-17 MED ORDER — ONDANSETRON HCL 4 MG PO TABS: 4.0000 mg | ORAL_TABLET | Freq: Four times a day (QID) | ORAL | Status: DC | PRN

## 2023-02-17 MED ORDER — GABAPENTIN 100 MG PO CAPS
200.0000 mg | ORAL_CAPSULE | Freq: Two times a day (BID) | ORAL | Status: DC
  Administered 2023-02-17 – 2023-02-24 (×14): 200 mg via ORAL
  Filled 2023-02-17 (×14): qty 2

## 2023-02-17 MED ORDER — KETOROLAC TROMETHAMINE 15 MG/ML IJ SOLN
15.0000 mg | Freq: Four times a day (QID) | INTRAMUSCULAR | Status: AC | PRN
  Administered 2023-02-18 – 2023-02-22 (×3): 15 mg via INTRAVENOUS
  Filled 2023-02-17 (×3): qty 1

## 2023-02-17 MED ORDER — ONDANSETRON HCL 4 MG/2ML IJ SOLN
4.0000 mg | Freq: Once | INTRAMUSCULAR | Status: AC
  Administered 2023-02-17: 4 mg via INTRAVENOUS
  Filled 2023-02-17: qty 2

## 2023-02-17 MED ORDER — SODIUM CHLORIDE 0.9 % IV SOLN: INTRAVENOUS | Status: DC

## 2023-02-17 MED ORDER — ACETAMINOPHEN 325 MG PO TABS
650.0000 mg | ORAL_TABLET | Freq: Four times a day (QID) | ORAL | Status: DC | PRN
  Administered 2023-02-18 – 2023-02-22 (×7): 650 mg via ORAL
  Filled 2023-02-17 (×7): qty 2

## 2023-02-17 MED ORDER — LEVALBUTEROL HCL 0.63 MG/3ML IN NEBU: 0.6300 mg | INHALATION_SOLUTION | Freq: Four times a day (QID) | RESPIRATORY_TRACT | Status: DC | PRN

## 2023-02-17 MED ORDER — PANTOPRAZOLE SODIUM 40 MG PO TBEC
40.0000 mg | DELAYED_RELEASE_TABLET | Freq: Every day | ORAL | Status: DC
  Administered 2023-02-18 – 2023-02-24 (×7): 40 mg via ORAL
  Filled 2023-02-17 (×7): qty 1

## 2023-02-17 MED ORDER — DOCUSATE SODIUM 100 MG PO CAPS
100.0000 mg | ORAL_CAPSULE | Freq: Two times a day (BID) | ORAL | Status: DC
  Administered 2023-02-18 – 2023-02-24 (×10): 100 mg via ORAL
  Filled 2023-02-17 (×13): qty 1

## 2023-02-17 MED ORDER — AMIODARONE HCL 200 MG PO TABS
200.0000 mg | ORAL_TABLET | Freq: Every day | ORAL | Status: DC
  Administered 2023-02-18 – 2023-02-24 (×7): 200 mg via ORAL
  Filled 2023-02-17 (×7): qty 1

## 2023-02-17 MED ORDER — FLEET ENEMA RE ENEM
1.0000 | ENEMA | Freq: Once | RECTAL | Status: AC | PRN
  Administered 2023-02-19: 1 via RECTAL

## 2023-02-17 MED ORDER — POTASSIUM CHLORIDE CRYS ER 10 MEQ PO TBCR
40.0000 meq | EXTENDED_RELEASE_TABLET | Freq: Once | ORAL | Status: AC
  Administered 2023-02-17: 40 meq via ORAL
  Filled 2023-02-17: qty 4

## 2023-02-17 MED ORDER — ARIPIPRAZOLE 2 MG PO TABS
2.0000 mg | ORAL_TABLET | Freq: Every day | ORAL | Status: DC
  Administered 2023-02-18 – 2023-02-24 (×7): 2 mg via ORAL
  Filled 2023-02-17 (×7): qty 1

## 2023-02-17 NOTE — ED Provider Notes (Signed)
[facility] Provider Note    Event Date/Time   First MD Initiated Contact with Patient 02/17/23 1750     (approximate)   History   Fall (On thinners/)   HPI  Robin Espinoza is a 77 y.o. female here with generalized weakness.  The patient states that earlier today, she was walking when she just felt weak and her legs gave out.  She has a history of fairly subacute deconditioning with progressive difficulty getting around.  She states she falls multiple times due to her legs giving out.  She has noted some mild cough and urinary symptoms.  Denies any known fevers.  She has had poor appetite.  Husband states she is not very mobile.  Denies any other acute complaints.  Denies any direct head trauma.  She did land on her knees and has some mild neck pain after the fall.  No other complaints.  No weakness or numbness.     Physical Exam   Triage Vital Signs: ED Triage Vitals  Encounter Vitals Group     BP 02/17/23 1520 (!) 100/58     Systolic BP Percentile --      Diastolic BP Percentile --      Pulse Rate 02/17/23 1530 70     Resp 02/17/23 1521 18     Temp 02/17/23 1520 98 F (36.7 C)     Temp Source 02/17/23 1924 Oral     SpO2 02/17/23 1521 92 %     Weight --      Height --      Head Circumference --      Peak Flow --      Pain Score 02/17/23 1520 7     Pain Loc --      Pain Education --      Exclude from Growth Chart --     Most recent vital signs: Vitals:   02/17/23 1900 02/17/23 1924  BP: 132/72   Pulse: (!) 59   Resp: 17   Temp:  98.5 F (36.9 C)  SpO2: 93%      General: Awake, no distress.  CV:  Good peripheral perfusion.  Regular rate and rhythm. Resp:  Normal work of breathing.  Lungs with auscultation bilaterally. Abd:  No distention.  No tenderness. Other:  Mild cervical spine tenderness.  Strength out of 5 upper and lower extremities with normal station to light touch.  Cranial nerves intact.   ED Results / Procedures /  Treatments   Labs (all labs ordered are listed, but only abnormal results are displayed) Labs Reviewed  BASIC METABOLIC PANEL - Abnormal; Notable for the following components:      Result Value   Potassium 3.1 (*)    Chloride 90 (*)    CO2 33 (*)    Glucose, Bld 100 (*)    BUN 27 (*)    Creatinine, Ser 1.11 (*)    Calcium 8.7 (*)    GFR, Estimated 51 (*)    All other components within normal limits  CBC - Abnormal; Notable for the following components:   WBC 11.2 (*)    All other components within normal limits  URINALYSIS, ROUTINE W REFLEX MICROSCOPIC - Abnormal; Notable for the following components:   Color, Urine YELLOW (*)    APPearance CLEAR (*)    Hgb urine dipstick [place] (*)    All other components within normal limits  MAGNESIUM  TROPONIN I (HIGH SENSITIVITY)  TROPONIN I (HIGH SENSITIVITY)  EKG Normal sinus rhythm, ventricular at 70.  PR 198, QRS 80, QTc 429.  No acute ST elevations repress.  Acute events of acute ischemia or infarct.   RADIOLOGY CT C-spine: C6/C7 ventral osteophyte, grade 1 spondylolisthesis diffusely, cervical spondylolysis CT head: No acute abnormality  I also independently reviewed and agree with radiologist interpretations.   PROCEDURES:  Critical Care performed: No   MEDICATIONS ORDERED IN ED: Medications  potassium chloride (KLOR-CON M) CR tablet 40 mEq (has no administration in time range)  0.9 %  sodium chloride infusion (has no administration in time range)  morphine (PF) 4 MG/ML injection 4 mg (4 mg Intravenous Given 02/17/23 2018)  ondansetron (ZOFRAN) injection 4 mg (4 mg Intravenous Given 02/17/23 2018)     IMPRESSION / MDM / ASSESSMENT AND PLAN / ED COURSE  I reviewed the triage vital signs and the nursing notes.                              Differential diagnosis includes, but is not limited to, generalized weakness secondary to UTI, pneumonia, deconditioning, anemia, electrolyte abnormality, polypharmacy,  depression  Patient's presentation is most consistent with acute presentation with potential threat to life or bodily function.  The patient is on the cardiac monitor to evaluate for evidence of arrhythmia and/or significant heart rate changes   77 year old female with past medical history of A-fib, depression, hypertension, a flutter, here with generalized weakness and fall as well as neck pain.  Regarding her generalized weakness, this seems to be a somewhat acute on subacute issue, query possible superimposed UTI or metabolic abnormality.  Regarding her neck pain, no upper extremity lower extremity weakness or numbness. CT head and C-spine obtained, shows acute osteophyte fracture at C6-C7.  This appears new from her last CT a month ago, and she does have pain in this area.  Patient placed in cervical collar.  No evidence of cord compression.  Otherwise, UA shows no signs UTI.  Screening lab work is overall reassuring.  Chest x-ray is clear.  She has a mild, nonspecific leukocytosis.  She does have significant neck pain, acute on chronic immobility, and will call to discuss with neurosurgery.  Suspect she may need to be admitted for pain control and PT/OT.  Discussed with Dr. Adriana Simas. Hard collar ordered, MRI ordered, will admit to medicine.   FINAL CLINICAL IMPRESSION(S) / ED DIAGNOSES   Final diagnoses:  Fall, initial encounter  Closed nondisplaced fracture of sixth cervical vertebra, unspecified fracture morphology, initial encounter (HCC)     Rx / DC Orders   ED Discharge Orders     None        Note:  This document was prepared using Dragon voice recognition software and may include unintentional dictation errors.   Shaune Pollack, MD 02/17/23 2034

## 2023-02-17 NOTE — H&P (Signed)
History and Physical    Robin Espinoza:[id number] DOB: [birth year] DOA: 02/17/2023  PCP: Lauro Regulus, MD  Patient coming from: Home  I have personally briefly reviewed patient's old medical records in [facility]  Chief Complaint: Falls/weakness unable to stand or move  HPI: Robin Espinoza is a 77 y.o. female with medical history significant of osteoarthritis this, history of frequent falls uses a walker to ambulate, GERD, depression/anxiety, history of chronic A-fib on anticoagulation, hypertension, hypothyroidism, hyperlipidemia presenting to the ED with generalized weakness after a fall.  Patient and husband state that earlier on today patient was ambulating with a help of a walker and reached out to the wall and subsequently fell.  Patient states her legs just gave out.  Husband states patient with a history of legs giving out and multiple falls.  EMS was called when they came by to assess the patient patient was too weak to even stand up and ambulate and was subsequently brought to the ED.  Patient per husband has been having progressive difficulty getting around.  Patient denies any fevers, no chills, no nausea, no vomiting, no chest pain, no change in chronic shortness of breath, no melena, no hematochezia, no hematemesis, no syncopal episode.  Patient denies any dysuria.  Patient's does endorse bilateral constipation and diarrhea which have subsequently resolved.  Patient with a history of chronic constipation.  Patient also with some complaints of neck pain.  CT head CT C-spine done concerning for a C6-C7 ventral osteophyte fracture acute, ED physician discussed with neurosurgery, Dr. Adriana Simas who recommended Aspen neck collar and MRI of the C-spine.  ED Course: Patient seen in the ED, noted initially to have systolic blood pressures in the 100 which improved to 130s, heart rates in the 50s.  Basic metabolic profile done with a potassium of 3.1, chloride of 90, bicarb of 33,  glucose of 100, BUN of 27, creatinine of 1.11 otherwise within normal limits.  Troponin noted at 12.  CBC done with a white count of 11.2 otherwise within normal limits.  Urinalysis done clear, leukocyte negative, nitrite negative, no bacteria seen, 0-5 WBCs.  CT head CT C-spine done with no evidence of acute intracranial abnormality, mild chronic Mizrahi vessel ischemic changes within the cerebral white matter, ill-defined increased density within the left periorbital soft tissues likely reflecting sequelae of hematoma and laceration which were present at this site on prior exam of 01/11/2023.  Mild bilateral sphenoid sinusitis.  Minimally displaced fracture through C6-C7 ventral osteophyte new from prior cervical spine CT of 01/11/2023 likely acute.  Mild levocurvature of the cervical spine.  Mild grade 1 spondylolisthesis at C3-C4, C4-C5, C5-C6, C7-T1.  Cervical spondylosis.  ED physician discussed with neurosurgery, Dr. Adriana Simas who recommended Aspen collar as well as MRI of the C-spine.  ED physician ordered MRI of the C-spine T-spine and L-spine due to concern for worsening ankylosing spondylitis.  ED physician. Due to significant weakness and inability to ambulate hospitalist were called to admit the patient for further evaluation and management.  Review of Systems: As per HPI otherwise all other systems reviewed and are negative.  Past Medical History:  Diagnosis Date   Anxiety    Depression    Family history of adverse reaction to anesthesia    sister had a reaction when had a baby   GERD (gastroesophageal reflux disease)    Hyperlipidemia 12/21/2005   Hypertension    Hypothyroidism    Insomnia    OAB (overactive bladder)  Obstructive sleep apnea    Osteoarthrosis    Vitamin D deficiency     Past Surgical History:  Procedure Laterality Date   COLONOSCOPY     X 2   Cortisone injections in back     EYE SURGERY     TOTAL KNEE ARTHROPLASTY 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   Procedure: RIGHT TOTAL KNEE  ARTHROPLASTY;  Surgeon: Juanell Fairly, MD;  Location: [facility] ORS;  Service: Orthopedics;  Laterality: Right;   TUBAL LIGATION  1981    Social History  reports that she has quit smoking. She has never used smokeless tobacco. She reports current alcohol use. She reports that she does not use drugs.  Allergies  Allergen Reactions   Oxycodone Other (See Comments)    hallucinated   Amlodipine Swelling    Patient has been tolerating low dose    Family History  Problem Relation Age of Onset   Diabetes Father    Breast cancer Neg Hx    Father deceased age 84 from an acute MI.  Mother deceased age 33 from abdominal aneurysm.  Prior to Admission medications   Medication Sig Start Date End Date Taking? Authorizing Provider  acetaminophen (TYLENOL) 650 MG CR tablet Take 1,300 mg by mouth every 8 (eight) hours as needed for pain.    [provider]  amiodarone (PACERONE) 200 MG tablet Take 1 tablet (200 mg total) by mouth daily. 10/29/22   Sharon Seller, NP  apixaban (ELIQUIS) 5 MG TABS tablet Take 1 tablet (5 mg total) by mouth 2 (two) times daily. 10/29/22   Sharon Seller, NP  ARIPiprazole (ABILIFY) 2 MG tablet Take 1 tablet (2 mg total) by mouth daily after breakfast. 12/01/22   Marcelino Duster, MD  Carboxymethylcellulose Sodium (THERATEARS OP) Place 1 drop into both eyes daily as needed (dry eyes).    [provider]  celecoxib (CELEBREX) 200 MG capsule Take 200 mg by mouth 2 (two) times daily. 11/02/22   [provider]  cyanocobalamin 1000 MCG tablet Take 1 tablet (1,000 mcg total) by mouth daily. Patient not taking: Reported on 11/20/2022 09/25/22   Loyce Dys, MD  docusate sodium (COLACE) 100 MG capsule Take 1 capsule (100 mg total) by mouth 2 (two) times daily. 11/30/22   Marcelino Duster, MD  DULoxetine (CYMBALTA) 60 MG capsule Take 1 tablet by mouth daily. 11/13/22   [provider]  furosemide (LASIX) 40 MG tablet Take 1 tablet (40 mg  total) by mouth daily. 10/29/22   Sharon Seller, NP  gabapentin (NEURONTIN) 100 MG capsule Take 2 capsules (200 mg total) by mouth 2 (two) times daily. 10/29/22   Sharon Seller, NP  levothyroxine (SYNTHROID) 50 MCG tablet Take 1 tablet (50 mcg total) by mouth daily at 6 (six) AM. 12/01/22   Marcelino Duster, MD  lovastatin (MEVACOR) 40 MG tablet Take 1 tablet (40 mg total) by mouth at bedtime. 10/29/22   Sharon Seller, NP  metoprolol succinate (TOPROL-XL) 50 MG 24 hr tablet Take 1 tablet (50 mg total) by mouth daily. Take with or immediately following a meal. 10/29/22   Sharon Seller, NP  NON FORMULARY CPAP 08/04/06   [provider]  nystatin cream (MYCOSTATIN) Apply 1 Application topically 2 (two) times daily as needed.    [provider]  omeprazole (PRILOSEC) 20 MG capsule TAKE 1 CAPSULE BY MOUTH EVERY DAY 10/29/22   Sharon Seller, NP  polyethylene glycol (MIRALAX / GLYCOLAX) 17 g packet Take 17 g by  mouth daily as needed.    [provider]  senna (SENOKOT) 8.6 MG tablet Take 2 tablets (17.2 mg total) by mouth at bedtime. 01/30/21   Jacky Kindle, FNP  traZODone (DESYREL) 100 MG tablet Take 1 tablet (100 mg total) by mouth daily. 10/29/22   Sharon Seller, NP    Physical Exam: Vitals:   02/17/23 1800 02/17/23 1805 02/17/23 1900 02/17/23 1924  BP: 129/66  132/72   Pulse: (!) 58  (!) 59   Resp: 19  17   Temp:    98.5 F (36.9 C)  TempSrc:    Oral  SpO2: 99% 100% 93%     Constitutional: NAD, calm, comfortable Vitals:   02/17/23 1800 02/17/23 1805 02/17/23 1900 02/17/23 1924  BP: 129/66  132/72   Pulse: (!) 58  (!) 59   Resp: 19  17   Temp:    98.5 F (36.9 C)  TempSrc:    Oral  SpO2: 99% 100% 93%    Eyes: PERRL, lids and conjunctivae normal ENMT: Mucous membranes are dry. Posterior pharynx clear of any exudate or lesions.Normal dentition.  Neck: normal, supple, no masses, no thyromegaly Respiratory: clear to auscultation  bilaterally anterior lung fields, no wheezing, no crackles. Normal respiratory effort. No accessory muscle use.  Cardiovascular: Irregularly irregular.  No pitting lower extremity edema.  No carotid bruits.  No murmurs rubs or gallops. Abdomen: no tenderness, no masses palpated. No hepatosplenomegaly. Bowel sounds positive.  Musculoskeletal: no clubbing / cyanosis. No joint deformity upper and lower extremities. Good ROM, no contractures. Normal muscle tone.  Skin: no rashes, lesions, ulcers. No induration Neurologic: CN 2-12 grossly intact. Sensation intact, DTR not tested. Strength 4/5 bilateral upper extremity strength.  3/5 right lower extremity strength.  3/5 left lower extremity strength.  Psychiatric: Normal judgment and insight. Alert and oriented x 3. Normal mood.   Labs on Admission: I have personally reviewed following labs and imaging studies  CBC: Recent Labs  Lab 02/17/23 1537  WBC 11.2*  HGB 13.3  HCT 39.3  MCV 97.8  PLT 176    Basic Metabolic Panel: Recent Labs  Lab 02/17/23 1537  NA 135  K 3.1*  CL 90*  CO2 33*  GLUCOSE 100*  BUN 27*  CREATININE 1.11*  CALCIUM 8.7*    GFR: CrCl cannot be calculated (Unknown ideal weight.).  Liver Function Tests: No results for input(s): "AST", "ALT", "ALKPHOS", "BILITOT", "PROT", "ALBUMIN" in the last 168 hours.  Urine analysis:    Component Value Date/Time   COLORURINE YELLOW (A) 02/17/2023 1833   APPEARANCEUR CLEAR (A) 02/17/2023 1833   APPEARANCEUR Hazy (A) 06/22/2022 1446   LABSPEC 1.008 02/17/2023 1833   LABSPEC 1.020 05/02/2012 1036   PHURINE 6.0 02/17/2023 1833   GLUCOSEU NEGATIVE 02/17/2023 1833   GLUCOSEU Negative 05/02/2012 1036   HGBUR Etienne (A) 02/17/2023 1833   BILIRUBINUR NEGATIVE 02/17/2023 1833   BILIRUBINUR Negative 06/22/2022 1446   BILIRUBINUR Negative 05/02/2012 1036   KETONESUR NEGATIVE 02/17/2023 1833   PROTEINUR NEGATIVE 02/17/2023 1833   UROBILINOGEN 0.2 08/22/2019 1605   NITRITE  NEGATIVE 02/17/2023 1833   LEUKOCYTESUR NEGATIVE 02/17/2023 1833   LEUKOCYTESUR 3+ 05/02/2012 1036    Radiological Exams on Admission: DG Chest Portable 1 View  Result Date: 02/17/2023 CLINICAL DATA:  Larey Seat getting out of a chair. Pain to lower back and both legs. Abrasion to left side of forehead. On Eliquis. EXAM: PORTABLE CHEST 1 VIEW COMPARISON:  09/16/2022 FINDINGS: Stable cardiomediastinal silhouette. Aortic atherosclerotic  calcification. No focal consolidation, pleural effusion, or pneumothorax. No displaced rib fractures. IMPRESSION: No active disease. Electronically Signed   By: Minerva Fester M.D.   On: 02/17/2023 19:19   CT HEAD WO CONTRAST  Result Date: 02/17/2023 CLINICAL DATA:  Provided history: Head trauma, minor. Neck pain, no cervical tenderness. Additional history provided: fall, low back pain, leg pain, forehead abrasion, on Eliquis. EXAM: CT HEAD WITHOUT CONTRAST CT CERVICAL SPINE WITHOUT CONTRAST TECHNIQUE: Multidetector CT imaging of the head and cervical spine was performed following the standard protocol without intravenous contrast. Multiplanar CT image reconstructions of the cervical spine were also generated. RADIATION DOSE REDUCTION: This exam was performed according to the departmental dose-optimization program which includes automated exposure control, adjustment of the mA and/or kV according to patient size and/or use of iterative reconstruction technique. COMPARISON:  CT head and CT cervical spine 01/11/2023. FINDINGS: CT HEAD FINDINGS Brain: No age advanced or lobar predominant parenchymal atrophy. Patchy and ill-defined hypoattenuation within the cerebral white matter, nonspecific but compatible with mild chronic [place]med[place] vessel ischemic disease. There is no acute intracranial hemorrhage. No demarcated cortical infarct. No extra-axial fluid collection. No evidence of an intracranial mass. No midline shift. Vascular: No hyperdense vessel.  Atherosclerotic calcifications.  Skull: No calvarial fracture or aggressive osseous lesion. Sinuses/Orbits: No acute finding within the orbits. Rhoads-volume frothy secretions within the bilateral sphenoid sinuses. Other: Ill-defined increased density within the left periorbital soft tissues, likely reflecting sequela of the hematoma and laceration which were present at this site on the prior head CT of 01/11/2023. CT CERVICAL SPINE FINDINGS Alignment: Mild levocurvature of the cervical spine. 2 mm C3-C4 grade 1 retrolisthesis. Slight C4-C5 grade 1 anterolisthesis. 2 mm C5-C6 grade 1 anterolisthesis. 2 mm C7-T1 grade 1 anterolisthesis. Skull base and vertebrae: The basion-dental and atlanto-dental intervals are maintained. Minimally displaced fracture through a C6-C7 ventral osteophyte, new from prior cervical spine CT of 01/11/2023 (for instance as seen on series 6, image 29) (series 7, images 62 and 63). No other cervical spine fracture identified elsewhere. Soft tissues and spinal canal: No prevertebral fluid or swelling. No visible canal hematoma. Disc levels: Cervical spondylosis with multilevel disc space narrowing, disc bulges/central disc protrusions, endplate spurring, uncovertebral hypertrophy and facet arthrosis. Disc space narrowing is moderate-to-advanced at C3-C4, C4-C5, C5-C6 and C6-C7. Multilevel spinal canal narrowing. Most notably at C3-C4, a partially calcified central disc protrusion contributes to suspected moderate spinal canal stenosis. Multilevel bony neural foraminal narrowing. Multilevel ventrolateral osteophytes, some bridging. Upper chest: No airspace consolidation within the imaged lung apices. No visible pneumothorax. IMPRESSION: CT head: 1. No evidence of an acute intracranial abnormality. 2. Mild chronic [place] vessel ischemic changes within the cerebral white matter. 3. Ill-defined increased density within the left periorbital soft tissues, likely reflecting sequela of the hematoma and laceration which were present at  this site on the prior examination of 01/11/2023. 4. Mild bilateral sphenoid sinusitis. CT cervical spine: 1. Minimally displaced fracture through a C6-C7 ventral osteophyte, new from the prior cervical spine CT of 01/11/2023 and likely acute. 2. Mild levocurvature of the cervical spine. 3. Mild grade 1 spondylolisthesis at C3-C4, C4-C5, C5-C6 and C7-T1. 4. Cervical spondylosis as described. Electronically Signed   By: Jackey Loge D.O.   On: 02/17/2023 16:47   CT Cervical Spine Wo Contrast  Result Date: 02/17/2023 CLINICAL DATA:  Provided history: Head trauma, minor. Neck pain, no cervical tenderness. Additional history provided: fall, low back pain, leg pain, forehead abrasion, on Eliquis. EXAM: CT HEAD WITHOUT CONTRAST  CT CERVICAL SPINE WITHOUT CONTRAST TECHNIQUE: Multidetector CT imaging of the head and cervical spine was performed following the standard protocol without intravenous contrast. Multiplanar CT image reconstructions of the cervical spine were also generated. RADIATION DOSE REDUCTION: This exam was performed according to the departmental dose-optimization program which includes automated exposure control, adjustment of the mA and/or kV according to patient size and/or use of iterative reconstruction technique. COMPARISON:  CT head and CT cervical spine 01/11/2023. FINDINGS: CT HEAD FINDINGS Brain: No age advanced or lobar predominant parenchymal atrophy. Patchy and ill-defined hypoattenuation within the cerebral white matter, nonspecific but compatible with mild chronic [place] vessel ischemic disease. There is no acute intracranial hemorrhage. No demarcated cortical infarct. No extra-axial fluid collection. No evidence of an intracranial mass. No midline shift. Vascular: No hyperdense vessel.  Atherosclerotic calcifications. Skull: No calvarial fracture or aggressive osseous lesion. Sinuses/Orbits: No acute finding within the orbits. Burridge-volume frothy secretions within the bilateral sphenoid  sinuses. Other: Ill-defined increased density within the left periorbital soft tissues, likely reflecting sequela of the hematoma and laceration which were present at this site on the prior head CT of 01/11/2023. CT CERVICAL SPINE FINDINGS Alignment: Mild levocurvature of the cervical spine. 2 mm C3-C4 grade 1 retrolisthesis. Slight C4-C5 grade 1 anterolisthesis. 2 mm C5-C6 grade 1 anterolisthesis. 2 mm C7-T1 grade 1 anterolisthesis. Skull base and vertebrae: The basion-dental and atlanto-dental intervals are maintained. Minimally displaced fracture through a C6-C7 ventral osteophyte, new from prior cervical spine CT of 01/11/2023 (for instance as seen on series 6, image 29) (series 7, images 62 and 63). No other cervical spine fracture identified elsewhere. Soft tissues and spinal canal: No prevertebral fluid or swelling. No visible canal hematoma. Disc levels: Cervical spondylosis with multilevel disc space narrowing, disc bulges/central disc protrusions, endplate spurring, uncovertebral hypertrophy and facet arthrosis. Disc space narrowing is moderate-to-advanced at C3-C4, C4-C5, C5-C6 and C6-C7. Multilevel spinal canal narrowing. Most notably at C3-C4, a partially calcified central disc protrusion contributes to suspected moderate spinal canal stenosis. Multilevel bony neural foraminal narrowing. Multilevel ventrolateral osteophytes, some bridging. Upper chest: No airspace consolidation within the imaged lung apices. No visible pneumothorax. IMPRESSION: CT head: 1. No evidence of an acute intracranial abnormality. 2. Mild chronic [place] vessel ischemic changes within the cerebral white matter. 3. Ill-defined increased density within the left periorbital soft tissues, likely reflecting sequela of the hematoma and laceration which were present at this site on the prior examination of 01/11/2023. 4. Mild bilateral sphenoid sinusitis. CT cervical spine: 1. Minimally displaced fracture through a C6-C7 ventral  osteophyte, new from the prior cervical spine CT of 01/11/2023 and likely acute. 2. Mild levocurvature of the cervical spine. 3. Mild grade 1 spondylolisthesis at C3-C4, C4-C5, C5-C6 and C7-T1. 4. Cervical spondylosis as described. Electronically Signed   By: Jackey Loge D.O.   On: 02/17/2023 16:47    EKG: Independently reviewed.   Assessment/Plan Principal Problem:   Generalized weakness Active Problems:   C6 cervical fracture (HCC)   Bilateral leg weakness   Falls frequently   Hypokalemia   Hypothyroidism   Anxiety disorder   Acid reflux   Obstructive apnea   Arthritis, degenerative   Depression, recurrent (HCC)   Chronic idiopathic constipation   Essential hypertension   Dyslipidemia   GERD without esophagitis   Muscle weakness (generalized)   Chronic atrial fibrillation (HCC)   Leukocytosis   #1 generalized weakness/inability to ambulate/frequent falls/C6-C7 ventral osteophyte fracture -Patient presented with generalized weakness after a fall with inability to ambulate.  Patient with history of frequent falls per husband that has been worsening recently over the past year. -Patient with no syncopal episode noted. -Patient with history of chronic A-fib. -Placed on telemetry. -CT head CT C-spine done with concerns for an acute C6-C7 ventral osteophyte fracture. -ED physician spoke with neurosurgery, Dr. Adriana Simas who recommended MRI of the C-spine which is currently pending. -MRI of the T and L-spine also ordered by EDP. -Patient with no signs or symptoms of infection. -PT/OT. -Pain management. -Per EDP, neurosurgery will formally consult on the patient in the AM.  2.  Hypokalemia -Check a magnesium level -K-Dur 40 mEq p.o. x 1.  3.  Hypothyroidism -Resume home regimen Synthroid.  4.  Chronic A-fib -Continue home regimen amiodarone and Toprol-XL for rate control. -Eliquis for anticoagulation. -Will need to reassess need for anticoagulation as patient with multiple  frequent falls recently.  5.  Hypertension -Resume home regimen Toprol-XL.  6.  Dehydration -IV fluids.  7.  GERD -PPI.  8.  Leukocytosis -Likely reactive leukocytosis. -Monitor for now.  9.  Depression/anxiety -Resume home regimen of Abilify, Cymbalta.  10.  Hyperlipidemia -Continue statin.  11.  History of chronic constipation -Resume home regimen Senokot-S.  MiraLAX as needed.  DVT prophylaxis: Eliquis Code Status:   DNR Family Communication:  Updated patient and husband at bedside. Disposition Plan:   Patient is from:  Home  Anticipated DC to:  TBD  Anticipated DC date:  2 to 3 days  Anticipated DC barriers: Clinical improvement Consults called:  Neurosurgery per ED physician spoke with Dr. Adriana Simas Admission status:  Place in observation  Severity of Illness: The appropriate patient status for this patient is OBSERVATION. Observation status is judged to be reasonable and necessary in order to provide the required intensity of service to ensure the patient's safety. The patient's presenting symptoms, physical exam findings, and initial radiographic and laboratory data in the context of their medical condition is felt to place them at decreased risk for further clinical deterioration. Furthermore, it is anticipated that the patient will be medically stable for discharge from the hospital within 2 midnights of admission.     Ramiro Harvest MD [facility]  How to contact the [facility] Attending or Consulting provider 7A - 7P or covering provider during after hours 7P -7A, for this patient?   Check the care team in [facility] and look for a) attending/consulting [facility] provider listed and b) the [facility] team listed Log into [email or web address] and use [facility] universal password to access. If you do not have the password, please contact the hospital operator. Locate the [facility] provider you are looking for under [facility] and page to a number that you can be directly reached. If  you still have difficulty reaching the provider, please page the [facility] (Director on Call) for the Hospitalists listed on amion for assistance.  02/17/2023, 9:42 PM

## 2023-02-17 NOTE — ED Triage Notes (Signed)
First Nurse Note;  Pt via [facility] from home. Pt mechanical fall in home, slid while walking to the bathroom. Pt c/o back pain and bilateral knee pain. Pt is A&Ox4 and NAD. L sided forehead bruising for a previous fall. 100% on RA  70 HR  137/70 BP

## 2023-02-17 NOTE — ED Notes (Signed)
Pt refused orthostatics as she said she cannot move too much.

## 2023-02-17 NOTE — ED Triage Notes (Signed)
Pt arrived via EMS from home. Pt states she was getting out of her chair to walk to her bedroom, all of a sudden she fell. Unsure if she passed out or tripped. Pt c/o pain to lower back and both legs. She also has an abrasion to the left side of her forehead. She is on eliquis, denies loc. PT is AxOx4.

## 2023-02-17 NOTE — Consult Note (Signed)
Neurosurgery-New Consultation Evaluation 02/17/2023 [name] [id number]  Identifying Statement: Robin Espinoza is a 77 y.o. female from [place] [address] with fall  Physician Requesting Consultation: [facility]  History of Present Illness: Robin Espinoza is here for evaluation after a fall and resulting neck pain.  She did have a CT scan of the neck which revealed an osteophyte fracture anteriorly without any other obvious injury.  She was placed in a cervical collar given the pain.  She does not endorse any obvious new extremity pain, numbness, or weakness, however she does comment on general fatigue and worsening strength overall.  She does find it difficult to function at home in terms of walking and changing positions.  She did go for MRI of the total spine  Past Medical History:  Past Medical History:  Diagnosis Date   Anxiety    Depression    Family history of adverse reaction to anesthesia    sister had a reaction when had a baby   GERD (gastroesophageal reflux disease)    Hyperlipidemia 12/21/2005   Hypertension    Hypothyroidism    Insomnia    OAB (overactive bladder)    Obstructive sleep apnea    Osteoarthrosis    Vitamin D deficiency     Social History: Social History   Socioeconomic History   Marital status: Married    Spouse name: Not on file   Number of children: 3   Years of education: Not on file   Highest education level: Master's degree (e.g., MA, MS, MEng, MEd, MSW, MBA)  Occupational History   Occupation: retired  Tobacco Use   Smoking status: Former   Smokeless tobacco: Never   Tobacco comments:    in college  Vaping Use   Vaping status: Never Used  Substance and Sexual Activity   Alcohol use: Yes    Comment: rarely 1 drink   Drug use: No   Sexual activity: Not on file  Other Topics Concern   Not on file  Social History Narrative   Not on file   Social Determinants of Health   Financial Resource Strain: Low Risk  (08/31/2022)    Received from [facility], [facility]   Overall Financial Resource Strain (CARDIA)    Difficulty of Paying Living Expenses: Not hard at all  Food Insecurity: No Food Insecurity (11/21/2022)   Hunger Vital Sign    Worried About Running Out of Food in the Last Year: Never true    Ran Out of Food in the Last Year: Never true  Transportation Needs: No Transportation Needs (11/21/2022)   PRAPARE - Administrator, Civil Service (Medical): No    Lack of Transportation (Non-Medical): No  Physical Activity: Inactive (04/04/2020)   Exercise Vital Sign    Days of Exercise per Week: 0 days    Minutes of Exercise per Session: 0 min  Stress: No Stress Concern Present (04/04/2020)   [employer] of Occupational Health - Occupational Stress Questionnaire    Feeling of Stress : Not at all  Social Connections: Socially Integrated (04/04/2020)   Social Connection and Isolation Panel [NHANES]    Frequency of Communication with Friends and Family: More than three times a week    Frequency of Social Gatherings with Friends and Family: More than three times a week    Attends Religious Services: More than 4 times per year    Active Member of Clubs or Organizations: Yes    Attends  Club or Organization Meetings: More than 4 times per year    Marital Status: Married  Catering manager Violence: Not At Risk (11/21/2022)   Humiliation, Afraid, Rape, and Kick questionnaire    Fear of Current or Ex-Partner: No    Emotionally Abused: No    Physically Abused: No    Sexually Abused: No    Family History: Family History  Problem Relation Age of Onset   Diabetes Father    Breast cancer Neg Hx     Review of Systems:  Review of Systems - General ROS: Negative Psychological ROS: Negative Ophthalmic ROS: Negative ENT ROS: Negative Hematological and Lymphatic ROS: Negative  Endocrine ROS: Negative Respiratory ROS: Negative Cardiovascular ROS:  Negative Gastrointestinal ROS: Negative Genito-Urinary ROS: Negative Musculoskeletal ROS: Positive for neck pain Neurological ROS: Negative for numbness or weakness focally Dermatological ROS: Negative  Physical Exam: BP 132/72   Pulse (!) 59   Temp 98.5 F (36.9 C) (Oral)   Resp 17   SpO2 93%  There is no height or weight on file to calculate BMI. There is no height or weight on file to calculate BSA. General appearance: Alert, cooperative, in no acute distress Head: Normocephalic, atraumatic Eyes: Normal, EOM intact Oropharynx: Moist without lesions Neck: In cervical collar Ext: No edema in extremities  Neurologic exam:  Mental status: alertness: alert, affect: normal Speech: fluent and clear Motor:strength symmetric 5/5 in bilateral upper extremities and bicep, tricep, grip, interossei with some delay in testing on the right side, likely due to pain.  At full effort, she is full strength on the right arm.  In bilateral lower extremities she has 5 out of 5 in knee flexion, dorsiflexion, plantarflexion. Sensory: intact to light touch in all extremities Gait: Not tested  Laboratory: Results for orders placed or performed during the hospital encounter of 02/17/23  Basic metabolic panel  Result Value Ref Range   Sodium 135 135 - 145 mmol/L   Potassium 3.1 (L) 3.5 - 5.1 mmol/L   Chloride 90 (L) 98 - 111 mmol/L   CO2 33 (H) 22 - 32 mmol/L   Glucose, Bld 100 (H) 70 - 99 mg/dL   BUN 27 (H) 8 - 23 mg/dL   Creatinine, Ser 8.29 (H) 0.44 - 1.00 mg/dL   Calcium 8.7 (L) 8.9 - 10.3 mg/dL   GFR, Estimated 51 (L) >60 mL/min   Anion gap 12 5 - 15  CBC  Result Value Ref Range   WBC 11.2 (H) 4.0 - 10.5 K/uL   RBC 4.02 3.87 - 5.11 MIL/uL   Hemoglobin 13.3 12.0 - 15.0 g/dL   HCT 56.2 13.0 - 86.5 %   MCV 97.8 80.0 - 100.0 fL   MCH 33.1 26.0 - 34.0 pg   MCHC 33.8 30.0 - 36.0 g/dL   RDW 78.4 69.6 - 29.5 %   Platelets 176 150 - 400 K/uL   nRBC 0.0 0.0 - 0.2 %  Urinalysis, Routine w  reflex microscopic -Urine, Clean Catch  Result Value Ref Range   Color, Urine YELLOW (A) YELLOW   APPearance CLEAR (A) CLEAR   Specific Gravity, Urine 1.008 1.005 - 1.030   pH 6.0 5.0 - 8.0   Glucose, UA NEGATIVE NEGATIVE mg/dL   Hgb urine dipstick Dettmann (A) NEGATIVE   Bilirubin Urine NEGATIVE NEGATIVE   Ketones, ur NEGATIVE NEGATIVE mg/dL   Protein, ur NEGATIVE NEGATIVE mg/dL   Nitrite NEGATIVE NEGATIVE   Leukocytes,Ua NEGATIVE NEGATIVE   RBC / HPF 0-5 0 - 5 RBC/hpf  WBC, UA 0-5 0 - 5 WBC/hpf   Bacteria, UA NONE SEEN NONE SEEN   Squamous Epithelial / HPF 0 0 - 5 /HPF   Mucus PRESENT   Troponin I (High Sensitivity)  Result Value Ref Range   Troponin I (High Sensitivity) 12 <18 ng/L   I personally reviewed labs  Imaging: CT cervical spine:1. Minimally displaced fracture through a C6-C7 ventral osteophyte, new from the prior cervical spine CT of 01/11/2023 and likely acute. 2. Mild levocurvature of the cervical spine. 3. Mild grade 1 spondylolisthesis at C3-C4, C4-C5, C5-C6 and C7-T1. 4. Cervical spondylosis   Impression/Plan:  Ms. Hoberman is here for evaluation of a Mcfarland osteophyte fracture.  This appears nonstructural in nature and she did have an MRI which shows no significant STIR signal in the ligaments and therefore I do not think any surgical invention is needed.  Given her pain, she can remain in the collar at this time and we will clear clinically in clinic.  She does have the MRI of the cervical, thoracic, lumbar spine performed.  She has multilevel stenosis throughout including in the cervical spine, multiple levels in the thoracic spine, and in the lumbar spine.  Her neurologic exam is reassuring at this time but she may have some myelopathic symptoms and therefore I do think she needs follow-up in the clinic to discuss treatment of her chronic issues.   1.  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   Plan Remain in cervical collar -Okay to work with physical therapy -Okay for DVT prophylaxis from neurosurgery perspective -Will need follow-up in clinic in the next 2 to 3 weeks to clear the collar discussed treatment of her chronic stenosis

## 2023-02-18 ENCOUNTER — Other Ambulatory Visit: Payer: Self-pay

## 2023-02-18 DIAGNOSIS — R531 Weakness: Secondary | ICD-10-CM | POA: Diagnosis not present

## 2023-02-18 LAB — CBC
HCT: 36.3 % (ref 36.0–46.0)
Hemoglobin: 11.8 g/dL — ABNORMAL LOW (ref 12.0–15.0)
MCH: 32.8 pg (ref 26.0–34.0)
MCHC: 32.5 g/dL (ref 30.0–36.0)
MCV: 100.8 fL — ABNORMAL HIGH (ref 80.0–100.0)
Platelets: 149 10*3/uL — ABNORMAL LOW (ref 150–400)
RBC: 3.6 MIL/uL — ABNORMAL LOW (ref 3.87–5.11)
RDW: 13.2 % (ref 11.5–15.5)
WBC: 9.4 10*3/uL (ref 4.0–10.5)
nRBC: 0 % (ref 0.0–0.2)

## 2023-02-18 LAB — COMPREHENSIVE METABOLIC PANEL
ALT: 9 U/L (ref 0–44)
AST: 20 U/L (ref 15–41)
Albumin: 3.3 g/dL — ABNORMAL LOW (ref 3.5–5.0)
Alkaline Phosphatase: 93 U/L (ref 38–126)
Anion gap: 14 (ref 5–15)
BUN: 25 mg/dL — ABNORMAL HIGH (ref 8–23)
CO2: 32 mmol/L (ref 22–32)
Calcium: 8.5 mg/dL — ABNORMAL LOW (ref 8.9–10.3)
Chloride: 92 mmol/L — ABNORMAL LOW (ref 98–111)
Creatinine, Ser: 0.97 mg/dL (ref 0.44–1.00)
GFR, Estimated: 60 mL/min — ABNORMAL LOW (ref >60)
Glucose, Bld: 95 mg/dL (ref 70–99)
Potassium: 3.2 mmol/L — ABNORMAL LOW (ref 3.5–5.1)
Sodium: 138 mmol/L (ref 135–145)
Total Bilirubin: 1 mg/dL (ref <1.2)
Total Protein: 6.1 g/dL — ABNORMAL LOW (ref 6.5–8.1)

## 2023-02-18 LAB — MAGNESIUM: Magnesium: 2 mg/dL (ref 1.7–2.4)

## 2023-02-18 MED ORDER — POLYVINYL ALCOHOL 1.4 % OP SOLN
1.0000 [drp] | OPHTHALMIC | Status: DC | PRN
  Administered 2023-02-24: 1 [drp] via OPHTHALMIC
  Filled 2023-02-18: qty 15

## 2023-02-18 MED ORDER — POTASSIUM CHLORIDE CRYS ER 20 MEQ PO TBCR
40.0000 meq | EXTENDED_RELEASE_TABLET | Freq: Once | ORAL | Status: AC
  Administered 2023-02-18: 40 meq via ORAL
  Filled 2023-02-18: qty 2

## 2023-02-18 NOTE — Progress Notes (Signed)
PT Cancellation Note  Patient Details Name: Robin Espinoza MRN: [id number] DOB: 21-Jun-1945   Cancelled Treatment:    Reason Eval/Treat Not Completed: Other (comment).  PT consult received.  Chart reviewed.  Pt declined physical therapy (pt stating she did not want to do therapy on her birthday/Thanksgiving).  Will re-attempt PT evaluation tomorrow.  Hendricks Limes, PT 02/18/23, 8:32 AM

## 2023-02-18 NOTE — Progress Notes (Signed)
  PROGRESS NOTE    Robin Espinoza  HQI:[id number] DOB: 1945-09-17 DOA: 02/17/2023 PCP: Lauro Regulus, MD  231A/231A-AA  LOS: 0 days   Brief hospital course:   Assessment & Plan: Robin Espinoza is a 77 y.o. female with medical history significant of osteoarthritis this, history of frequent falls uses a walker to ambulate, GERD, depression/anxiety, history of chronic A-fib on anticoagulation, hypertension, hypothyroidism, hyperlipidemia presenting to the ED with generalized weakness after a fall.   Patient also with some complaints of neck pain. CT head CT C-spine done concerning for a C6-C7 ventral osteophyte fracture, ED physician discussed with neurosurgery, Dr. Adriana Simas who recommended Aspen neck collar and MRI of the C-spine.    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 --PT/OT  2.  Hypokalemia --monitor and supplement PRN   3.  Hypothyroidism --cont home synthroid   4.  Chronic A-fib --movement artifacts sometimes causing the appearance of RVR, but pulse check confirmed rate controlled. --cont amiodarone and Toprol --cont Eliquis   5.  Hypertension --BP currently intermittently soft   6.  Dehydration --s/p IVF --d/c further IVF   7.  GERD -PPI.   8.  Leukocytosis -Likely reactive leukocytosis. --normalized without abx   9.  Depression/anxiety -cont home regimen of Abilify, Cymbalta.   10.  Hyperlipidemia -Continue statin.   11.  History of chronic constipation -cont home regimen Senokot-S.  MiraLAX as needed.   DVT prophylaxis: XL:[id number] Code Status: DNR  Family Communication:  Level of care: Telemetry Cardiac Dispo:   The patient is from:  home Anticipated d/c is to: to be determined Anticipated d/c date is: whenever disposition determined   Subjective and Interval History:  Pt reported feeling weak.     Objective: Vitals:   02/18/23 1041 02/18/23 1251 02/18/23 1300 02/18/23 1540  BP:  (!) 94/50 103/63 99/60  Pulse:  (!) 57 (!) 58 (!) 59  Resp:  18  16  Temp: 97.9 F (36.6 C) 97.9 F (36.6 C)  97.7 F (36.5 C)  TempSrc: Oral Oral    SpO2:  99%  99%  Height:   5\' 5"  (1.651 m)     Intake/Output Summary (Last 24 hours) at 02/18/2023 1553 Last data filed at 02/18/2023 1542 Gross per 24 hour  Intake 8 ml  Output 150 ml  Net -142 ml   There were no vitals filed for this visit.  Examination:   Constitutional: NAD, AAOx3 HEENT: conjunctivae and lids normal, EOMI CV: No cyanosis.   RESP: normal respiratory effort, on RA Neuro: II - XII grossly intact.   Psych: Normal mood and affect.  Appropriate judgement and reason   Data Reviewed: I have personally reviewed labs and imaging studies  Time spent: 50 minutes  Darlin Priestly, MD [facility] If 7PM-7AM, please contact night-coverage 02/18/2023, 3:53 PM

## 2023-02-18 NOTE — ED Notes (Signed)
Advised nurse that patient has ready bed 

## 2023-02-18 NOTE — ED Notes (Addendum)
Dr. Fran Lowes at bedside.MD stated that pt didn't need to wear neck brace unless needed for comfort.

## 2023-02-18 NOTE — ED Notes (Signed)
[place] collar removed while Dr. Fran Lowes is at bedside. Pt tolerated removal of collar well and verbalizes being more comfortable.

## 2023-02-19 DIAGNOSIS — R531 Weakness: Secondary | ICD-10-CM | POA: Diagnosis not present

## 2023-02-19 LAB — BASIC METABOLIC PANEL
Anion gap: 10 (ref 5–15)
BUN: 23 mg/dL (ref 8–23)
CO2: 32 mmol/L (ref 22–32)
Calcium: 8.2 mg/dL — ABNORMAL LOW (ref 8.9–10.3)
Chloride: 93 mmol/L — ABNORMAL LOW (ref 98–111)
Creatinine, Ser: 1.09 mg/dL — ABNORMAL HIGH (ref 0.44–1.00)
GFR, Estimated: 52 mL/min — ABNORMAL LOW (ref >60)
Glucose, Bld: 98 mg/dL (ref 70–99)
Potassium: 3.3 mmol/L — ABNORMAL LOW (ref 3.5–5.1)
Sodium: 135 mmol/L (ref 135–145)

## 2023-02-19 LAB — CBC
HCT: 33 % — ABNORMAL LOW (ref 36.0–46.0)
Hemoglobin: 11.2 g/dL — ABNORMAL LOW (ref 12.0–15.0)
MCH: 32.9 pg (ref 26.0–34.0)
MCHC: 33.9 g/dL (ref 30.0–36.0)
MCV: 97.1 fL (ref 80.0–100.0)
Platelets: 144 10*3/uL — ABNORMAL LOW (ref 150–400)
RBC: 3.4 MIL/uL — ABNORMAL LOW (ref 3.87–5.11)
RDW: 13.1 % (ref 11.5–15.5)
WBC: 6.8 10*3/uL (ref 4.0–10.5)
nRBC: 0 % (ref 0.0–0.2)

## 2023-02-19 LAB — MAGNESIUM: Magnesium: 2.1 mg/dL (ref 1.7–2.4)

## 2023-02-19 MED ORDER — POLYETHYLENE GLYCOL 3350 17 G PO PACK: 34.0000 g | PACK | ORAL | Status: DC

## 2023-02-19 MED ORDER — POTASSIUM CHLORIDE CRYS ER 20 MEQ PO TBCR
40.0000 meq | EXTENDED_RELEASE_TABLET | Freq: Once | ORAL | Status: AC
  Administered 2023-02-19: 40 meq via ORAL
  Filled 2023-02-19: qty 2

## 2023-02-19 NOTE — TOC Initial Note (Addendum)
Transition of Care [facility]) - Initial/Assessment Note    Patient Details  Name: Robin Espinoza MRN: [id number] Date of Birth: 08/07/1945  Transition of Care [facility]) CM/SW Contact:    Truddie Hidden, RN Phone Number: 02/19/2023, 10:26 AM  Clinical Narrative:                 TOC continuing to follow patient's progress throughout discharge planning.  1:30pm Attempt to speak with patient regarding therapy's recommendation for SNF. She requested a follow up tomorrow. "Now's not a good time. "   Per Morrie Sheldon with [facility], patient is active for [facility] RN/ PT.         Patient Goals and CMS Choice            Expected Discharge Plan and Services                                              Prior Living Arrangements/Services                       Activities of Daily Living   ADL Screening (condition at time of admission) Independently performs ADLs?: No Does the patient have a NEW difficulty with bathing/dressing/toileting/self-feeding that is expected to last >3 days?: Yes (Initiates electronic notice to provider for possible OT consult) Does the patient have a NEW difficulty with getting in/out of bed, walking, or climbing stairs that is expected to last >3 days?: Yes (Initiates electronic notice to provider for possible PT consult) Does the patient have a NEW difficulty with communication that is expected to last >3 days?: No Is the patient deaf or have difficulty hearing?: No Does the patient have difficulty seeing, even when wearing glasses/contacts?: No Does the patient have difficulty concentrating, remembering, or making decisions?: Yes  Permission Sought/Granted                  Emotional Assessment              Admission diagnosis:  Generalized weakness [R53.1] Fall, initial encounter [W19.XXXA] Closed nondisplaced fracture of sixth cervical vertebra, unspecified fracture morphology, initial encounter (HCC) [S12.501A] Patient Active Problem  List   Diagnosis Date Noted   Leukocytosis 02/17/2023   C6 cervical fracture (HCC) 02/17/2023   Altered mental status 12/29/2022   Major depressive disorder, recurrent episode, moderate (HCC) 11/24/2022   Hypotension 11/21/2022   Falls frequently 11/21/2022   Bilateral leg weakness 11/20/2022   Chronic atrial fibrillation (HCC) 11/13/2022   Muscle weakness (generalized) 09/23/2022   Bilateral leg edema 09/19/2022   Atrial flutter (HCC) 09/19/2022   Atrial flutter with rapid ventricular response (HCC) 09/18/2022   Acute heart failure with preserved ejection fraction (HFpEF) (HCC) 09/18/2022   Atrial fibrillation (HCC) 09/16/2022   Elevated brain natriuretic peptide (BNP) level 09/16/2022   Hypokalemia 09/16/2022   Essential hypertension 09/16/2022   Dyslipidemia 09/16/2022   GERD without esophagitis 09/16/2022   Generalized weakness 01/30/2021   Depression, recurrent (HCC) 01/30/2021   Chronic idiopathic constipation 01/30/2021   Need for shingles vaccine 01/30/2021   Hypothyroidism 11/13/2014   Anxiety disorder 10/15/2014   Insomnia 09/21/2014   Acid reflux 11/30/2007   Arthritis, degenerative 09/16/2007   Obstructive apnea 05/06/2006   PCP:  Lauro Regulus, MD Pharmacy:   [phone] - [place], [place] - [address] [place]  [place] [address] Phone: [phone] Fax: [phone]  [id number] [place] [facility] [place] - [address] [place] [address] Phone: [phone] Fax: [phone]     Social Determinants of Health (SDOH) Social History: SDOH Screenings   Food Insecurity: No Food Insecurity (02/18/2023)  Housing: Low Risk  (02/18/2023)  Transportation Needs: No Transportation Needs (02/18/2023)  Utilities: Not At Risk (02/18/2023)  Alcohol Screen: Low Risk  (09/12/2021)  Depression (PHQ2-9): Medium Risk (09/12/2021)  Financial Resource Strain: Low Risk  (08/31/2022)   Received from [facility]  Physical Activity: Inactive (04/04/2020)  Social Connections: Socially Integrated (04/04/2020)  Stress: No Stress Concern Present (04/04/2020)  Tobacco Use: Medium Risk (02/17/2023)   SDOH Interventions:     Readmission Risk Interventions     No data to display

## 2023-02-19 NOTE — Evaluation (Signed)
Physical Therapy Evaluation Patient Details Name: Robin Espinoza MRN: [id number] DOB: Jun 03, 1945 Today's Date: 02/19/2023  History of Present Illness  Pt is a 77 y.o. female presenting to hospital 02/17/23 with c/o fall (legs gave out); c/o neck pain.  Imaging showing "Minimally displaced fracture through a C6-C7 ventral osteophyte, new from the prior cervical spine CT of 01/11/2023 and likely acute".  Pt admitted with generalized weakness, inability to ambulate, frequent falls, C6-7 osteophyte fx, hypokalemia.Marland Kitchen  PMH includes OA, h/o frequent falls, chronic a-fib, htn, HLD, OSA, R TKA.  H/o falls.  Clinical Impression  PT/OT co-evaluation performed.  Prior to recent medical concerns, pt was modified independent ambulating with rollator; lives with her husband in 1 level home (level entry).  Currently pt is max assist semi-supine to sitting EOB via logrolling; min assist x2 to stand from elevated bed height; and CGA x2 to take steps bed to recliner with RW use.  Increased effort/time required for pt to take steps.  Pt appearing anxious with activities (fearful of falling).  Pt would currently benefit from skilled PT to address noted impairments and functional limitations (see below for any additional details).  Upon hospital discharge, pt would benefit from ongoing therapy.     If plan is discharge home, recommend the following: Two people to help with walking and/or transfers;A lot of help with bathing/dressing/bathroom;Assistance with cooking/housework;Assist for transportation;Help with stairs or ramp for entrance   Can travel by private vehicle   No    Equipment Recommendations Other (comment) (TBD at next facility)  Recommendations for Other Services       Functional Status Assessment Patient has had a recent decline in their functional status and demonstrates the ability to make significant improvements in function in a reasonable and predictable amount of time.     Precautions /  Restrictions Precautions Precautions: Fall Required Braces or Orthoses: Cervical Brace Cervical Brace: Hard collar;At all times Restrictions Weight Bearing Restrictions: No      Mobility  Bed Mobility Overal bed mobility: Needs Assistance Bed Mobility: Supine to Sit     Supine to sit: Max assist, HOB elevated, Used rails     General bed mobility comments: via logrolling; vc's for technique    Transfers Overall transfer level: Needs assistance Equipment used: Rolling walker (2 wheels) Transfers: Sit to/from Stand, Bed to chair/wheelchair/BSC Sit to Stand: Min assist, From elevated surface, +2 physical assistance   Step pivot transfers: Contact guard assist, +2 safety/equipment, From elevated surface       General transfer comment: increased time to take steps with RW (vc's to stay on task); vc's for technique    Ambulation/Gait               General Gait Details: Deferred (pt requiring increased time/effort to take steps bed to recliner)  Stairs            Wheelchair Mobility     Tilt Bed    Modified Rankin (Stroke Patients Only)       Balance Overall balance assessment: Needs assistance Sitting-balance support: Feet supported, Bilateral upper extremity supported Sitting balance-Leahy Scale: Good     Standing balance support: During functional activity, Reliant on assistive device for balance, Bilateral upper extremity supported Standing balance-Leahy Scale: Fair Standing balance comment: CGA x2 for safety (with B UE support on RW)                             Pertinent  Vitals/Pain Pain Assessment Pain Assessment: 0-10 Pain Score: 2  Pain Location: R shoulder Pain Descriptors / Indicators: Aching Pain Intervention(s): Limited activity within patient's tolerance, Monitored during session (nurse notified)    Home Living Family/patient expects to be discharged to:: Private residence Living Arrangements: Spouse/significant  other Available Help at Discharge: Family Type of Home: House Home Access: Level entry       Home Layout: One level Home Equipment: Agricultural consultant (2 wheels);Rollator (4 wheels);Shower seat;BSC/3in1;Lift chair      Prior Function Prior Level of Function : History of Falls (last six months);Independent/Modified Independent             Mobility Comments: Modified independent ambulating with rollator; h/o multiples falls; sleeps in lift recliner ADLs Comments: Incontinent (wears pull ups); takes sponge baths; sister assists whenever needed and pt able to dress and go to bathroom on her own at times; she has an aide coming 2 days per week to bathe her     Extremity/Trunk Assessment   Upper Extremity Assessment Upper Extremity Assessment: Generalized weakness    Lower Extremity Assessment Lower Extremity Assessment: Generalized weakness    Cervical / Trunk Assessment Cervical / Trunk Assessment: Other exceptions Cervical / Trunk Exceptions: forward head and neck with c-collar on  Communication   Communication Communication: No apparent difficulties Cueing Techniques: Verbal cues;Visual cues  Cognition Arousal: Alert Behavior During Therapy: WFL for tasks assessed/performed, Anxious Overall Cognitive Status: Within Functional Limits for tasks assessed                                          General Comments General comments (skin integrity, edema, etc.): HR and SpO2 sats on room air WFL during session.  Per discussion with MD Adriana Simas (Neurosurgery) this morning, pt is to wear cervical hard collar at all times (MD Fran Lowes, pt's nurse, and OT updated; pt also educated regarding this).    Exercises     Assessment/Plan    PT Assessment Patient needs continued PT services  PT Problem List Decreased strength;Decreased activity tolerance;Decreased balance;Decreased mobility;Decreased knowledge of use of DME;Decreased knowledge of precautions;Pain       PT  Treatment Interventions DME instruction;Gait training;Functional mobility training;Therapeutic activities;Therapeutic exercise;Balance training;Patient/family education    PT Goals (Current goals can be found in the Care Plan section)  Acute Rehab PT Goals Patient Stated Goal: to improve strength and walking PT Goal Formulation: With patient Time For Goal Achievement: 03/05/23 Potential to Achieve Goals: Good    Frequency Min 1X/week     Co-evaluation PT/OT/SLP Co-Evaluation/Treatment: Yes Reason for Co-Treatment: For patient/therapist safety PT goals addressed during session: Mobility/safety with mobility OT goals addressed during session: ADL's and self-care       AM-PAC PT "6 Clicks" Mobility  Outcome Measure Help needed turning from your back to your side while in a flat bed without using bedrails?: A Little Help needed moving from lying on your back to sitting on the side of a flat bed without using bedrails?: A Lot Help needed moving to and from a bed to a chair (including a wheelchair)?: A Lot Help needed standing up from a chair using your arms (e.g., wheelchair or bedside chair)?: A Lot Help needed to walk in hospital room?: Total Help needed climbing 3-5 steps with a railing? : Total 6 Click Score: 11    End of Session Equipment Utilized During Treatment: Gait belt;Cervical collar  Activity Tolerance: Patient tolerated treatment well Patient left: in chair;with call bell/phone within reach;with chair alarm set Nurse Communication: Mobility status;Precautions;Other (comment) (Pt's R shoulder pain) PT Visit Diagnosis: Other abnormalities of gait and mobility (R26.89);Muscle weakness (generalized) (M62.81);History of falling (Z91.81);Pain Pain - Right/Left: Right Pain - part of body: Shoulder    Time: 8295-6213 PT Time Calculation (min) (ACUTE ONLY): 27 min   Charges:   PT Evaluation $PT Eval Low Complexity: 1 Low PT Treatments $Therapeutic Activity: 8-22  mins PT General Charges $$ ACUTE PT VISIT: 1 Visit        Hendricks Limes, PT 02/19/23, 1:41 PM

## 2023-02-19 NOTE — Progress Notes (Signed)
  PROGRESS NOTE    Robin Espinoza  VWU:[id number] DOB: 1945-09-01 DOA: 02/17/2023 PCP: Lauro Regulus, MD  231A/231A-AA  LOS: 0 days   Brief hospital course:   Assessment & Plan: Robin Espinoza is a 77 y.o. female with medical history significant of osteoarthritis this, history of frequent falls uses a walker to ambulate, GERD, depression/anxiety, history of chronic A-fib on anticoagulation, hypertension, hypothyroidism, hyperlipidemia presenting to the ED with generalized weakness after a fall.   Patient also with some complaints of neck pain. CT head CT C-spine done concerning for a C6-C7 ventral osteophyte fracture, ED physician discussed with neurosurgery, Dr. Adriana Simas who recommended Aspen neck collar and MRI of the C-spine.    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 --Will need follow-up in clinic in the next 2 to 3 weeks to clear the collar discussed treatment of her chronic stenosis  --ok to work with PT  Weakness --pt admitted to not wanting to move and getting weaker at home. --PT/OT rec SNF rehab  2.  Hypokalemia --monitor and supplement PRN   3.  Hypothyroidism --cont home synthroid   4.  Chronic A-fib --movement artifacts sometimes causing the appearance of RVR, but pulse check confirmed rate controlled. --cont amiodarone and Toprol --cont Eliquis   5.  Hypertension --BP currently intermittently soft   6.  Dehydration --s/p IVF --oral hydration now   7.  GERD -PPI.   8.  Leukocytosis -Likely reactive leukocytosis. --normalized without abx   9.  Depression/anxiety -cont home regimen of Abilify, Cymbalta.   10.  Hyperlipidemia -Continue statin.   11.  History of chronic constipation -cont home regimen Senokot-S.  MiraLAX as needed.   DVT prophylaxis: GN:FAOZHYQ Code Status: DNR  Family Communication:  Level of care:  Telemetry Cardiac Dispo:   The patient is from: home Anticipated d/c is to: SNF rehab Anticipated d/c date is: whenever bed available   Subjective and Interval History:  Pt said she couldn't tolerate the cervical collar.  Reported no neck pain now.    Working on [employer] today.   Objective: Vitals:   02/19/23 0515 02/19/23 0748 02/19/23 1137 02/19/23 1548  BP: 114/60 107/83 138/74 127/74  Pulse: (!) 58 60 65 (!) 56  Resp: 18     Temp: 98 F (36.7 C) 98.3 F (36.8 C) 98 F (36.7 C) 98.8 F (37.1 C)  TempSrc: Oral     SpO2: 93% 95% 100% 100%  Weight: 103.2 kg     Height:        Intake/Output Summary (Last 24 hours) at 02/19/2023 1910 Last data filed at 02/19/2023 0532 Gross per 24 hour  Intake --  Output 250 ml  Net -250 ml   Filed Weights   02/19/23 0515  Weight: 103.2 kg    Examination:   Constitutional: NAD, AAOx3 HEENT: conjunctivae and lids normal, EOMI CV: No cyanosis.   RESP: normal respiratory effort, on RA Neuro: II - XII grossly intact.   Psych: Normal mood and affect.  Appropriate judgement and reason   Data Reviewed: I have personally reviewed labs and imaging studies  Time spent: 35 minutes  Darlin Priestly, MD [facility] If 7PM-7AM, please contact night-coverage 02/19/2023, 7:10 PM

## 2023-02-19 NOTE — Evaluation (Signed)
Occupational Therapy Evaluation Patient Details Name: Robin Espinoza MRN: [id number] DOB: May 08, 1945 Today's Date: 02/19/2023   History of Present Illness 77 y.o. female presenting to the ED with generalized weakness after a fall.  CT C-spine done concerning for a C6-C7 ventral osteophyte fracture acute-to wear aspen neck collar. MRI c spine:  severe bil C4 foraminal stenosis, severe right with mod left C5 foraminal narrowing, mod-severe bil C6 foraminal  stenosis, with mod bil C7 foraminal narrowing. PMHx: osteoarthritis, history of frequent falls uses a walker to ambulate, GERD, depression/anxiety, chronic A-fib, HTN, hypothyroidism, HLD   Clinical Impression   Pt was seen for PT/OT evaluation this date to maximize pt/therapist safety d/t mult fall history. Prior to hospital admission, pt lives with her husband in a 1 level town-home with level entry. Reports use of rollator for all mobility with multiple falls. IND/MOD I with ADL performance some of the time, but also has sister who can assist as needed and an aide who comes 2x/wk to bathe her. Husband manages IADLs and meds.  Pt presents to acute OT demonstrating impaired ADL performance and functional mobility 2/2 weakness, balance deficits, pain, and limited activity tolerance (See OT problem list for additional functional deficits). Pt currently requires Max A for bed mobility. Min A x2 for STS from elevated surface and cueing for hand/feet placement. CGA x2 and extra time required for SPT bed to recliner with pillows under for elevated height with cueing to stay on task. C-collar donned in bed prior to movement with edu provided on pt to wear at all times as directed by neurologist. Pt left seated in recliner with c-collar on, all needs in place and nurse notified of status. Reported 2/10 pain to R  shoulder intermittently during session.  Pt would benefit from skilled OT services to address noted impairments and functional limitations (see below  for any additional details) in order to maximize safety and independence while minimizing falls risk and caregiver burden. Do anticipate the need for follow up OT services upon acute hospital DC.        If plan is discharge home, recommend the following: A little help with walking and/or transfers;A lot of help with bathing/dressing/bathroom;Help with stairs or ramp for entrance;Assist for transportation;Assistance with cooking/housework    Functional Status Assessment  Patient has had a recent decline in their functional status and demonstrates the ability to make significant improvements in function in a reasonable and predictable amount of time.  Equipment Recommendations  Other (comment) (defer)    Recommendations for Other Services       Precautions / Restrictions Precautions Precautions: Fall Required Braces or Orthoses: Cervical Brace Cervical Brace: Hard collar;At all times Restrictions Weight Bearing Restrictions: No      Mobility Bed Mobility Overal bed mobility: Needs Assistance Bed Mobility: Supine to Sit     Supine to sit: Max assist, HOB elevated, Used rails          Transfers Overall transfer level: Needs assistance Equipment used: Rolling walker (2 wheels) Transfers: Sit to/from Stand, Bed to chair/wheelchair/BSC Sit to Stand: Min assist, From elevated surface, +2 physical assistance     Step pivot transfers: Contact guard assist, +2 safety/equipment, From elevated surface     General transfer comment: increased time for SPT with cueing for staying on task      Balance Overall balance assessment: Needs assistance Sitting-balance support: Feet supported, Bilateral upper extremity supported Sitting balance-Leahy Scale: Good     Standing balance support: During functional activity,  Reliant on assistive device for balance, Bilateral upper extremity supported Standing balance-Leahy Scale: Fair Standing balance comment: no LOB noted, CGA x2 and use  of RW for SPT                           ADL either performed or assessed with clinical judgement   ADL Overall ADL's : Needs assistance/impaired     Grooming: Minimal assistance Grooming Details (indicate cue type and reason): Min A to take top off toothpaste and set up of all items in front of her                 Toilet Transfer: Contact guard assist;+2 for physical assistance;Rolling walker (2 wheels) Toilet Transfer Details (indicate cue type and reason): simulated         Functional mobility during ADLs: Contact guard assist;Cueing for safety;Rolling walker (2 wheels);+2 for safety/equipment General ADL Comments: CGA x2 for SPT; Min A x2 for STS with bed elevated and Max A for supine to sit at [employer]         Perception         Praxis         Pertinent Vitals/Pain Pain Assessment Pain Assessment: 0-10 Pain Score: 2  Pain Location: R shoulder Pain Descriptors / Indicators: Aching Pain Intervention(s): Monitored during session, Limited activity within patient's tolerance     Extremity/Trunk Assessment Upper Extremity Assessment Upper Extremity Assessment: Generalized weakness   Lower Extremity Assessment Lower Extremity Assessment: Generalized weakness   Cervical / Trunk Assessment Cervical / Trunk Assessment:  (forward neck and shoulders with c collaron)   Communication Communication Communication: No apparent difficulties Cueing Techniques: Verbal cues   Cognition Arousal: Alert Behavior During Therapy: WFL for tasks assessed/performed Overall Cognitive Status: Within Functional Limits for tasks assessed                                       General Comments  HR WFL throughout session    Exercises Other Exercises Other Exercises: Edu in role of OT in acute setting and importance of therapy to maximize strength and safety to return to PLOF.   Shoulder Instructions      Home Living Family/patient expects  to be discharged to:: Private residence Living Arrangements: Spouse/significant other Available Help at Discharge: Family Type of Home: House Home Access: Level entry     Home Layout: One level         Bathroom Toilet: Handicapped height     Home Equipment: Agricultural consultant (2 wheels);Rollator (4 wheels);Shower seat;BSC/3in1;Lift chair          Prior Functioning/Environment Prior Level of Function : History of Falls (last six months);Independent/Modified Independent             Mobility Comments: used rollator for mobility, reports mult falls, sleeps in lift recliner ADLs Comments: incontinent, wears pull ups, takes sponge baths; sister assists whenever needed and pt able to dress and go to bathroom on her own at times; she has an aide coming 2 days per week to bathe her;        OT Problem List: Decreased strength;Decreased knowledge of precautions;Decreased activity tolerance;Impaired balance (sitting and/or standing)      OT Treatment/Interventions: Self-care/ADL training;Therapeutic exercise;Patient/family education;Balance training;Therapeutic activities;DME and/or AE instruction    OT Goals(Current goals can be found in the  care plan section) Acute Rehab OT Goals Patient Stated Goal: improve strength and ability to move OT Goal Formulation: With patient Time For Goal Achievement: 03/05/23 Potential to Achieve Goals: Good ADL Goals Pt Will Perform Grooming: standing;with contact guard assist Pt Will Perform Lower Body Bathing: with contact guard assist;sitting/lateral leans;sit to/from stand Pt Will Perform Lower Body Dressing: sit to/from stand;sitting/lateral leans;with contact guard assist;with adaptive equipment Pt Will Transfer to Toilet: with supervision;ambulating;grab bars;bedside commode;regular height toilet Pt Will Perform Toileting - Clothing Manipulation and hygiene: with contact guard assist;sit to/from stand;sitting/lateral leans Additional ADL Goal  #1: Pt will perform bed mobility with SUP and good safety to promote IND and return to PLOF.  OT Frequency: Min 1X/week    Co-evaluation PT/OT/SLP Co-Evaluation/Treatment: Yes Reason for Co-Treatment: For patient/therapist safety PT goals addressed during session: Mobility/safety with mobility OT goals addressed during session: ADL's and self-care      AM-PAC OT "6 Clicks" Daily Activity     Outcome Measure Help from another person eating meals?: None Help from another person taking care of personal grooming?: A Little Help from another person toileting, which includes using toliet, bedpan, or urinal?: A Little Help from another person bathing (including washing, rinsing, drying)?: A Lot Help from another person to put on and taking off regular upper body clothing?: A Little Help from another person to put on and taking off regular lower body clothing?: A Lot 6 Click Score: 17   End of Session Equipment Utilized During Treatment: Rolling walker (2 wheels) Nurse Communication: Mobility status  Activity Tolerance: Patient tolerated treatment well Patient left: in chair;with call bell/phone within reach;with chair alarm set  OT Visit Diagnosis: Other abnormalities of gait and mobility (R26.89);Muscle weakness (generalized) (M62.81);Repeated falls (R29.6)                Time: 6578-4696 OT Time Calculation (min): 26 min Charges:  OT General Charges $OT Visit: 1 Visit OT Evaluation $OT Eval Moderate Complexity: 1 Mod Arleen Bar, OTR/L 02/19/23, 9:47 AM  Constance Goltz 02/19/2023, 9:44 AM

## 2023-02-19 NOTE — Plan of Care (Signed)

## 2023-02-20 ENCOUNTER — Observation Stay: Payer: PPO

## 2023-02-20 DIAGNOSIS — M5021 Other cervical disc displacement,  high cervical region: Secondary | ICD-10-CM | POA: Diagnosis not present

## 2023-02-20 DIAGNOSIS — R531 Weakness: Secondary | ICD-10-CM | POA: Diagnosis not present

## 2023-02-20 DIAGNOSIS — S12200A Unspecified displaced fracture of third cervical vertebra, initial encounter for closed fracture: Secondary | ICD-10-CM | POA: Diagnosis not present

## 2023-02-20 DIAGNOSIS — S12300A Unspecified displaced fracture of fourth cervical vertebra, initial encounter for closed fracture: Secondary | ICD-10-CM | POA: Diagnosis not present

## 2023-02-20 DIAGNOSIS — S12500A Unspecified displaced fracture of sixth cervical vertebra, initial encounter for closed fracture: Secondary | ICD-10-CM | POA: Diagnosis not present

## 2023-02-20 LAB — BASIC METABOLIC PANEL
Anion gap: 10 (ref 5–15)
BUN: 17 mg/dL (ref 8–23)
CO2: 29 mmol/L (ref 22–32)
Calcium: 8 mg/dL — ABNORMAL LOW (ref 8.9–10.3)
Chloride: 95 mmol/L — ABNORMAL LOW (ref 98–111)
Creatinine, Ser: 0.76 mg/dL (ref 0.44–1.00)
GFR, Estimated: >60 mL/min (ref >60)
Glucose, Bld: 107 mg/dL — ABNORMAL HIGH (ref 70–99)
Potassium: 3.2 mmol/L — ABNORMAL LOW (ref 3.5–5.1)
Sodium: 134 mmol/L — ABNORMAL LOW (ref 135–145)

## 2023-02-20 LAB — CBC
HCT: 33.4 % — ABNORMAL LOW (ref 36.0–46.0)
Hemoglobin: 11.1 g/dL — ABNORMAL LOW (ref 12.0–15.0)
MCH: 33.1 pg (ref 26.0–34.0)
MCHC: 33.2 g/dL (ref 30.0–36.0)
MCV: 99.7 fL (ref 80.0–100.0)
Platelets: 152 10*3/uL (ref 150–400)
RBC: 3.35 MIL/uL — ABNORMAL LOW (ref 3.87–5.11)
RDW: 12.8 % (ref 11.5–15.5)
WBC: 6.7 10*3/uL (ref 4.0–10.5)
nRBC: 0 % (ref 0.0–0.2)

## 2023-02-20 LAB — MAGNESIUM: Magnesium: 2.5 mg/dL — ABNORMAL HIGH (ref 1.7–2.4)

## 2023-02-20 LAB — GLUCOSE, CAPILLARY
Glucose-Capillary: 97 mg/dL (ref 70–99)
Glucose-Capillary: 85 mg/dL (ref 70–99)

## 2023-02-20 MED ORDER — POTASSIUM CHLORIDE CRYS ER 20 MEQ PO TBCR
40.0000 meq | EXTENDED_RELEASE_TABLET | Freq: Every day | ORAL | Status: DC
  Administered 2023-02-20 – 2023-02-24 (×5): 40 meq via ORAL
  Filled 2023-02-20 (×5): qty 2

## 2023-02-20 NOTE — Progress Notes (Signed)
PROGRESS NOTE    Robin Espinoza  YNW:[id number] DOB: Sep 05, 1945 DOA: 02/17/2023 PCP: Lauro Regulus, MD  231A/231A-AA  LOS: 0 days   Brief hospital course:   Assessment & Plan: Robin Espinoza is a 77 y.o. female with medical history significant of osteoarthritis, frequent falls uses a walker to ambulate, chronic A-fib on anticoagulation, hypertension, hypothyroidism, presenting to the ED with generalized weakness after a fall.   Patient also with some complaints of neck pain. CT head CT C-spine done concerning for a C6-C7 ventral osteophyte fracture, ED physician discussed with neurosurgery, Dr. Adriana Simas who recommended Aspen neck collar and MRI of the C-spine.    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 --Will need follow-up in clinic in the next 2 to 3 weeks to discuss treatment of her chronic stenosis  --avoid any falls or other extraneous movements of the neck   Weakness --pt admitted to not wanting to move and getting weaker at home. --PT/OT and SNF rehab  2.  Hypokalemia --potassium level persistently low despite daily supplementation. --schedule potassium 40 mEq daily for now --monitor   3.  Hypothyroidism --cont home synthroid   4.  Chronic A-fib --movement artifacts sometimes causing the appearance of RVR, but pulse check confirmed rate controlled. --cont amiodarone and Toprol --cont Eliquis   5.  Hypertension --cont Toprol   6.  Dehydration --s/p IVF --oral hydration now   7.  GERD -PPI.   8.  Leukocytosis -Likely reactive leukocytosis. --normalized without abx   9.  Depression/anxiety -cont home regimen of Abilify, Cymbalta.   10.  Hyperlipidemia -Continue statin.   11.  History of chronic constipation -cont home regimen Senokot-S.  MiraLAX as needed.   DVT prophylaxis: MV:HQIONGE Code Status: DNR  Family Communication:  daughter updated at bedside today Level of care: Telemetry Cardiac Dispo:   The patient is from: home Anticipated d/c is to: SNF rehab Anticipated d/c date is: whenever bed available   Subjective and Interval History:  Pt reported more leg swelling today.  Pt refused to wear cervical collar.  Neurosurgery rec xray which did not show any significant abnormal motion at the C6-7 level (where the concern for the osteophyte fracture), therefore, pt can wear the collar for comfort.   Objective: Vitals:   02/20/23 0315 02/20/23 0725 02/20/23 1145 02/20/23 1529  BP: 134/81 (!) 151/83 (!) 148/88 132/73  Pulse: (!) 57 (!) 40 61 (!) 56  Resp: 20 16 20    Temp: 97.8 F (36.6 C) 97.7 F (36.5 C) 99.4 F (37.4 C)   TempSrc: Oral Oral Oral   SpO2: 96% 97% 99% 100%  Weight:      Height:        Intake/Output Summary (Last 24 hours) at 02/20/2023 2018 Last data filed at 02/20/2023 1529 Gross per 24 hour  Intake --  Output 975 ml  Net -975 ml   Filed Weights   02/19/23 0515  Weight: 103.2 kg    Examination:   Constitutional: NAD, AAOx3 HEENT: conjunctivae and lids normal, EOMI CV: No cyanosis.   RESP: normal respiratory effort, on RA Extremities: Mild edema in BLE, linear abrasion over left shin. SKIN: warm, dry Neuro: II - XII grossly intact.   Psych: Normal mood and affect.  Appropriate judgement and reason   Data Reviewed: I have personally reviewed labs and imaging studies  Time spent: 35 minutes  Darlin Priestly, MD  [facility] If 7PM-7AM, please contact night-coverage 02/20/2023, 8:18 PM

## 2023-02-20 NOTE — Plan of Care (Signed)

## 2023-02-20 NOTE — Progress Notes (Signed)
Telephone note  Patient has refused to wear the cervical collar.  We did recommend her wear and did get a flexion-extension film today.  The x-ray does not show any significant abnormal motion at the C6-7 level with a concern for the osteophyte fracture.  Given this and stable physical exam, I do think she can wear the collar for comfort at this time.  She should avoid any falls or other extraneous movements of the neck.  She will still need follow-up as an outpatient

## 2023-02-21 DIAGNOSIS — R531 Weakness: Secondary | ICD-10-CM | POA: Diagnosis not present

## 2023-02-21 LAB — GLUCOSE, CAPILLARY: Glucose-Capillary: 69 mg/dL — ABNORMAL LOW (ref 70–99)

## 2023-02-21 LAB — POTASSIUM: Potassium: 4 mmol/L (ref 3.5–5.1)

## 2023-02-21 NOTE — NC FL2 (Signed)
[place] MEDICAID FL2 LEVEL OF CARE FORM     IDENTIFICATION  Patient Name: Robin Espinoza Birthdate: [birth year] Sex: female Admission Date (Current Location): 02/17/2023  [place] and [place] Number:  Chiropodist and Address:  [facility], [address] [place], [place] [address]      Provider Number: [id number]  Attending Physician Name and Address:  Sunnie Nielsen, DO  Relative Name and Phone Number:  Lowry Bowl (Daughter)  [phone] [facility] Phone)    Current Level of Care: Hospital Recommended Level of Care: Skilled Nursing Facility Prior Approval Number: [id number]  Date Approved/Denied: 05/03/12 PASRR Number:    Discharge Plan: SNF    Current Diagnoses: Patient Active Problem List   Diagnosis Date Noted   Leukocytosis 02/17/2023   C6 cervical fracture (HCC) 02/17/2023   Altered mental status 12/29/2022   Major depressive disorder, recurrent episode, moderate (HCC) 11/24/2022   Hypotension 11/21/2022   Falls frequently 11/21/2022   Bilateral leg weakness 11/20/2022   Chronic atrial fibrillation (HCC) 11/13/2022   Muscle weakness (generalized) 09/23/2022   Bilateral leg edema 09/19/2022   Atrial flutter (HCC) 09/19/2022   Atrial flutter with rapid ventricular response (HCC) 09/18/2022   Acute heart failure with preserved ejection fraction (HFpEF) (HCC) 09/18/2022   Atrial fibrillation (HCC) 09/16/2022   Elevated brain natriuretic peptide (BNP) level 09/16/2022   Hypokalemia 09/16/2022   Essential hypertension 09/16/2022   Dyslipidemia 09/16/2022   GERD without esophagitis 09/16/2022   Generalized weakness 01/30/2021   Depression, recurrent (HCC) 01/30/2021   Chronic idiopathic constipation 01/30/2021   Need for shingles vaccine 01/30/2021   Hypothyroidism 11/13/2014   Anxiety disorder 10/15/2014   Insomnia 09/21/2014   Acid reflux 11/30/2007   Arthritis, degenerative 09/16/2007   Obstructive apnea  05/06/2006    Orientation RESPIRATION BLADDER Height & Weight     Self, Time, Situation, Place  Normal Incontinent (Urgency incontinence) Weight: 100.9 kg Height:  5\' 5"  (165.1 cm)  BEHAVIORAL SYMPTOMS/MOOD NEUROLOGICAL BOWEL NUTRITION STATUS     (02/17/23 with c/o fall (legs gave out); c/o neck pain.  Imaging showing "Minimally displaced fracture through a C6-C7 ventral osteophyte, new from the prior cervical spine CT of 01/11/2023 and likely acute".) Continent Diet  AMBULATORY STATUS COMMUNICATION OF NEEDS Skin   Extensive Assist Verbally PU Stage and Appropriate Care PU Stage 1 Dressing: Daily (Mepliex sacral preventative foam dressing.)                     Personal Care Assistance Level of Assistance  Bathing, Dressing Bathing Assistance: Maximum assistance   Dressing Assistance: Maximum assistance     Functional Limitations Info  Sight, Hearing Sight Info: Impaired Hearing Info: Impaired      SPECIAL CARE FACTORS FREQUENCY  PT (By licensed PT), OT (By licensed OT)     PT Frequency: 5x/week (Cervical C6-C7 issues.) OT Frequency: 5x/week (Cervical C6-C7 issues.)            Contractures Contractures Info: Not present    Additional Factors Info  Code Status, Allergies Code Status Info: DNR_Limited: See chart for further details. Allergies Info: Oxycodone, Amlodipine as of today.           Current Medications (02/21/2023):  This is the current hospital active medication list Current Facility-Administered Medications  Medication Dose Route Frequency Provider Last Rate Last Admin   acetaminophen (TYLENOL) tablet 650 mg  650 mg Oral Q6H PRN Rodolph Bong, MD   650 mg at 02/21/23 1159  Or   acetaminophen (TYLENOL) suppository 650 mg  650 mg Rectal Q6H PRN Rodolph Bong, MD       amiodarone (PACERONE) tablet 200 mg  200 mg Oral Daily Rodolph Bong, MD   200 mg at 02/21/23 0947   apixaban (ELIQUIS) tablet 5 mg  5 mg Oral BID Rodolph Bong, MD    5 mg at 02/21/23 4098   ARIPiprazole (ABILIFY) tablet 2 mg  2 mg Oral Daily Rodolph Bong, MD   2 mg at 02/21/23 1191   docusate sodium (COLACE) capsule 100 mg  100 mg Oral BID Rodolph Bong, MD   100 mg at 02/21/23 0947   DULoxetine (CYMBALTA) DR capsule 60 mg  60 mg Oral Daily Rodolph Bong, MD   60 mg at 02/21/23 0946   gabapentin (NEURONTIN) capsule 200 mg  200 mg Oral BID Rodolph Bong, MD   200 mg at 02/21/23 1159   hydrALAZINE (APRESOLINE) injection 5 mg  5 mg Intravenous Q6H PRN Rodolph Bong, MD       ketorolac (TORADOL) 15 MG/ML injection 15 mg  15 mg Intravenous Q6H PRN Rodolph Bong, MD   15 mg at 02/18/23 2117   levalbuterol (XOPENEX) nebulizer solution 0.63 mg  0.63 mg Nebulization Q6H PRN Rodolph Bong, MD       levothyroxine (SYNTHROID) tablet 50 mcg  50 mcg Oral Q0600 Rodolph Bong, MD   50 mcg at 02/21/23 0542   methocarbamol (ROBAXIN) injection 500 mg  500 mg Intravenous Q6H PRN Rodolph Bong, MD       metoprolol succinate (TOPROL-XL) 24 hr tablet 50 mg  50 mg Oral Daily Rodolph Bong, MD   50 mg at 02/21/23 0947   ondansetron (ZOFRAN) tablet 4 mg  4 mg Oral Q6H PRN Rodolph Bong, MD       Or   ondansetron [facility]) injection 4 mg  4 mg Intravenous Q6H PRN Rodolph Bong, MD       pantoprazole (PROTONIX) EC tablet 40 mg  40 mg Oral Daily Rodolph Bong, MD   40 mg at 02/21/23 0947   polyethylene glycol (MIRALAX / GLYCOLAX) packet 17 g  17 g Oral Daily PRN Rodolph Bong, MD   17 g at 02/18/23 1736   polyvinyl alcohol (LIQUIFILM TEARS) 1.4 % ophthalmic solution 1 drop  1 drop Both Eyes PRN Darlin Priestly, MD       potassium chloride SA (KLOR-CON M) CR tablet 40 mEq  40 mEq Oral Daily Darlin Priestly, MD   40 mEq at 02/21/23 0947   pravastatin (PRAVACHOL) tablet 40 mg  40 mg Oral q1800 Rodolph Bong, MD   40 mg at 02/20/23 1742   senna (SENOKOT) tablet 17.2 mg  2 tablet Oral QHS Rodolph Bong, MD   17.2 mg at  02/20/23 2131   sodium chloride flush (NS) 0.9 % injection 3 mL  3 mL Intravenous Q12H Rodolph Bong, MD   3 mL at 02/21/23 4782     Discharge Medications: Please see discharge summary for a list of discharge medications.  Relevant Imaging Results:  Relevant Lab Results:   Additional Information SS# [id number]  Bing Quarry, RN

## 2023-02-21 NOTE — Hospital Course (Addendum)
HPI: Robin Espinoza is a 77 y.o. female with medical history significant of osteoarthritis, frequent falls uses a walker to ambulate, chronic A-fib on anticoagulation, hypertension, hypothyroidism, presenting to the ED with generalized weakness after a fall.     Hospital course / significant events:  11/27: admitted to hospitalist service w/ weakness/fall resulting in C6-C7 ventral osteophyte fracture, neurosurgery, Dr. Adriana Simas recommended Aspen neck collar and MRI of the C-spine.  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 PT/OT, SNF rehab   Hypokalemia - resolved Replace as needed Monitor BMP periodically    Hypothyroidism - stable cont home synthroid   Chronic A-fib - stable  movement artifacts sometimes causing the appearance of RVR, but pulse check confirmed rate controlled. cont amiodarone and Toprol cont Eliquis   Essential Hypertension - stable  BP currently intermittently soft Adjust medications as needed   Dehydration - resolved s/p IVF Monitor Encourage po intake    GERD - stable  PPI.   Leukocytosis - resolved  Likely reactive leukocytosis. normalized without abx Monitor periodic CBC / follow outpatient    Depression/anxiety - stable cont home regimen of Abilify, Cymbalta.   Hyperlipidemia -  stable Continue statin.   History of chronic constipation - stable  cont home regimen Senokot-S.  MiraLAX as needed.     obesity based on BMI: Body mass index is 37.03 kg/m.  Underweight - under 18.5  normal weight - 18.5 to 24.9 overweight - 25 to 29.9 obese - 30 or more   DVT prophylaxis: eliquis  IV fluids: no continuous IV fluids  Nutrition: cardiac diet  Central lines / invasive devices: none  Code Status: DNR ACP documentation reviewed: 02/21/23 and none on file in [facility]  [facility] needs: SNF rehab placement Barriers to dispo / significant pending items: placement - [facility] is still pending as of afternoon 12/03

## 2023-02-21 NOTE — TOC Progression Note (Addendum)
Transition of Care [facility]) - Progression Note    Patient Details  Name: Robin Espinoza MRN: [id number] Date of Birth: 1945/04/25  [facility] CM/SW Contact  Bing Quarry, RN Phone Number: 02/21/2023, 1:42 PM  Clinical Narrative: 12/1: Sherron Monday with patient regarding STR/SNF choice in follow up to prior CM attempt. Patient has chosen [facility] as first preference and asked to choose a second, [facility]. CSW referral sent to [facility] and [facility]. Prior PASRR from 2014: [id number]   Gabriel Cirri MSN RN CM  Care Management Department.  [place]  [facility] Direct Dial: [phone] Main Office Phone: [phone] Weekends Only            Expected Discharge Plan and Services                                               Social Determinants of Health (SDOH) Interventions SDOH Screenings   Food Insecurity: No Food Insecurity (02/18/2023)  Housing: Low Risk  (02/18/2023)  Transportation Needs: No Transportation Needs (02/18/2023)  Utilities: Not At Risk (02/18/2023)  Alcohol Screen: Low Risk  (09/12/2021)  Depression (PHQ2-9): Medium Risk (09/12/2021)  Financial Resource Strain: Low Risk  (08/31/2022)   Received from [facility], [facility]  Physical Activity: Inactive (04/04/2020)  Social Connections: Socially Integrated (04/04/2020)  Stress: No Stress Concern Present (04/04/2020)  Tobacco Use: Medium Risk (02/17/2023)    Readmission Risk Interventions     No data to display

## 2023-02-21 NOTE — Plan of Care (Signed)

## 2023-02-21 NOTE — Progress Notes (Signed)
PROGRESS NOTE    Robin Espinoza   VQQ:[id number] DOB: Jul 06, 1945  DOA: 02/17/2023 Date of Service: 02/21/23 which is hospital day 0  PCP: Lauro Regulus, MD    HPI: Robin Espinoza is a 77 y.o. female with medical history significant of osteoarthritis, frequent falls uses a walker to ambulate, chronic A-fib on anticoagulation, hypertension, hypothyroidism, presenting to the ED with generalized weakness after a fall.     Hospital course / significant events:  11/27: admitted to hospitalist service w/ weakness/fall resulting in C6-C7 ventral osteophyte fracture, neurosurgery, Dr. Adriana Simas recommended Aspen neck collar and MRI of the C-spine.  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 PT/OT, SNF rehab   Hypokalemia Replace as needed Monitor BMP   Hypothyroidism cont home synthroid   Chronic A-fib movement artifacts sometimes causing the appearance of RVR, but pulse check confirmed rate controlled. cont amiodarone and Toprol cont Eliquis   Essential Hypertension BP currently intermittently soft Adjust medications as needed   Dehydration s/p IVF Monitor Encourage po intake    GERD PPI.   Leukocytosis Likely reactive leukocytosis. normalized without abx Monitor periodic CBC / follow outpatient     Depression/anxiety cont home regimen of Abilify, Cymbalta.   Hyperlipidemia Continue statin.   History of chronic constipation cont home regimen Senokot-S.  MiraLAX as needed.     obesity based on BMI: Body mass index is 37.03 kg/m.  Underweight - under 18.5  normal weight - 18.5 to 24.9 overweight - 25 to 29.9 obese - 30 or more   DVT prophylaxis: eliquis  IV fluids: no continuous IV fluids  Nutrition: cardiac diet  Central lines / invasive devices: none  Code Status: DNR ACP documentation reviewed: 02/21/23 and none on file in [facility]  [facility] needs: SNF rehab placement Barriers to dispo / significant pending items: placement              Subjective / Brief ROS:  Patient reports doing well today Still feeling generally weak and worried about falling  Denies CP/SOB.  Pain controlled.  Denies new weakness.  Tolerating diet.  Reports no concerns w/ urination/defecation.   Family Communication: none at this time    Objective Findings:  Vitals:   02/20/23 2143 02/21/23 0400 02/21/23 0737 02/21/23 1210  BP: (!) 149/88 (!) 160/136 126/76 (!) 142/84  Pulse: (!) 59 62 64 99  Resp: 16 18 16 16   Temp: 98.7 F (37.1 C) 97.8 F (36.6 C) 97.8 F (36.6 C) 97.9 F (36.6 C)  TempSrc: Oral Oral    SpO2: 100% 98% 100% 91%  Weight:  100.9 kg    Height:        Intake/Output Summary (Last 24 hours) at 02/21/2023 1515 Last data filed at 02/21/2023 0300 Gross per 24 hour  Intake 440 ml  Output 750 ml  Net -  310 ml   Filed Weights   02/19/23 0515 02/21/23 0400  Weight: 103.2 kg 100.9 kg    Examination:  Physical Exam Constitutional:      General: She is not in acute distress. Cardiovascular:     Rate and Rhythm: Normal rate and regular rhythm.  Pulmonary:     Effort: Pulmonary effort is normal.     Breath sounds: Normal breath sounds.  Abdominal:     Palpations: Abdomen is soft.  Musculoskeletal:     Right lower leg: No edema.     Left lower leg:  No edema.  Skin:    General: Skin is warm and dry.  Neurological:     General: No focal deficit present.     Mental Status: She is alert. Mental status is at baseline.  Psychiatric:        Mood and Affect: Mood normal.        Behavior: Behavior normal.          Scheduled Medications:   amiodarone  200 mg Oral Daily   apixaban  5 mg Oral BID   ARIPiprazole  2 mg Oral Daily   docusate sodium  100 mg Oral BID   DULoxetine  60 mg Oral Daily   gabapentin  200 mg Oral BID   levothyroxine  50 mcg Oral Q0600   metoprolol succinate  50 mg Oral Daily   pantoprazole  40 mg Oral Daily   potassium chloride  40 mEq Oral Daily   pravastatin  40 mg Oral q1800   senna  2 tablet Oral QHS   sodium chloride flush  3 mL Intravenous Q12H    Continuous Infusions:   PRN Medications:  acetaminophen **OR** acetaminophen, hydrALAZINE, ketorolac, levalbuterol, methocarbamol (ROBAXIN) injection, ondansetron **OR** ondansetron (ZOFRAN) IV, polyethylene glycol, polyvinyl alcohol  Antimicrobials from admission:  Anti-infectives (From admission, onward)    None           Data Reviewed:  I have personally reviewed the following...  CBC: Recent Labs  Lab 02/17/23 1537 02/18/23 0511 02/19/23 0414 02/20/23 0249  WBC 11.2* 9.4 6.8 6.7  HGB 13.3 11.8* 11.2* 11.1*  HCT 39.3 36.3 33.0* 33.4*  MCV 97.8 100.8* 97.1 99.7  PLT 176 149* 144* 152   Basic Metabolic Panel: Recent Labs  Lab 02/17/23 1537 02/17/23 2221 02/18/23 0511 02/19/23 0414 02/20/23 0249 02/21/23 0343  NA 135  --  138 135 134*  --   K 3.1*  --  3.2* 3.3* 3.2* 4.0  CL 90*  --  92* 93* 95*  --   CO2 33*  --  32 32 29  --   GLUCOSE 100*  --  95 98 107*  --   BUN 27*  --  25* 23 17  --   CREATININE 1.11*  --  0.97 1.09* 0.76  --   CALCIUM 8.7*  --  8.5* 8.2* 8.0*  --   MG  --  1.8 2.0 2.1 2.5*  --    GFR: Estimated Creatinine Clearance: 68.3 mL/min (by C-G formula based on SCr of 0.76 mg/dL). Liver Function  Tests: Recent Labs  Lab 02/18/23 0511  AST 20  ALT 9  ALKPHOS 93  BILITOT 1.0  PROT 6.1*  ALBUMIN 3.3*   No results for input(s): "LIPASE", "AMYLASE" in the last 168 hours. No results for input(s): "AMMONIA" in the last 168 hours. Coagulation Profile: No results for input(s): "INR", "PROTIME" in the last 168 hours. Cardiac Enzymes: No results for input(s): "  CKTOTAL", "CKMB", "CKMBINDEX", "TROPONINI" in the last 168 hours. BNP (last 3 results) No results for input(s): "PROBNP" in the last 8760 hours. HbA1C: No results for input(s): "HGBA1C" in the last 72 hours. CBG: Recent Labs  Lab 02/20/23 0727 02/20/23 1148 02/21/23 0741  GLUCAP 97 85 69*   Lipid Profile: No results for input(s): "CHOL", "HDL", "LDLCALC", "TRIG", "CHOLHDL", "LDLDIRECT" in the last 72 hours. Thyroid Function Tests: No results for input(s): "TSH", "T4TOTAL", "FREET4", "T3FREE", "THYROIDAB" in the last 72 hours. Anemia Panel: No results for input(s): "VITAMINB12", "FOLATE", "FERRITIN", "TIBC", "IRON", "RETICCTPCT" in the last 72 hours. Most Recent Urinalysis On File:     Component Value Date/Time   COLORURINE YELLOW (A) 02/17/2023 1833   APPEARANCEUR CLEAR (A) 02/17/2023 1833   APPEARANCEUR Hazy (A) 06/22/2022 1446   LABSPEC 1.008 02/17/2023 1833   LABSPEC 1.020 05/02/2012 1036   PHURINE 6.0 02/17/2023 1833   GLUCOSEU NEGATIVE 02/17/2023 1833   GLUCOSEU Negative 05/02/2012 1036   HGBUR Mastrangelo (A) 02/17/2023 1833   BILIRUBINUR NEGATIVE 02/17/2023 1833   BILIRUBINUR Negative 06/22/2022 1446   BILIRUBINUR Negative 05/02/2012 1036   KETONESUR NEGATIVE 02/17/2023 1833   PROTEINUR NEGATIVE 02/17/2023 1833   UROBILINOGEN 0.2 08/22/2019 1605   NITRITE NEGATIVE 02/17/2023 1833   LEUKOCYTESUR NEGATIVE 02/17/2023 1833   LEUKOCYTESUR 3+ 05/02/2012 1036   Sepsis Labs: @LABRCNTIP (procalcitonin:4,lacticidven:4) Microbiology: No results found for this or any previous visit (from the past 240 hour(s)).     Radiology Studies last 3 days: DG Cervical Spine 2 or 3 views  Result Date: 02/20/2023 CLINICAL DATA:  Neck pain EXAM: CERVICAL SPINE - 2-3 VIEW COMPARISON:  02/17/2023 FINDINGS: Lateral flexion and extension radiographs of the cervical spine demonstrate 3 mm of retrolisthesis of C3 on C4 on extension which reduces upon flexion. Anterior endplate osteophyte fracture at the C6-7 level seen on prior CT is obscured on this examination. No new fractures are seen. No prevertebral soft tissue swelling. IMPRESSION: 1. There is 3 mm of retrolisthesis of C3 on C4 on extension which reduces upon flexion. 2. Anterior endplate osteophyte fracture at the C6-7 level seen on prior CT is obscured on this examination. Electronically Signed   By: Duanne Guess D.O.   On: 02/20/2023 14:06   MR LUMBAR SPINE WO CONTRAST  Result Date: 02/17/2023 CLINICAL DATA:  Initial evaluation for back pain, fall. EXAM: MRI LUMBAR SPINE WITHOUT CONTRAST TECHNIQUE: Multiplanar, multisequence MR imaging of the lumbar spine was performed. No intravenous contrast was administered. COMPARISON:  None Available. FINDINGS: Segmentation: Transitional features seen about the lumbosacral junction with partial lumbarization of the S1 segment and rudimentary S1-2 interspace. Alignment: Moderate levoscoliosis with straightening of the normal lumbar lordosis. 3 mm degenerative retrolisthesis of L1 on L2 and L2 on L3. Trace anterolisthesis of L5 on S1. Vertebrae: Vertebral body height maintained without acute or chronic fracture. Bone marrow signal intensity within normal limits. Few scattered benign hemangiomata noted. No worrisome osseous lesions. Mild degenerative reactive endplate changes present about the L2-3 interspace. No other abnormal marrow edema. Conus medullaris and cauda equina: Conus extends to the L2 level. Conus and cauda equina appear normal. Paraspinal and other soft tissues: Paraspinous soft tissues demonstrate no acute finding.  Chronic fatty atrophy noted within the posterior paraspinous musculature. Few subcentimeter simple T2 hyperintense renal cysts noted bilaterally, benign in appearance, no follow-up imaging recommended. Cholelithiasis noted. Disc levels: L1-2: Degenerative intervertebral disc space narrowing with diffuse disc bulge. Reactive endplate spurring. Moderate bilateral facet hypertrophy. Resultant mild canal with bilateral lateral  recess stenosis. Mild right L1 foraminal narrowing. Left neural foramen remains patent. L2-3: Degenerative intervertebral disc space narrowing with diffuse disc bulge and reactive endplate spurring, worse on the right. Moderate bilateral facet hypertrophy. Resultant moderate canal with moderate right worse than left lateral recess stenosis. Moderate right L2 foraminal narrowing. Left neural foramen remains patent. L3-4: Degenerative vertebral disc space narrowing with diffuse disc bulge and reactive endplate spurring, worse on the right. Superimposed central to left subarticular disc osteophyte indents the ventral thecal sac. Mild facet and ligament flavum hypertrophy. Resultant moderate spinal stenosis. Mild right L3 foraminal narrowing. Left neural foramen remains patent. L4-5: Disc desiccation with reactive endplate spurring, worse on the right. Moderate bilateral facet hypertrophy. Resultant mild canal with mild left greater than right lateral recess stenosis. Mild bilateral L4 foraminal narrowing. L5-S1: Disc desiccation with diffuse disc bulge. Reactive endplate spurring, worse on the left. Central disc protrusion indents the ventral thecal sac. Mild to moderate bilateral facet hypertrophy. Resultant mild narrowing of the right lateral recess. Central canal remains patent. Mild right with mild-to-moderate left L5 foraminal stenosis. S1-2: Disc desiccation with annular fissuring. [place] central to right subarticular disc protrusion closely approximates the descending right S2 nerve root. Mild  facet hypertrophy bilaterally. No significant spinal stenosis. Foramina remain patent. IMPRESSION: 1. No acute traumatic injury within the lumbar spine. 2. Multilevel lumbar spondylosis with resultant mild to moderate canal and bilateral lateral recess stenosis at L1-2 through L3-4. Associated mild to moderate multilevel foraminal narrowing as detailed above. 3. Central to right subarticular disc protrusion at S1-2, closely approximating and potentially affecting the descending right S2 nerve root. 4. Transitional lumbosacral anatomy. Careful correlation with numbering system on this exam recommended prior to any potential future intervention. 5. Cholelithiasis. Electronically Signed   By: Rise Mu M.D.   On: 02/17/2023 23:01   MR THORACIC SPINE WO CONTRAST  Result Date: 02/17/2023 CLINICAL DATA:  Initial evaluation for mid back pain, fall. EXAM: MRI THORACIC SPINE WITHOUT CONTRAST TECHNIQUE: Multiplanar, multisequence MR imaging of the thoracic spine was performed. No intravenous contrast was administered. COMPARISON:  None Available. FINDINGS: Alignment: Straightening of the normal thoracic kyphosis with underlying trace levoscoliosis. Mild grade 1 degenerative anterolisthesis of T2 on T3 and T3 on T4. Trace degenerative retrolisthesis of T12 on L1. Vertebrae: Vertebral body height maintained without acute or chronic fracture. Bone marrow signal intensity within normal limits. Few scattered benign hemangiomata noted. No worrisome osseous lesions. No abnormal marrow edema. Cord: Question subtle patchy cord signal changes involving the central and right cord at the level of T9-10 (series 20, image 28). Otherwise normal signal morphology. No other convincing cord signal changes. Paraspinal and other soft tissues: Paraspinous soft tissues within normal limits. 2 cm stone noted within the gallbladder. Disc levels: T1-2: Diffuse disc bulge with uncovertebral spurring. Mild facet hypertrophy. No  significant spinal stenosis. Foramina remain patent. T2-3: Diffuse disc bulge with endplate spurring. Superimposed [place] central disc protrusion indents the ventral thecal sac. Right-sided facet degeneration. No significant spinal stenosis. Mild to moderate right foraminal narrowing. Left neural foramina remains patent. T3-4: [place] central disc protrusion indents the ventral thecal sac. Right worse than left facet hypertrophy. No significant spinal stenosis. Mild to moderate bilateral foraminal narrowing. T4-5: [place] right paracentral disc protrusion indents the ventral thecal sac. Mild posterior element hypertrophy. No significant spinal stenosis. Mild right foraminal narrowing. T5-6: Right paracentral disc protrusion indents the ventral thecal sac. Minimal cord flattening without cord signal changes. Mild facet hypertrophy. Resultant mild spinal stenosis.  Mild left foraminal narrowing. T6-7: Central to right paracentral disc protrusion indents the ventral thecal sac. Minimal cord flattening without cord signal changes. Mild spinal stenosis. Foramina remain patent. T7-8: Central to left paracentral disc protrusion indents the ventral thecal sac, contacting and mildly flattening the ventral cord. No visible cord signal changes. Mild posterior element hypertrophy. Moderate spinal stenosis. Foramina remain patent. T8-9: Central disc protrusion indents the ventral thecal sac. Mild posterior element hypertrophy. No significant spinal stenosis. Foramina remain patent. T9-10: Degenerative vertebral disc space narrowing with diffuse disc bulge and endplate spurring. Superimposed right paracentral disc protrusion indents the right ventral thecal sac, contacting and mildly flattening the right ventral cord. Question of subtle patchy cord signal changes at this level. Superimposed posterior element hypertrophy. Moderate spinal stenosis. Mild to moderate bilateral foraminal narrowing. T10-11: Diffuse disc bulge with endplate  and uncovertebral spurring. Bilateral facet hypertrophy. Resultant mild-to-moderate spinal stenosis. Moderate bilateral foraminal narrowing. T11-12: Diffuse disc bulge with endplate and uncovertebral spurring, asymmetric to the left. Moderate bilateral facet hypertrophy. Resultant moderate to severe spinal stenosis (series 20, image 34). Secondary cord flattening without convincing cord signal changes. Severe left with moderate right foraminal stenosis. T12-L1: Diffuse disc bulge with endplate spurring, asymmetric to the left. Superimposed Cobbins left subarticular disc extrusion with inferior migration. Mild facet hypertrophy. Resultant mild spinal stenosis. Mild to moderate left with mild right foraminal stenosis. IMPRESSION: 1. No acute osseous abnormality within the thoracic spine. 2. Multilevel thoracic spondylosis with resultant diffuse spinal stenosis as detailed above, greatest at T9-10 and T11-12. Question subtle patchy cord signal changes at the T9-10 level, suspicious for compressive myelomalacia. 3. Multilevel foraminal narrowing related to disc bulge, endplate spurring, and facet hypertrophy as detailed above, most pronounced at T11-12 on the left. 4. Cholelithiasis. Electronically Signed   By: Rise Mu M.D.   On: 02/17/2023 22:47   MR Cervical Spine Wo Contrast  Result Date: 02/17/2023 CLINICAL DATA:  Initial evaluation for acute myelopathy. EXAM: MRI CERVICAL SPINE WITHOUT CONTRAST TECHNIQUE: Multiplanar, multisequence MR imaging of the cervical spine was performed. No intravenous contrast was administered. COMPARISON:  None Available. FINDINGS: Alignment: Straightening of the normal cervical lordosis. Trace degenerative retrolisthesis of C3 on C4, with trace anterolisthesis of C7 on T1. Vertebrae: Vertebral body height maintained without acute or chronic fracture. Bone marrow signal intensity within normal limits. [place] benign hemangioma noted within the C7 vertebral body. No worrisome  osseous lesions. No abnormal marrow edema. Cord: Normal signal and morphology. Posterior Fossa, vertebral arteries, paraspinal tissues: Unremarkable. Disc levels: C2-C3: Minimal disc bulge with endplate spurring. Mild left-sided facet hypertrophy. No significant spinal stenosis. Foramina remain patent. C3-C4: Degenerative intervertebral disc space narrowing with diffuse disc osteophyte. Flattening and partial effacement of the ventral thecal sac. Superimposed mild facet hypertrophy. Resultant moderate spinal stenosis with severe bilateral C4 foraminal narrowing. C4-C5: Degenerative intervertebral disc space narrowing with diffuse disc osteophyte complex. Posterior component flattens and partially faces the ventral thecal sac. Moderate right facet hypertrophy. Resultant mild-to-moderate spinal stenosis. Severe right with moderate left C5 foraminal stenosis. C5-C6: Degenerative intervertebral disc space narrowing with diffuse disc bulge and bilateral uncovertebral spurring. Flattening of the ventral thecal sac with no more than mild spinal stenosis. Moderate to severe bilateral C6 foraminal narrowing. C6-C7: Degenerate intervertebral disc space narrowing with circumferential disc osteophyte complex. Flattening and partial effacement of the ventral thecal sac with mild cord flattening, but no cord signal changes. Mild spinal stenosis. Moderate bilateral C7 foraminal narrowing. C7-T1: Trace anterolisthesis. No significant disc bulge. Moderate  right facet hypertrophy. No spinal stenosis. Foramina remain patent. IMPRESSION: 1. Normal MRI appearance of the cervical spinal cord. No cord signal changes to suggest myelopathy. 2. Multilevel cervical spondylosis with resultant mild to moderate diffuse spinal stenosis at C3-4 through C6-7. 3. Multifactorial degenerative changes with resultant multilevel foraminal narrowing as above. Notable findings include severe bilateral C4 foraminal stenosis, severe right with moderate left  C5 foraminal narrowing, moderate to severe bilateral C6 foraminal stenosis, with moderate bilateral C7 foraminal narrowing. Electronically Signed   By: Rise Mu M.D.   On: 02/17/2023 22:36   DG Chest Portable 1 View  Result Date: 02/17/2023 CLINICAL DATA:  Larey Seat getting out of a chair. Pain to lower back and both legs. Abrasion to left side of forehead. On Eliquis. EXAM: PORTABLE CHEST 1 VIEW COMPARISON:  09/16/2022 FINDINGS: Stable cardiomediastinal silhouette. Aortic atherosclerotic calcification. No focal consolidation, pleural effusion, or pneumothorax. No displaced rib fractures. IMPRESSION: No active disease. Electronically Signed   By: Minerva Fester M.D.   On: 02/17/2023 19:19   CT HEAD WO CONTRAST  Result Date: 02/17/2023 CLINICAL DATA:  Provided history: Head trauma, minor. Neck pain, no cervical tenderness. Additional history provided: fall, low back pain, leg pain, forehead abrasion, on Eliquis. EXAM: CT HEAD WITHOUT CONTRAST CT CERVICAL SPINE WITHOUT CONTRAST TECHNIQUE: Multidetector CT imaging of the head and cervical spine was performed following the standard protocol without intravenous contrast. Multiplanar CT image reconstructions of the cervical spine were also generated. RADIATION DOSE REDUCTION: This exam was performed according to the departmental dose-optimization program which includes automated exposure control, adjustment of the mA and/or kV according to patient size and/or use of iterative reconstruction technique. COMPARISON:  CT head and CT cervical spine 01/11/2023. FINDINGS: CT HEAD FINDINGS Brain: No age advanced or lobar predominant parenchymal atrophy. Patchy and ill-defined hypoattenuation within the cerebral white matter, nonspecific but compatible with mild chronic [place]rim[place] vessel ischemic disease. There is no acute intracranial hemorrhage. No demarcated cortical infarct. No extra-axial fluid collection. No evidence of an intracranial mass. No midline shift.  Vascular: No hyperdense vessel.  Atherosclerotic calcifications. Skull: No calvarial fracture or aggressive osseous lesion. Sinuses/Orbits: No acute finding within the orbits. Schermerhorn-volume frothy secretions within the bilateral sphenoid sinuses. Other: Ill-defined increased density within the left periorbital soft tissues, likely reflecting sequela of the hematoma and laceration which were present at this site on the prior head CT of 01/11/2023. CT CERVICAL SPINE FINDINGS Alignment: Mild levocurvature of the cervical spine. 2 mm C3-C4 grade 1 retrolisthesis. Slight C4-C5 grade 1 anterolisthesis. 2 mm C5-C6 grade 1 anterolisthesis. 2 mm C7-T1 grade 1 anterolisthesis. Skull base and vertebrae: The basion-dental and atlanto-dental intervals are maintained. Minimally displaced fracture through a C6-C7 ventral osteophyte, new from prior cervical spine CT of 01/11/2023 (for instance as seen on series 6, image 29) (series 7, images 62 and 63). No other cervical spine fracture identified elsewhere. Soft tissues and spinal canal: No prevertebral fluid or swelling. No visible canal hematoma. Disc levels: Cervical spondylosis with multilevel disc space narrowing, disc bulges/central disc protrusions, endplate spurring, uncovertebral hypertrophy and facet arthrosis. Disc space narrowing is moderate-to-advanced at C3-C4, C4-C5, C5-C6 and C6-C7. Multilevel spinal canal narrowing. Most notably at C3-C4, a partially calcified central disc protrusion contributes to suspected moderate spinal canal stenosis. Multilevel bony neural foraminal narrowing. Multilevel ventrolateral osteophytes, some bridging. Upper chest: No airspace consolidation within the imaged lung apices. No visible pneumothorax. IMPRESSION: CT head: 1. No evidence of an acute intracranial abnormality. 2. Mild chronic Keeven vessel  ischemic changes within the cerebral white matter. 3. Ill-defined increased density within the left periorbital soft tissues, likely  reflecting sequela of the hematoma and laceration which were present at this site on the prior examination of 01/11/2023. 4. Mild bilateral sphenoid sinusitis. CT cervical spine: 1. Minimally displaced fracture through a C6-C7 ventral osteophyte, new from the prior cervical spine CT of 01/11/2023 and likely acute. 2. Mild levocurvature of the cervical spine. 3. Mild grade 1 spondylolisthesis at C3-C4, C4-C5, C5-C6 and C7-T1. 4. Cervical spondylosis as described. Electronically Signed   By: Jackey Loge D.O.   On: 02/17/2023 16:47   CT Cervical Spine Wo Contrast  Result Date: 02/17/2023 CLINICAL DATA:  Provided history: Head trauma, minor. Neck pain, no cervical tenderness. Additional history provided: fall, low back pain, leg pain, forehead abrasion, on Eliquis. EXAM: CT HEAD WITHOUT CONTRAST CT CERVICAL SPINE WITHOUT CONTRAST TECHNIQUE: Multidetector CT imaging of the head and cervical spine was performed following the standard protocol without intravenous contrast. Multiplanar CT image reconstructions of the cervical spine were also generated. RADIATION DOSE REDUCTION: This exam was performed according to the departmental dose-optimization program which includes automated exposure control, adjustment of the mA and/or kV according to patient size and/or use of iterative reconstruction technique. COMPARISON:  CT head and CT cervical spine 01/11/2023. FINDINGS: CT HEAD FINDINGS Brain: No age advanced or lobar predominant parenchymal atrophy. Patchy and ill-defined hypoattenuation within the cerebral white matter, nonspecific but compatible with mild chronic Donner vessel ischemic disease. There is no acute intracranial hemorrhage. No demarcated cortical infarct. No extra-axial fluid collection. No evidence of an intracranial mass. No midline shift. Vascular: No hyperdense vessel.  Atherosclerotic calcifications. Skull: No calvarial fracture or aggressive osseous lesion. Sinuses/Orbits: No acute finding within the  orbits. Lauman-volume frothy secretions within the bilateral sphenoid sinuses. Other: Ill-defined increased density within the left periorbital soft tissues, likely reflecting sequela of the hematoma and laceration which were present at this site on the prior head CT of 01/11/2023. CT CERVICAL SPINE FINDINGS Alignment: Mild levocurvature of the cervical spine. 2 mm C3-C4 grade 1 retrolisthesis. Slight C4-C5 grade 1 anterolisthesis. 2 mm C5-C6 grade 1 anterolisthesis. 2 mm C7-T1 grade 1 anterolisthesis. Skull base and vertebrae: The basion-dental and atlanto-dental intervals are maintained. Minimally displaced fracture through a C6-C7 ventral osteophyte, new from prior cervical spine CT of 01/11/2023 (for instance as seen on series 6, image 29) (series 7, images 62 and 63). No other cervical spine fracture identified elsewhere. Soft tissues and spinal canal: No prevertebral fluid or swelling. No visible canal hematoma. Disc levels: Cervical spondylosis with multilevel disc space narrowing, disc bulges/central disc protrusions, endplate spurring, uncovertebral hypertrophy and facet arthrosis. Disc space narrowing is moderate-to-advanced at C3-C4, C4-C5, C5-C6 and C6-C7. Multilevel spinal canal narrowing. Most notably at C3-C4, a partially calcified central disc protrusion contributes to suspected moderate spinal canal stenosis. Multilevel bony neural foraminal narrowing. Multilevel ventrolateral osteophytes, some bridging. Upper chest: No airspace consolidation within the imaged lung apices. No visible pneumothorax. IMPRESSION: CT head: 1. No evidence of an acute intracranial abnormality. 2. Mild chronic Santacroce vessel ischemic changes within the cerebral white matter. 3. Ill-defined increased density within the left periorbital soft tissues, likely reflecting sequela of the hematoma and laceration which were present at this site on the prior examination of 01/11/2023. 4. Mild bilateral sphenoid sinusitis. CT cervical  spine: 1. Minimally displaced fracture through a C6-C7 ventral osteophyte, new from the prior cervical spine CT of 01/11/2023 and likely acute. 2. Mild levocurvature of  the cervical spine. 3. Mild grade 1 spondylolisthesis at C3-C4, C4-C5, C5-C6 and C7-T1. 4. Cervical spondylosis as described. Electronically Signed   By: Jackey Loge D.O.   On: 02/17/2023 16:47       Sunnie Nielsen, DO [facility] 02/21/2023, 3:15 PM    Dictation software may have been used to generate the above note. Typos may occur and escape review in typed/dictated notes. Please contact Dr Lyn Hollingshead directly for clarity if needed.  Staff may message me via secure chat in Epic  but this may not receive an immediate response,  please page me for urgent matters!  If 7PM-7AM, please contact night coverage [email or web address]

## 2023-02-22 DIAGNOSIS — R531 Weakness: Secondary | ICD-10-CM | POA: Diagnosis not present

## 2023-02-22 LAB — POTASSIUM: Potassium: 4.2 mmol/L (ref 3.5–5.1)

## 2023-02-22 LAB — GLUCOSE, CAPILLARY
Glucose-Capillary: 99 mg/dL (ref 70–99)
Glucose-Capillary: 92 mg/dL (ref 70–99)
Glucose-Capillary: 137 mg/dL — ABNORMAL HIGH (ref 70–99)

## 2023-02-22 MED ORDER — ACETAMINOPHEN 650 MG RE SUPP: 650.0000 mg | Freq: Four times a day (QID) | RECTAL | Status: DC | PRN

## 2023-02-22 MED ORDER — ACETAMINOPHEN 325 MG PO TABS
650.0000 mg | ORAL_TABLET | Freq: Four times a day (QID) | ORAL | Status: DC | PRN
Start: 2023-02-22 — End: 2023-02-24
  Administered 2023-02-22 – 2023-02-24 (×2): 650 mg via ORAL
  Filled 2023-02-22 (×3): qty 2

## 2023-02-22 MED ORDER — TRAZODONE HCL 50 MG PO TABS
50.0000 mg | ORAL_TABLET | Freq: Once | ORAL | Status: AC
  Administered 2023-02-22: 50 mg via ORAL
  Filled 2023-02-22: qty 1

## 2023-02-22 NOTE — Care Management Obs Status (Signed)
MEDICARE OBSERVATION STATUS NOTIFICATION   Patient Details  Name: Robin Espinoza MRN: [id number] Date of Birth: [birth year]   Medicare Observation Status Notification Given:  Yes    Margarito Liner, LCSW 02/22/2023, 12:27 PM

## 2023-02-22 NOTE — Progress Notes (Signed)
Physical Therapy Treatment Patient Details Name: Robin Espinoza MRN: [id number] DOB: 04-08-1945 Today's Date: 02/22/2023   History of Present Illness Pt is a 77 y.o. female presenting to hospital 02/17/23 with c/o fall (legs gave out); c/o neck pain.  Imaging showing "Minimally displaced fracture through a C6-C7 ventral osteophyte, new from the prior cervical spine CT of 01/11/2023 and likely acute".  Pt admitted with generalized weakness, inability to ambulate, frequent falls, C6-7 osteophyte fx, hypokalemia.Marland Kitchen  PMH includes OA, h/o frequent falls, chronic a-fib, htn, HLD, OSA, R TKA.  H/o falls.    PT Comments  Pt sitting on BSC (over toilet) upon PT arrival (pt reports being finished and needed assist); agreeable to therapy; NT arrived and assisted pt with peri-care post toileting.  During session pt CGA to stand from elevated toilet surface and CGA to ambulate 10 feet back to bed with RW use (limited distance d/t pt fatigue and pt also verbalizing fear of falling).  Pt assisted back into bed and did well tolerating LE ex's in bed with assist as needed.  Pt c/o 7/10 LBP (nurse notified of pt's pain and request for pain meds).  Will continue to focus on strengthening, balance, and progressive functional mobility per pt tolerance.      If plan is discharge home, recommend the following: A little help with walking and/or transfers;A little help with bathing/dressing/bathroom;Assistance with cooking/housework;Assist for transportation;Help with stairs or ramp for entrance   Can travel by private vehicle     No  Equipment Recommendations  Rolling walker (2 wheels);BSC/3in1    Recommendations for Other Services       Precautions / Restrictions Precautions Precautions: Fall Precaution Comments: Per neurosurgery note 02/20/23: "she can wear the collar for comfort at this time.  She should avoid any falls or other extraneous movements of the neck" Required Braces or Orthoses: Cervical  Brace Cervical Brace: Hard collar Restrictions Weight Bearing Restrictions: No     Mobility  Bed Mobility Overal bed mobility: Needs Assistance Bed Mobility: Sit to Supine       Sit to supine: Min assist, +2 for physical assistance   General bed mobility comments: assist for trunk and B LE's; via logrolling    Transfers Overall transfer level: Needs assistance Equipment used: Rolling walker (2 wheels) Transfers: Sit to/from Stand Sit to Stand: Contact guard assist, From elevated surface (from [facility] over toilet)           General transfer comment: vc's to line up to bed prior to sitting    Ambulation/Gait Ambulation/Gait assistance: Contact guard assist Gait Distance (Feet): 10 Feet (bathroom to bed) Assistive device: Rolling walker (2 wheels) Gait Pattern/deviations: Step-through pattern, Decreased step length - right, Decreased step length - left Gait velocity: decreased         Stairs             Wheelchair Mobility     Tilt Bed    Modified Rankin (Stroke Patients Only)       Balance Overall balance assessment: Needs assistance Sitting-balance support: No upper extremity supported, Feet supported Sitting balance-Leahy Scale: Good Sitting balance - Comments: steady reaching within BOS   Standing balance support: During functional activity, Reliant on assistive device for balance, Bilateral upper extremity supported Standing balance-Leahy Scale: Fair Standing balance comment: steady static standing with B UE support  Cognition Arousal: Alert Behavior During Therapy: Anxious Overall Cognitive Status: Within Functional Limits for tasks assessed                                          Exercises General Exercises - Lower Extremity Ankle Circles/Pumps: AROM, Strengthening, Both, 10 reps, Supine Quad Sets: AROM, Strengthening, Both, 10 reps, Supine Short Arc Quad: AROM, Strengthening,  Both, 10 reps, Supine Heel Slides: AROM, Strengthening, Both, 10 reps, Supine Hip ABduction/ADduction: AAROM, Right, AROM, Left, Strengthening, 10 reps, Supine    General Comments  Nursing cleared pt for participation in physical therapy.  Pt agreeable to PT session.  Pt refusing to wear cervical collar.      Pertinent Vitals/Pain Pain Assessment Pain Assessment: 0-10 Pain Score: 7  Pain Location: LBP Pain Descriptors / Indicators: Aching Pain Intervention(s): Limited activity within patient's tolerance, Monitored during session, Repositioned, Patient requesting pain meds-RN notified Vitals (HR and SpO2 on room air) stable and WFL throughout treatment session.    Home Living                          Prior Function            PT Goals (current goals can now be found in the care plan section) Acute Rehab PT Goals Patient Stated Goal: to improve strength and walking PT Goal Formulation: With patient Time For Goal Achievement: 03/05/23 Potential to Achieve Goals: Good Progress towards PT goals: Progressing toward goals    Frequency    Min 1X/week      PT Plan      Co-evaluation              AM-PAC PT "6 Clicks" Mobility   Outcome Measure  Help needed turning from your back to your side while in a flat bed without using bedrails?: A Little Help needed moving from lying on your back to sitting on the side of a flat bed without using bedrails?: A Lot Help needed moving to and from a bed to a chair (including a wheelchair)?: A Little Help needed standing up from a chair using your arms (e.g., wheelchair or bedside chair)?: A Lot Help needed to walk in hospital room?: A Little Help needed climbing 3-5 steps with a railing? : A Lot 6 Click Score: 15    End of Session Equipment Utilized During Treatment: Gait belt Activity Tolerance: Patient tolerated treatment well Patient left: in bed;with call bell/phone within reach;with bed alarm set;Other (comment)  (B heels floating) Nurse Communication: Mobility status;Precautions;Patient requests pain meds PT Visit Diagnosis: Other abnormalities of gait and mobility (R26.89);Muscle weakness (generalized) (M62.81);History of falling (Z91.81);Pain Pain - part of body:  (low back)     Time: 1030-1047 PT Time Calculation (min) (ACUTE ONLY): 17 min  Charges:    $Therapeutic Activity: 8-22 mins PT General Charges $$ ACUTE PT VISIT: 1 Visit                     Hendricks Limes, PT 02/22/23, 1:19 PM

## 2023-02-22 NOTE — Plan of Care (Signed)

## 2023-02-22 NOTE — TOC Progression Note (Addendum)
Transition of Care [facility]) - Progression Note    Patient Details  Name: Robin Espinoza MRN: [id number] Date of Birth: 1945-04-26  [facility]) CM/SW Contact  Margarito Liner, LCSW Phone Number: 02/22/2023, 10:39 AM  Clinical Narrative:   No bed offers this morning. CSW left messages for [facility] and [place] asking them to review. CSW also extended search to other SNF's in the area.  10:46 am: [facility] is unable to offer a bed.  12:29 pm: Patient has accepted bed offer from [facility]. CSW left a voicemail for [facility]. Will start insurance authorization when they call back.  1:46 pm: [employer] with Judeth Cornfield at [facility].  Expected Discharge Plan and Services                                               Social Determinants of Health (SDOH) Interventions SDOH Screenings   Food Insecurity: No Food Insecurity (02/18/2023)  Housing: Low Risk  (02/18/2023)  Transportation Needs: No Transportation Needs (02/18/2023)  Utilities: Not At Risk (02/18/2023)  Alcohol Screen: Low Risk  (09/12/2021)  Depression (PHQ2-9): Medium Risk (09/12/2021)  Financial Resource Strain: Low Risk  (08/31/2022)   Received from [facility], [facility]  Physical Activity: Inactive (04/04/2020)  Social Connections: Socially Integrated (04/04/2020)  Stress: No Stress Concern Present (04/04/2020)  Tobacco Use: Medium Risk (02/17/2023)    Readmission Risk Interventions     No data to display

## 2023-02-22 NOTE — Progress Notes (Signed)
PROGRESS NOTE    Robin Espinoza   ZOX:[id number] DOB: July 12, 1945  DOA: 02/17/2023 Date of Service: 02/22/23 which is hospital day 0 (Observation status)  PCP: Lauro Regulus, MD    HPI: Robin Espinoza is a 77 y.o. female with medical history significant of osteoarthritis, frequent falls uses a walker to ambulate, chronic A-fib on anticoagulation, hypertension, hypothyroidism, presenting to the ED with generalized weakness after a fall.     Hospital course / significant events:  11/27: admitted to hospitalist service w/ weakness/fall resulting in C6-C7 ventral osteophyte fracture, neurosurgery, Dr. Adriana Simas recommended Aspen neck collar and MRI of the C-spine.  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 PT/OT, SNF rehab   Hypokalemia - resolved Replace as needed Monitor BMP periodically    Hypothyroidism - stable cont home synthroid   Chronic A-fib - stable  movement artifacts sometimes causing the appearance of RVR, but pulse check confirmed rate controlled. cont amiodarone and Toprol cont Eliquis   Essential Hypertension - stable  BP currently intermittently soft Adjust medications as needed   Dehydration - resolved s/p IVF Monitor Encourage po intake    GERD - stable  PPI.   Leukocytosis -  resolved  Likely reactive leukocytosis. normalized without abx Monitor periodic CBC / follow outpatient    Depression/anxiety - stable cont home regimen of Abilify, Cymbalta.   Hyperlipidemia - stable Continue statin.   History of chronic constipation - stable  cont home regimen Senokot-S.  MiraLAX as needed.     obesity based on BMI: Body mass index is 37.03 kg/m.  Underweight - under 18.5  normal weight - 18.5 to 24.9 overweight - 25 to 29.9 obese - 30 or more   DVT prophylaxis: eliquis  IV fluids: no continuous IV fluids  Nutrition: cardiac diet  Central lines / invasive devices: none  Code Status: DNR ACP documentation reviewed: 02/21/23 and none on file in [facility]  [facility] needs: SNF rehab placement Barriers to dispo / significant pending items: placement              Subjective / Brief ROS:  Patient reports doing well today Still feeling generally weak and worried about falling - needing significant help to get up out of chair  Denies CP/SOB.  Pain controlled.  Denies new weakness.  Tolerating diet.  Reports no concerns w/ urination/defecation.   Family Communication: none at this time    Objective Findings:  Vitals:   02/22/23 0025 02/22/23 0425 02/22/23 0913 02/22/23 1158  BP: (!) 140/74 139/72 (!) 133/92 133/88  Pulse: 91 96 64 (!) 58  Resp: 18 18 18    Temp: 97.9 F (36.6 C) 97.7 F (36.5 C) 97.8 F (36.6 C) 97.6 F (36.4 C)  TempSrc: Oral Oral    SpO2: 92% 92% 100% 100%  Weight:      Height:        Intake/Output Summary (Last 24 hours) at 02/22/2023 1232 Last data filed at 02/22/2023 0800 Gross per 24 hour  Intake 480 ml  Output 1200 ml  Net -720 ml   Filed Weights   02/19/23 0515 02/21/23 0400  Weight: 103.2 kg 100.9 kg    Examination:  Physical Exam Constitutional:      General: She is not in acute distress. Cardiovascular:     Rate and Rhythm: Normal rate and regular rhythm.  Pulmonary:     Effort: Pulmonary effort  is normal.     Breath sounds: Normal breath sounds.  Abdominal:     Palpations: Abdomen is soft.  Musculoskeletal:     Right lower leg: No edema.     Left lower leg: No edema.  Skin:    General: Skin is warm and dry.  Neurological:     General: No focal deficit present.     Mental Status: She is alert. Mental status is at baseline.  Psychiatric:        Mood and Affect: Mood normal.        Behavior: Behavior normal.          Scheduled Medications:   amiodarone  200 mg Oral Daily   apixaban  5 mg Oral BID   ARIPiprazole  2 mg Oral Daily   docusate sodium  100 mg Oral BID   DULoxetine  60 mg Oral Daily   gabapentin  200 mg Oral BID   levothyroxine  50 mcg Oral Q0600   metoprolol succinate  50 mg Oral Daily   pantoprazole  40 mg Oral Daily   potassium chloride  40 mEq Oral Daily   pravastatin  40 mg Oral q1800   senna  2 tablet Oral QHS   sodium chloride flush  3 mL Intravenous Q12H    Continuous Infusions:   PRN Medications:  acetaminophen **OR** acetaminophen, hydrALAZINE, ketorolac, levalbuterol, methocarbamol (ROBAXIN) injection, ondansetron **OR** ondansetron (ZOFRAN) IV, polyethylene glycol, polyvinyl alcohol  Antimicrobials from admission:  Anti-infectives (From admission, onward)    None           Data Reviewed:  I have personally reviewed the following...  CBC: Recent Labs  Lab 02/17/23 1537 02/18/23 0511 02/19/23 0414 02/20/23 0249  WBC 11.2* 9.4 6.8 6.7  HGB 13.3 11.8* 11.2* 11.1*  HCT 39.3 36.3 33.0* 33.4*  MCV 97.8 100.8* 97.1 99.7  PLT 176 149* 144* 152   Basic Metabolic Panel: Recent Labs  Lab 02/17/23 1537 02/17/23 2221 02/18/23 0511 02/19/23 0414 02/20/23 0249 02/21/23 0343 02/22/23 0451  NA 135  --  138 135 134*  --   --   K 3.1*  --  3.2* 3.3* 3.2* 4.0 4.2  CL 90*  --  92* 93* 95*  --   --   CO2 33*  --  32 32 29  --   --   GLUCOSE 100*  --  95 98 107*  --   --   BUN 27*  --  25* 23 17  --   --   CREATININE 1.11*   --  0.97 1.09* 0.76  --   --   CALCIUM 8.7*  --  8.5* 8.2* 8.0*  --   --   MG  --  1.8 2.0 2.1 2.5*  --   --    GFR: Estimated Creatinine Clearance: 68.3 mL/min (by C-G formula based on SCr of 0.76 mg/dL).  Liver Function Tests: Recent Labs  Lab 02/18/23 0511  AST 20  ALT 9  ALKPHOS 93  BILITOT 1.0  PROT 6.1*  ALBUMIN 3.3*   No results for input(s): "LIPASE", "AMYLASE" in the last 168 hours. No results for input(s): "AMMONIA" in the last 168 hours. Coagulation Profile: No results for input(s): "INR", "PROTIME" in the last 168 hours. Cardiac Enzymes: No results for input(s): "CKTOTAL", "CKMB", "CKMBINDEX", "TROPONINI" in the last 168 hours. BNP (last 3 results) No results for input(s): "PROBNP" in the last 8760 hours. HbA1C: No results for input(s): "HGBA1C" in the last 72 hours. CBG: Recent Labs  Lab 02/20/23 0727 02/20/23 1148 02/21/23 0741 02/22/23 0913 02/22/23 1200  GLUCAP 97 85 69* 99 92   Lipid Profile: No results for input(s): "CHOL", "HDL", "LDLCALC", "TRIG", "CHOLHDL", "LDLDIRECT" in the last 72 hours. Thyroid Function Tests: No results for input(s): "TSH", "T4TOTAL", "FREET4", "T3FREE", "THYROIDAB" in the last 72 hours. Anemia Panel: No results for input(s): "VITAMINB12", "FOLATE", "FERRITIN", "TIBC", "IRON", "RETICCTPCT" in the last 72 hours. Most Recent Urinalysis On File:     Component Value Date/Time   COLORURINE YELLOW (A) 02/17/2023 1833   APPEARANCEUR CLEAR (A) 02/17/2023 1833   APPEARANCEUR Hazy (A) 06/22/2022 1446   LABSPEC 1.008 02/17/2023 1833   LABSPEC 1.020 05/02/2012 1036   PHURINE 6.0 02/17/2023 1833   GLUCOSEU NEGATIVE 02/17/2023 1833   GLUCOSEU Negative 05/02/2012 1036   HGBUR Oregel (A) 02/17/2023 1833   BILIRUBINUR NEGATIVE 02/17/2023 1833   BILIRUBINUR Negative 06/22/2022 1446   BILIRUBINUR Negative 05/02/2012 1036   KETONESUR NEGATIVE 02/17/2023 1833   PROTEINUR NEGATIVE 02/17/2023 1833   UROBILINOGEN 0.2 08/22/2019 1605    NITRITE NEGATIVE 02/17/2023 1833   LEUKOCYTESUR NEGATIVE 02/17/2023 1833   LEUKOCYTESUR 3+ 05/02/2012 1036   Sepsis Labs: @LABRCNTIP (procalcitonin:4,lacticidven:4) Microbiology: No results found for this or any previous visit (from the past 240 hour(s)).    Radiology Studies last 3 days: DG Cervical Spine 2 or 3 views  Result Date: 02/20/2023 CLINICAL DATA:  Neck pain EXAM: CERVICAL SPINE - 2-3 VIEW COMPARISON:  02/17/2023 FINDINGS: Lateral flexion and extension radiographs of the cervical spine demonstrate 3 mm of retrolisthesis of C3 on C4 on extension which reduces upon flexion. Anterior endplate osteophyte fracture at the C6-7 level seen on prior CT is obscured on this examination. No new fractures are seen. No prevertebral soft tissue swelling. IMPRESSION: 1. There is 3 mm of retrolisthesis of C3 on C4 on extension which reduces upon flexion. 2. Anterior endplate osteophyte fracture at the C6-7 level seen on prior CT is obscured on this examination. Electronically Signed   By: Duanne Guess D.O.   On: 02/20/2023 14:06       Sunnie Nielsen, DO [facility] 02/22/2023, 12:32 PM    Dictation software may have been used to generate the above note. Typos may occur and escape review in typed/dictated notes. Please contact Dr Lyn Hollingshead directly for clarity if needed.  Staff may message me via secure chat in Epic  but this may not receive an immediate response,  please page me for urgent matters!  If 7PM-7AM, please contact night coverage [email or web address]

## 2023-02-23 ENCOUNTER — Telehealth: Payer: Self-pay

## 2023-02-23 DIAGNOSIS — R531 Weakness: Secondary | ICD-10-CM | POA: Diagnosis not present

## 2023-02-23 LAB — GLUCOSE, CAPILLARY
Glucose-Capillary: 90 mg/dL (ref 70–99)
Glucose-Capillary: 79 mg/dL (ref 70–99)

## 2023-02-23 MED ORDER — NYSTATIN 100000 UNIT/GM EX CREA
TOPICAL_CREAM | Freq: Two times a day (BID) | CUTANEOUS | Status: DC
  Filled 2023-02-23: qty 30

## 2023-02-23 NOTE — TOC Progression Note (Addendum)
Transition of Care [facility]) - Progression Note    Patient Details  Name: Robin Espinoza MRN: [id number] Date of Birth: 02/04/1946  [facility]) CM/SW Contact  Liliana Cline, LCSW Phone Number: 02/23/2023, 10:20 AM  Clinical Narrative:    Called [facility] to check status of insurance auth, left a VM requesting a return call.  1:55- Spoke to Grandin at [facility], requested update on auth. Berkley Harvey is still pending.      Expected Discharge Plan and Services                                               Social Determinants of Health (SDOH) Interventions SDOH Screenings   Food Insecurity: No Food Insecurity (02/18/2023)  Housing: Low Risk  (02/18/2023)  Transportation Needs: No Transportation Needs (02/18/2023)  Utilities: Not At Risk (02/18/2023)  Alcohol Screen: Low Risk  (09/12/2021)  Depression (PHQ2-9): Medium Risk (09/12/2021)  Financial Resource Strain: Low Risk  (08/31/2022)   Received from [facility], [facility]  Physical Activity: Inactive (04/04/2020)  Social Connections: Socially Integrated (04/04/2020)  Stress: No Stress Concern Present (04/04/2020)  Tobacco Use: Medium Risk (02/17/2023)    Readmission Risk Interventions     No data to display

## 2023-02-23 NOTE — Progress Notes (Signed)
Physical Therapy Treatment Patient Details Name: THREASE BATTERSON MRN: [id number] DOB: Jun 12, 1945 Today's Date: 02/23/2023   History of Present Illness Pt is a 77 y.o. female presenting to hospital 02/17/23 with c/o fall (legs gave out); c/o neck pain.  Imaging showing "Minimally displaced fracture through a C6-C7 ventral osteophyte, new from the prior cervical spine CT of 01/11/2023 and likely acute".  Pt admitted with generalized weakness, inability to ambulate, frequent falls, C6-7 osteophyte fx, hypokalemia.Marland Kitchen  PMH includes OA, h/o frequent falls, chronic a-fib, htn, HLD, OSA, R TKA.  H/o falls.    PT Comments  Pt progressing well with increased confidence and activity tolerance today.  PT co-treated with OT this session to encourage confidence with safety during mobility.  Pt performed transfers, functional standing balance, and gait (115') all with RW for support, cues for safe technique with CGA +1.  Continued PT will assist pt towards greater  balance and safety with functional mobility.   If plan is discharge home, recommend the following: A little help with walking and/or transfers;A little help with bathing/dressing/bathroom;Assistance with cooking/housework;Assist for transportation;Help with stairs or ramp for entrance   Can travel by private vehicle     Yes  Equipment Recommendations  Rolling walker (2 wheels);BSC/3in1    Recommendations for Other Services       Precautions / Restrictions Precautions Precautions: Fall Precaution Comments: Per neurosurgery note 02/20/23: "she can wear the collar for comfort at this time.  She should avoid any falls or other extraneous movements of the neck" Required Braces or Orthoses: Cervical Brace Cervical Brace: Hard collar Restrictions Weight Bearing Restrictions: No     Mobility  Bed Mobility Overal bed mobility: Needs Assistance             General bed mobility comments: NT, Pt up in recliner    Transfers Overall transfer  level: Needs assistance Equipment used: Rolling walker (2 wheels) Transfers: Sit to/from Stand, Bed to chair/wheelchair/BSC Sit to Stand: Contact guard assist, From elevated surface   Step pivot transfers: Contact guard assist       General transfer comment: cues for keeping walker "close" when turning to sit and for hand placement.  Only one person assist needed.    Ambulation/Gait Ambulation/Gait assistance: Contact guard assist Gait Distance (Feet): 115 Feet Assistive device: Rolling walker (2 wheels) Gait Pattern/deviations: Step-through pattern, Decreased step length - right, Decreased step length - left Gait velocity: decreased     General Gait Details: cues to "stop and reset" so pt could keep walker sufficiently close during gait (multiple times)   Stairs             Wheelchair Mobility     Tilt Bed    Modified Rankin (Stroke Patients Only)       Balance Overall balance assessment: Needs assistance Sitting-balance support: No upper extremity supported, Feet supported Sitting balance-Leahy Scale: Good Sitting balance - Comments: steady reaching within BOS   Standing balance support: During functional activity, Reliant on assistive device for balance, Bilateral upper extremity supported, No upper extremity supported Standing balance-Leahy Scale: Good Standing balance comment: performed low level functional task within BOS without UEs on walker for support.                            Cognition Arousal: Alert Behavior During Therapy: WFL for tasks assessed/performed Overall Cognitive Status: Within Functional Limits for tasks assessed  General Comments: Pt was cautious but a lot less anxious about mobility today.        Exercises      General Comments        Pertinent Vitals/Pain Pain Assessment Pain Assessment: No/denies pain Pain Location: neck Pain Descriptors / Indicators:  Aching Pain Intervention(s): Monitored during session    Home Living                          Prior Function            PT Goals (current goals can now be found in the care plan section) Acute Rehab PT Goals Patient Stated Goal: to improve strength and walking PT Goal Formulation: With patient Time For Goal Achievement: 03/05/23 Potential to Achieve Goals: Good Progress towards PT goals: Progressing toward goals    Frequency    Min 1X/week      PT Plan      Co-evaluation PT/OT/SLP Co-Evaluation/Treatment: Yes Reason for Co-Treatment: For patient/therapist safety PT goals addressed during session: Mobility/safety with mobility        AM-PAC PT "6 Clicks" Mobility   Outcome Measure  Help needed turning from your back to your side while in a flat bed without using bedrails?: A Little Help needed moving from lying on your back to sitting on the side of a flat bed without using bedrails?: A Lot Help needed moving to and from a bed to a chair (including a wheelchair)?: A Little Help needed standing up from a chair using your arms (e.g., wheelchair or bedside chair)?: A Little Help needed to walk in hospital room?: A Little Help needed climbing 3-5 steps with a railing? : A Lot 6 Click Score: 16    End of Session Equipment Utilized During Treatment: Gait belt Activity Tolerance: Patient tolerated treatment well Patient left: with call bell/phone within reach;in chair;with chair alarm set Nurse Communication: Mobility status PT Visit Diagnosis: Other abnormalities of gait and mobility (R26.89);Muscle weakness (generalized) (M62.81);History of falling (Z91.81);Pain     Time: 1138-1202 PT Time Calculation (min) (ACUTE ONLY): 24 min  Charges:    $Gait Training: 8-22 mins PT General Charges $$ ACUTE PT VISIT: 1 Visit (Cot-treat with OT)                     Hortencia Conradi, PTA  02/23/23, 12:23 PM

## 2023-02-23 NOTE — Progress Notes (Signed)
PROGRESS NOTE    Robin Espinoza   HYQ:[id number] DOB: [birth year]  DOA: 02/17/2023 Date of Service: 02/23/23 which is hospital day 0 (Observation status)  PCP: Lauro Regulus, MD    HPI: Robin Espinoza is a 77 y.o. female with medical history significant of osteoarthritis, frequent falls uses a walker to ambulate, chronic A-fib on anticoagulation, hypertension, hypothyroidism, presenting to the ED with generalized weakness after a fall.     Hospital course / significant events:  11/27: admitted to hospitalist service w/ weakness/fall resulting in C6-C7 ventral osteophyte fracture, neurosurgery, Dr. Adriana Simas recommended Aspen neck collar and MRI of the C-spine.  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 PT/OT, SNF rehab   Hypokalemia - resolved Replace as needed Monitor BMP periodically    Hypothyroidism - stable cont home synthroid   Chronic A-fib - stable  movement artifacts sometimes causing the appearance of RVR, but pulse check confirmed rate controlled. cont amiodarone and Toprol cont Eliquis   Essential Hypertension - stable  BP currently intermittently soft Adjust medications as needed   Dehydration - resolved s/p IVF Monitor Encourage po intake    GERD - stable  PPI.   Leukocytosis -  resolved  Likely reactive leukocytosis. normalized without abx Monitor periodic CBC / follow outpatient    Depression/anxiety - stable cont home regimen of Abilify, Cymbalta.   Hyperlipidemia - stable Continue statin.   History of chronic constipation - stable  cont home regimen Senokot-S.  MiraLAX as needed.     obesity based on BMI: Body mass index is 37.03 kg/m.  Underweight - under 18.5  normal weight - 18.5 to 24.9 overweight - 25 to 29.9 obese - 30 or more   DVT prophylaxis: eliquis  IV fluids: no continuous IV fluids  Nutrition: cardiac diet  Central lines / invasive devices: none  Code Status: DNR ACP documentation reviewed: 02/21/23 and none on file in [facility]  [facility] needs: SNF rehab placement Barriers to dispo / significant pending items: placement - [facility] is still pending as of afternoon 12/03             Subjective / Brief ROS:  Patient reports doing well today Still feeling generally weak and worried about falling - needing significant help to get up out of chair  Denies CP/SOB.  Pain controlled.  Denies new weakness.  Tolerating diet.  Reports no concerns w/ urination/defecation.   Family Communication: none at this time    Objective Findings:  Vitals:   02/23/23 0345 02/23/23 0746 02/23/23 1141 02/23/23 1517  BP: (!) 140/69 114/71 (!) 155/88 (!) 125/90  Pulse: 63 64 62 66  Resp: 18 16 16 17   Temp: 97.8 F (36.6 C) 97.6 F (36.4 C) 97.9 F (36.6 C) 98.3 F (36.8 C)  TempSrc: Oral Oral Oral  Axillary  SpO2: 100% 96% 99% 100%  Weight:      Height:        Intake/Output Summary (Last 24 hours) at 02/23/2023 1613 Last data filed at 02/23/2023 0600 Gross per 24 hour  Intake 720 ml  Output --  Net 720 ml   Filed Weights   02/19/23 0515 02/21/23 0400  Weight: 103.2 kg 100.9 kg    Examination:  Physical Exam Constitutional:      General: She is not in acute distress. Cardiovascular:     Rate and Rhythm: Normal rate and regular  rhythm.  Pulmonary:     Effort: Pulmonary effort is normal.     Breath sounds: Normal breath sounds.  Abdominal:     Palpations: Abdomen is soft.  Musculoskeletal:     Right lower leg: No edema.     Left lower leg: No edema.  Skin:    General: Skin is warm and dry.  Neurological:     General: No focal deficit present.     Mental Status: She is alert. Mental status is at baseline.  Psychiatric:        Mood and Affect: Mood normal.        Behavior: Behavior normal.          Scheduled Medications:   amiodarone  200 mg Oral Daily   apixaban  5 mg Oral BID   ARIPiprazole  2 mg Oral Daily   docusate sodium  100 mg Oral BID   DULoxetine  60 mg Oral Daily   gabapentin  200 mg Oral BID   levothyroxine  50 mcg Oral Q0600   metoprolol succinate  50 mg Oral Daily   nystatin cream   Topical BID   pantoprazole  40 mg Oral Daily   potassium chloride  40 mEq Oral Daily   pravastatin  40 mg Oral q1800   senna  2 tablet Oral QHS   sodium chloride flush  3 mL Intravenous Q12H    Continuous Infusions:   PRN Medications:  acetaminophen **OR** acetaminophen, hydrALAZINE, levalbuterol, methocarbamol (ROBAXIN) injection, ondansetron **OR** ondansetron (ZOFRAN) IV, polyethylene glycol, polyvinyl alcohol  Antimicrobials from admission:  Anti-infectives (From admission, onward)    None           Data Reviewed:  I have personally reviewed the following...  CBC: Recent Labs  Lab 02/17/23 1537 02/18/23 0511 02/19/23 0414 02/20/23 0249  WBC 11.2* 9.4 6.8 6.7  HGB 13.3 11.8* 11.2* 11.1*  HCT 39.3 36.3 33.0* 33.4*  MCV 97.8 100.8* 97.1 99.7  PLT 176 149* 144* 152   Basic Metabolic Panel: Recent Labs  Lab 02/17/23 1537 02/17/23 2221 02/18/23 0511 02/19/23 0414 02/20/23 0249 02/21/23 0343 02/22/23 0451  NA 135  --  138 135 134*  --   --   K 3.1*  --  3.2* 3.3* 3.2* 4.0 4.2  CL 90*  --  92* 93* 95*  --   --   CO2 33*  --  32 32 29  --   --   GLUCOSE 100*  --  95  98 107*  --   --   BUN 27*  --  25* 23 17  --   --   CREATININE 1.11*  --  0.97 1.09* 0.76  --   --   CALCIUM 8.7*  --  8.5* 8.2* 8.0*  --   --   MG  --  1.8 2.0 2.1 2.5*  --   --    GFR:  Estimated Creatinine Clearance: 68.3 mL/min (by C-G formula based on SCr of 0.76 mg/dL). Liver Function Tests: Recent Labs  Lab 02/18/23 0511  AST 20  ALT 9  ALKPHOS 93  BILITOT 1.0  PROT 6.1*  ALBUMIN 3.3*   No results for input(s): "LIPASE", "AMYLASE" in the last 168 hours. No results for input(s): "AMMONIA" in the last 168 hours. Coagulation Profile: No results for input(s): "INR", "PROTIME" in the last 168 hours. Cardiac Enzymes: No results for input(s): "CKTOTAL", "CKMB", "CKMBINDEX", "TROPONINI" in the last 168 hours. BNP (last 3 results) No results for input(s): "PROBNP" in the last 8760 hours. HbA1C: No results for input(s): "HGBA1C" in the last 72 hours. CBG: Recent Labs  Lab 02/22/23 0913 02/22/23 1200 02/22/23 1546 02/23/23 0745 02/23/23 1142  GLUCAP 99 92 137* 90 79   Lipid Profile: No results for input(s): "CHOL", "HDL", "LDLCALC", "TRIG", "CHOLHDL", "LDLDIRECT" in the last 72 hours. Thyroid Function Tests: No results for input(s): "TSH", "T4TOTAL", "FREET4", "T3FREE", "THYROIDAB" in the last 72 hours. Anemia Panel: No results for input(s): "VITAMINB12", "FOLATE", "FERRITIN", "TIBC", "IRON", "RETICCTPCT" in the last 72 hours. Most Recent Urinalysis On File:     Component Value Date/Time   COLORURINE YELLOW (A) 02/17/2023 1833   APPEARANCEUR CLEAR (A) 02/17/2023 1833   APPEARANCEUR Hazy (A) 06/22/2022 1446   LABSPEC 1.008 02/17/2023 1833   LABSPEC 1.020 05/02/2012 1036   PHURINE 6.0 02/17/2023 1833   GLUCOSEU NEGATIVE 02/17/2023 1833   GLUCOSEU Negative 05/02/2012 1036   HGBUR Browder (A) 02/17/2023 1833   BILIRUBINUR NEGATIVE 02/17/2023 1833   BILIRUBINUR Negative 06/22/2022 1446   BILIRUBINUR Negative 05/02/2012 1036   KETONESUR NEGATIVE 02/17/2023 1833    PROTEINUR NEGATIVE 02/17/2023 1833   UROBILINOGEN 0.2 08/22/2019 1605   NITRITE NEGATIVE 02/17/2023 1833   LEUKOCYTESUR NEGATIVE 02/17/2023 1833   LEUKOCYTESUR 3+ 05/02/2012 1036   Sepsis Labs: @LABRCNTIP (procalcitonin:4,lacticidven:4) Microbiology: No results found for this or any previous visit (from the past 240 hour(s)).    Radiology Studies last 3 days: DG Cervical Spine 2 or 3 views  Result Date: 02/20/2023 CLINICAL DATA:  Neck pain EXAM: CERVICAL SPINE - 2-3 VIEW COMPARISON:  02/17/2023 FINDINGS: Lateral flexion and extension radiographs of the cervical spine demonstrate 3 mm of retrolisthesis of C3 on C4 on extension which reduces upon flexion. Anterior endplate osteophyte fracture at the C6-7 level seen on prior CT is obscured on this examination. No new fractures are seen. No prevertebral soft tissue swelling. IMPRESSION: 1. There is 3 mm of retrolisthesis of C3 on C4 on extension which reduces upon flexion. 2. Anterior endplate osteophyte fracture at the C6-7 level seen on prior CT is obscured on this examination. Electronically Signed   By: Duanne Guess D.O.   On: 02/20/2023 14:06       Sunnie Nielsen, DO [facility] 02/23/2023, 4:13 PM    Dictation software may have been used to generate the above note. Typos may occur and escape review in typed/dictated notes. Please contact Dr Lyn Hollingshead directly for clarity if needed.  Staff may message me via secure chat in Epic  but this may not receive an immediate response,  please page me for urgent matters!  If 7PM-7AM, please contact night coverage [email or web address]

## 2023-02-23 NOTE — Telephone Encounter (Signed)
Dr Adriana Simas saw this patient as a consult on 02/17/23 while covering call for our department. Per Dr Adriana Simas: "Fall with Randal osteophyte fracture, wouldn't wear collar but flex ex stable. 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 Could use follow up in clinic  to discuss options"

## 2023-02-23 NOTE — Telephone Encounter (Signed)
Left message to call back  

## 2023-02-23 NOTE — Progress Notes (Addendum)
Occupational Therapy Treatment Patient Details Name: Robin Espinoza MRN: [id number] DOB: Jan 11, 1946 Today's Date: 02/23/2023   History of present illness Pt is a 77 y.o. female presenting to hospital 02/17/23 with c/o fall (legs gave out); c/o neck pain.  Imaging showing "Minimally displaced fracture through a C6-C7 ventral osteophyte, new from the prior cervical spine CT of 01/11/2023 and likely acute".  Pt admitted with generalized weakness, inability to ambulate, frequent falls, C6-7 osteophyte fx, hypokalemia.Marland Kitchen  PMH includes OA, h/o frequent falls, chronic a-fib, htn, HLD, OSA, R TKA.  H/o falls.   OT comments  Pt is seated in recliner on arrival. Pleasant and agreeable to PT/OT session to maximize mobility distance and safety d/t mult falls at home PTA. She reports 5/10 neck pain that resolved then moved to her lower back. Pt performed STS from recliner x2 with CGA and extra time to reach upright standing. Ambulated ~135 feet total (hall and to sink and back in room) with RW and CGA with close chair follow for safety d/t recent falls at home and fatigue. She demo ability to perform oral care standing at sink with CGA and periods of no UE support on sink/walker with no LOB. Pt edu on no extreme neck movements and compensatory strategies to minimize movement/strain on neck with pt verbalizing understanding.  Pt returned to recliner with all needs in place and will cont to require skilled acute OT services to maximize her safety and IND to return to PLOF.       If plan is discharge home, recommend the following:  A little help with walking and/or transfers;A lot of help with bathing/dressing/bathroom;Help with stairs or ramp for entrance;Assist for transportation;Assistance with cooking/housework   Equipment Recommendations  Other (comment) (defer)    Recommendations for Other Services      Precautions / Restrictions Precautions Precautions: Fall Precaution Comments: Per neurosurgery note  02/20/23: "she can wear the collar for comfort at this time.  She should avoid any falls or other extraneous movements of the neck" Required Braces or Orthoses: Cervical Brace Cervical Brace: Hard collar Restrictions Weight Bearing Restrictions: No       Mobility Bed Mobility               General bed mobility comments: NT, pt in recliner pre/post session    Transfers Overall transfer level: Needs assistance Equipment used: Rolling walker (2 wheels) Transfers: Sit to/from Stand, Bed to chair/wheelchair/BSC Sit to Stand: Contact guard assist, From elevated surface           General transfer comment: CGA for STS from recliner with increased time; able to ambulate ~180 feet throughout session using RW with chair follow in case of fatigue/mult falls at home; cueing for staying inside walker     Balance Overall balance assessment: Needs assistance Sitting-balance support: No upper extremity supported, Feet supported Sitting balance-Leahy Scale: Good     Standing balance support: During functional activity, Reliant on assistive device for balance, Bilateral upper extremity supported, No upper extremity supported Standing balance-Leahy Scale: Good Standing balance comment: oral care standing at sink within BOS without BUE support at times and no LOB                           ADL either performed or assessed with clinical judgement   ADL Overall ADL's : Needs assistance/impaired     Grooming: Oral care;Supervision/safety;Contact guard assist Grooming Details (indicate cue type and reason): CGA for balance  in standing at sink to perform oral care with set up of items                                    Extremity/Trunk Assessment         Cervical / Trunk Assessment Cervical / Trunk Assessment: Other exceptions Cervical / Trunk Exceptions: forward head and neck with c-collar on    Vision       Perception     Praxis      Cognition  Arousal: Alert Behavior During Therapy: [facility] for tasks assessed/performed Overall Cognitive Status: Within Functional Limits for tasks assessed                                 General Comments: Pt was cautious but a lot less anxious about mobility today.        Exercises Other Exercises Other Exercises: Edu on no extreme motions of the neck and bringing items closer to her, e.g. water cup and spit basin during oral care to prevent over stretching of the neck.    Shoulder Instructions       General Comments      Pertinent Vitals/ Pain       Pain Assessment Pain Assessment: 0-10 Pain Score: 5  Pain Location: neck and lower back Pain Descriptors / Indicators: Aching Pain Intervention(s): Monitored during session  Home Living                                          Prior Functioning/Environment              Frequency  Min 1X/week        Progress Toward Goals  OT Goals(current goals can now be found in the care plan section)  Progress towards OT goals: Progressing toward goals  Acute Rehab OT Goals Patient Stated Goal: improve strength OT Goal Formulation: With patient Time For Goal Achievement: 03/05/23 Potential to Achieve Goals: Good  Plan      Co-evaluation    PT/OT/SLP Co-Evaluation/Treatment: Yes Reason for Co-Treatment: For patient/therapist safety PT goals addressed during session: Mobility/safety with mobility OT goals addressed during session: ADL's and self-care      AM-PAC OT "6 Clicks" Daily Activity     Outcome Measure   Help from another person eating meals?: None Help from another person taking care of personal grooming?: A Little Help from another person toileting, which includes using toliet, bedpan, or urinal?: A Little Help from another person bathing (including washing, rinsing, drying)?: A Lot Help from another person to put on and taking off regular upper body clothing?: A Little Help from  another person to put on and taking off regular lower body clothing?: A Lot 6 Click Score: 17    End of Session Equipment Utilized During Treatment: Rolling walker (2 wheels)  OT Visit Diagnosis: Other abnormalities of gait and mobility (R26.89);Muscle weakness (generalized) (M62.81);Repeated falls (R29.6)   Activity Tolerance Patient tolerated treatment well   Patient Left in chair;with call bell/phone within reach;with chair alarm set   Nurse Communication Mobility status        Time: 6578-4696 OT Time Calculation (min): 28 min  Charges: OT General Charges $OT Visit: 1 Visit OT Treatments $Self Care/Home Management :  8-22 mins  [facility], OTR/L  02/23/23, 12:42 PM   Robin Espinoza 02/23/2023, 12:40 PM

## 2023-02-23 NOTE — Telephone Encounter (Signed)
Per discussion with Duwayne Heck, she will need a new patient appointment with an MD for early myelopathy and spinal stenosis (cervical, thoracic, and lumbar). She had xrays on 02/20/23 to clear her of her collar. Duwayne Heck said we can skip xrays at her next appointment and if new ones are needed at that time, she can get them on the way out.

## 2023-02-23 NOTE — Plan of Care (Signed)

## 2023-02-24 DIAGNOSIS — R531 Weakness: Secondary | ICD-10-CM | POA: Diagnosis not present

## 2023-02-24 DIAGNOSIS — G4733 Obstructive sleep apnea (adult) (pediatric): Secondary | ICD-10-CM | POA: Diagnosis not present

## 2023-02-24 LAB — BASIC METABOLIC PANEL
Anion gap: 8 (ref 5–15)
BUN: 17 mg/dL (ref 8–23)
CO2: 21 mmol/L — ABNORMAL LOW (ref 22–32)
Calcium: 8.1 mg/dL — ABNORMAL LOW (ref 8.9–10.3)
Chloride: 107 mmol/L (ref 98–111)
Creatinine, Ser: 0.82 mg/dL (ref 0.44–1.00)
GFR, Estimated: >60 mL/min (ref >60)
Glucose, Bld: 101 mg/dL — ABNORMAL HIGH (ref 70–99)
Potassium: 4.2 mmol/L (ref 3.5–5.1)
Sodium: 136 mmol/L (ref 135–145)

## 2023-02-24 LAB — CBC
HCT: 32.7 % — ABNORMAL LOW (ref 36.0–46.0)
Hemoglobin: 10.9 g/dL — ABNORMAL LOW (ref 12.0–15.0)
MCH: 33 pg (ref 26.0–34.0)
MCHC: 33.3 g/dL (ref 30.0–36.0)
MCV: 99.1 fL (ref 80.0–100.0)
Platelets: 226 10*3/uL (ref 150–400)
RBC: 3.3 MIL/uL — ABNORMAL LOW (ref 3.87–5.11)
RDW: 13.1 % (ref 11.5–15.5)
WBC: 6.1 10*3/uL (ref 4.0–10.5)
nRBC: 0 % (ref 0.0–0.2)

## 2023-02-24 LAB — GLUCOSE, CAPILLARY: Glucose-Capillary: 70 mg/dL (ref 70–99)

## 2023-02-24 MED ORDER — BISACODYL 5 MG PO TBEC: 10.0000 mg | DELAYED_RELEASE_TABLET | Freq: Every day | ORAL | 2 refills | Status: AC

## 2023-02-24 MED ORDER — POLYETHYLENE GLYCOL 3350 17 G PO PACK: 17.0000 g | PACK | Freq: Two times a day (BID) | ORAL | 2 refills | Status: AC

## 2023-02-24 MED ORDER — TRAZODONE HCL 50 MG PO TABS
50.0000 mg | ORAL_TABLET | Freq: Once | ORAL | Status: AC
  Administered 2023-02-24: 50 mg via ORAL
  Filled 2023-02-24: qty 1

## 2023-02-24 MED ORDER — CELECOXIB 100 MG PO CAPS: 100.0000 mg | ORAL_CAPSULE | Freq: Two times a day (BID) | ORAL | 2 refills | Status: AC

## 2023-02-24 NOTE — Discharge Summary (Signed)
[facility] Discharge Summary   Patient: Robin Espinoza VOZ:[id number]  PCP: Lauro Regulus, MD  Date of admission: 02/17/2023   Date of discharge: 02/24/2023     Discharge Diagnoses:  Principal Problem:   Generalized weakness Active Problems:   C6 cervical fracture (HCC)   Bilateral leg weakness   Falls frequently   Hypokalemia   Hypothyroidism   Anxiety disorder   Acid reflux   Obstructive apnea   Arthritis, degenerative   Depression, recurrent (HCC)   Chronic idiopathic constipation   Essential hypertension   Dyslipidemia   GERD without esophagitis   Muscle weakness (generalized)   Chronic atrial fibrillation (HCC)   Leukocytosis   Admitted From: Home Disposition:  Home with home health  Recommendations for Outpatient Follow-up:  Follow-up with PCP in 1 week Follow-up with neurosurgery in 1 to 2 weeks Follow-up with psych in 1 to 2 weeks to verify medications Follow up LABS/TEST:     Contact information for follow-up providers     Lauro Regulus, MD Follow up in 1 week(s).   Specialty: Internal Medicine Contact information: [address] [facility] [place] [address] [phone]              Contact information for after-discharge care     Destination     [facility] .   Service: Skilled Nursing Contact information: [address] [place] [address] [phone]                    Diet recommendation: Cardiac diet  Activity: The patient is advised to gradually reintroduce usual activities, as tolerated  Discharge Condition: stable  Code Status: Full code   History of present illness: As per the H and P dictated on admission Hospital Course:  Robin Espinoza is a 77 y.o. female with medical history significant of osteoarthritis, frequent falls uses a walker to ambulate, chronic A-fib on  anticoagulation, hypertension, hypothyroidism, presenting to the ED with generalized weakness after a fall.    Hospital course / significant events:  11/27: admitted to hospitalist service w/ weakness/fall resulting in C6-C7 ventral osteophyte fracture, neurosurgery, Dr. Adriana Simas recommended Aspen neck collar and MRI of the C-spine.  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 PT/OT, SNF rehab placement but insurance denied so patient decided to go home with home PT which was arranged by case manager. # Hypokalemia - resolved # Hypothyroidism - stable, cont home synthroid # Chronic A-fib - stabl. movement artifacts sometimes causing the appearance of RVR, but pulse check confirmed rate controlled. cont amiodarone and Toprol and cont Eliquis # Essential Hypertension - stable, resume home medications.  Patient  was advised to monitor BP at home and follow with PCP to titrate medication accordingly. # Dehydration - resolved, s/p IVF, Encourage po intake  # GERD - stable, PPI. # Leukocytosis - resolved, Likely reactive leukocytosis. normalized without abx # Depression/anxiety - stable, cont home regimen of Abilify, Cymbalta. # Hyperlipidemia - stable, Continue statin. # History of  chronic constipation - cont home regimen Senokot-S.  MiraLAX as needed. # Obesity class II Body mass index is 37.05 kg/m.  Nutrition Interventions: Calorie restricted diet and daily exercise advised to lose body weight.  Lifestyle modification discussed.   Pressure Injury 02/20/23 Sacrum Mid Stage 1 -  Intact skin with non-blanchable redness of a localized area usually over a bony prominence. (Active)  02/20/23 2000  Location: Sacrum  Location Orientation: Mid  Staging: Stage 1 -  Intact skin with non-blanchable redness of a localized area usually over a bony prominence.  Wound Description (Comments):   Present on Admission:   Dressing Type Foam - Lift dressing to assess site every shift 02/23/23 2131    - Patient was instructed, not to drive, operate heavy machinery, perform activities at heights, swimming or participation in water activities or provide baby sitting services while on Pain, Sleep and Anxiety Medications; until her outpatient Physician has advised to do so again.  - Also recommended to not to take more than prescribed Pain, Sleep and Anxiety Medications.  Patient was seen by physical therapy, who recommended Therapy, SNF placement but it was denied by insurance.  Home health was arranged. On the day of the discharge the patient's vitals were stable, and no other acute medical condition were reported by patient. the patient was felt safe to be discharge at Home with Home health.  Consultants:  Neurosurgery - Dr Adriana Simas Procedures: None  Discharge Exam: General: Appear in no distress, no Rash; Oral Mucosa Clear, moist. Cardiovascular: S1 and S2 Present, no Murmur, Respiratory: normal respiratory effort, Bilateral Air entry present and no Crackles, no wheezes Abdomen: Bowel Sound present, Soft and no tenderness, no hernia Extremities: no Pedal edema, no calf tenderness Neurology: alert and oriented to time, place, and person affect appropriate.  Filed Weights   02/19/23  0515 02/21/23 0400 02/24/23 0421  Weight: 103.2 kg 100.9 kg 101 kg   Vitals:   02/24/23 0855 02/24/23 1309  BP: (!) 135/90 (!) 147/82  Pulse: 63 61  Resp: 20 20  Temp: 98 F (36.7 C) (!) 97.4 F (36.3 C)  SpO2: 100% 100%    DISCHARGE MEDICATION: Allergies as of 02/24/2023       Reactions   Oxycodone Other (See Comments)   hallucinated   Amlodipine Swelling   Patient has been tolerating low dose        Medication List     STOP taking these medications    cyanocobalamin 1000 MCG tablet   docusate sodium 100 MG capsule Commonly known as: COLACE   furosemide 40 MG tablet Commonly known as: LASIX   senna 8.6 MG tablet Commonly known as: SENOKOT       TAKE these medications    acetaminophen 650 MG CR tablet Commonly known as: TYLENOL Take 1,300 mg by mouth every 8 (eight) hours as needed for pain.   amiodarone 200 MG tablet Commonly known as: PACERONE Take 1 tablet (200 mg total) by mouth daily.   apixaban 5 MG Tabs tablet Commonly known as: ELIQUIS Take 1 tablet (5 mg total) by mouth 2 (two) times daily.   ARIPiprazole 2 MG tablet  Commonly known as: ABILIFY Take 1 tablet (2 mg total) by mouth daily after breakfast.   bisacodyl 5 MG EC tablet Commonly known as: Dulcolax Take 2 tablets (10 mg total) by mouth at bedtime.   busPIRone 15 MG tablet Commonly known as: BUSPAR Take 15 mg by mouth at bedtime.   celecoxib 100 MG capsule Commonly known as: CELEBREX Take 1 capsule (100 mg total) by mouth 2 (two) times daily. What changed:  medication strength how much to take   DULoxetine 60 MG capsule Commonly known as: CYMBALTA Take 1 tablet by mouth daily.   gabapentin 100 MG capsule Commonly known as: NEURONTIN Take 2 capsules (200 mg total) by mouth 2 (two) times daily.   levothyroxine 50 MCG tablet Commonly known as: SYNTHROID Take 1 tablet (50 mcg total) by mouth daily at 6 (six) AM. What changed: Another medication with the same name was  removed. Continue taking this medication, and follow the directions you see here.   lovastatin 40 MG tablet Commonly known as: MEVACOR Take 1 tablet (40 mg total) by mouth at bedtime.   metoprolol succinate 50 MG 24 hr tablet Commonly known as: TOPROL-XL Take 1 tablet (50 mg total) by mouth daily. Take with or immediately following a meal.   NON FORMULARY CPAP   nystatin cream Commonly known as: MYCOSTATIN Apply 1 Application topically 2 (two) times daily as needed.   omeprazole 20 MG capsule Commonly known as: PRILOSEC TAKE 1 CAPSULE BY MOUTH EVERY DAY   polyethylene glycol 17 g packet Commonly known as: MIRALAX / GLYCOLAX Take 17 g by mouth 2 (two) times daily. What changed:  when to take this reasons to take this   potassium chloride 10 MEQ tablet Commonly known as: KLOR-CON Take 10 mEq by mouth daily.   THERATEARS OP Place 1 drop into both eyes daily as needed (dry eyes).   torsemide 100 MG tablet Commonly known as: DEMADEX Take 100 mg by mouth daily.   traZODone 100 MG tablet Commonly known as: DESYREL Take 1 tablet (100 mg total) by mouth daily. What changed: Another medication with the same name was removed. Continue taking this medication, and follow the directions you see here.               Discharge Care Instructions  (From admission, onward)           Start     Ordered   02/24/23 0000  Discharge wound care:       Comments: As above   02/24/23 1505           Allergies  Allergen Reactions   Oxycodone Other (See Comments)    hallucinated   Amlodipine Swelling    Patient has been tolerating low dose   Discharge Instructions     Call MD for:  difficulty breathing, headache or visual disturbances   Complete by: As directed    Call MD for:  extreme fatigue   Complete by: As directed    Call MD for:  persistant dizziness or light-headedness   Complete by: As directed    Call MD for:  persistant nausea and vomiting   Complete by:  As directed    Call MD for:  severe uncontrolled pain   Complete by: As directed    Call MD for:  temperature >100.4   Complete by: As directed    Diet - low sodium heart healthy   Complete by: As directed    Discharge instructions   Complete  by: As directed    Follow-up with PCP in 1 week Follow-up with psych in 1 to 2 weeks to verify medications   Discharge wound care:   Complete by: As directed    As above   Increase activity slowly   Complete by: As directed        The results of significant diagnostics from this hospitalization (including imaging, microbiology, ancillary and laboratory) are listed below for reference.    Significant Diagnostic Studies: DG Cervical Spine 2 or 3 views  Result Date: 02/20/2023 CLINICAL DATA:  Neck pain EXAM: CERVICAL SPINE - 2-3 VIEW COMPARISON:  02/17/2023 FINDINGS: Lateral flexion and extension radiographs of the cervical spine demonstrate 3 mm of retrolisthesis of C3 on C4 on extension which reduces upon flexion. Anterior endplate osteophyte fracture at the C6-7 level seen on prior CT is obscured on this examination. No new fractures are seen. No prevertebral soft tissue swelling. IMPRESSION: 1. There is 3 mm of retrolisthesis of C3 on C4 on extension which reduces upon flexion. 2. Anterior endplate osteophyte fracture at the C6-7 level seen on prior CT is obscured on this examination. Electronically Signed   By: Duanne Guess D.O.   On: 02/20/2023 14:06   MR LUMBAR SPINE WO CONTRAST  Result Date: 02/17/2023 CLINICAL DATA:  Initial evaluation for back pain, fall. EXAM: MRI LUMBAR SPINE WITHOUT CONTRAST TECHNIQUE: Multiplanar, multisequence MR imaging of the lumbar spine was performed. No intravenous contrast was administered. COMPARISON:  None Available. FINDINGS: Segmentation: Transitional features seen about the lumbosacral junction with partial lumbarization of the S1 segment and rudimentary S1-2 interspace. Alignment: Moderate  levoscoliosis with straightening of the normal lumbar lordosis. 3 mm degenerative retrolisthesis of L1 on L2 and L2 on L3. Trace anterolisthesis of L5 on S1. Vertebrae: Vertebral body height maintained without acute or chronic fracture. Bone marrow signal intensity within normal limits. Few scattered benign hemangiomata noted. No worrisome osseous lesions. Mild degenerative reactive endplate changes present about the L2-3 interspace. No other abnormal marrow edema. Conus medullaris and cauda equina: Conus extends to the L2 level. Conus and cauda equina appear normal. Paraspinal and other soft tissues: Paraspinous soft tissues demonstrate no acute finding. Chronic fatty atrophy noted within the posterior paraspinous musculature. Few subcentimeter simple T2 hyperintense renal cysts noted bilaterally, benign in appearance, no follow-up imaging recommended. Cholelithiasis noted. Disc levels: L1-2: Degenerative intervertebral disc space narrowing with diffuse disc bulge. Reactive endplate spurring. Moderate bilateral facet hypertrophy. Resultant mild canal with bilateral lateral recess stenosis. Mild right L1 foraminal narrowing. Left neural foramen remains patent. L2-3: Degenerative intervertebral disc space narrowing with diffuse disc bulge and reactive endplate spurring, worse on the right. Moderate bilateral facet hypertrophy. Resultant moderate canal with moderate right worse than left lateral recess stenosis. Moderate right L2 foraminal narrowing. Left neural foramen remains patent. L3-4: Degenerative vertebral disc space narrowing with diffuse disc bulge and reactive endplate spurring, worse on the right. Superimposed central to left subarticular disc osteophyte indents the ventral thecal sac. Mild facet and ligament flavum hypertrophy. Resultant moderate spinal stenosis. Mild right L3 foraminal narrowing. Left neural foramen remains patent. L4-5: Disc desiccation with reactive endplate spurring, worse on the  right. Moderate bilateral facet hypertrophy. Resultant mild canal with mild left greater than right lateral recess stenosis. Mild bilateral L4 foraminal narrowing. L5-S1: Disc desiccation with diffuse disc bulge. Reactive endplate spurring, worse on the left. Central disc protrusion indents the ventral thecal sac. Mild to moderate bilateral facet hypertrophy. Resultant mild narrowing of the right lateral  recess. Central canal remains patent. Mild right with mild-to-moderate left L5 foraminal stenosis. S1-2: Disc desiccation with annular fissuring. Rehmann central to right subarticular disc protrusion closely approximates the descending right S2 nerve root. Mild facet hypertrophy bilaterally. No significant spinal stenosis. Foramina remain patent. IMPRESSION: 1. No acute traumatic injury within the lumbar spine. 2. Multilevel lumbar spondylosis with resultant mild to moderate canal and bilateral lateral recess stenosis at L1-2 through L3-4. Associated mild to moderate multilevel foraminal narrowing as detailed above. 3. Central to right subarticular disc protrusion at S1-2, closely approximating and potentially affecting the descending right S2 nerve root. 4. Transitional lumbosacral anatomy. Careful correlation with numbering system on this exam recommended prior to any potential future intervention. 5. Cholelithiasis. Electronically Signed   By: Rise Mu M.D.   On: 02/17/2023 23:01   MR THORACIC SPINE WO CONTRAST  Result Date: 02/17/2023 CLINICAL DATA:  Initial evaluation for mid back pain, fall. EXAM: MRI THORACIC SPINE WITHOUT CONTRAST TECHNIQUE: Multiplanar, multisequence MR imaging of the thoracic spine was performed. No intravenous contrast was administered. COMPARISON:  None Available. FINDINGS: Alignment: Straightening of the normal thoracic kyphosis with underlying trace levoscoliosis. Mild grade 1 degenerative anterolisthesis of T2 on T3 and T3 on T4. Trace degenerative retrolisthesis of T12  on L1. Vertebrae: Vertebral body height maintained without acute or chronic fracture. Bone marrow signal intensity within normal limits. Few scattered benign hemangiomata noted. No worrisome osseous lesions. No abnormal marrow edema. Cord: Question subtle patchy cord signal changes involving the central and right cord at the level of T9-10 (series 20, image 28). Otherwise normal signal morphology. No other convincing cord signal changes. Paraspinal and other soft tissues: Paraspinous soft tissues within normal limits. 2 cm stone noted within the gallbladder. Disc levels: T1-2: Diffuse disc bulge with uncovertebral spurring. Mild facet hypertrophy. No significant spinal stenosis. Foramina remain patent. T2-3: Diffuse disc bulge with endplate spurring. Superimposed Paar central disc protrusion indents the ventral thecal sac. Right-sided facet degeneration. No significant spinal stenosis. Mild to moderate right foraminal narrowing. Left neural foramina remains patent. T3-4: Debes central disc protrusion indents the ventral thecal sac. Right worse than left facet hypertrophy. No significant spinal stenosis. Mild to moderate bilateral foraminal narrowing. T4-5: Thresher right paracentral disc protrusion indents the ventral thecal sac. Mild posterior element hypertrophy. No significant spinal stenosis. Mild right foraminal narrowing. T5-6: Right paracentral disc protrusion indents the ventral thecal sac. Minimal cord flattening without cord signal changes. Mild facet hypertrophy. Resultant mild spinal stenosis. Mild left foraminal narrowing. T6-7: Central to right paracentral disc protrusion indents the ventral thecal sac. Minimal cord flattening without cord signal changes. Mild spinal stenosis. Foramina remain patent. T7-8: Central to left paracentral disc protrusion indents the ventral thecal sac, contacting and mildly flattening the ventral cord. No visible cord signal changes. Mild posterior element hypertrophy.  Moderate spinal stenosis. Foramina remain patent. T8-9: Central disc protrusion indents the ventral thecal sac. Mild posterior element hypertrophy. No significant spinal stenosis. Foramina remain patent. T9-10: Degenerative vertebral disc space narrowing with diffuse disc bulge and endplate spurring. Superimposed right paracentral disc protrusion indents the right ventral thecal sac, contacting and mildly flattening the right ventral cord. Question of subtle patchy cord signal changes at this level. Superimposed posterior element hypertrophy. Moderate spinal stenosis. Mild to moderate bilateral foraminal narrowing. T10-11: Diffuse disc bulge with endplate and uncovertebral spurring. Bilateral facet hypertrophy. Resultant mild-to-moderate spinal stenosis. Moderate bilateral foraminal narrowing. T11-12: Diffuse disc bulge with endplate and uncovertebral spurring, asymmetric to the left. Moderate bilateral  facet hypertrophy. Resultant moderate to severe spinal stenosis (series 20, image 34). Secondary cord flattening without convincing cord signal changes. Severe left with moderate right foraminal stenosis. T12-L1: Diffuse disc bulge with endplate spurring, asymmetric to the left. Superimposed Lema left subarticular disc extrusion with inferior migration. Mild facet hypertrophy. Resultant mild spinal stenosis. Mild to moderate left with mild right foraminal stenosis. IMPRESSION: 1. No acute osseous abnormality within the thoracic spine. 2. Multilevel thoracic spondylosis with resultant diffuse spinal stenosis as detailed above, greatest at T9-10 and T11-12. Question subtle patchy cord signal changes at the T9-10 level, suspicious for compressive myelomalacia. 3. Multilevel foraminal narrowing related to disc bulge, endplate spurring, and facet hypertrophy as detailed above, most pronounced at T11-12 on the left. 4. Cholelithiasis. Electronically Signed   By: Rise Mu M.D.   On: 02/17/2023 22:47   MR  Cervical Spine Wo Contrast  Result Date: 02/17/2023 CLINICAL DATA:  Initial evaluation for acute myelopathy. EXAM: MRI CERVICAL SPINE WITHOUT CONTRAST TECHNIQUE: Multiplanar, multisequence MR imaging of the cervical spine was performed. No intravenous contrast was administered. COMPARISON:  None Available. FINDINGS: Alignment: Straightening of the normal cervical lordosis. Trace degenerative retrolisthesis of C3 on C4, with trace anterolisthesis of C7 on T1. Vertebrae: Vertebral body height maintained without acute or chronic fracture. Bone marrow signal intensity within normal limits. Jha benign hemangioma noted within the C7 vertebral body. No worrisome osseous lesions. No abnormal marrow edema. Cord: Normal signal and morphology. Posterior Fossa, vertebral arteries, paraspinal tissues: Unremarkable. Disc levels: C2-C3: Minimal disc bulge with endplate spurring. Mild left-sided facet hypertrophy. No significant spinal stenosis. Foramina remain patent. C3-C4: Degenerative intervertebral disc space narrowing with diffuse disc osteophyte. Flattening and partial effacement of the ventral thecal sac. Superimposed mild facet hypertrophy. Resultant moderate spinal stenosis with severe bilateral C4 foraminal narrowing. C4-C5: Degenerative intervertebral disc space narrowing with diffuse disc osteophyte complex. Posterior component flattens and partially faces the ventral thecal sac. Moderate right facet hypertrophy. Resultant mild-to-moderate spinal stenosis. Severe right with moderate left C5 foraminal stenosis. C5-C6: Degenerative intervertebral disc space narrowing with diffuse disc bulge and bilateral uncovertebral spurring. Flattening of the ventral thecal sac with no more than mild spinal stenosis. Moderate to severe bilateral C6 foraminal narrowing. C6-C7: Degenerate intervertebral disc space narrowing with circumferential disc osteophyte complex. Flattening and partial effacement of the ventral thecal sac  with mild cord flattening, but no cord signal changes. Mild spinal stenosis. Moderate bilateral C7 foraminal narrowing. C7-T1: Trace anterolisthesis. No significant disc bulge. Moderate right facet hypertrophy. No spinal stenosis. Foramina remain patent. IMPRESSION: 1. Normal MRI appearance of the cervical spinal cord. No cord signal changes to suggest myelopathy. 2. Multilevel cervical spondylosis with resultant mild to moderate diffuse spinal stenosis at C3-4 through C6-7. 3. Multifactorial degenerative changes with resultant multilevel foraminal narrowing as above. Notable findings include severe bilateral C4 foraminal stenosis, severe right with moderate left C5 foraminal narrowing, moderate to severe bilateral C6 foraminal stenosis, with moderate bilateral C7 foraminal narrowing. Electronically Signed   By: Rise Mu M.D.   On: 02/17/2023 22:36   DG Chest Portable 1 View  Result Date: 02/17/2023 CLINICAL DATA:  Larey Seat getting out of a chair. Pain to lower back and both legs. Abrasion to left side of forehead. On Eliquis. EXAM: PORTABLE CHEST 1 VIEW COMPARISON:  09/16/2022 FINDINGS: Stable cardiomediastinal silhouette. Aortic atherosclerotic calcification. No focal consolidation, pleural effusion, or pneumothorax. No displaced rib fractures. IMPRESSION: No active disease. Electronically Signed   By: Minerva Fester M.D.   On:  02/17/2023 19:19   CT HEAD WO CONTRAST  Result Date: 02/17/2023 CLINICAL DATA:  Provided history: Head trauma, minor. Neck pain, no cervical tenderness. Additional history provided: fall, low back pain, leg pain, forehead abrasion, on Eliquis. EXAM: CT HEAD WITHOUT CONTRAST CT CERVICAL SPINE WITHOUT CONTRAST TECHNIQUE: Multidetector CT imaging of the head and cervical spine was performed following the standard protocol without intravenous contrast. Multiplanar CT image reconstructions of the cervical spine were also generated. RADIATION DOSE REDUCTION: This exam was  performed according to the departmental dose-optimization program which includes automated exposure control, adjustment of the mA and/or kV according to patient size and/or use of iterative reconstruction technique. COMPARISON:  CT head and CT cervical spine 01/11/2023. FINDINGS: CT HEAD FINDINGS Brain: No age advanced or lobar predominant parenchymal atrophy. 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 There is no acute intracranial hemorrhage. No demarcated cortical infarct. No extra-axial fluid collection. No evidence of an intracranial mass. No midline shift. Vascular: No hyperdense vessel.  Atherosclerotic calcifications. Skull: No calvarial fracture or aggressive osseous lesion. Sinuses/Orbits: No acute finding within the orbits. Elgersma-volume frothy secretions within the bilateral sphenoid sinuses. Other: Ill-defined increased density within the left periorbital soft tissues, likely reflecting sequela of the hematoma and laceration which were present at this site on the prior head CT of 01/11/2023. CT CERVICAL SPINE FINDINGS Alignment: Mild levocurvature of the cervical spine. 2 mm C3-C4 grade 1 retrolisthesis. Slight C4-C5 grade 1 anterolisthesis. 2 mm C5-C6 grade 1 anterolisthesis. 2 mm C7-T1 grade 1 anterolisthesis. Skull base and vertebrae: The basion-dental and atlanto-dental intervals are maintained. Minimally displaced fracture through a C6-C7 ventral osteophyte, new from prior cervical spine CT of 01/11/2023 (for instance as seen on series 6, image 29) (series 7, images 62 and 63). No other cervical spine fracture identified elsewhere. Soft tissues and spinal canal: No prevertebral fluid or swelling. No visible canal hematoma. Disc levels: Cervical spondylosis with multilevel disc space narrowing, disc bulges/central disc protrusions, endplate spurring, uncovertebral hypertrophy and facet arthrosis.  Disc space narrowing is moderate-to-advanced at C3-C4, C4-C5, C5-C6 and C6-C7. Multilevel spinal canal narrowing. Most notably at C3-C4, a partially calcified central disc protrusion contributes to suspected moderate spinal canal stenosis. Multilevel bony neural foraminal narrowing. Multilevel ventrolateral osteophytes, some bridging. Upper chest: No airspace consolidation within the imaged lung apices. No visible pneumothorax. IMPRESSION: CT head: 1. No evidence of an acute intracranial abnormality. 2. Mild chronic [place] vessel ischemic changes within the cerebral white matter. 3. Ill-defined increased density within the left periorbital soft tissues, likely reflecting sequela of the hematoma and laceration which were present at this site on the prior examination of 01/11/2023. 4. Mild bilateral sphenoid sinusitis. CT cervical spine: 1. Minimally displaced fracture through a C6-C7 ventral osteophyte, new from the prior cervical spine CT of 01/11/2023 and likely acute. 2. Mild levocurvature of the cervical spine. 3. Mild grade 1 spondylolisthesis at C3-C4, C4-C5, C5-C6 and C7-T1. 4. Cervical spondylosis as described. Electronically Signed   By: Jackey Loge D.O.   On: 02/17/2023 16:47   CT Cervical Spine Wo Contrast  Result Date: 02/17/2023 CLINICAL DATA:  Provided history: Head trauma, minor. Neck pain, no cervical tenderness. Additional history provided: fall, low back pain, leg pain, forehead abrasion, on Eliquis. EXAM: CT HEAD WITHOUT CONTRAST CT CERVICAL SPINE WITHOUT CONTRAST TECHNIQUE: Multidetector CT imaging of the head and cervical spine was performed following the standard protocol without intravenous contrast. Multiplanar CT image reconstructions  of the cervical spine were also generated. RADIATION DOSE REDUCTION: This exam was performed according to the departmental dose-optimization program which includes automated exposure control, adjustment of the mA and/or kV according to patient size and/or  use of iterative reconstruction technique. COMPARISON:  CT head and CT cervical spine 01/11/2023. FINDINGS: CT HEAD FINDINGS Brain: No age advanced or lobar predominant parenchymal atrophy. Patchy and ill-defined hypoattenuation within the cerebral white matter, nonspecific but compatible with mild chronic [place] vessel ischemic disease. There is no acute intracranial hemorrhage. No demarcated cortical infarct. No extra-axial fluid collection. No evidence of an intracranial mass. No midline shift. Vascular: No hyperdense vessel.  Atherosclerotic calcifications. Skull: No calvarial fracture or aggressive osseous lesion. Sinuses/Orbits: No acute finding within the orbits. Hink-volume frothy secretions within the bilateral sphenoid sinuses. Other: Ill-defined increased density within the left periorbital soft tissues, likely reflecting sequela of the hematoma and laceration which were present at this site on the prior head CT of 01/11/2023. CT CERVICAL SPINE FINDINGS Alignment: Mild levocurvature of the cervical spine. 2 mm C3-C4 grade 1 retrolisthesis. Slight C4-C5 grade 1 anterolisthesis. 2 mm C5-C6 grade 1 anterolisthesis. 2 mm C7-T1 grade 1 anterolisthesis. Skull base and vertebrae: The basion-dental and atlanto-dental intervals are maintained. Minimally displaced fracture through a C6-C7 ventral osteophyte, new from prior cervical spine CT of 01/11/2023 (for instance as seen on series 6, image 29) (series 7, images 62 and 63). No other cervical spine fracture identified elsewhere. Soft tissues and spinal canal: No prevertebral fluid or swelling. No visible canal hematoma. Disc levels: Cervical spondylosis with multilevel disc space narrowing, disc bulges/central disc protrusions, endplate spurring, uncovertebral hypertrophy and facet arthrosis. Disc space narrowing is moderate-to-advanced at C3-C4, C4-C5, C5-C6 and C6-C7. Multilevel spinal canal narrowing. Most notably at C3-C4, a partially calcified central disc  protrusion contributes to suspected moderate spinal canal stenosis. Multilevel bony neural foraminal narrowing. Multilevel ventrolateral osteophytes, some bridging. Upper chest: No airspace consolidation within the imaged lung apices. No visible pneumothorax. IMPRESSION: CT head: 1. No evidence of an acute intracranial abnormality. 2. Mild chronic [place] vessel ischemic changes within the cerebral white matter. 3. Ill-defined increased density within the left periorbital soft tissues, likely reflecting sequela of the hematoma and laceration which were present at this site on the prior examination of 01/11/2023. 4. Mild bilateral sphenoid sinusitis. CT cervical spine: 1. Minimally displaced fracture through a C6-C7 ventral osteophyte, new from the prior cervical spine CT of 01/11/2023 and likely acute. 2. Mild levocurvature of the cervical spine. 3. Mild grade 1 spondylolisthesis at C3-C4, C4-C5, C5-C6 and C7-T1. 4. Cervical spondylosis as described. Electronically Signed   By: Jackey Loge D.O.   On: 02/17/2023 16:47    Microbiology: No results found for this or any previous visit (from the past 240 hour(s)).   Labs: CBC: Recent Labs  Lab 02/17/23 1537 02/18/23 0511 02/19/23 0414 02/20/23 0249 02/24/23 0247  WBC 11.2* 9.4 6.8 6.7 6.1  HGB 13.3 11.8* 11.2* 11.1* 10.9*  HCT 39.3 36.3 33.0* 33.4* 32.7*  MCV 97.8 100.8* 97.1 99.7 99.1  PLT 176 149* 144* 152 226   Basic Metabolic Panel: Recent Labs  Lab 02/17/23 1537 02/17/23 2221 02/18/23 0511 02/19/23 0414 02/20/23 0249 02/21/23 0343 02/22/23 0451 02/24/23 0247  NA 135  --  138 135 134*  --   --  136  K 3.1*  --  3.2* 3.3* 3.2* 4.0 4.2 4.2  CL 90*  --  92* 93* 95*  --   --  107  CO2 33*  --  32 32 29  --   --  21*  GLUCOSE 100*  --  95 98 107*  --   --  101*  BUN 27*  --  25* 23 17  --   --  17  CREATININE 1.11*  --  0.97 1.09* 0.76  --   --  0.82  CALCIUM 8.7*  --  8.5* 8.2* 8.0*  --   --  8.1*  MG  --  1.8 2.0 2.1 2.5*  --   --    --    Liver Function Tests: Recent Labs  Lab 02/18/23 0511  AST 20  ALT 9  ALKPHOS 93  BILITOT 1.0  PROT 6.1*  ALBUMIN 3.3*   No results for input(s): "LIPASE", "AMYLASE" in the last 168 hours. No results for input(s): "AMMONIA" in the last 168 hours. Cardiac Enzymes: No results for input(s): "CKTOTAL", "CKMB", "CKMBINDEX", "TROPONINI" in the last 168 hours. BNP (last 3 results) Recent Labs    09/16/22 1644  BNP 454.5*   CBG: Recent Labs  Lab 02/22/23 1200 02/22/23 1546 02/23/23 0745 02/23/23 1142 02/24/23 0852  GLUCAP 92 137* 90 79 70    Time spent: 35 minutes  Signed:  Gillis Santa  [facility] 02/24/2023 3:06 PM

## 2023-02-24 NOTE — TOC Progression Note (Addendum)
Transition of Care [facility]) - Progression Note    Patient Details  Name: Robin Espinoza MRN: [id number] Date of Birth: 02/18/1946  [facility] CM/SW Contact  Truddie Hidden, RN Phone Number: 02/24/2023, 11:46 AM  Clinical Narrative:    Spoke with patient regarding SNF approval. Patient stated she would like to return home with [facility]. Her spouse will transport her home at time of discharge. MD notified.   3:37pm Patient discharging home [facility] from [facility] notified of discharge home.   TOC signing off.           Expected Discharge Plan and Services                                               Social Determinants of Health (SDOH) Interventions SDOH Screenings   Food Insecurity: No Food Insecurity (02/18/2023)  Housing: Low Risk  (02/18/2023)  Transportation Needs: No Transportation Needs (02/18/2023)  Utilities: Not At Risk (02/18/2023)  Alcohol Screen: Low Risk  (09/12/2021)  Depression (PHQ2-9): Medium Risk (09/12/2021)  Financial Resource Strain: Low Risk  (08/31/2022)   Received from [facility], [facility]  Physical Activity: Inactive (04/04/2020)  Social Connections: Socially Integrated (04/04/2020)  Stress: No Stress Concern Present (04/04/2020)  Tobacco Use: Medium Risk (02/17/2023)    Readmission Risk Interventions     No data to display

## 2023-02-24 NOTE — Telephone Encounter (Signed)
Patient is still admitted.

## 2023-02-24 NOTE — Progress Notes (Signed)
Patient discharging home. All DC paperwork reviewed and sent with pt. Pt left with all personal belongings with husband via private vehicle

## 2023-02-26 ENCOUNTER — Other Ambulatory Visit: Payer: Self-pay | Admitting: Family Medicine

## 2023-02-26 DIAGNOSIS — K219 Gastro-esophageal reflux disease without esophagitis: Secondary | ICD-10-CM

## 2023-02-26 NOTE — Telephone Encounter (Signed)
Left message to call back  

## 2023-03-01 NOTE — Telephone Encounter (Signed)
Left message to call for appt

## 2023-03-01 NOTE — Telephone Encounter (Signed)
Pt no longer a pt at our practice Einar Crow MD is at [facility]  Refused Prescriptions Disp Refills   omeprazole (PRILOSEC) 20 MG capsule [Pharmacy Med Name: OMEPRAZOLE DR 20 MG CAPSULE] 90 capsule     Sig: TAKE 1 CAPSULE BY MOUTH EVERY DAY     Gastroenterology: Proton Pump Inhibitors Failed - 02/26/2023 12:02 PM      Failed - Valid encounter within last 12 months    Recent Outpatient Visits           1 year ago Hypertension, unspecified type   [facility] [place], [place], PA-C   1 year ago Bilateral impacted cerumen   [facility] [place], [place], [place]   2 years ago Bilateral impacted cerumen   [facility] Alfredia Ferguson, PA-C   2 years ago Chronic idiopathic constipation   [facility] Merita Norton T, FNP   2 years ago Essential hypertension   [facility] [place], [place], [place]

## 2023-03-02 NOTE — Telephone Encounter (Signed)
She called back and scheduled for 12/16 at 1pm with Dr.Smith.

## 2023-03-03 NOTE — Progress Notes (Unsigned)
Referring Physician:  Lucy Chris, MD [address] [place],  [place] [address]  Primary Physician:  Robin Regulus, MD  History of Present Illness: 03/03/2023 [name] is here today with a chief complaint of ***  Presented to the ER on 02/17/23 after a fall and was found to have a fracture  Wearing Aspen cervical collar?   Duration: *** Location: *** Quality: *** Severity: *** 10/10 Precipitating: aggravated by *** Modifying factors: made better by *** Weakness: none Timing: ***constant Bowel/Bladder Dysfunction: none   Past Surgery: ***no prior spinal surgeries  Robin Espinoza has ***no symptoms of cervical myelopathy.  The symptoms are causing a significant impact on the patient's life.   I have utilized the care everywhere function in epic to review the outside records available from external health systems.  Review of Systems:  A 10 point review of systems is negative, except for the pertinent positives and negatives detailed in the HPI.  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   Procedure: RIGHT TOTAL KNEE ARTHROPLASTY;  Surgeon: Robin Fairly, MD;  Location: [facility] ORS;  Service: Orthopedics;  Laterality: Right;   TUBAL LIGATION  1981    Allergies: Allergies as of 03/08/2023 - Review Complete 02/17/2023  Allergen Reaction Noted   Oxycodone Other (See Comments) 04/04/2020   Amlodipine  Swelling 09/13/2019    Medications:  Current Outpatient Medications:    acetaminophen (TYLENOL) 650 MG CR tablet, Take 1,300 mg by mouth every 8 (eight) hours as needed for pain., Disp: , Rfl:    amiodarone (PACERONE) 200 MG tablet, Take 1 tablet (200 mg total) by mouth daily., Disp: 30 tablet, Rfl: 0   apixaban (ELIQUIS) 5 MG TABS tablet, Take 1 tablet (5 mg total) by mouth 2 (two) times daily., Disp: 60 tablet, Rfl: 0   ARIPiprazole (ABILIFY) 2 MG tablet, Take 1 tablet (2 mg total) by mouth daily after breakfast., Disp: 30 tablet, Rfl: 3   bisacodyl (DULCOLAX) 5 MG EC tablet, Take 2 tablets (10 mg total) by mouth at bedtime., Disp: 60 tablet, Rfl: 2   busPIRone (BUSPAR) 15 MG tablet, Take 15 mg by mouth at bedtime., Disp: , Rfl:    Carboxymethylcellulose Sodium (THERATEARS OP), Place 1 drop into both eyes daily as needed (dry eyes)., Disp: , Rfl:    celecoxib (CELEBREX) 100 MG capsule, Take 1 capsule (100 mg total) by mouth 2 (two) times daily., Disp: 60 capsule, Rfl: 2   DULoxetine (CYMBALTA) 60 MG capsule, Take 1 tablet by mouth daily., Disp: , Rfl:    gabapentin (NEURONTIN) 100 MG capsule, Take 2 capsules (200 mg total) by mouth 2 (two) times daily., Disp: 120 capsule, Rfl: 0   levothyroxine (SYNTHROID) 50 MCG tablet, Take 1 tablet (50 mcg total) by mouth daily at 6 (six) AM.,  Disp: 30 tablet, Rfl: 3   lovastatin (MEVACOR) 40 MG tablet, Take 1 tablet (40 mg total) by mouth at bedtime., Disp: 30 tablet, Rfl: 0   metoprolol succinate (TOPROL-XL) 50 MG 24 hr tablet, Take 1 tablet (50 mg total) by mouth daily. Take with or immediately following a meal., Disp: 60 tablet, Rfl: 0   NON FORMULARY, CPAP, Disp: , Rfl:    nystatin cream (MYCOSTATIN), Apply 1 Application topically 2 (two) times daily as needed., Disp: , Rfl:    omeprazole (PRILOSEC) 20 MG capsule, TAKE 1 CAPSULE BY MOUTH EVERY DAY, Disp: 30 capsule, Rfl: 0   polyethylene glycol (MIRALAX / GLYCOLAX) 17 g packet, Take 17 g by mouth 2  (two) times daily., Disp: 60 packet, Rfl: 2   potassium chloride (KLOR-CON) 10 MEQ tablet, Take 10 mEq by mouth daily., Disp: , Rfl:    torsemide (DEMADEX) 100 MG tablet, Take 100 mg by mouth daily., Disp: , Rfl:    traZODone (DESYREL) 100 MG tablet, Take 1 tablet (100 mg total) by mouth daily., Disp: 30 tablet, Rfl: 0  Social History: Social History   Tobacco Use   Smoking status: Former   Smokeless tobacco: Never   Tobacco comments:    in college  Vaping Use   Vaping status: Never Used  Substance Use Topics   Alcohol use: Yes    Comment: rarely 1 drink   Drug use: No    Family Medical History: Family History  Problem Relation Age of Onset   Diabetes Father    Breast cancer Neg Hx     Physical Examination: There were no vitals filed for this visit.  General: Patient is in no apparent distress. Attention to examination is appropriate.  Neck:   Supple.  Full range of motion.  Respiratory: Patient is breathing without any difficulty.   NEUROLOGICAL:     Awake, alert, oriented to person, place, and time.  Speech is clear and fluent.   Cranial Nerves: Pupils equal round and reactive to light.  Facial tone is symmetric.  Facial sensation is symmetric. Shoulder shrug is symmetric. Tongue protrusion is midline.    Strength: Side Biceps Triceps Deltoid Interossei Grip Wrist Ext. Wrist Flex.  R 5 5 5 5 5 5 5   L 5 5 5 5 5 5 5    Side Iliopsoas Quads Hamstring PF DF EHL  R 5 5 5 5 5 5   L 5 5 5 5 5 5    Reflexes are ***2+ and symmetric at the biceps, triceps, brachioradialis, patella and achilles.   Hoffman's is absent. Clonus is absent  Bilateral upper and lower extremity sensation is intact to light touch ***.     No evidence of dysmetria noted.  Gait is normal.    Imaging: *** I have personally reviewed the images and agree with the above interpretation.  Medical Decision Making/Assessment and Plan: Robin Espinoza is a pleasant 77 y.o. female with ***  There are no  diagnoses linked to this encounter.   Thank you for involving me in the care of this patient.    Lovenia Kim MD/MSCR Neurosurgery

## 2023-03-08 ENCOUNTER — Ambulatory Visit: Payer: PPO | Admitting: Neurosurgery

## 2023-03-15 ENCOUNTER — Ambulatory Visit: Payer: PPO | Admitting: Neurosurgery

## 2023-03-21 ENCOUNTER — Other Ambulatory Visit: Payer: Self-pay | Admitting: Family Medicine

## 2023-03-21 DIAGNOSIS — K219 Gastro-esophageal reflux disease without esophagitis: Secondary | ICD-10-CM

## 2023-03-21 DIAGNOSIS — I10 Essential (primary) hypertension: Secondary | ICD-10-CM

## 2023-03-23 NOTE — Telephone Encounter (Signed)
Please advise 

## 2023-03-24 DIAGNOSIS — G4733 Obstructive sleep apnea (adult) (pediatric): Secondary | ICD-10-CM | POA: Diagnosis not present

## 2023-03-27 DIAGNOSIS — G4733 Obstructive sleep apnea (adult) (pediatric): Secondary | ICD-10-CM | POA: Diagnosis not present

## 2023-03-28 ENCOUNTER — Emergency Department: Payer: PPO

## 2023-03-28 ENCOUNTER — Emergency Department
Admission: EM | Admit: 2023-03-28 | Discharge: 2023-03-28 | Disposition: A | Discharge status: Home / Self Care | Payer: PPO | Attending: Emergency Medicine | Admitting: Emergency Medicine

## 2023-03-28 ENCOUNTER — Other Ambulatory Visit: Payer: Self-pay

## 2023-03-28 DIAGNOSIS — K5900 Constipation, unspecified: Secondary | ICD-10-CM | POA: Insufficient documentation

## 2023-03-28 DIAGNOSIS — R1031 Right lower quadrant pain: Secondary | ICD-10-CM | POA: Diagnosis present

## 2023-03-28 LAB — CBC WITH DIFFERENTIAL/PLATELET
Abs Immature Granulocytes: 0.02 10*3/uL (ref 0.00–0.07)
Basophils Absolute: 0 10*3/uL (ref 0.0–0.1)
Basophils Relative: 1 %
Eosinophils Absolute: 0.1 10*3/uL (ref 0.0–0.5)
Eosinophils Relative: 1 %
HCT: 41.5 % (ref 36.0–46.0)
Hemoglobin: 13.7 g/dL (ref 12.0–15.0)
Immature Granulocytes: 0 %
Lymphocytes Relative: 15 %
Lymphs Abs: 1.2 10*3/uL (ref 0.7–4.0)
MCH: 33.2 pg (ref 26.0–34.0)
MCHC: 33 g/dL (ref 30.0–36.0)
MCV: 100.5 fL — ABNORMAL HIGH (ref 80.0–100.0)
Monocytes Absolute: 0.6 10*3/uL (ref 0.1–1.0)
Monocytes Relative: 7 %
Neutro Abs: 6.5 10*3/uL (ref 1.7–7.7)
Neutrophils Relative %: 76 %
Platelets: 248 10*3/uL (ref 150–400)
RBC: 4.13 MIL/uL (ref 3.87–5.11)
RDW: 13.2 % (ref 11.5–15.5)
WBC: 8.5 10*3/uL (ref 4.0–10.5)
nRBC: 0 % (ref 0.0–0.2)

## 2023-03-28 LAB — COMPREHENSIVE METABOLIC PANEL
ALT: 12 U/L (ref 0–44)
AST: 17 U/L (ref 15–41)
Albumin: 3.8 g/dL (ref 3.5–5.0)
Alkaline Phosphatase: 106 U/L (ref 38–126)
Anion gap: 13 (ref 5–15)
BUN: 24 mg/dL — ABNORMAL HIGH (ref 8–23)
CO2: 28 mmol/L (ref 22–32)
Calcium: 9.1 mg/dL (ref 8.9–10.3)
Chloride: 100 mmol/L (ref 98–111)
Creatinine, Ser: 1.24 mg/dL — ABNORMAL HIGH (ref 0.44–1.00)
GFR, Estimated: 45 mL/min — ABNORMAL LOW (ref >60)
Glucose, Bld: 95 mg/dL (ref 70–99)
Potassium: 3.5 mmol/L (ref 3.5–5.1)
Sodium: 141 mmol/L (ref 135–145)
Total Bilirubin: 0.6 mg/dL (ref 0.0–1.2)
Total Protein: 7.2 g/dL (ref 6.5–8.1)

## 2023-03-28 NOTE — ED Provider Triage Note (Addendum)
 Emergency Medicine Provider Triage Evaluation Note  Robin Espinoza , a 78 y.o. female  was evaluated in triage.  Pt complains of constipation. She has tried taking stool softeners including sennacot and miralax  but this did not help. Last BM 8 days ago. Able to pass flatus. Does feel like she needs to have a BM.   Review of Systems  Positive: Constipation, LLQ pain Negative: N/v/fever  Physical Exam  There were no vitals taken for this visit. Gen:   Awake, no distress   Resp:  Normal effort  MSK:   Moves extremities without difficulty  Other:    Medical Decision Making  Medically screening exam initiated at 1:30 PM.  Appropriate orders placed.  Robin Espinoza was informed that the remainder of the evaluation will be completed by another provider, this initial triage assessment does not replace that evaluation, and the importance of remaining in the ED until their evaluation is complete.     Cleaster Tinnie LABOR, PA-C 03/28/23 1333    Cleaster Tinnie LABOR, PA-C 03/28/23 1334

## 2023-03-28 NOTE — Discharge Instructions (Signed)
 Please use a daily stool softener for the next 2-3 days.  If you have still not had a bowel movement in that time please use the following regimen: Please use MiraLAX  one half capful every hour until your first bowel movement.  Please do not take any MiraLAX  after this for at least 24 hours.  You may use one half capful twice a day of MiraLAX  in order to have 1 solid well-formed bowel movement per day.  You may increase or decrease this dosage as needed to obtain this 1 well-formed bowel movement.  Please make sure that you are drinking at least 8 ounces of water every hour during this initial bowel regimen.

## 2023-03-28 NOTE — ED Provider Notes (Signed)
 [facility] Provider Note   Event Date/Time   First MD Initiated Contact with Patient 03/28/23 [phone]     (approximate) History  Constipation  HPI Robin Espinoza is a 78 y.o. female with a past medical history of chronic constipation who presents complaining of bilateral lower abdominal pain and no bowel movement over the past 8 days.  Patient states that she normally takes a stool softener that she has not been taking over the past week until she began having constipation symptoms again.  Patient states that she has been taking Dulcolax over the last 3 days as well as MiraLAX  2 days ago with no bowel movements. ROS: Patient currently denies any vision changes, tinnitus, difficulty speaking, facial droop, sore throat, chest pain, shortness of breath, nausea/vomiting/diarrhea, dysuria, or weakness/numbness/paresthesias in any extremity   Physical Exam  Triage Vital Signs: ED Triage Vitals  Encounter Vitals Group     BP 03/28/23 1332 (!) 126/109     Systolic BP Percentile --      Diastolic BP Percentile --      Pulse Rate 03/28/23 1332 70     Resp 03/28/23 1332 17     Temp 03/28/23 1334 97.8 F (36.6 C)     Temp Source 03/28/23 1334 Oral     SpO2 03/28/23 1332 100 %     Weight --      Height --      Head Circumference --      Peak Flow --      Pain Score 03/28/23 1333 5     Pain Loc --      Pain Education --      Exclude from Growth Chart --    Most recent vital signs: Vitals:   03/28/23 1830 03/28/23 2042  BP: (!) 159/99 (!) 161/86  Pulse: 67 69  Resp:  18  Temp:  97.8 F (36.6 C)  SpO2: 98% 98%   General: Awake, oriented x4. CV:  Good peripheral perfusion.  Resp:  Normal effort.  Abd:  No distention.  Other:  Elderly obese Caucasian female resting comfortably in no acute distress ED Results / Procedures / Treatments  Labs (all labs ordered are listed, but only abnormal results are displayed) Labs Reviewed  CBC WITH DIFFERENTIAL/PLATELET -  Abnormal; Notable for the following components:      Result Value   MCV 100.5 (*)    All other components within normal limits  COMPREHENSIVE METABOLIC PANEL - Abnormal; Notable for the following components:   BUN 24 (*)    Creatinine, Ser 1.24 (*)    GFR, Estimated 45 (*)    All other components within normal limits   RADIOLOGY ED MD interpretation: 1 view abdominal x-ray independently interpreted and shows no evidence of bowel obstruction or significant constipation. -Agree with radiology assessment Official radiology report(s): DG Abdomen 1 View Result Date: 03/28/2023 CLINICAL DATA:  Constipation.  Eight days since last bowel movement. EXAM: ABDOMEN - 1 VIEW COMPARISON:  CT lumbar spine 09/16/2022 FINDINGS: Two supine views of the abdomen are submitted. There is a normal nonobstructive bowel gas pattern. Stool projects inferior to the symphysis pubis, suggesting a possible rectocele. No supine evidence of free intraperitoneal air. Pelvic calcifications are unchanged, likely phleboliths. There is a rounded calcific density inferior to the left 12th rib, not seen on previous lumbar spine CT, possibly an ingested pill fragment. Moderate degenerative changes throughout the spine associated with a convex left lumbar scoliosis. IMPRESSION: 1. No evidence  of bowel obstruction or significant constipation. 2. Possible rectocele.  Correlate clinically. Electronically Signed   By: Elsie Perone M.D.   On: 03/28/2023 14:30   PROCEDURES: Critical Care performed: No Procedures MEDICATIONS ORDERED IN ED: Medications - No data to display IMPRESSION / MDM / ASSESSMENT AND PLAN / ED COURSE  I reviewed the triage vital signs and the nursing notes.                             The patient is on the cardiac monitor to evaluate for evidence of arrhythmia and/or significant heart rate changes. Patient's presentation is most consistent with acute presentation with potential threat to life or bodily  function. Patient's history and exam most consistent with constipation as an etiology for their pain.  Patient's symptoms not typical for other emergent causes of abdominal pain such as, but not limited to, appendicitis, abdominal aortic aneurysm, pancreatitis, SBO, mesenteric ischemia, serious intra-abdominal bacterial illness.  Patient without red flags concerning for cancer as a constipation etiology.  Rx: Miralax   Disposition:  Patient will be discharged with strict return precautions and follow up with primary MD within 24-48 hours for further evaluation. Patient understands that this still may have an early presentation of an emergent medical condition such as appendicitis that will require a recheck.   FINAL CLINICAL IMPRESSION(S) / ED DIAGNOSES   Final diagnoses:  Constipation, unspecified constipation type   Rx / DC Orders   ED Discharge Orders     None      Note:  This document was prepared using Dragon voice recognition software and may include unintentional dictation errors.   Ison Wichmann K, MD 03/28/23 [phone]

## 2023-03-28 NOTE — ED Triage Notes (Signed)
 Pt reports it has been 8 days since she has had a BM. She reports she has had gas. Pt took miralax at home without success. She has also taken some stool softeners. Pt endorses lower abdominal discomfort.

## 2023-04-02 DIAGNOSIS — G4733 Obstructive sleep apnea (adult) (pediatric): Secondary | ICD-10-CM | POA: Diagnosis not present

## 2023-04-05 DIAGNOSIS — I482 Chronic atrial fibrillation, unspecified: Secondary | ICD-10-CM | POA: Diagnosis not present

## 2023-04-05 DIAGNOSIS — E039 Hypothyroidism, unspecified: Secondary | ICD-10-CM | POA: Diagnosis not present

## 2023-04-05 DIAGNOSIS — F3341 Major depressive disorder, recurrent, in partial remission: Secondary | ICD-10-CM | POA: Diagnosis not present

## 2023-04-05 DIAGNOSIS — N1831 Chronic kidney disease, stage 3a: Secondary | ICD-10-CM | POA: Diagnosis not present

## 2023-04-05 DIAGNOSIS — M4802 Spinal stenosis, cervical region: Secondary | ICD-10-CM | POA: Diagnosis not present

## 2023-04-05 DIAGNOSIS — I5032 Chronic diastolic (congestive) heart failure: Secondary | ICD-10-CM | POA: Diagnosis not present

## 2023-04-05 DIAGNOSIS — F331 Major depressive disorder, recurrent, moderate: Secondary | ICD-10-CM | POA: Diagnosis not present

## 2023-04-12 ENCOUNTER — Ambulatory Visit: Payer: PPO | Admitting: Neurosurgery

## 2023-04-22 DIAGNOSIS — I4892 Unspecified atrial flutter: Secondary | ICD-10-CM | POA: Diagnosis not present

## 2023-04-22 DIAGNOSIS — I480 Paroxysmal atrial fibrillation: Secondary | ICD-10-CM | POA: Diagnosis not present

## 2023-04-22 DIAGNOSIS — S81802D Unspecified open wound, left lower leg, subsequent encounter: Secondary | ICD-10-CM | POA: Diagnosis not present

## 2023-04-22 DIAGNOSIS — F0284 Dementia in other diseases classified elsewhere, unspecified severity, with anxiety: Secondary | ICD-10-CM | POA: Diagnosis not present

## 2023-04-22 DIAGNOSIS — I7143 Infrarenal abdominal aortic aneurysm, without rupture: Secondary | ICD-10-CM | POA: Diagnosis not present

## 2023-04-22 DIAGNOSIS — E038 Other specified hypothyroidism: Secondary | ICD-10-CM | POA: Diagnosis not present

## 2023-04-22 DIAGNOSIS — F0283 Dementia in other diseases classified elsewhere, unspecified severity, with mood disturbance: Secondary | ICD-10-CM | POA: Diagnosis not present

## 2023-04-22 DIAGNOSIS — E538 Deficiency of other specified B group vitamins: Secondary | ICD-10-CM | POA: Diagnosis not present

## 2023-05-04 DIAGNOSIS — G4733 Obstructive sleep apnea (adult) (pediatric): Secondary | ICD-10-CM | POA: Diagnosis not present

## 2023-06-01 DIAGNOSIS — G4733 Obstructive sleep apnea (adult) (pediatric): Secondary | ICD-10-CM | POA: Diagnosis not present

## 2023-07-01 DIAGNOSIS — G4733 Obstructive sleep apnea (adult) (pediatric): Secondary | ICD-10-CM | POA: Diagnosis not present

## 2023-07-02 DIAGNOSIS — G4733 Obstructive sleep apnea (adult) (pediatric): Secondary | ICD-10-CM | POA: Diagnosis not present

## 2023-07-19 DIAGNOSIS — H903 Sensorineural hearing loss, bilateral: Secondary | ICD-10-CM | POA: Diagnosis not present

## 2023-07-19 DIAGNOSIS — H6123 Impacted cerumen, bilateral: Secondary | ICD-10-CM | POA: Diagnosis not present

## 2023-08-01 DIAGNOSIS — G4733 Obstructive sleep apnea (adult) (pediatric): Secondary | ICD-10-CM | POA: Diagnosis not present

## 2023-09-01 DIAGNOSIS — G4733 Obstructive sleep apnea (adult) (pediatric): Secondary | ICD-10-CM | POA: Diagnosis not present

## 2023-10-01 DIAGNOSIS — G4733 Obstructive sleep apnea (adult) (pediatric): Secondary | ICD-10-CM | POA: Diagnosis not present

## 2023-10-18 DIAGNOSIS — Z0000 Encounter for general adult medical examination without abnormal findings: Secondary | ICD-10-CM | POA: Diagnosis not present

## 2023-10-18 DIAGNOSIS — E039 Hypothyroidism, unspecified: Secondary | ICD-10-CM | POA: Diagnosis not present

## 2023-10-18 DIAGNOSIS — Z1331 Encounter for screening for depression: Secondary | ICD-10-CM | POA: Diagnosis not present

## 2023-10-18 DIAGNOSIS — I482 Chronic atrial fibrillation, unspecified: Secondary | ICD-10-CM | POA: Diagnosis not present

## 2023-10-18 DIAGNOSIS — G4733 Obstructive sleep apnea (adult) (pediatric): Secondary | ICD-10-CM | POA: Diagnosis not present

## 2023-10-18 DIAGNOSIS — I5032 Chronic diastolic (congestive) heart failure: Secondary | ICD-10-CM | POA: Diagnosis not present

## 2023-10-18 DIAGNOSIS — M4802 Spinal stenosis, cervical region: Secondary | ICD-10-CM | POA: Diagnosis not present

## 2023-10-18 DIAGNOSIS — R799 Abnormal finding of blood chemistry, unspecified: Secondary | ICD-10-CM | POA: Diagnosis not present

## 2024-01-02 DIAGNOSIS — G4733 Obstructive sleep apnea (adult) (pediatric): Secondary | ICD-10-CM | POA: Diagnosis not present

## 2024-02-21 ENCOUNTER — Ambulatory Visit: Admitting: Urology

## 2024-04-24 ENCOUNTER — Ambulatory Visit: Admitting: Urology
